# Patient Record
Sex: Female | Born: 1940
Health system: Southern US, Community
[De-identification: ages and names within clinical notes are randomized; demographics above are authoritative.]

## PROBLEM LIST (undated history)

## (undated) DIAGNOSIS — I10 Essential (primary) hypertension: Secondary | ICD-10-CM

## (undated) DIAGNOSIS — E7800 Pure hypercholesterolemia, unspecified: Secondary | ICD-10-CM

## (undated) DIAGNOSIS — I639 Cerebral infarction, unspecified: Secondary | ICD-10-CM

## (undated) DIAGNOSIS — F329 Major depressive disorder, single episode, unspecified: Secondary | ICD-10-CM

## (undated) DIAGNOSIS — F32A Depression, unspecified: Secondary | ICD-10-CM

## (undated) DIAGNOSIS — M1990 Unspecified osteoarthritis, unspecified site: Secondary | ICD-10-CM

## (undated) DIAGNOSIS — J189 Pneumonia, unspecified organism: Secondary | ICD-10-CM

## (undated) HISTORY — PX: ABDOMINAL HYSTERECTOMY: SHX81

## (undated) HISTORY — DX: Cerebral infarction, unspecified: I63.9

## (undated) HISTORY — PX: SHOULDER SURGERY: SHX246

## (undated) HISTORY — PX: TONSILLECTOMY: SUR1361

---

## 1998-06-23 ENCOUNTER — Encounter (INDEPENDENT_AMBULATORY_CARE_PROVIDER_SITE_OTHER): Payer: Self-pay | Admitting: *Deleted

## 1998-06-23 LAB — CONVERTED CEMR LAB

## 1998-10-02 ENCOUNTER — Encounter: Payer: Self-pay | Admitting: Internal Medicine

## 1998-10-02 ENCOUNTER — Inpatient Hospital Stay (HOSPITAL_COMMUNITY): Admission: EM | Admit: 1998-10-02 | Discharge: 1998-10-04 | Payer: Self-pay | Admitting: Emergency Medicine

## 1998-10-28 ENCOUNTER — Encounter: Admission: RE | Admit: 1998-10-28 | Discharge: 1998-10-28 | Payer: Self-pay | Admitting: Family Medicine

## 1998-11-25 ENCOUNTER — Encounter: Admission: RE | Admit: 1998-11-25 | Discharge: 1998-11-25 | Payer: Self-pay | Admitting: Family Medicine

## 1998-12-26 ENCOUNTER — Encounter: Admission: RE | Admit: 1998-12-26 | Discharge: 1998-12-26 | Payer: Self-pay | Admitting: Family Medicine

## 1998-12-30 ENCOUNTER — Encounter: Admission: RE | Admit: 1998-12-30 | Discharge: 1998-12-30 | Payer: Self-pay | Admitting: Sports Medicine

## 1999-03-30 ENCOUNTER — Encounter: Admission: RE | Admit: 1999-03-30 | Discharge: 1999-03-30 | Payer: Self-pay | Admitting: Sports Medicine

## 1999-04-29 ENCOUNTER — Encounter: Admission: RE | Admit: 1999-04-29 | Discharge: 1999-04-29 | Payer: Self-pay | Admitting: Family Medicine

## 1999-04-29 ENCOUNTER — Ambulatory Visit (HOSPITAL_COMMUNITY): Admission: RE | Admit: 1999-04-29 | Discharge: 1999-04-29 | Payer: Self-pay | Admitting: Family Medicine

## 1999-06-12 ENCOUNTER — Encounter: Payer: Self-pay | Admitting: Cardiovascular Disease

## 1999-06-12 ENCOUNTER — Ambulatory Visit (HOSPITAL_COMMUNITY): Admission: RE | Admit: 1999-06-12 | Discharge: 1999-06-12 | Payer: Self-pay | Admitting: Cardiovascular Disease

## 1999-06-17 ENCOUNTER — Encounter: Admission: RE | Admit: 1999-06-17 | Discharge: 1999-06-17 | Payer: Self-pay | Admitting: Family Medicine

## 1999-06-23 ENCOUNTER — Encounter: Admission: RE | Admit: 1999-06-23 | Discharge: 1999-06-23 | Payer: Self-pay | Admitting: Sports Medicine

## 1999-07-07 ENCOUNTER — Encounter: Admission: RE | Admit: 1999-07-07 | Discharge: 1999-07-07 | Payer: Self-pay | Admitting: Sports Medicine

## 1999-07-24 ENCOUNTER — Encounter: Admission: RE | Admit: 1999-07-24 | Discharge: 1999-08-17 | Payer: Self-pay | Admitting: *Deleted

## 1999-09-30 ENCOUNTER — Encounter: Admission: RE | Admit: 1999-09-30 | Discharge: 1999-09-30 | Payer: Self-pay | Admitting: Family Medicine

## 1999-10-12 ENCOUNTER — Encounter: Admission: RE | Admit: 1999-10-12 | Discharge: 1999-10-12 | Payer: Self-pay | Admitting: Family Medicine

## 1999-12-01 ENCOUNTER — Encounter: Admission: RE | Admit: 1999-12-01 | Discharge: 1999-12-01 | Payer: Self-pay | Admitting: Family Medicine

## 1999-12-14 ENCOUNTER — Ambulatory Visit (HOSPITAL_COMMUNITY): Admission: RE | Admit: 1999-12-14 | Discharge: 1999-12-14 | Payer: Self-pay | Admitting: *Deleted

## 2000-02-04 ENCOUNTER — Inpatient Hospital Stay (HOSPITAL_COMMUNITY): Admission: RE | Admit: 2000-02-04 | Discharge: 2000-02-05 | Payer: Self-pay | Admitting: *Deleted

## 2000-03-15 ENCOUNTER — Ambulatory Visit (HOSPITAL_COMMUNITY): Admission: RE | Admit: 2000-03-15 | Discharge: 2000-03-15 | Payer: Self-pay | Admitting: Family Medicine

## 2000-03-15 ENCOUNTER — Encounter: Admission: RE | Admit: 2000-03-15 | Discharge: 2000-03-15 | Payer: Self-pay | Admitting: Family Medicine

## 2000-03-23 ENCOUNTER — Encounter: Admission: RE | Admit: 2000-03-23 | Discharge: 2000-03-23 | Payer: Self-pay | Admitting: Family Medicine

## 2000-04-07 ENCOUNTER — Encounter: Admission: RE | Admit: 2000-04-07 | Discharge: 2000-04-07 | Payer: Self-pay | Admitting: Family Medicine

## 2000-12-28 ENCOUNTER — Encounter: Admission: RE | Admit: 2000-12-28 | Discharge: 2000-12-28 | Payer: Self-pay | Admitting: Family Medicine

## 2001-01-17 ENCOUNTER — Encounter: Payer: Self-pay | Admitting: Sports Medicine

## 2001-01-17 ENCOUNTER — Encounter: Admission: RE | Admit: 2001-01-17 | Discharge: 2001-01-17 | Payer: Self-pay | Admitting: Sports Medicine

## 2001-02-01 ENCOUNTER — Encounter: Admission: RE | Admit: 2001-02-01 | Discharge: 2001-02-01 | Payer: Self-pay | Admitting: Family Medicine

## 2001-02-03 ENCOUNTER — Encounter: Payer: Self-pay | Admitting: Sports Medicine

## 2001-02-03 ENCOUNTER — Encounter: Admission: RE | Admit: 2001-02-03 | Discharge: 2001-02-03 | Payer: Self-pay | Admitting: Sports Medicine

## 2001-03-10 ENCOUNTER — Encounter: Admission: RE | Admit: 2001-03-10 | Discharge: 2001-03-10 | Payer: Self-pay | Admitting: Family Medicine

## 2001-05-01 ENCOUNTER — Encounter: Admission: RE | Admit: 2001-05-01 | Discharge: 2001-05-01 | Payer: Self-pay | Admitting: Family Medicine

## 2001-05-15 ENCOUNTER — Encounter: Admission: RE | Admit: 2001-05-15 | Discharge: 2001-05-15 | Payer: Self-pay | Admitting: Sports Medicine

## 2001-07-21 ENCOUNTER — Encounter: Admission: RE | Admit: 2001-07-21 | Discharge: 2001-07-21 | Payer: Self-pay | Admitting: Family Medicine

## 2001-11-14 ENCOUNTER — Encounter: Admission: RE | Admit: 2001-11-14 | Discharge: 2001-11-14 | Payer: Self-pay | Admitting: Family Medicine

## 2001-12-18 ENCOUNTER — Encounter: Admission: RE | Admit: 2001-12-18 | Discharge: 2001-12-18 | Payer: Self-pay | Admitting: Pediatrics

## 2001-12-28 ENCOUNTER — Encounter: Admission: RE | Admit: 2001-12-28 | Discharge: 2001-12-28 | Payer: Self-pay | Admitting: Family Medicine

## 2002-03-05 ENCOUNTER — Encounter: Admission: RE | Admit: 2002-03-05 | Discharge: 2002-03-05 | Payer: Self-pay | Admitting: Family Medicine

## 2002-04-17 ENCOUNTER — Encounter: Admission: RE | Admit: 2002-04-17 | Discharge: 2002-04-17 | Payer: Self-pay | Admitting: Family Medicine

## 2002-04-24 ENCOUNTER — Encounter: Admission: RE | Admit: 2002-04-24 | Discharge: 2002-04-24 | Payer: Self-pay | Admitting: Sports Medicine

## 2002-04-24 ENCOUNTER — Encounter: Payer: Self-pay | Admitting: Sports Medicine

## 2002-05-18 ENCOUNTER — Encounter: Admission: RE | Admit: 2002-05-18 | Discharge: 2002-05-18 | Payer: Self-pay | Admitting: Family Medicine

## 2002-07-19 ENCOUNTER — Encounter: Admission: RE | Admit: 2002-07-19 | Discharge: 2002-07-19 | Payer: Self-pay | Admitting: Family Medicine

## 2002-07-23 ENCOUNTER — Encounter: Payer: Self-pay | Admitting: Sports Medicine

## 2002-07-23 ENCOUNTER — Encounter: Admission: RE | Admit: 2002-07-23 | Discharge: 2002-07-23 | Payer: Self-pay | Admitting: Sports Medicine

## 2002-07-26 ENCOUNTER — Ambulatory Visit (HOSPITAL_COMMUNITY): Admission: RE | Admit: 2002-07-26 | Discharge: 2002-07-26 | Payer: Self-pay | Admitting: Sports Medicine

## 2002-08-16 ENCOUNTER — Encounter: Admission: RE | Admit: 2002-08-16 | Discharge: 2002-08-16 | Payer: Self-pay | Admitting: Family Medicine

## 2002-10-17 ENCOUNTER — Encounter: Admission: RE | Admit: 2002-10-17 | Discharge: 2002-10-17 | Payer: Self-pay | Admitting: Family Medicine

## 2002-11-13 ENCOUNTER — Encounter: Admission: RE | Admit: 2002-11-13 | Discharge: 2002-11-13 | Payer: Self-pay | Admitting: Family Medicine

## 2002-11-19 ENCOUNTER — Encounter: Admission: RE | Admit: 2002-11-19 | Discharge: 2002-11-19 | Payer: Self-pay | Admitting: Family Medicine

## 2002-12-20 ENCOUNTER — Encounter: Admission: RE | Admit: 2002-12-20 | Discharge: 2002-12-20 | Payer: Self-pay | Admitting: Family Medicine

## 2003-01-22 ENCOUNTER — Encounter: Admission: RE | Admit: 2003-01-22 | Discharge: 2003-01-22 | Payer: Self-pay | Admitting: Sports Medicine

## 2003-03-21 ENCOUNTER — Emergency Department (HOSPITAL_COMMUNITY): Admission: EM | Admit: 2003-03-21 | Discharge: 2003-03-21 | Payer: Self-pay | Admitting: Family Medicine

## 2003-03-21 ENCOUNTER — Encounter: Admission: RE | Admit: 2003-03-21 | Discharge: 2003-03-21 | Payer: Self-pay | Admitting: Sports Medicine

## 2003-06-26 ENCOUNTER — Encounter: Admission: RE | Admit: 2003-06-26 | Discharge: 2003-06-26 | Payer: Self-pay | Admitting: Family Medicine

## 2003-07-17 ENCOUNTER — Encounter: Admission: RE | Admit: 2003-07-17 | Discharge: 2003-07-17 | Payer: Self-pay | Admitting: Sports Medicine

## 2003-09-16 ENCOUNTER — Ambulatory Visit (HOSPITAL_COMMUNITY): Admission: RE | Admit: 2003-09-16 | Discharge: 2003-09-16 | Payer: Self-pay | Admitting: Gastroenterology

## 2003-09-16 ENCOUNTER — Encounter (INDEPENDENT_AMBULATORY_CARE_PROVIDER_SITE_OTHER): Payer: Self-pay | Admitting: *Deleted

## 2003-10-16 ENCOUNTER — Encounter: Admission: RE | Admit: 2003-10-16 | Discharge: 2003-10-16 | Payer: Self-pay | Admitting: Family Medicine

## 2003-11-17 ENCOUNTER — Emergency Department (HOSPITAL_COMMUNITY): Admission: EM | Admit: 2003-11-17 | Discharge: 2003-11-17 | Payer: Self-pay | Admitting: Family Medicine

## 2004-01-27 ENCOUNTER — Ambulatory Visit: Payer: Self-pay | Admitting: Sports Medicine

## 2004-06-29 ENCOUNTER — Ambulatory Visit: Payer: Self-pay | Admitting: Family Medicine

## 2004-07-29 ENCOUNTER — Ambulatory Visit: Payer: Self-pay | Admitting: Family Medicine

## 2004-07-31 ENCOUNTER — Encounter: Admission: RE | Admit: 2004-07-31 | Discharge: 2004-07-31 | Payer: Self-pay | Admitting: Sports Medicine

## 2005-03-05 ENCOUNTER — Ambulatory Visit (HOSPITAL_COMMUNITY): Admission: RE | Admit: 2005-03-05 | Discharge: 2005-03-05 | Payer: Self-pay | Admitting: Internal Medicine

## 2005-09-02 ENCOUNTER — Encounter: Admission: RE | Admit: 2005-09-02 | Discharge: 2005-09-02 | Payer: Self-pay | Admitting: Internal Medicine

## 2005-10-22 ENCOUNTER — Encounter: Admission: RE | Admit: 2005-10-22 | Discharge: 2005-10-22 | Payer: Self-pay | Admitting: Internal Medicine

## 2006-04-21 DIAGNOSIS — M25549 Pain in joints of unspecified hand: Secondary | ICD-10-CM | POA: Insufficient documentation

## 2006-04-21 DIAGNOSIS — F339 Major depressive disorder, recurrent, unspecified: Secondary | ICD-10-CM | POA: Insufficient documentation

## 2006-04-21 DIAGNOSIS — I10 Essential (primary) hypertension: Secondary | ICD-10-CM | POA: Insufficient documentation

## 2006-04-21 DIAGNOSIS — E7800 Pure hypercholesterolemia, unspecified: Secondary | ICD-10-CM | POA: Insufficient documentation

## 2006-04-21 DIAGNOSIS — F17200 Nicotine dependence, unspecified, uncomplicated: Secondary | ICD-10-CM | POA: Insufficient documentation

## 2006-04-21 DIAGNOSIS — E669 Obesity, unspecified: Secondary | ICD-10-CM | POA: Insufficient documentation

## 2006-04-21 DIAGNOSIS — I161 Hypertensive emergency: Secondary | ICD-10-CM | POA: Insufficient documentation

## 2006-04-22 ENCOUNTER — Encounter (INDEPENDENT_AMBULATORY_CARE_PROVIDER_SITE_OTHER): Payer: Self-pay | Admitting: *Deleted

## 2007-03-24 ENCOUNTER — Ambulatory Visit: Payer: Self-pay | Admitting: Vascular Surgery

## 2007-08-15 ENCOUNTER — Emergency Department (HOSPITAL_COMMUNITY): Admission: EM | Admit: 2007-08-15 | Discharge: 2007-08-15 | Payer: Self-pay | Admitting: Emergency Medicine

## 2007-12-26 ENCOUNTER — Ambulatory Visit: Payer: Self-pay | Admitting: Vascular Surgery

## 2008-01-08 ENCOUNTER — Ambulatory Visit: Payer: Self-pay | Admitting: Vascular Surgery

## 2008-01-08 ENCOUNTER — Ambulatory Visit (HOSPITAL_COMMUNITY): Admission: RE | Admit: 2008-01-08 | Discharge: 2008-01-08 | Payer: Self-pay | Admitting: Vascular Surgery

## 2008-01-30 ENCOUNTER — Ambulatory Visit: Payer: Self-pay | Admitting: Vascular Surgery

## 2008-03-08 ENCOUNTER — Encounter: Admission: RE | Admit: 2008-03-08 | Discharge: 2008-03-08 | Payer: Self-pay | Admitting: Internal Medicine

## 2008-11-16 ENCOUNTER — Emergency Department (HOSPITAL_COMMUNITY): Admission: EM | Admit: 2008-11-16 | Discharge: 2008-11-16 | Payer: Self-pay | Admitting: Emergency Medicine

## 2008-12-09 ENCOUNTER — Emergency Department (HOSPITAL_BASED_OUTPATIENT_CLINIC_OR_DEPARTMENT_OTHER): Admission: EM | Admit: 2008-12-09 | Discharge: 2008-12-09 | Payer: Self-pay | Admitting: Emergency Medicine

## 2008-12-09 ENCOUNTER — Ambulatory Visit: Payer: Self-pay | Admitting: Radiology

## 2009-02-28 ENCOUNTER — Ambulatory Visit: Payer: Self-pay | Admitting: Vascular Surgery

## 2009-03-03 DIAGNOSIS — R7301 Impaired fasting glucose: Secondary | ICD-10-CM | POA: Insufficient documentation

## 2009-03-03 DIAGNOSIS — E785 Hyperlipidemia, unspecified: Secondary | ICD-10-CM | POA: Insufficient documentation

## 2009-03-10 ENCOUNTER — Encounter: Admission: RE | Admit: 2009-03-10 | Discharge: 2009-03-10 | Payer: Self-pay | Admitting: Internal Medicine

## 2009-10-08 ENCOUNTER — Ambulatory Visit: Payer: Self-pay | Admitting: Vascular Surgery

## 2009-10-20 ENCOUNTER — Ambulatory Visit: Payer: Self-pay | Admitting: Vascular Surgery

## 2009-10-20 ENCOUNTER — Ambulatory Visit (HOSPITAL_COMMUNITY): Admission: RE | Admit: 2009-10-20 | Discharge: 2009-10-20 | Payer: Self-pay | Admitting: Vascular Surgery

## 2009-10-20 HISTORY — PX: OTHER SURGICAL HISTORY: SHX169

## 2010-01-09 DIAGNOSIS — F319 Bipolar disorder, unspecified: Secondary | ICD-10-CM | POA: Insufficient documentation

## 2010-03-11 ENCOUNTER — Ambulatory Visit
Admission: RE | Admit: 2010-03-11 | Discharge: 2010-03-11 | Payer: Self-pay | Source: Home / Self Care | Attending: Vascular Surgery | Admitting: Vascular Surgery

## 2010-03-11 ENCOUNTER — Ambulatory Visit: Admit: 2010-03-11 | Payer: Self-pay | Admitting: Vascular Surgery

## 2010-03-12 ENCOUNTER — Encounter
Admission: RE | Admit: 2010-03-12 | Discharge: 2010-03-12 | Payer: Self-pay | Source: Home / Self Care | Attending: Internal Medicine | Admitting: Internal Medicine

## 2010-03-19 ENCOUNTER — Encounter
Admission: RE | Admit: 2010-03-19 | Discharge: 2010-03-19 | Payer: Self-pay | Source: Home / Self Care | Attending: Internal Medicine | Admitting: Internal Medicine

## 2010-03-20 NOTE — Procedures (Unsigned)
CAROTID DUPLEX EXAM  INDICATION:  Carotid artery disease.  HISTORY: Diabetes:  No Cardiac:  No Hypertension:  Yes Smoking:  Yes Previous Surgery:  No CV History:  The patient is currently asymptomatic. Amaurosis Fugax No, Paresthesias No, Hemiparesis No                                      RIGHT             LEFT Brachial systolic pressure:         139               133 Brachial Doppler waveforms:         WNL               WNL Vertebral direction of flow:        antegrade         antegrade DUPLEX VELOCITIES (cm/sec) CCA peak systolic                   55                86 ECA peak systolic                   85                79 ICA peak systolic                   73                108 ICA end diastolic                   20                35 PLAQUE MORPHOLOGY:                  heterogeneous     heterogeneous PLAQUE AMOUNT:                      moderate          moderate PLAQUE LOCATION:                    CCA to ICA and ECA                  CCA to ICA and ECA  IMPRESSION:  Bilaterally no hemodynamically significant stenosis. Calcific plaques at both proximal internal carotid arteries prevent accurate evaluation; however, waveforms and velocities just distal to acoustic shadow appear within normal limits.  ___________________________________________ Di Kindle. Edilia Bo, M.D.  OD/MEDQ  D:  03/11/2010  T:  03/11/2010  Job:  [id number]

## 2010-04-08 ENCOUNTER — Ambulatory Visit: Payer: Self-pay | Admitting: Vascular Surgery

## 2010-05-07 LAB — POCT I-STAT, CHEM 8
Chloride: 104 mEq/L (ref 96–112)
Creatinine, Ser: 0.8 mg/dL (ref 0.4–1.2)
HCT: 45 % (ref 36.0–46.0)
Hemoglobin: 15.3 g/dL — ABNORMAL HIGH (ref 12.0–15.0)
Potassium: 3.5 mEq/L (ref 3.5–5.1)
Sodium: 144 mEq/L (ref 135–145)

## 2010-05-28 LAB — URINALYSIS, ROUTINE W REFLEX MICROSCOPIC
Bilirubin Urine: NEGATIVE
Glucose, UA: NEGATIVE mg/dL
Ketones, ur: NEGATIVE mg/dL
Nitrite: NEGATIVE
Specific Gravity, Urine: 1.014 (ref 1.005–1.030)

## 2010-05-28 LAB — DIFFERENTIAL
Lymphocytes Relative: 42 % (ref 12–46)
Lymphs Abs: 2.9 10*3/uL (ref 0.7–4.0)
Monocytes Absolute: 0.4 10*3/uL (ref 0.1–1.0)
Monocytes Relative: 6 % (ref 3–12)
Neutrophils Relative %: 51 % (ref 43–77)

## 2010-05-28 LAB — POCT TOXICOLOGY PANEL

## 2010-05-28 LAB — CBC
Platelets: 290 10*3/uL (ref 150–400)
RBC: 5.35 MIL/uL — ABNORMAL HIGH (ref 3.87–5.11)

## 2010-05-28 LAB — BASIC METABOLIC PANEL
CO2: 28 mEq/L (ref 19–32)
Chloride: 105 mEq/L (ref 96–112)
GFR calc Af Amer: >60 mL/min (ref >60)
Sodium: 144 mEq/L (ref 135–145)

## 2010-06-26 DIAGNOSIS — M542 Cervicalgia: Secondary | ICD-10-CM | POA: Insufficient documentation

## 2010-07-07 NOTE — Op Note (Signed)
NAME:  [name], [name]                   ACCOUNT NO.:  [id number]   MEDICAL RECORD NO.:  [id number]          PATIENT TYPE:  AMB   LOCATION:  SDS                          FACILITY:  [facility]   PHYSICIAN:  Di Kindle. Edilia Bo, M.D.DATE OF BIRTH:  01-11-1941   DATE OF PROCEDURE:  01/08/2008  DATE OF DISCHARGE:  01/08/2008                               OPERATIVE REPORT   PREOPERATIVE DIAGNOSIS:  Bilateral lower extremity leg pain with  peripheral vascular disease.   POSTOPERATIVE DIAGNOSIS:  Bilateral lower extremity leg pain with  peripheral vascular disease.   PROCEDURES:  1. Ultrasound-guided access to the right common femoral artery.  2. Aortogram with bilateral lower extremity runoff.   SURGEON:  Di Kindle. Edilia Bo, MD   ANESTHESIA:  Local with sedation.   TECHNIQUE:  The patient was taken to the PV lab, sedated with 1/2 mg of  Versed and 25 mcg of fentanyl.  Both groins were prepped and draped in  usual sterile fashion.  After the skin was infiltrated with 1% lidocaine  and under ultrasound guidance, the right common femoral artery was  cannulated and a guidewire introduced into the infrarenal aorta under  fluoroscopic control.  A 5-French sheath was introduced over the wire.  Pigtail catheter was positioned at the L1 vertebral body and flush  aortogram obtained.  The catheter was then repositioned above the aortic  bifurcation and oblique iliac projections were obtained.  Bilateral  lower extremity runoff films were obtained.   FINDINGS:  There are single renal arteries bilaterally with no  significant renal artery stenosis identified.  There is mild diffuse  disease of the infrarenal aorta but no focal stenosis.   On the right side, the common iliac, external iliac, and hypogastric  arteries are patent.  There is some mild diffuse disease.  In the distal  right external iliac artery, there is some atherosclerotic disease  producing approximately 30% narrowing.  The right  common femoral,  superficial femoral, and deep femoral arteries are patent.  There is  some mild-to-moderate diffuse disease of the distal superficial femoral  artery and proximal popliteal artery.  The below-knee popliteal artery  is patent with 3-vessel runoff on the right via the anterior tibial,  peroneal, and posterior tibial arteries.   On the left side, there is mild diffuse disease of the common iliac and  external iliac artery but no focal stenosis.  The hypogastric artery on  the left is patent.  The common femoral artery has a moderate 30-40%  stenosis on the left.  The deep femoral artery and superficial femoral  artery are patent.  The previously area in the superficial femoral  artery has been stented and there are 2 intrastent stenoses, one of  approximately 70% and one of approximately 90%.  The below-knee  popliteal artery is patent and there is 3-vessel runoff on the left via  the anterior tibial, posterior tibial, and peroneal arteries.   CONCLUSIONS:  1. Mild aortoiliac disease but no focal stenosis identified.  2. Mild superficial femoral artery occlusive disease on the right.  3. Two intragraft stenosis  on the left.      Di Kindle. Edilia Bo, M.D.  Electronically Signed     CSD/MEDQ  D:  01/08/2008  T:  01/08/2008  Job:  [id number]   cc:   Kari Baars, M.D.

## 2010-07-07 NOTE — Assessment & Plan Note (Signed)
OFFICE VISIT   Jellison, Dalphine E  DOB:  Aug 11, 1940                                       10/08/2009  GNFAO#:[id number]   I saw the patient in the office today for continued followup of her  peripheral vascular disease.  I had last seen her in December of 2009.  She had undergone a previous PTA and stenting of the left superficial  femoral artery in 2001 by Dr. [name].  Based on her exam when I last saw  her she had evidence of multilevel arterial occlusive disease.  She  underwent an arteriogram which really did not show any focal aortoiliac  disease amenable to angioplasty.  She had some mild superficial femoral  artery occlusive disease on the right and some mild intragraft stenosis  in the previously stented left superficial femoral artery.  Her  claudication symptoms at that time were improving and we did not proceed  with any intervention.  She comes in now for a followup visit and states  that her claudication symptoms have progressed significantly especially  on the left side.  She experiences claudication in both calves  associated with ambulation and relieved with rest.  This occurs at a  very short distance simply walking around the house.  There are no other  aggravating or alleviating factors.  Her symptoms have gradually  increased over the last year.  She has had no nonhealing ulcers.  She  has had some rest pain in the left foot but no rest pain on the right.   Her past medical history is significant for hypertension and  hypercholesterolemia.  She denies any history of diabetes, history of  previous myocardial infarction, history of congestive heart failure or  history of COPD.   SOCIAL HISTORY:  She is widowed.  She has four children.  She smokes  half a pack per day of cigarettes and has been smoking for 50 years.   FAMILY HISTORY:  There is no history of premature cardiovascular  disease.   REVIEW OF SYSTEMS:  GENERAL:  She has had no  recent weight loss, weight  gain or problems with her appetite.  CARDIOVASCULAR:  She has had no chest pain, chest pressure, palpitations  or arrhythmias.  She does admit to dyspnea on exertion.  She had a  previous stroke in 1994 associated with some mild left-sided weakness.  She has had no history of DVT or phlebitis.  PULMONARY:  She has had wheezing in the past.  MUSCULOSKELETAL:  She does have muscle pain.  ENT:  She has had some loss of hearing recently.  GI, neurologic, hematologic, GU, psychiatric and integumentary review of  systems is unremarkable.   PHYSICAL EXAMINATION:  General:  This is a pleasant 70 year old woman  who appears her stated age.  Vital signs:  Blood pressure is 135/75,  heart rate is 66, saturation 97%.  She is obese.  HEENT:  Unremarkable.  Lungs:  Are clear bilaterally to auscultation without rales, rhonchi or  wheezing.  Cardiovascular:  I do not detect any carotid bruits.  She has  a regular rate and rhythm.  I can palpate a right femoral pulse although  slightly diminished.  I cannot palpate her left femoral pulse.  It is  somewhat difficult to palpate her pulses because of her size.  I cannot  palpate popliteal or  pedal pulses on either side.  She has mild  bilateral extremity swelling.  Abdomen:  Soft and nontender with normal  pitched bowel sounds.  Musculoskeletal:  There are no major deformities  or cyanosis.  Neurologic:  She has no focal weakness or paresthesias.  Skin:  There are no ulcers or rashes.   I have independently interpreted her arterial Doppler study which shows  monophasic signals in the posterior tibial and dorsalis pedis positions  bilaterally with an ABI of 55% on the right and 61% on the left.  I have  compared this to a previous study in January of this year which showed  an ABI of 78% on the right and 72% on the left.   Based on her drop in ankle brachial indices she may have either  developed some proximal aortoiliac  occlusive disease or also progressed  her superficial femoral artery occlusive disease.  Again, the pulse exam  is somewhat challenging because of her size.  Given that her symptoms  have progressed and she has now developed rest pain on the left side I  have recommended we proceed with arteriography to further assess this.  If she has iliac disease amenable to angioplasty this could potentially  be addressed at the same time.  Likewise if her intrastent stenosis on  the left has progressed this could potentially be reballooned and  stented.  I have discussed the indications for arteriography and the  potential complications including but not limited to bleeding, arterial  injury and renal insufficiency.  We have also discussed the indications  and potential complications of angioplasty including but not limited to  bleeding, arterial thrombosis or arterial injury.  All of her questions  were answered and she is agreeable to proceed.  The procedure has been  scheduled for 10/20/2009.  We will make further recommendations pending  these results.  Of note, she is on Plavix.     Di Kindle. Edilia Bo, M.D.  Electronically Signed   CSD/MEDQ  D:  10/08/2009  T:  10/09/2009  Job:  [id number]   cc:   Kari Baars, M.D.

## 2010-07-07 NOTE — Assessment & Plan Note (Signed)
OFFICE VISIT   Mccarrell, Rea E  DOB:  07-19-1940                                       03/11/2010  ZOXWR#:[id number]   I saw patient in the office today for continued follow-up of her  peripheral vascular disease and also carotid disease.  This is a  pleasant 70 year old right-handed woman with a history of peripheral  vascular disease.  I had done a PTA and stenting of a left superficial  femoral artery stenosis back in August 2011 for left lower extremity  claudication.  From that standpoint, her claudication of the left leg  has resolved, and she is doing well.  She has had no claudication or  rest pain.  With respect to her carotid disease, she was sent by Dr.  Clelia Croft for carotid evaluation.  She has a history of a stroke associated  with left hand weakness in 1994, and she states that she has had several  mini strokes involving the left arm since that time.  The most recent  episode was in 2000.  She has had no recent stroke, TIAs, expressive or  receptive aphasia, or amaurosis fugax.  She does occasionally have  problems with unsteady gait but has had no problems with dizziness.   PAST MEDICAL HISTORY:  Significant for hypertension and  hypercholesterolemia.  She denies any history of diabetes, a history of  previous myocardial infarction, or history congestive heart failure.   SOCIAL HISTORY:  She is single.  She has 3 living children.  She smokes  a 1/2 pack per day of cigarettes.   REVIEW OF SYSTEMS:  CARDIOVASCULAR:  She had no chest pain, chest  pressure, palpitations or arrhythmias.  She has had no history of DVT or  phlebitis.  PULMONARY:  She has had no productive cough, bronchitis, asthma, or  wheezing.   PHYSICAL EXAMINATION:  This is a pleasant 70 year old woman who appears  her stated age.  Her blood pressure is 162/83, heart rate is 63,  saturation 97%.  Lungs are clear bilaterally to auscultation except for  some expiratory wheezes.   Cardiovascular:  I do not detect any carotid  bruits.  She has an irregular rate and rhythm.  Because of her size, it  is difficult to feel her femoral pulses; however, she does have a  palpable dorsalis pedis pulse on the left.  I cannot palpate pulses in  the right foot; however, both feet are warm and well-perfused.  She has  no significant lower extremity swelling.  Abdomen is soft and nontender  with normal-pitched bowel sounds.  Musculoskeletal exam has no major  deformities or cyanosis.  Neurologic:  There is no focal weakness or  paresthesias.   I have independently interpreted her carotid duplex scan, which shows no  evidence of significant carotid stenosis bilaterally.  Likewise, both  vertebral arteries are patent with normally directed flow, and arm  pressures are essentially equal.   I have encouraged her to continue to walk as much as possible and  encouraged her to try get off the cigarettes.  I plan on seeing her back  in 1 year with follow-up ABIs, which I have ordered.  We will also  obtain a carotid duplex scan at that time.  If it remains unchanged,  then she probably will not need routine follow-up carotid studies.  She  is  on Plavix, which she will continue.     Di Kindle. Edilia Bo, M.D.  Electronically Signed   CSD/MEDQ  D:  03/11/2010  T:  03/11/2010  Job:  [id number]   cc:   Kari Baars, M.D.

## 2010-07-07 NOTE — Assessment & Plan Note (Signed)
OFFICE VISIT   [name], [name]  DOB:  [birth year]                                       01/30/2008  QMVHQ#:[id number]   I saw the patient for followup after her recent arteriogram.  This is a  pleasant 70 year old woman who had been referred by Dr. Clelia [name] with leg  pain.  She had had a previous PTCA and stenting of the left superficial  femoral artery December of 2001 by Dr. Chales [name].  Based on her exam she had  evidence of multilevel arterial occlusive disease and she underwent an  arteriogram for further evaluation.  This was performed on 01/08/2008.   Her arteriogram demonstrated mild aortoiliac occlusive disease  bilaterally but there was really no focal stenosis amenable to  angioplasty.  She had some mild superficial femoral artery occlusive  disease on the right in the distal thigh and some intragraft stenoses  within her previously stented superficial femoral artery on the left.  She also had some mild plaque in the left common femoral artery.  She  comes in for a followup visit.  She states that her symptoms in her legs  have actually improved somewhat.  She has a stable claudication in both  calves that is brought on by ambulation and relieved with rest.  She has  no significant rest pain.   PHYSICAL EXAMINATION:  There is no hematoma at the right groin where she  had her cath.  She has diminished femoral pulses.  She has monophasic  Doppler signals in both feet.   As her symptoms have actually improved somewhat and she has multilevel  arterial occlusive disease with no simple options for revascularization  we have simply encouraged her to stay as active as possible and try to  get on a structured walking program.  We have again discussed the  importance of tobacco cessation.  I plan on seeing her back in 1 year  with followup ABIs.  If her symptoms progressed consideration could be  given to redilating the intragraft stenosis on the left.  She is not  an  ideal candidate for bypass grafting given her obesity.  She will call  sooner if her symptoms progress.   [name]. [name], M.D.  Electronically Signed   CSD/MEDQ  D:  01/30/2008  T:  01/31/2008  Job:  [id number]   cc:   [name], M.D.

## 2010-07-07 NOTE — Assessment & Plan Note (Signed)
OFFICE VISIT   [name], [name]  DOB:  Jan 27, 1941                                       02/28/2009  ZOXWR#:[id number]   REFERRING PHYSICIAN:  Dr. Clelia Croft.   PRIMARY CARE PHYSICIAN:  Dr. Clelia Croft.   The patient returns to clinic for followup of peripheral vascular  disease.  She underwent an angiogram 01/30/2008 and then now returns for  a yearly visit.   PAST MEDICAL HISTORY:  Is significant for hypertension.  She continues  to use 1-1/2 packs of tobacco daily.   REVIEW OF SYSTEMS:  Is significant for pain in legs when walking and  some mini strokes in the past.  Depression and some hearing loss.  She  is also complaining of frequency with urination.   PHYSICAL FINDINGS:  General:  Revealed a generally well-nourished woman  in no apparent distress.  Vital signs:  Heart rate was 61, blood  pressure 145/83, O2 sat was 97%.  Temperature was 98.7.  HEENT:  NCAT,  PERRLA, EOMI.  Mucous membranes were pink and moist.  Lungs:  Clear to  auscultation.  Cardiac:  Exam revealed a regular rate and rhythm.  Abdomen:  Was soft, nontender, nondistended.  Musculoskeletal:  Exam  demonstrated no major deformities.  Skin:  The evaluation of her skin  demonstrated no ulcers or rashes.   ABIs were relatively unchanged from previous.  They demonstrated mild to  moderate arterial disease.   Her hypertension and hypercholesterolemia remain stable.  This is  followed by Dr. Clelia Croft.  Her evaluation of her vasculature by noninvasive  studies demonstrates little change.  We will ask her to return in 1 year  for repeat studies.   Wilmon Arms, PA   Larina Earthly, M.D.  Electronically Signed   KEL/MEDQ  D:  02/28/2009  T:  03/01/2009  Job:  [id number]

## 2010-07-07 NOTE — Consult Note (Signed)
VASCULAR SURGERY CONSULTATION   [name], Barbara E  DOB:  1940-07-29                                       12/26/2007  [id number]#:[id number]   The patient was referred by Dr. Martha Clan.  I saw the patient in the  office today in consultation concerning her bilateral lower extremity  pain.  She was referred by Dr. Clelia Croft.  This is a pleasant 70 year old  woman who states that she has been having pain in both legs associated  with ambulation and relieved with rest since 2000.  She experiences  significant left calf claudication and also to a lesser degree right  calf claudication essentially with ambulation and relieved with rest.  This occurs at a very short distance.  She states over the last 2-3  months her symptoms have gradually worsened.  She also has had some  right hip pain although this occurs with sitting and I think it is  unrelated to her peripheral vascular disease.  She denies any history of  rest pain and has had no history of nonhealing ulcers.  She tells me  that she can barely walk 10 yards before experiencing calf claudication,  however.  She did, according to the computer, undergo an arteriogram and  PTCA of a left superficial femoral artery stenosis back in December of  2001 by Dr. Veneda Melter.   PAST MEDICAL HISTORY:  Her past medical history is significant for  hypertension, hypercholesterolemia.  She denies any history of diabetes,  history of previous myocardial infarction, history of congestive heart  failure or history of COPD.   FAMILY HISTORY:  Her mother had coronary artery disease at approximately  68 years of age.  She had two sisters who have problems with clots.  She is unaware of any other history of premature cardiovascular disease.   SOCIAL HISTORY:  She is widowed.  She has three children.  She works as  a Agricultural consultant.  She smokes half pack per day of cigarettes and has been  smoking for 40 years.   REVIEW OF SYSTEMS AND MEDICATIONS:   Are documented on the medical  history form in her chart.  Of note, she is on Plavix.   PHYSICAL EXAMINATION:  General:  This is a pleasant 70 year old woman  who appears her stated age.  Vital signs:  Temperature is 98.4, blood  pressure 135/83, heart rate 94.  Neck:  Supple.  There is no cervical  lymphadenopathy.  I do not detect any carotid bruits.  Lungs:  Clear  bilaterally to auscultation.  Cardiac:  She has a regular rate and  rhythm.  Abdomen:  Soft and nontender.  I cannot palpate an aneurysm.  She has normal pitched bowel sounds.  She has diminished but palpable  femoral pulses bilaterally.  She is morbidly obese.  I cannot palpate  popliteal or pedal pulses on either side.  She has monophasic Doppler  signals in both feet.  She did have a Doppler study at an outlying  institution which showed an ABI of 68% on the right and 70% on the left  in the posterior tibial position bilaterally.  She has no ischemic  ulcers.  She has mild bilateral lower extremity swelling.  Neurological:  Exam is nonfocal.   Based on her Doppler waveforms from her outside study and her exam I  think she does  have evidence of bilateral iliac artery occlusive  disease.  I suspect that her superficial femoral artery and stent on the  left is also occluded.  Given that her symptoms are quite disabling and  she can walk only a very short distance before experiencing significant  calf pain I have recommended we proceed with arteriography to see what  options she might have for revascularization.  I have explained if we  find some iliac stenosis amenable to angioplasty this could potentially  be addressed at the same time.  I have discussed the indications for  arteriography and the potential complications including but not limited  to bleeding, arterial injury,.  We have also discussed the potential  complications of angioplasty including the risk of arterial injury,  bleeding or thrombosis.  All of her  questions were answered and she is  agreeable to proceed.  She will continue her Plavix right up through the  procedure.  Her arteriogram has been scheduled for November 16.   Di Kindle. Edilia Bo, M.D.  Electronically Signed  CSD/MEDQ  D:  12/26/2007  T:  12/27/2007  Job:  [id number]   cc:   Kari Baars, M.D.

## 2010-07-10 NOTE — Discharge Summary (Signed)
[place]  Patient:    [name], [name]                          MRN: [id number] Adm. Date:  [id number] Disc. Date: 02/05/00 Attending:  Veneda Melter Dictator:   Joellyn Rued, P.A.C. CC:         Talmage Nap, M.D.   Discharge Summary  DATE OF BIRTH:  Jul 15, 1940  HISTORY OF PRESENT ILLNESS:  [name] is a 70 year old black female who was referred to Dr. [name] on January 19, 2000, for evaluation of lower extremity discomfort.  She describes a two-year history of posterior calf pain in the left leg occurring with ambulation.  Minimal activity will bring severe discomfort and has severely impacted her activities of daily living. Discomfort would be relieved by rest.  She has also had a history of intermittent chest discomfort with an occasional sublingual nitroglycerin use. Catheterization was performed prior to this year, however, Dr. [name] does not have those results.  She has a history of stroke in 1993 with mild left-sided weakness, several TIAs, hypertension, obesity.  Dr. [name] performed ABIs on October 22 and on the right it was 0.95 and on the left 0.67.  Thus she was admitted for arteriogram.  LABORATORY DATA:  Sodium 144, potassium 141, BUN 10, creatinine 1.1.  PT 11.3, PTT 26.8.  H&H 41.4, 13.1, MCHC slightly low at 31.7.  Platelets 345, WBC 7.4.  HOSPITAL COURSE:  On December 12, Dr. [name] performed aortogram and lower extremity arteriogram via the right femoral artery.  According to his progress notes, this showed 100% mid distal left FSA.  This is reduced to 10% utilizing stenting.  Postprocedure, she was having significant thigh discomfort and nausea and vomiting.  Thus, she remained in the hospital overnight for observation.  By December 14, she was ambulating without difficulty, catheterization site was intact, and Dr. [name] felt that she could be discharged home.  DISCHARGE DIAGNOSIS:  Peripheral vascular disease,  claudication, status post angioplasty and stenting to the left FSA.  DISPOSITION:  She is discharged home.  DISCHARGE MEDICATIONS: 1. Plavix 75 mg q.d. 2. Norvasc 10 mg q.d. 3. Aspirin 325 mg q.d. 4. Prozac 20 mg b.i.d. 5. Amitriptyline 50 mg q.d. 6. Altace 5 mg q.d. 7. Sublingual nitroglycerin as needed.  DISCHARGE INSTRUCTIONS:  She is instructed to go by the office to pick up samples of the Altace.  She was instructed not to take both Altace and Prinivil.  She was advised no lifting, driving, sexual activity, or heavy exertion for two days.  Maintain low salt, fat, and cholesterol diet.  If she had any problems with her catheterization site, she was asked to call immediately.  She will see Lavella Hammock, P.A. in the office on January 8, at 2 p.m. for groin check and follow-up and Dr. [name] in three months.  It is noted that she should continue on her Plavix due to a TIA standpoint that this should not be discontinued at four weeks as usually done with stenting. DD:  02/05/00 TD:  02/05/00 Job: [id number]

## 2010-07-10 NOTE — Op Note (Signed)
[name]  Patient:    [name], [name]                          MRN: [id number] Proc. Date: 02/03/00 Adm. Date:  [id number] Attending:  Veneda Melter CC:         Talmage Nap, M.D., [facility]   Operative Report  PROCEDURES PERFORMED: 1. Abdominal aortogram. 2. Bilateral lower extremity angiogram. 3. Percutaneous transluminal angioplasty and stenting of the left superficial    femoral artery.  DIAGNOSIS:  Peripheral vascular disease with claudication.  HISTORY:  [name] is a 70 year old black female, who presents with a several year history of left lower extremity claudication.  Over the past several months, the pain has progressed to the point, where it is significantly impacting on her activities of daily living.  Ankle brachial indexes on December 14, 1999 were 0.95 on the right and 0.67 on the left.  She presents for lower extremity angiogram.  TECHNIQUE:  Informed consent was obtained from the patient.  She was brought to the catheterization laboratory, a 5-French sheath placed in the right femoral artery and a 5-French pigtail catheter advanced into the descending aorta.  Abdominal aortogram then performed using power injections of contrast and digital subtraction angiography.  Bilateral lower extremity angiogram was then performed, again using a pigtail catheter, power injections of contrast and digital subtraction angiography.  Initial findings are as follows: 1. Abdominal aorta is of normal caliber with mild atheromatous build-up.  The    renal arteries were single and patent bilaterally without significant    obstruction. 2. Right lower extremity.  The right iliac artery has mild narrowing of 30% in    the external segment and common femoral artery has mild irregularities as    well.  The superficial femoral artery has a long segment of disease of 50%    at the junction of the mid and distal  thirds.  The vessel the trifurcation    vessels were patent. There is 3-vessel runoff to the ankle. 3. Left lower extremity.  The left iliac artery has mild diffuse disease.  The    common femoral artery has mild irregularities as well.  The superficial    femoral artery has moderate diffuse disease in the proximal and mid    sections of 30%.  There is then a segment of severe disease at the junction    of the mid and distal third of 70-80% culminating at an occlusion of the    vessel.  The popliteal artery was then of normal caliber with mild diffuse    disease.  The trifurcation vessels were patent and extend to the ankle.  These findings were discussed with the patient, who elected to proceed with percutaneous intervention of the left femoral artery.  Using an IMA catheter, a Wholey wire was placed across the iliac bifurcation and the 5-French sheath exchanged for a 7-French ______ sheath.  Heparin was then administered maintaining an ACT of greater than 225 seconds.  A Wholey wire was then advanced within a 4 x 10 Powerflex balloon and used to probe the site of occlusion.  This proved unsuccessful and an angled glidewire introduced.  This was successful in crossing the lesion and was advanced into the true lumen of the vessel.  The Powerflex balloon could be advanced up to the site of occlusion, but not through it and it was used to  predilate the lesion at this point and inflation at 10 atmospheres for 30 seconds was performed.  There was a severe segment of disease prior to site of occlusion that required high pressures to dilate.  The balloon could now be advanced to the site of occlusion and was further used to dilate this segment at 10 atmospheres for 30 seconds.  Two additional inflations were performed in the mid section of the SFA at 10 atmospheres for 60 seconds.  Repeat angiography now showed recanalization of the vessel with excellent flow.  However, there was evidence of  dissection in two locations at the site of occlusion and another segment proximal to this.  Total lesion length was approximately 11 cm.  An 8 x 12 Smart stent was then carefully positioned in the distal left superficial femoral artery and deployed.  A 6 x 8 Optiprobe balloon was then introduced and used to ______ the distal and proximal segments of the stent at 6 atmospheres for 60 seconds.  A single inflation was then performed in the mid section of the stent at 10 atmospheres for 60 seconds.  Repeat angiography showed excellent coverage of the proximal segment of stenosis.  However, there was mild impedance of flow at the distal section of the stent extending into the native vessel that resembled uncovered dissection.  A single inflation was performed at the distal section of the stent overlapping it in unstented vessel at 6 atmospheres for 180 seconds.  Repeat angiography failed to show significant improvement in this segment of vessel and we elected to cover this with an 8 x 2 Smart stent.  The 6 x 8 Optiprobe balloon was then reintroduced and used to further dilate the newly placed stent, which was overlapped with the previously placed Smart stent at 6 atmospheres for 60 seconds.  Repeat angiography now showed significant improvement in flow and vessel lumen in the distal section of the stent extending into the native vessel.  Final angiography was then performed showing excellent flow through the SFA into the trifurcation vessel and no evidence of distal vessel damage.  The ______ sheath was brought back across the iliac bifurcation and exchanged for a short 7 sheath.  This was secured in position.  The patient was then transferred to the floor, where the sheath will be removed when ACT returns to normal.  The patient tolerated the procedure well.  FINAL RESULTS:  Successful percutaneous transluminal angioplasty and stenting of the distal left superficial femoral artery with  reduction of 100% narrowing to less than 10% with placement of an 8 x 12 Smart stent, followed by an 8 x 2 Smart stent. DD:  02/03/00 TD:  02/03/00 Job: [id number]

## 2010-07-10 NOTE — Op Note (Signed)
NAME:  [name], NESTLER                             ACCOUNT NO.:  [id number]   MEDICAL RECORD NO.:  [id number]                   PATIENT TYPE:  AMB   LOCATION:  ENDO                                 FACILITY:  [facility]   PHYSICIAN:  Petra Kuba, M.D.                 DATE OF BIRTH:  Nov 23, 1940   DATE OF PROCEDURE:  09/16/2003  DATE OF DISCHARGE:                                 OPERATIVE REPORT   PROCEDURE:  Esophagogastroduodenoscopy with biopsy.   ENDOSCOPIST:  Petra Kuba, M.D.   INDICATIONS FOR PROCEDURE:  Guaiac positivity, history of ulcer on aspirin  and Plavix.  Non-diagnostic colonoscopy.   INFORMED CONSENT:  A consent was signed after the risks, benefits, methods  and options were thoroughly discussed in the office.   MEDICINES USED ADDITIONALLY:  Demerol 10 mg and 1 mg of Versed.   DESCRIPTION OF PROCEDURE:  The video endoscope was inserted by direct  vision.  The esophagus was normal.  There was a tiny hiatal hernia. The  scope passed to the antrum where some antral erosions and a small antral  ulcer was seen, without any more stigmata.  The scope passed through a  normal pylorus into the duodenal bulb, pertinent for some minimal bulbitis;  around the C-loop to a normal second portion of the duodenum.  The scope was  withdrawn back to the bulb and a good look there ruled out ulcers in that  location.  The scope was withdrawn back to the stomach, which was evaluated  on retroflexion with good visualization, with a good look at the angularis,  cardia, fundus, lesser and greater curve, and other than the antral findings  as mentioned above, no additional findings were seen.  One biopsy of the  antrum for the CLO test was obtained to rule out Helicobacter.  The scope  was slowly withdrawn.  Again a good look at the esophagus confirmed its  normal appearance.  The scope was removed.  The patient tolerated the procedure well.  There was no obvious immediate  complication.   ENDOSCOPIC ASSESSMENT:  1. Tiny hiatal hernia.  2. Antral erosions and small ulcers, status post CLO biopsy, probably     aspirin-induced.  3. Minimal bulbitis.  4. Otherwise normal esophagogastroduodenoscopy.   PLAN:  1. Treat CLO if positive for Helicobacter.  2. Would change the long-term pump inhibitor to over-the-counter Prilosec.  3. Follow up with me in six weeks to recheck symptoms, probably guaiac and     CBC, and probably set her up for an air contrast barium enema at that     juncture.                                               Lavada Mesi  Magod, M.D.    MEM/MEDQ  D:  09/16/2003  T:  09/16/2003  Job:  [id number]   cc:   Sibyl Parr. Darrick Penna, M.D.  Fax: [phone]

## 2010-07-10 NOTE — Cardiovascular Report (Signed)
[place]  Patient:    [name], [name]                          MRN: [id number] Proc. Date: 06/12/99 Adm. Date:  [id number] Disc. Date: [id number] Attending:  Orpah Cobb S                        Cardiac Catheterization  CINE NUMBER:  [id number]  PROCEDURES:  Left heart catheterization, selective coronary angiography, left ______, descending aortography.  INDICATIONS:  This is a 70 year old black female who had symptoms of angina and abnormal stress test.  COMPLICATIONS:  None.  APPROACH:  Right femoral artery using 6-French diagnostic catheters.  HEMODYNAMIC DATA:  The left ventricular pressure is 164/15 mmHg.  The aortic pressure is 165/88 mmHg.  LEFT VENTRICULOGRAM:  The left ventriculogram was essentially unremarkable with an ejection fraction 60-65%.  DESCENDING AORTOGRAM:  The descending aortogram showed normal renal arteries with a mild distal aortic disease.  CORONARY ANATOMY:  Left main coronary artery - the left main coronary artery was unremarkable.  Left anterior descending coronary artery - the left anterior descending coronary artery had proximal luminal irregularities.  The diagonal #1, #2, and #3 vessels were small vessels.  Left circumflex coronary artery - the left circumflex coronary artery had a mild luminal irregularity.  Obtuse marginal branch #1 had a proximal 60%, but was less than 1.5 mm vessel, and obtuse marginal branch #2, #3, and #4 were very small vessels.  Right coronary artery - the right coronary artery had mild proximal disease, a distal 40% stenosis.  The posterior descending artery was unremarkable and the posterolateral branch had an 80% proximal disease.  Then, the vessel was less than 1.5 mm vessel.  Attempted PTCA of the posterolateral branch due to lack of adequate wire support and the wire not crossing the lesion, the procedure was abandoned.  IMPRESSION: 1. Multi-vessel coronary  disease. 2. Preserved left ventricular systolic function. 3. Mild distal aortic atherosclerosis.  RECOMMENDATIONS:  This patient will be treated medically for now. DD:  06/18/99 TD:  06/19/99 Job: [id number]

## 2010-07-10 NOTE — Op Note (Signed)
NAME:  [name], [name]                             ACCOUNT NO.:  [id number]   MEDICAL RECORD NO.:  [id number]                   PATIENT TYPE:  AMB   LOCATION:  ENDO                                 FACILITY:  [facility]   PHYSICIAN:  Petra Kuba, M.D.                 DATE OF BIRTH:  1940-06-05   DATE OF PROCEDURE:  09/16/2003  DATE OF DISCHARGE:                                 OPERATIVE REPORT   PROCEDURE:  Colonoscopy with polypectomy.   INDICATIONS:  Guaiac positivity, due for colonic screening.  Consent was  signed after risks, benefits, methods, and options thoroughly discussed in  the office.   MEDICINES USED:  Demerol 90 mg, Versed 9 mg.   PROCEDURE:  Rectal inspection was pertinent for external hemorrhoids, small.  Digital exam was negative.  The video pediatric adjustable colonoscope was  inserted and with lots of difficulty due to some looping and tortuosity, we  were able to advance to probably the hepatic flexure.  We thought we could  see the ileocecal valve in the distance but based on a tortuous left turn  could not advance into it.  We did try all three positions and various  abdominal pressures.  We elected to withdraw.  On insertion no abnormality  was seen except for a proximal transverse pedunculated polyp, which was  snared on withdrawal, electrocautery applied.  The polyp was suctioned  through the scope and collected in the trap.  The scope was then further  withdrawn.  No other abnormalities were seen as we slowly withdrew back to  the rectum.  Back in the rectum a few tiny hyperplastic-appearing rectal  polyps were seen and were not biopsied based on their hyperplastic  appearance.  Anorectal pull-through and retroflexion confirmed some small  hemorrhoids.  The scope was reinserted a short way up the left side of the  colon, air was suctioned, the scope removed.  The patient tolerated the  procedure fair.  There was no obvious immediate complication.   ENDOSCOPIC  DIAGNOSES:  1. Internal-external hemorrhoids.  2. Tortuous, long, looping colon.  3. Proximal transverse polyp, snared.  4. Otherwise exam probably to the level of the ileocecal valve, unable to     enter the cecum.  5. Few tiny hyperplastic-appearing rectal polyps seen, not biopsied.   PLAN:  Await pathology.  Go ahead and proceed with the EGD and if well-  tolerated, may try a regular scope after the procedure versus get an air  contrast barium enema as an outpatient.   ADDENDUM:  After the endoscopy was done, the regular adjustable scope was  inserted and we were easily able to advance back to our previous location  and again, with rolling her on her back and some abdominal pressures, could  advance around this turn and see the probably ileocecal valve in the  distance.  Unfortunately, we could not advance  any further.  No obvious  abnormality was seen on insertion.  We elected to withdraw since she was  waking up and we had already given her an additional 30 mg of Demerol and 2  mg of Versed for this part of the colon.  On quick withdrawal, no additional  findings were seen.  The scope was removed.  The patient tolerated the  procedure well.  There was no obvious immediate complication.   ADDITIONAL PLAN:  Probably get an air contrast barium enema on follow-up and  then determine future colonic screening based on these findings.  See EGD  dictation for further workup and plans, recommendations.                                               Petra Kuba, M.D.    MEM/MEDQ  D:  09/16/2003  T:  09/16/2003  Job:  [id number]   cc:   Sibyl Parr. Darrick Penna, M.D.  Fax: [phone]

## 2010-11-24 LAB — POCT I-STAT, CHEM 8
Chloride: 104
HCT: 46

## 2011-02-08 ENCOUNTER — Other Ambulatory Visit: Payer: Self-pay | Admitting: Internal Medicine

## 2011-02-08 DIAGNOSIS — Z1231 Encounter for screening mammogram for malignant neoplasm of breast: Secondary | ICD-10-CM

## 2011-03-22 ENCOUNTER — Ambulatory Visit
Admission: RE | Admit: 2011-03-22 | Discharge: 2011-03-22 | Disposition: A | Discharge status: Home / Self Care | Payer: Medicare Other | Source: Ambulatory Visit | Attending: Internal Medicine | Admitting: Internal Medicine

## 2011-03-22 DIAGNOSIS — Z1231 Encounter for screening mammogram for malignant neoplasm of breast: Secondary | ICD-10-CM

## 2011-07-28 ENCOUNTER — Emergency Department (HOSPITAL_COMMUNITY): Payer: Medicare Other

## 2011-07-28 ENCOUNTER — Emergency Department (HOSPITAL_COMMUNITY)
Admission: EM | Admit: 2011-07-28 | Discharge: 2011-07-29 | Disposition: A | Discharge status: Home / Self Care | Payer: Medicare Other | Attending: Emergency Medicine | Admitting: Emergency Medicine

## 2011-07-28 ENCOUNTER — Encounter (HOSPITAL_COMMUNITY): Payer: Self-pay | Admitting: Emergency Medicine

## 2011-07-28 DIAGNOSIS — Y92009 Unspecified place in unspecified non-institutional (private) residence as the place of occurrence of the external cause: Secondary | ICD-10-CM | POA: Insufficient documentation

## 2011-07-28 DIAGNOSIS — F3289 Other specified depressive episodes: Secondary | ICD-10-CM | POA: Insufficient documentation

## 2011-07-28 DIAGNOSIS — Z79899 Other long term (current) drug therapy: Secondary | ICD-10-CM | POA: Insufficient documentation

## 2011-07-28 DIAGNOSIS — F329 Major depressive disorder, single episode, unspecified: Secondary | ICD-10-CM | POA: Insufficient documentation

## 2011-07-28 DIAGNOSIS — W268XXA Contact with other sharp object(s), not elsewhere classified, initial encounter: Secondary | ICD-10-CM | POA: Insufficient documentation

## 2011-07-28 DIAGNOSIS — IMO0002 Reserved for concepts with insufficient information to code with codable children: Secondary | ICD-10-CM

## 2011-07-28 DIAGNOSIS — S61209A Unspecified open wound of unspecified finger without damage to nail, initial encounter: Secondary | ICD-10-CM | POA: Insufficient documentation

## 2011-07-28 DIAGNOSIS — I10 Essential (primary) hypertension: Secondary | ICD-10-CM | POA: Insufficient documentation

## 2011-07-28 DIAGNOSIS — E7800 Pure hypercholesterolemia, unspecified: Secondary | ICD-10-CM | POA: Insufficient documentation

## 2011-07-28 HISTORY — DX: Essential (primary) hypertension: I10

## 2011-07-28 HISTORY — DX: Depression, unspecified: F32.A

## 2011-07-28 HISTORY — DX: Major depressive disorder, single episode, unspecified: F32.9

## 2011-07-28 HISTORY — DX: Pure hypercholesterolemia, unspecified: E78.00

## 2011-07-28 NOTE — ED Notes (Signed)
Laceration across distal end of R 3rd digit x 30 min.  States she was using scissors to get a paper jam fixed in a shredder.  Still bleeding on arrival.

## 2011-07-29 NOTE — ED Provider Notes (Signed)
Medical screening examination/treatment/procedure(s) were performed by non-physician practitioner and as supervising physician I was immediately available for consultation/collaboration.  Olivia Mackie, MD 07/29/11 [phone]

## 2011-07-29 NOTE — ED Notes (Signed)
Pt. States "I was trying to fix my paper shredder with a pair of scissors and my finger got caught between the shredder and scissors".  Laceration on anterior third digit on left hand.

## 2011-07-29 NOTE — ED Provider Notes (Signed)
History     CSN: [id number]  Arrival date & time 07/28/11  2212   None     Chief Complaint  Patient presents with  . Extremity Laceration    (Consider location/radiation/quality/duration/timing/severity/associated sxs/prior treatment) HPI Comments: Patient was trying to clear out of gym from home shredded.  Her when her finger got caught in the mechanism.  She now has a laceration to the distal tip of her third right finger  The history is provided by the patient.    Past Medical History  Diagnosis Date  . Hypertension   . High cholesterol   . Depression     Past Surgical History  Procedure Date  . Abdominal hysterectomy     No family history on file.  History  Substance Use Topics  . Smoking status: Current Everyday Smoker  . Smokeless tobacco: Not on file  . Alcohol Use: Yes    OB History    Grav Para Term Preterm Abortions TAB SAB Ect Mult Living                  Review of Systems  Constitutional: Negative for fever.  Gastrointestinal: Negative for nausea.  Skin: Positive for wound.  Neurological: Negative for dizziness.    Allergies  Penicillins  Home Medications   Current Outpatient Rx  Name Route Sig Dispense Refill  . AMLODIPINE BESYLATE 10 MG PO TABS Oral Take 10 mg by mouth daily.    Marland Kitchen REFRESH P.M. OP OINT Both Eyes Place 1 drop into both eyes at bedtime.    . CLOPIDOGREL BISULFATE 75 MG PO TABS Oral Take 75 mg by mouth daily.    Marland Kitchen EZETIMIBE-SIMVASTATIN 10-40 MG PO TABS Oral Take 1 tablet by mouth at bedtime.    Marland Kitchen HYDROCHLOROTHIAZIDE 12.5 MG PO CAPS Oral Take 12.5 mg by mouth daily.    Marland Kitchen LISINOPRIL 40 MG PO TABS Oral Take 40 mg by mouth daily.    . TOBRAMYCIN-DEXAMETHASONE 0.3-0.1 % OP SUSP Both Eyes Place 1 drop into both eyes every 4 (four) hours while awake.    . VENLAFAXINE HCL 75 MG PO TABS Oral Take 75 mg by mouth 3 (three) times daily.      BP 128/74  Pulse 63  Temp(Src) 98 F (36.7 C) (Oral)  Resp 20  SpO2 94%  Physical  Exam  Constitutional: She appears well-developed.  HENT:  Head: Normocephalic.  Eyes: Pupils are equal, round, and reactive to light.  Cardiovascular: Normal rate.   Pulmonary/Chest: Effort normal.  Musculoskeletal: Normal range of motion.  Skin: Skin is warm.       History laceration to the palmar surface of the distal right third finger    ED Course  LACERATION REPAIR Date/Time: 07/29/2011 1:29 AM Performed by: Arman Filter Authorized by: Arman Filter Consent: Verbal consent obtained. Risks and benefits: risks, benefits and alternatives were discussed Consent given by: patient Patient understanding: patient states understanding of the procedure being performed Patient identity confirmed: verbally with patient Time out: Immediately prior to procedure a "time out" was called to verify the correct patient, procedure, equipment, support staff and site/side marked as required. Body area: upper extremity Location details: right long finger Laceration length: 1 cm Foreign bodies: metal Tendon involvement: none Nerve involvement: none Anesthesia: local infiltration Local anesthetic: lidocaine 1% without epinephrine Anesthetic total: 1 ml Patient sedated: no Preparation: Patient was prepped and draped in the usual sterile fashion. Irrigation solution: saline Amount of cleaning: standard Debridement: none Degree of undermining:  none Skin closure: 4-0 Prolene Number of sutures: 6 Technique: simple Approximation difficulty: complex Dressing: antibiotic ointment Patient tolerance: Patient tolerated the procedure well with no immediate complications. Comments: This is a complex laceration.  Due to the many jagged edges   (including critical care time)  Labs Reviewed - No data to display Dg Hand Complete Right  07/28/2011  *RADIOLOGY REPORT*  Clinical Data: Laceration.  RIGHT HAND - COMPLETE 3+ VIEW  Comparison: None  Findings: There is a soft tissue injury involving the middle  finger distally.  No fracture or radiopaque foreign body.  Mild degenerative changes are noted.  IMPRESSION: No acute fracture or radiopaque foreign body.  Original Report Authenticated By: P. Loralie Champagne, M.D.     1. Laceration       MDM   Complex laceration, do, to the jagged edges of the wound.  No vascular or tendon or nerve involvement        Arman Filter, NP 07/29/11 0131  Arman Filter, NP 07/29/11 0131

## 2011-07-29 NOTE — Discharge Instructions (Signed)
Laceration Care, Adult A laceration is a cut that goes through all layers of the skin. The cut goes into the tissue beneath the skin. HOME CARE For stitches (sutures) or staples:  Keep the cut clean and dry.   If you have a bandage (dressing), change it at least once a day. Change the bandage if it gets wet or dirty, or as told by your doctor.   Wash the cut with soap and water 2 times a day. Rinse the cut with water. Pat it dry with a clean towel.   Put a thin layer of medicated cream on the cut as told by your doctor.   You may shower after the first 24 hours. Do not soak the cut in water until the stitches are removed.   Only take medicines as told by your doctor.   Have your stitches or staples removed as told by your doctor.  For skin adhesive strips:  Keep the cut clean and dry.   Do not get the strips wet. You may take a bath, but be careful to keep the cut dry.   If the cut gets wet, pat it dry with a clean towel.   The strips will fall off on their own. Do not remove the strips that are still stuck to the cut.  For wound glue:  You may shower or take baths. Do not soak or scrub the cut. Do not swim. Avoid heavy sweating until the glue falls off on its own. After a shower or bath, pat the cut dry with a clean towel.   Do not put medicine on your cut until the glue falls off.   If you have a bandage, do not put tape over the glue.   Avoid lots of sunlight or tanning lamps until the glue falls off. Put sunscreen on the cut for the first year to reduce your scar.   The glue will fall off on its own. Do not pick at the glue.  You may need a tetanus shot if:  You cannot remember when you had your last tetanus shot.   You have never had a tetanus shot.  If you need a tetanus shot and you choose not to have one, you may get tetanus. Sickness from tetanus can be serious. GET HELP RIGHT AWAY IF:   Your pain does not get better with medicine.   Your arm, hand, leg, or  foot loses feeling (numbness) or changes color.   Your cut is bleeding.   Your joint feels weak, or you cannot use your joint.   You have painful lumps on your body.   Your cut is red, puffy (swollen), or painful.   You have a red line on the skin near the cut.   You have yellowish-white fluid (pus) coming from the cut.   You have a fever.   You have a bad smell coming from the cut or bandage.   Your cut breaks open before or after stitches are removed.   You notice something coming out of the cut, such as wood or glass.   You cannot move a finger or toe.  MAKE SURE YOU:   Understand these instructions.   Will watch your condition.   Will get help right away if you are not doing well or get worse.  Document Released: 07/28/2007 Document Revised: 01/28/2011 Document Reviewed: 08/04/2010 [facility] Patient Information 2012 [place], [place]. Your sutures should be removed in 10 days

## 2011-10-12 ENCOUNTER — Other Ambulatory Visit: Payer: Self-pay

## 2011-10-12 DIAGNOSIS — I739 Peripheral vascular disease, unspecified: Secondary | ICD-10-CM

## 2011-10-12 DIAGNOSIS — R55 Syncope and collapse: Secondary | ICD-10-CM

## 2011-11-03 ENCOUNTER — Other Ambulatory Visit: Payer: Medicare Other

## 2011-11-03 ENCOUNTER — Ambulatory Visit: Payer: Medicare Other | Admitting: Vascular Surgery

## 2011-11-23 ENCOUNTER — Encounter: Payer: Self-pay | Admitting: Vascular Surgery

## 2011-11-24 ENCOUNTER — Ambulatory Visit (INDEPENDENT_AMBULATORY_CARE_PROVIDER_SITE_OTHER): Payer: Medicare Other | Admitting: Vascular Surgery

## 2011-11-24 ENCOUNTER — Encounter (INDEPENDENT_AMBULATORY_CARE_PROVIDER_SITE_OTHER): Payer: Medicare Other | Admitting: *Deleted

## 2011-11-24 ENCOUNTER — Encounter: Payer: Self-pay | Admitting: Vascular Surgery

## 2011-11-24 ENCOUNTER — Other Ambulatory Visit (INDEPENDENT_AMBULATORY_CARE_PROVIDER_SITE_OTHER): Payer: Medicare Other | Admitting: *Deleted

## 2011-11-24 VITALS — BP 139/67 | HR 69 | Resp 12 | Ht 64.0 in | Wt 217.0 lb

## 2011-11-24 DIAGNOSIS — I739 Peripheral vascular disease, unspecified: Secondary | ICD-10-CM

## 2011-11-24 DIAGNOSIS — R55 Syncope and collapse: Secondary | ICD-10-CM

## 2011-11-24 DIAGNOSIS — I6529 Occlusion and stenosis of unspecified carotid artery: Secondary | ICD-10-CM

## 2011-11-24 DIAGNOSIS — Z48812 Encounter for surgical aftercare following surgery on the circulatory system: Secondary | ICD-10-CM

## 2011-11-24 NOTE — Assessment & Plan Note (Signed)
Carotid duplex scan shows no evidence of significant carotid disease. She is asymptomatic. She'll need a follow up carotid duplex scan in 18 months. I can arrange that when she comes in 6 months for her follow up ABIs. He knows to continue taking her aspirin.

## 2011-11-24 NOTE — Progress Notes (Signed)
[facility] [place]  Patient name: [name] MRN: [id number] DOB: 1940-08-27 Sex: female  REASON FOR VISIT: follow up of peripheral vascular disease.  HPI: [name] is a 71 y.o. female with known peripheral vascular disease. In 2011 she had PT and stenting of 2 superficial femoral artery stenoses on the left. She had claudication preoperatively and her symptoms improved significantly after that. She has developed recurrent claudication symptoms in the left leg. She experiences pain in her calf associated with ambulation which is relieved with rest. Her symptoms have been gradually progressing. She has no significant symptoms on the right leg. Denies any history of rest pain or nonhealing ulcers.  She has no history of stroke, TIAs, expressive or receptive aphasia, or amaurosis fugax.  Past Medical History  Diagnosis Date  . Hypertension   . High cholesterol   . Depression     History reviewed. No pertinent family history.  SOCIAL HISTORY: History  Substance Use Topics  . Smoking status: Current Every Day Smoker -- 0.5 packs/day    Types: Cigarettes  . Smokeless tobacco: Never Used  . Alcohol Use: No    Allergies  Allergen Reactions  . Penicillins     Swelling     Current Outpatient Prescriptions  Medication Sig Dispense Refill  . amLODipine (NORVASC) 10 MG tablet Take 10 mg by mouth daily.      . Calcium Carbonate-Vitamin D (CALCIUM 600+D HIGH POTENCY PO) Take by mouth daily.      . clopidogrel (PLAVIX) 75 MG tablet Take 75 mg by mouth daily.      Marland Kitchen ezetimibe-simvastatin (VYTORIN) 10-40 MG per tablet Take 1 tablet by mouth at bedtime.      . hydrochlorothiazide (MICROZIDE) 12.5 MG capsule Take 12.5 mg by mouth daily.      Marland Kitchen lisinopril (PRINIVIL,ZESTRIL) 40 MG tablet Take 40 mg by mouth daily.      . metoprolol (LOPRESSOR) 100 MG tablet Take 100 mg by mouth 2 (two) times daily.      Marland Kitchen tobramycin-dexamethasone (TOBRADEX) ophthalmic solution Place 1  drop into both eyes every 4 (four) hours while awake.      . venlafaxine (EFFEXOR) 75 MG tablet Take 75 mg by mouth 3 (three) times daily.      . Artificial Tear Ointment (ARTIFICIAL TEARS) ointment Place 1 drop into both eyes at bedtime.        REVIEW OF SYSTEMS: Arly.Keller ] denotes positive finding; [  ] denotes negative finding  CARDIOVASCULAR:  [ ]  chest pain   [ ]  chest pressure   [ ]  palpitations   [ ]  orthopnea   [ ]  dyspnea on exertion   Arly.Keller ] claudication   [ ]  rest pain   [ ]  DVT   [ ]  phlebitis PULMONARY:   [ ]  productive cough   [ ]  asthma   [ ]  wheezing NEUROLOGIC:   [ ]  weakness  [ ]  paresthesias  [ ]  aphasia  [ ]  amaurosis  [ ]  dizziness HEMATOLOGIC:   [ ]  bleeding problems   [ ]  clotting disorders MUSCULOSKELETAL:  [ ]  joint pain   [ ]  joint swelling Arly.Keller ] leg swelling GASTROINTESTINAL: [ ]   blood in stool  [ ]   hematemesis GENITOURINARY:  Arly.Keller ]  dysuria  [ ]   hematuria PSYCHIATRIC:  Arly.Keller ] history of major depression INTEGUMENTARY:  [ ]  rashes  [ ]  ulcers CONSTITUTIONAL:  [ ]  fever   [ ]  chills  PHYSICAL  EXAM: Filed Vitals:   11/24/11 1517 11/24/11 1525  BP: 148/67 139/67  Pulse: 68 69  Resp:  12  Height: 5\' 4"  (1.626 m)   Weight: 217 lb (98.431 kg)   SpO2: 100%    Body mass index is 37.25 kg/(m^2). GENERAL: The patient is a well-nourished female, in no acute distress. The vital signs are documented above. CARDIOVASCULAR: There is a regular rate and rhythm. I do not detect carotid bruits. She has slightly diminished femoral pulses. I cannot palpate pedal pulses. She has mild left lower extremity swelling. PULMONARY: There is good air exchange bilaterally without wheezing or rales. ABDOMEN: Soft and non-tender with normal pitched bowel sounds.  MUSCULOSKELETAL: There are no major deformities or cyanosis. NEUROLOGIC: No focal weakness or paresthesias are detected. SKIN: There are no ulcers or rashes noted. PSYCHIATRIC: The patient has a normal affect.  DATA:  I have  independently interpreted her carotid duplex scan which shows no significant carotid disease bilaterally. Both vertebral arteries are patent with normally directed flow. Her arm pressures are equal.  I have also independently interpreted her arterial Doppler study shows an ABI of 92% on the left and 73% on the right. She had barely biphasic signals on the left although by my exam I could only obtain monophasic signals in the left foot. She had monophasic signals on the right.  MEDICAL ISSUES:  Peripheral vascular disease, unspecified This patient has left lower extremity claudication. Currently her symptoms are tolerable. She has undergone previous PTA and stenting of the left superficial femoral artery in 2011 and likely has developed some recurrent disease. Unfortunately she does continue to smoke. We have discussed the option of considering arteriography and potential he angioplasty and stenting of any recurrent disease on the left. She is agreeable to continue to try to get off cigarettes and to do as much walking as possible. I'll plan on seeing her back in 6 months. If her symptoms progress we could restudy her. She'll call sooner if she has problems.  Occlusion and stenosis of carotid artery without mention of cerebral infarction Carotid duplex scan shows no evidence of significant carotid disease. She is asymptomatic. She'll need a follow up carotid duplex scan in 18 months. I can arrange that when she comes in 6 months for her follow up ABIs. He knows to continue taking her aspirin.     DICKSON,CHRISTOPHER S [facility] [place] Beeper: [phone]

## 2011-11-24 NOTE — Assessment & Plan Note (Signed)
This patient has left lower extremity claudication. Currently her symptoms are tolerable. She has undergone previous PTA and stenting of the left superficial femoral artery in 2011 and likely has developed some recurrent disease. Unfortunately she does continue to smoke. We have discussed the option of considering arteriography and potential he angioplasty and stenting of any recurrent disease on the left. She is agreeable to continue to try to get off cigarettes and to do as much walking as possible. I'll plan on seeing her back in 6 months. If her symptoms progress we could restudy her. She'll call sooner if she has problems.

## 2012-01-19 ENCOUNTER — Other Ambulatory Visit: Payer: Self-pay | Admitting: *Deleted

## 2012-01-19 DIAGNOSIS — I739 Peripheral vascular disease, unspecified: Secondary | ICD-10-CM

## 2012-01-19 DIAGNOSIS — I6529 Occlusion and stenosis of unspecified carotid artery: Secondary | ICD-10-CM

## 2012-01-19 DIAGNOSIS — Z48812 Encounter for surgical aftercare following surgery on the circulatory system: Secondary | ICD-10-CM

## 2012-02-28 ENCOUNTER — Other Ambulatory Visit: Payer: Self-pay | Admitting: Internal Medicine

## 2012-02-28 DIAGNOSIS — Z1231 Encounter for screening mammogram for malignant neoplasm of breast: Secondary | ICD-10-CM

## 2012-03-23 ENCOUNTER — Ambulatory Visit
Admission: RE | Admit: 2012-03-23 | Discharge: 2012-03-23 | Disposition: A | Discharge status: Home / Self Care | Payer: Medicare Other | Source: Ambulatory Visit | Attending: Internal Medicine | Admitting: Internal Medicine

## 2012-03-23 ENCOUNTER — Ambulatory Visit: Payer: Medicare Other

## 2012-03-23 DIAGNOSIS — Z1231 Encounter for screening mammogram for malignant neoplasm of breast: Secondary | ICD-10-CM

## 2012-05-23 ENCOUNTER — Encounter: Payer: Self-pay | Admitting: Vascular Surgery

## 2012-05-24 ENCOUNTER — Ambulatory Visit (INDEPENDENT_AMBULATORY_CARE_PROVIDER_SITE_OTHER): Payer: Medicare HMO | Admitting: Vascular Surgery

## 2012-05-24 ENCOUNTER — Encounter: Payer: Self-pay | Admitting: Vascular Surgery

## 2012-05-24 VITALS — BP 144/68 | HR 76 | Ht 64.0 in | Wt 219.0 lb

## 2012-05-24 DIAGNOSIS — Z48812 Encounter for surgical aftercare following surgery on the circulatory system: Secondary | ICD-10-CM

## 2012-05-24 DIAGNOSIS — I739 Peripheral vascular disease, unspecified: Secondary | ICD-10-CM

## 2012-05-24 NOTE — Progress Notes (Signed)
Ankle brachial index performed @[facility]  05/24/2012

## 2012-05-24 NOTE — Progress Notes (Signed)
[facility] [place]  Patient name: [name] MRN: [id number] DOB: 01/03/1941 Sex: female  REASON FOR VISIT: follow up of peripheral vascular disease.  HPI: [name] is a 72 y.o. female who I been following with bilateral infrainguinal arterial occlusive disease. She comes in for a routine follow up visit. She continues to have claudication in both calves which occurs a short distance. Her symptoms are brought on by ambulation and relieved with rest. Her symptoms are more significant on the left side. There are no other aggravating or alleviating factors. She denies any history of rest pain or history of nonhealing ulcers.  She has a history of hypertension and hyperlipidemia which are currently stable and her current medications. She denies any history of diabetes.   REVIEW OF SYSTEMS: Arly.Keller ] denotes positive finding; [  ] denotes negative finding  CARDIOVASCULAR:  [ ]  chest pain   [ ]  dyspnea on exertion    CONSTITUTIONAL:  [ ]  fever   [ ]  chills  PHYSICAL EXAM: Filed Vitals:   05/24/12 1606  BP: 144/68  Pulse: 76  Height: 5\' 4"  (1.626 m)  Weight: 219 lb (99.338 kg)  SpO2: 98%   Body mass index is 37.57 kg/(m^2). GENERAL: The patient is a well-nourished female, in no acute distress. The vital signs are documented above. CARDIOVASCULAR: There is a regular rate and rhythm. He has palpable femoral pulses. I cannot palpate popliteal or pedal pulses. Both feet appear adequately perfused. She has no significant lower extremity swelling. PULMONARY: There is good air exchange bilaterally without wheezing or rales. NEURO: She has no focal weakness or paresthesias.  I have independently interpreted her arterial Doppler study which shows monophasic Doppler signals in both feet with an ABI of 53% on the right and 67% on the left. These are down slightly compared to her previous study in October of 2013.  MEDICAL ISSUES: Given that her symptoms are stable. I would not  recommend arteriography and consideration for revascularization unless she developed rest pain or nonhealing ulcer. I've encouraged her to stay as active as possible. I've also discussed with him the importance of tobacco cessation.I have discussed with the patient the nature of atherosclerosis, and emphasized the importance of maximal medical management including control of blood pressure and cholesterol levels, antiplatelet agents, obtaining regular exercise, and cessation of smoking. The patient is aware that without maximal medical management the underlying atherosclerotic disease process will progress and also limit the benefit of any interventions. I've ordered follow up ABIs in one year now see her back at that time. She knows to call sooner if she has problems.  Faiz Weber S [facility] [place] Beeper: [phone]

## 2012-05-31 ENCOUNTER — Other Ambulatory Visit: Payer: Self-pay | Admitting: *Deleted

## 2012-05-31 DIAGNOSIS — I739 Peripheral vascular disease, unspecified: Secondary | ICD-10-CM

## 2013-01-22 ENCOUNTER — Ambulatory Visit (INDEPENDENT_AMBULATORY_CARE_PROVIDER_SITE_OTHER): Payer: Medicare HMO | Admitting: Podiatry

## 2013-01-22 ENCOUNTER — Encounter: Payer: Self-pay | Admitting: Podiatry

## 2013-01-22 VITALS — BP 158/94 | HR 64 | Resp 12

## 2013-01-22 DIAGNOSIS — M79609 Pain in unspecified limb: Secondary | ICD-10-CM

## 2013-01-22 DIAGNOSIS — B351 Tinea unguium: Secondary | ICD-10-CM

## 2013-01-22 DIAGNOSIS — M779 Enthesopathy, unspecified: Secondary | ICD-10-CM

## 2013-01-22 MED ORDER — TRIAMCINOLONE ACETONIDE 10 MG/ML IJ SUSP
10.0000 mg | Freq: Once | INTRAMUSCULAR | Status: AC
  Administered 2013-01-22: 10 mg

## 2013-01-22 NOTE — Progress Notes (Signed)
Subjective:     Patient ID: MALIEA GRANDMAISON, female   DOB: [birth year], 72 y.o.   MRN: [id number]  HPI patient states that my nails are thick 1-5 both feet and that is lesion underneath my right fifth metatarsal is becoming increasingly tender with fluid buildup. States she has not seen her circulation Dr. for almost one year   Review of Systems     Objective:   Physical Exam    diminished pulses DP and PT of both feet with inflammation and fluid buildup around fifth MPJ right and nail disease with thickness and discomfort 1-5 both feet Assessment:     Inflammatory capsulitis with keratotic lesions sub-5 right and mycotic nail infection with pain 1-5 both feet with possibilities for circulatory disease    Plan:     Discussed condition and treatment options available today I did an injection of the right fifth MPJ 3 mg Kenalog 5 mg Xylocaine Marcaine mixture and do deep debridement of lesion followed by debridement of nailbeds 1-5 both feet with no iatrogenic bleeding noted. I'm sending for evaluation of circulation with consideration of surgery if circulation is adequate for healing fifth MPJ right

## 2013-04-23 ENCOUNTER — Encounter: Payer: Self-pay | Admitting: Podiatry

## 2013-04-23 ENCOUNTER — Ambulatory Visit (INDEPENDENT_AMBULATORY_CARE_PROVIDER_SITE_OTHER): Payer: Medicare HMO | Admitting: Podiatry

## 2013-04-23 VITALS — BP 146/79 | HR 70 | Resp 12

## 2013-04-23 DIAGNOSIS — M79609 Pain in unspecified limb: Secondary | ICD-10-CM

## 2013-04-23 DIAGNOSIS — M779 Enthesopathy, unspecified: Secondary | ICD-10-CM

## 2013-04-23 DIAGNOSIS — B351 Tinea unguium: Secondary | ICD-10-CM

## 2013-04-23 MED ORDER — TRIAMCINOLONE ACETONIDE 10 MG/ML IJ SUSP
10.0000 mg | Freq: Once | INTRAMUSCULAR | Status: AC
  Administered 2013-04-23: 10 mg

## 2013-04-24 NOTE — Progress Notes (Signed)
Subjective:     Patient ID: Micah Noelva E Wilkie, female   DOB: [birth year], 73 y.o.   MRN: [id number]  HPI patient presents with pain in the right fourth toe with inflammation and nail disease 1-5 of both feet that are thick and hard for her to cut and painful   Review of Systems     Objective:   Physical Exam Neurovascular status unchanged with patient well oriented x3 and inflammation fourth toe right with fluid buildup around the interphalangeal joint and nail disease 1-5 of both feet    Assessment:     Capsulitis right fourth toe with inflammation keratotic lesion and nail disease with thickness 1-5 of both feet    Plan:     Injected the interphalangeal joint two milligrams dexamethasone Kenalog Xylocaine combination and debrided nailbeds 1-5 both feet with no bleeding noted. Reappoint for routine care

## 2013-04-30 ENCOUNTER — Other Ambulatory Visit: Payer: Self-pay

## 2013-04-30 DIAGNOSIS — N3946 Mixed incontinence: Secondary | ICD-10-CM | POA: Insufficient documentation

## 2013-04-30 DIAGNOSIS — Z1231 Encounter for screening mammogram for malignant neoplasm of breast: Secondary | ICD-10-CM

## 2013-05-10 ENCOUNTER — Ambulatory Visit
Admission: RE | Admit: 2013-05-10 | Discharge: 2013-05-10 | Disposition: A | Discharge status: Home / Self Care | Payer: Commercial Managed Care - HMO | Source: Ambulatory Visit

## 2013-05-10 DIAGNOSIS — Z1231 Encounter for screening mammogram for malignant neoplasm of breast: Secondary | ICD-10-CM

## 2013-06-12 ENCOUNTER — Encounter: Payer: Self-pay | Admitting: Vascular Surgery

## 2013-06-13 ENCOUNTER — Ambulatory Visit (INDEPENDENT_AMBULATORY_CARE_PROVIDER_SITE_OTHER)
Admission: RE | Admit: 2013-06-13 | Discharge: 2013-06-13 | Disposition: A | Discharge status: Home / Self Care | Payer: Medicare HMO | Source: Ambulatory Visit | Attending: Vascular Surgery | Admitting: Vascular Surgery

## 2013-06-13 ENCOUNTER — Ambulatory Visit: Payer: Medicare Other | Admitting: Neurosurgery

## 2013-06-13 ENCOUNTER — Other Ambulatory Visit: Payer: Medicare Other

## 2013-06-13 ENCOUNTER — Ambulatory Visit (HOSPITAL_COMMUNITY)
Admission: RE | Admit: 2013-06-13 | Discharge: 2013-06-13 | Disposition: A | Discharge status: Home / Self Care | Payer: Medicare HMO | Source: Ambulatory Visit | Attending: Vascular Surgery | Admitting: Vascular Surgery

## 2013-06-13 ENCOUNTER — Encounter: Payer: Self-pay | Admitting: Vascular Surgery

## 2013-06-13 ENCOUNTER — Ambulatory Visit (INDEPENDENT_AMBULATORY_CARE_PROVIDER_SITE_OTHER): Payer: Commercial Managed Care - HMO | Admitting: Vascular Surgery

## 2013-06-13 VITALS — BP 147/72 | HR 51 | Ht 64.0 in | Wt 215.5 lb

## 2013-06-13 DIAGNOSIS — I6529 Occlusion and stenosis of unspecified carotid artery: Secondary | ICD-10-CM | POA: Insufficient documentation

## 2013-06-13 DIAGNOSIS — I739 Peripheral vascular disease, unspecified: Secondary | ICD-10-CM

## 2013-06-13 NOTE — Progress Notes (Signed)
[facility] [place]  Patient name: [name] MRN: [id number] DOB: Jul 11, 1940 Sex: female  REASON FOR VISIT: Follow up of peripheral vascular disease.  HPI: [name] is a 73 y.o. female who has undergone previous angioplasty and stenting of a left superficial femoral artery stenosis in 2011. She comes in for her yearly follow up visit. Since I saw her last she does occasionally have some right calf claudication at approximately 2 blocks. He symptoms have been stable. She has no symptoms in her left lower extremity. She denies any history of rest pain or nonhealing ulcers.  She denies any history of stroke, TIAs, expressive or receptive aphasia, or amaurosis fugax.   Past Medical History  Diagnosis Date  . Hypertension   . High cholesterol   . Depression   . Stroke    Family History  Problem Relation Age of Onset  . Cancer Mother   . Diabetes Mother   . Heart disease Mother   . Hypertension Mother   . Heart attack Mother   . Hyperlipidemia Mother   . Hypertension Father   . Diabetes Sister   . Heart disease Sister   . Hyperlipidemia Sister   . Hypertension Sister   . Cancer Sister   . Diabetes Brother   . Heart disease Brother   . Hypertension Brother   . Cancer Brother   . Hyperlipidemia Brother   . Heart disease Son   . Heart attack Son     amputation   SOCIAL HISTORY: History  Substance Use Topics  . Smoking status: Former Smoker -- 0.50 packs/day    Types: Cigarettes  . Smokeless tobacco: Never Used     Comment: using nic patches  . Alcohol Use: 0.0 - 0.6 oz/week    0-1 Shots of liquor per week   Allergies  Allergen Reactions  . Penicillins     Swelling    Current Outpatient Prescriptions  Medication Sig Dispense Refill  . amLODipine (NORVASC) 10 MG tablet Take 10 mg by mouth daily.      . Artificial Tear Ointment (ARTIFICIAL TEARS) ointment Place 1 drop into both eyes at bedtime.      Marland Kitchen. aspirin 81 MG tablet Take 81 mg by  mouth daily.      . Calcium Carbonate-Vitamin D (CALCIUM 600+D HIGH POTENCY PO) Take by mouth daily.      . Cholecalciferol (VITAMIN D) 2000 UNITS tablet Take 2,000 Units by mouth daily.      . CRESTOR 10 MG tablet       . ezetimibe-simvastatin (VYTORIN) 10-40 MG per tablet Take 1 tablet by mouth at bedtime.      . hydrochlorothiazide (MICROZIDE) 12.5 MG capsule Take 12.5 mg by mouth daily.      Marland Kitchen. lisinopril (PRINIVIL,ZESTRIL) 40 MG tablet Take 40 mg by mouth daily.      . metoprolol (LOPRESSOR) 100 MG tablet Take 100 mg by mouth 2 (two) times daily.      Marland Kitchen. tobramycin-dexamethasone (TOBRADEX) ophthalmic solution Place 1 drop into both eyes every 4 (four) hours while awake.      . traMADol (ULTRAM) 50 MG tablet       . venlafaxine (EFFEXOR) 75 MG tablet Take 75 mg by mouth 3 (three) times daily.      . clopidogrel (PLAVIX) 75 MG tablet Take 75 mg by mouth daily.       No current facility-administered medications for this visit.   REVIEW OF SYSTEMS: Arly.Keller[X ]  denotes positive finding; [  ] denotes negative finding  CARDIOVASCULAR:  [ ]  chest pain   [ ]  chest pressure   [ ]  palpitations   [ ]  orthopnea   [ ]  dyspnea on exertion   Arly.Keller[X ] claudication  Right calf [ ]  rest pain   [ ]  DVT   [ ]  phlebitis PULMONARY:   [ ]  productive cough   [ ]  asthma   [ ]  wheezing NEUROLOGIC:   [ ]  weakness  Arly.Keller[X ] paresthesias  [ ]  aphasia  [ ]  amaurosis  Arly.Keller[X ] dizziness HEMATOLOGIC:   [ ]  bleeding problems   [ ]  clotting disorders MUSCULOSKELETAL:  [ ]  joint pain   [ ]  joint swelling Arly.Keller[X ] leg swelling GASTROINTESTINAL: [ ]   blood in stool  [ ]   hematemesis GENITOURINARY:  [ ]   dysuria  [ ]   hematuria PSYCHIATRIC:  Arly.Keller[X ] history of major depression INTEGUMENTARY:  [ ]  rashes  [ ]  ulcers CONSTITUTIONAL:  [ ]  fever   [ ]  chills  PHYSICAL EXAM: Filed Vitals:   06/13/13 1133 06/13/13 1134  BP: 146/70 147/72  Pulse: 51   Height: 5\' 4"  (1.626 m)   Weight: 215 lb 8 oz (97.75 kg)   SpO2: 95%    Body mass index is 36.97  kg/(m^2). GENERAL: The patient is a well-nourished female, in no acute distress. The vital signs are documented above. CARDIOVASCULAR: There is a regular rate and rhythm. I do not detect carotid bruits. He has palpable femoral pulses. I cannot palpate pedal pulses. She has bilateral lower extremity swelling. PULMONARY: There is good air exchange bilaterally without wheezing or rales. ABDOMEN: Soft and non-tender with normal pitched bowel sounds.  MUSCULOSKELETAL: There are no major deformities or cyanosis. NEUROLOGIC: No focal weakness or paresthesias are detected. SKIN: There are no ulcers or rashes noted. PSYCHIATRIC: The patient has a normal affect.  DATA:  I have independently interpreted her carotid duplex scan which shows no evidence of significant carotid disease bilaterally. There was a less than 40% stenosis bilaterally. Both vertebral arteries were patent with normally directed flow.  I have independently interpreted her arterial Doppler study. She has monophasic Doppler signals in both feet in the dorsalis pedis and posterior tibial positions. ABI on the right is 88%. Toe pressure on the right is 87 mmHg. ABI on the left is 96%. Toe pressure on the left is 117 mm of mercury.  MEDICAL ISSUES:  PVD (peripheral vascular disease) with claudication The patient has stable right lower extremity claudication. Her ABIs are Nexium improved since her last visit. ABIs 88% on the right. She has a normal ABI on the left were she's had previous angioplasty and stenting of the left superficial femoral artery. It sounds like she does occasionally still smokes and is trying to quit. We've discussed the importance of this. I've [place] free [place] program. I've encouraged her to stay as active as possible. I've ordered followed ABIs in one year now see her back at that time. She knows to call sooner she has problems.    Return in about 1 year (around  06/14/2014).  Chuck Hinthristopher S Clotile Whittington [facility] [place] Beeper: [phone]

## 2013-06-13 NOTE — Assessment & Plan Note (Signed)
The patient has stable right lower extremity claudication. Her ABIs are Nexium improved since her last visit. ABIs 88% on the right. She has a normal ABI on the left were she's had previous angioplasty and stenting of the left superficial femoral artery. It sounds like she does occasionally still smokes and is trying to quit. We've discussed the importance of this. I've given her the [place] for [place] free [place] cessation program. I've encouraged her to stay as active as possible. I've ordered followed ABIs in one year now see her back at that time. She knows to call sooner she has problems.

## 2013-06-13 NOTE — Addendum Note (Signed)
Addended by: Adria DillELDRIDGE-LEWIS, Aryn Safran L on: 06/13/2013 12:27 PM   Modules accepted: Orders

## 2013-06-20 ENCOUNTER — Other Ambulatory Visit: Payer: Self-pay | Admitting: Gastroenterology

## 2013-07-23 ENCOUNTER — Ambulatory Visit: Payer: Medicare HMO | Admitting: Podiatry

## 2013-07-30 ENCOUNTER — Ambulatory Visit (INDEPENDENT_AMBULATORY_CARE_PROVIDER_SITE_OTHER): Payer: Medicare HMO | Admitting: Podiatrist

## 2013-07-30 ENCOUNTER — Encounter: Payer: Self-pay | Admitting: Podiatrist

## 2013-07-30 VITALS — BP 150/75 | HR 74 | Resp 17 | Ht 64.0 in | Wt 200.0 lb

## 2013-07-30 DIAGNOSIS — B351 Tinea unguium: Secondary | ICD-10-CM

## 2013-07-30 DIAGNOSIS — M216X1 Other acquired deformities of right foot: Secondary | ICD-10-CM

## 2013-07-30 DIAGNOSIS — I739 Peripheral vascular disease, unspecified: Secondary | ICD-10-CM

## 2013-07-30 DIAGNOSIS — M216X9 Other acquired deformities of unspecified foot: Secondary | ICD-10-CM

## 2013-07-30 DIAGNOSIS — M79609 Pain in unspecified limb: Secondary | ICD-10-CM

## 2013-07-30 DIAGNOSIS — Q828 Other specified congenital malformations of skin: Secondary | ICD-10-CM

## 2013-07-30 NOTE — Progress Notes (Signed)
   Subjective: Patient presents today for routine care in for a deeply enucleated lesion submetatarsal 5 right foot. She was seen by Dr. Charlsie Merles and she states that he discussed a possible surgery to remove the lesion however he wondered her to have a vascular study first. She has seen Dr. Edilia Bo and her vascular study revealed an ABI on the right  of 0.88 and on the left  0.96 with monophasic flow reported.  She relates her feet are painful due to her lesion on the right and her nails are incurvated. She is unable to trim her toenails on her own.  Objective: Pedal pulses unable to be palpated bilateral she has baseline edema noted bilateral. She has a deeply enucleated lesion submetatarsal 5 of the right foot and a prominent fifth metatarsal head as well. It has a ground glass appearance in intact integument is present post debridement. Patient's toenails are sharply incurvated and painful with pressure.  Assessment: Incurvated ingrowing nail deformity 1 through 5 bilateral, hyperkeratotic, porokeratotic lesion submetatarsal 5 right foot  Plan: Debrided the nails and the lesion today without complication. She is interested in surgery and because she artery had a conversation with Dr. Charlsie Merles about surgery I recommended that she speak with him regarding his recommendations for her. She'll be seen back for routine care in 3 months or prior to that per her request.

## 2013-11-08 ENCOUNTER — Ambulatory Visit (INDEPENDENT_AMBULATORY_CARE_PROVIDER_SITE_OTHER): Payer: Medicare HMO

## 2013-11-08 ENCOUNTER — Encounter: Payer: Self-pay | Admitting: Podiatry

## 2013-11-08 ENCOUNTER — Ambulatory Visit (INDEPENDENT_AMBULATORY_CARE_PROVIDER_SITE_OTHER): Payer: Medicare HMO | Admitting: Podiatry

## 2013-11-08 DIAGNOSIS — M216X9 Other acquired deformities of unspecified foot: Secondary | ICD-10-CM

## 2013-11-08 DIAGNOSIS — B351 Tinea unguium: Secondary | ICD-10-CM

## 2013-11-08 DIAGNOSIS — M79609 Pain in unspecified limb: Secondary | ICD-10-CM

## 2013-11-08 DIAGNOSIS — M216X1 Other acquired deformities of right foot: Secondary | ICD-10-CM

## 2013-11-08 DIAGNOSIS — M79673 Pain in unspecified foot: Secondary | ICD-10-CM

## 2013-11-08 DIAGNOSIS — Q828 Other specified congenital malformations of skin: Secondary | ICD-10-CM

## 2013-11-08 DIAGNOSIS — M779 Enthesopathy, unspecified: Secondary | ICD-10-CM

## 2013-11-12 NOTE — Progress Notes (Signed)
Subjective:     Patient ID: [name], female   DOB: August 08, 1940, 73 y.o.   MRN: [id number]  HPI patient presents with painful lesion sub-fifth metatarsal right with fluid buildup and nail disease 1-5 both feet that she cannot reach or cut and they become painful in her shoe gear   Review of Systems     Objective:   Physical Exam Inflamed fifth MPJ with fluid buildup submetatarsal head along with keratotic lesion that's painful and nail disease with pain 1-5 both feet with it at risk patient with history of PVD    Assessment:     Mycotic nail infection 1-5 both feet with inflamed capsule sub-5 right with keratotic lesion formation    Plan:     Debris painful nailbeds 1-5 both feet with no iatrogenic bleeding noted and debridement lesion sub-5 right after injecting the plantar capsule to reduce inflammation with 3 mg dexamethasone Kenalog 5 mg Xylocaine

## 2013-11-13 DIAGNOSIS — M21549 Acquired clubfoot, unspecified foot: Secondary | ICD-10-CM

## 2013-11-16 ENCOUNTER — Telehealth: Payer: Self-pay

## 2013-11-16 NOTE — Telephone Encounter (Signed)
Left message for pt to call with questions or concerns  

## 2013-11-19 ENCOUNTER — Ambulatory Visit (INDEPENDENT_AMBULATORY_CARE_PROVIDER_SITE_OTHER): Payer: Medicare HMO

## 2013-11-19 ENCOUNTER — Ambulatory Visit (INDEPENDENT_AMBULATORY_CARE_PROVIDER_SITE_OTHER): Payer: Medicare HMO | Admitting: Podiatry

## 2013-11-19 VITALS — BP 120/70 | HR 80 | Resp 16

## 2013-11-19 DIAGNOSIS — Z9889 Other specified postprocedural states: Secondary | ICD-10-CM

## 2013-11-21 NOTE — Progress Notes (Signed)
Subjective:     Patient ID: [name], female   DOB: [birth year], 73 y.o.   MRN: [id number]  HPI patient presents stating my foot feels fine with minimal discomfort or swelling   Review of Systems     Objective:   Physical Exam Neurovascular status intact with well-healing surgical site fifth metatarsal right with wound edges coapted well and no drainage and minimal edema noted    Assessment:     Healing well post fifth metatarsal head resection right    Plan:     Reapplied sterile dressing reviewed x-rays and advised on continued immobilization. Reappoint in the next 2-3 weeks earlier if any issues should occur

## 2013-12-17 ENCOUNTER — Ambulatory Visit (INDEPENDENT_AMBULATORY_CARE_PROVIDER_SITE_OTHER): Payer: Medicare HMO

## 2013-12-17 ENCOUNTER — Encounter: Payer: Self-pay | Admitting: Podiatry

## 2013-12-17 ENCOUNTER — Ambulatory Visit (INDEPENDENT_AMBULATORY_CARE_PROVIDER_SITE_OTHER): Payer: Medicare HMO | Admitting: Podiatry

## 2013-12-17 VITALS — BP 164/83 | HR 66 | Resp 16

## 2013-12-17 DIAGNOSIS — M216X1 Other acquired deformities of right foot: Secondary | ICD-10-CM

## 2013-12-18 NOTE — Progress Notes (Signed)
Subjective:     Patient ID: [name], female   DOB: 24-Mar-1940, 73 y.o.   MRN: [id number]  HPI patient presents stating I gets some burning every now and then but overall I'm doing very well   Review of Systems     Objective:   Physical Exam Neurovascular status unchanged with well coapted incision site fifth metatarsal right with plantar callus which is getting better    Assessment:     Doing much better after having metatarsal head resection fifth right    Plan:     Advised on compression therapy and gradual return soft shoe gear and debrided plantar lesion

## 2014-02-18 ENCOUNTER — Telehealth: Payer: Self-pay | Admitting: *Deleted

## 2014-02-18 NOTE — Telephone Encounter (Addendum)
Pt states she had surgery on her right foot 11/13/2013 to remove a bone that caused a callous, but the area continues to burn and cause her severe pain.  Pt presents 02/20/2014 for POV of right 5th MEtatarsal head resection.

## 2014-02-19 NOTE — Telephone Encounter (Signed)
Patient needs to schedule appointment to see Dr Charlsie Merlesegal. Last POV was in October. Due for xray and evaluation.

## 2014-02-20 ENCOUNTER — Ambulatory Visit (INDEPENDENT_AMBULATORY_CARE_PROVIDER_SITE_OTHER): Payer: Medicare Other

## 2014-02-20 ENCOUNTER — Ambulatory Visit (INDEPENDENT_AMBULATORY_CARE_PROVIDER_SITE_OTHER): Payer: Medicare Other | Admitting: Podiatry

## 2014-02-20 ENCOUNTER — Encounter: Payer: Self-pay | Admitting: Podiatry

## 2014-02-20 VITALS — BP 138/76 | HR 70 | Temp 99.1°F | Resp 15

## 2014-02-20 DIAGNOSIS — M79671 Pain in right foot: Secondary | ICD-10-CM

## 2014-02-20 DIAGNOSIS — R609 Edema, unspecified: Secondary | ICD-10-CM

## 2014-02-20 MED ORDER — CEPHALEXIN 500 MG PO CAPS: 500.0000 mg | ORAL_CAPSULE | Freq: Two times a day (BID) | ORAL | Status: DC

## 2014-02-20 NOTE — Progress Notes (Signed)
   Subjective:    Patient ID: [name], female    DOB: 09/15/1940, 73 y.o.   MRN: [id number]  HPI Comments: DOS 11/13/2013 Right 5th Metatarsal Head resection.  Pt states she continues to have the same pain and burning in the right foot as she did immediately after the surgery.     Review of Systems     Objective:   Physical Exam        Assessment & Plan:

## 2014-02-21 NOTE — Progress Notes (Signed)
Subjective:     Patient ID: [name], female   DOB: 1940-03-30, 73 y.o.   MRN: [id number]  HPI patient states she's been having pain in her right fifth metatarsal since the last time she saw me and she admits she should've come in earlier but she chose not to. She is quite emotional today so difficult to understand completely where she is at   Review of Systems     Objective:   Physical Exam Neurovascular status intact with muscle strength adequate range of motion within normal limits. The incision site looks good right fifth metatarsal with slight crusting of the dorsal incision site but no current drainage and no erythema no edema or no proximal erythema edema noted. No drainage was noted    Assessment:     Possible irritative incision site right fifth metatarsal versus other inflammatory process with no current indication of infection but cannot rule out due to pain    Plan:     X-ray reviewed and today I did a proximal nerve block and using sterile his mentation I cleaned up the incision site with no current drainage noted. This may be a suture reaction and at this time I did as precautionary measure placed her on an antibiotic and advised on soaks and reappoint in 2 weeks and if any increased swelling or proximal edema erythema or any drainage were to occur she is to let us know immediately or go straight to the emergency room if she should develop any systemic signs of infection

## 2014-03-07 ENCOUNTER — Ambulatory Visit: Payer: Self-pay

## 2014-03-07 ENCOUNTER — Encounter: Payer: Medicare Other | Admitting: Podiatry

## 2014-03-20 ENCOUNTER — Ambulatory Visit: Payer: Self-pay | Admitting: Podiatry

## 2014-03-21 ENCOUNTER — Ambulatory Visit (INDEPENDENT_AMBULATORY_CARE_PROVIDER_SITE_OTHER): Payer: Medicare Other

## 2014-03-21 ENCOUNTER — Ambulatory Visit (INDEPENDENT_AMBULATORY_CARE_PROVIDER_SITE_OTHER): Payer: Medicare Other | Admitting: Podiatry

## 2014-03-21 ENCOUNTER — Encounter: Payer: Self-pay | Admitting: Podiatry

## 2014-03-21 VITALS — BP 145/78 | HR 88 | Resp 16

## 2014-03-21 DIAGNOSIS — M216X1 Other acquired deformities of right foot: Secondary | ICD-10-CM

## 2014-03-21 NOTE — Progress Notes (Signed)
Subjective:     Patient ID: [name], female   DOB: [birth year], 74 y.o.   MRN: [id number]  HPI patient states I'm doing much better with my right foot and I have this little lesion on the right heel that I wanted you did check   Review of Systems     Objective:   Physical Exam Neurovascular status intact with well-healing surgical site right fifth metatarsal and resolution of plantar callus when palpated. Patient does have a keratotic lesion sub-fifth metatarsal right    Assessment:     Doing very well post fifth metatarsal head resection right with lesion formation right    Plan:     Reviewed final x-ray and discharge from surgery and debrided plantar lesion and reappoint as needed

## 2014-04-15 ENCOUNTER — Other Ambulatory Visit: Payer: Self-pay

## 2014-04-15 DIAGNOSIS — Z1231 Encounter for screening mammogram for malignant neoplasm of breast: Secondary | ICD-10-CM

## 2014-05-13 ENCOUNTER — Ambulatory Visit
Admission: RE | Admit: 2014-05-13 | Discharge: 2014-05-13 | Disposition: A | Discharge status: Home / Self Care | Payer: Medicare Other | Source: Ambulatory Visit

## 2014-05-13 DIAGNOSIS — Z1231 Encounter for screening mammogram for malignant neoplasm of breast: Secondary | ICD-10-CM

## 2014-06-19 ENCOUNTER — Encounter (HOSPITAL_COMMUNITY): Payer: Medicare Other

## 2014-06-25 ENCOUNTER — Encounter: Payer: Self-pay | Admitting: Vascular Surgery

## 2014-06-26 ENCOUNTER — Ambulatory Visit: Payer: Commercial Managed Care - HMO | Admitting: Family

## 2014-07-23 NOTE — Progress Notes (Signed)
This encounter was created in error - please disregard.

## 2014-09-20 ENCOUNTER — Ambulatory Visit (INDEPENDENT_AMBULATORY_CARE_PROVIDER_SITE_OTHER): Payer: Medicare Other | Admitting: Psychiatry

## 2014-09-20 ENCOUNTER — Encounter (INDEPENDENT_AMBULATORY_CARE_PROVIDER_SITE_OTHER): Payer: Self-pay

## 2014-09-20 VITALS — BP 133/74 | HR 61 | Ht 64.0 in | Wt 212.8 lb

## 2014-09-20 DIAGNOSIS — F330 Major depressive disorder, recurrent, mild: Secondary | ICD-10-CM

## 2014-09-20 DIAGNOSIS — F331 Major depressive disorder, recurrent, moderate: Secondary | ICD-10-CM

## 2014-09-20 MED ORDER — SERTRALINE HCL 100 MG PO TABS: 100.0000 mg | ORAL_TABLET | Freq: Every day | ORAL | Status: DC

## 2014-09-20 NOTE — Progress Notes (Signed)
Psychiatric Initial Adult Assessment   Patient Identification: [name] MRN:  [id number] Date of Evaluation:  09/20/2014 Referral Source: Vesta Mixer Chief Complaint:   need a more consistent office Visit Diagnosis major depression chronic mild Diagnosis:  Major depression chronic mild Patient Active Problem List   Diagnosis Date Noted  . PVD (peripheral vascular disease) with claudication [I73.9] 05/24/2012  . Peripheral vascular disease, unspecified [I73.9] 11/24/2011  . HYPERCHOLESTEROLEMIA [E78.0] 04/21/2006  . OBESITY, NOS [E66.9] 04/21/2006  . DEPRESSION, MAJOR, RECURRENT [F33.9] 04/21/2006  . TOBACCO DEPENDENCE [Z72.0] 04/21/2006  . HYPERTENSION, BENIGN SYSTEMIC [I10] 04/21/2006  . HAND PAIN [W29.562] 04/21/2006   History of Present Illness:  This patient is a 74 year old African-American widow female, a mother who is been treated and diagnosed with clinical depression for many years. She was seen at [facility] and at the [place] [facility] but was unhappy with scheduling. The patient recently in the last 6 months had a conflict with her family. Because of that she's become more withdrawn and isolated. She gouges a loss in her ability to enjoy things. She only watches TV and reads only now and then. Her depression is somewhat worse. She sleeping only fairly well. She eats well but has low energy. Patient does acknowledge that she feels worthless. The patient is not suicidal now but is been suicidal in the past. She was suicidal 5 years ago. She was hospitalized at that time. She was hospitalized in some hospital in [place]. The patient denies the use of alcohol or drugs. The patient is never been psychotic. The patient denies any symptoms consistent with mania. She denies any specific symptoms of an anxiety disorder. The patient has been in a psychiatric hospital 2 times. She was seen by Dr. Hortencia Pilar at the mental health clinic and other people at [facility]. She's never  been in interval therapy. She tried it one time for a brief time. The patient been on a number of different antidepressives including Zoloft Prozac. She says that Zoloft for the best she was never sure why was changed. At this time she takes Effexor 225 and feels like it's lost its benefits. The patient Rupp in [place]. Her parents were together while she was growing up. She experienced no physical or sexual abuse in her childhood. She graduated from high school and did 2 years of college. The patient is never had a seizure and never has lost consciousness. Financially the patient is stable. She's had no deaths in the last year. The patient has hypertension and hypercholesterolemia. At this time the patient is not suicidal. She lives a very isolated lifestyle living by herself. She's not involved in any relationships. In general is clear that she's become more on happy and more isolated since her conflict with her family. Associated Signs/Symptoms: Depression Symptoms:  psychomotor retardation, (Hypo) Manic Symptoms:   Anxiety Symptoms:   Psychotic Symptoms:   PTSD Symptoms: Negative  Past Medical History:  Past Medical History  Diagnosis Date  . Hypertension   . High cholesterol   . Depression   . Stroke     Past Surgical History  Procedure Laterality Date  . Abdominal hysterectomy    . Catheterization of the left superficial femoral artery  10/20/2009   Family History:  Family History  Problem Relation Age of Onset  . Cancer Mother   . Diabetes Mother   . Heart disease Mother   . Hypertension Mother   . Heart attack Mother   . Hyperlipidemia Mother   .  Hypertension Father   . Diabetes Sister   . Heart disease Sister   . Hyperlipidemia Sister   . Hypertension Sister   . Cancer Sister   . Diabetes Brother   . Heart disease Brother   . Hypertension Brother   . Cancer Brother   . Hyperlipidemia Brother   . Heart disease Son   . Heart attack Son     amputation   Social  History:   History   Social History  . Marital Status: Widowed    Spouse Name: N/A  . Number of Children: N/A  . Years of Education: N/A   Social History Main Topics  . Smoking status: Former Smoker -- 0.50 packs/day    Types: Cigarettes  . Smokeless tobacco: Never Used     Comment: using nic patches  . Alcohol Use: 0.0 - 0.6 oz/week    0-1 Shots of liquor per week  . Drug Use: No  . Sexual Activity: Not on file   Other Topics Concern  . Not on file   Social History Narrative   Additional Social History: Above  Musculoskeletal: Strength & Muscle Tone: within normal limits Gait & Station: normal Patient leans: Right  Psychiatric Specialty Exam: HPI  ROS  Blood pressure 133/74, pulse 61, height 5\' 4"  (1.626 m), weight 212 lb 12.8 oz (96.525 kg).Body mass index is 36.51 kg/(m^2).  General Appearance: Casual  Eye Contact:  Fair  Speech:  Slow  Volume:  Normal  Mood:  Depressed  Affect:  Blunt  Thought Process:  Goal Directed  Orientation:  Full (Time, Place, and Person)  Thought Content:  WDL  Suicidal Thoughts:  No  Homicidal Thoughts:  No  Memory:  NA  Judgement:  Fair  Insight:  Lacking  Psychomotor Activity:  Decreased  Concentration:  Fair  Recall:  Good  Fund of Knowledge:Good  Language: Good  Akathisia:  No  Handed:  Right  AIMS (if indicated):    Assets:  Desire for Improvement  ADL's:  Intact  Cognition: WNL  Sleep:    Is the patient at risk to self?  No. Has the patient been a risk to self in the past 6 months?  No. Has the patient been a risk to self within the distant past?  No. Is the patient a risk to others?  No. Has the patient been a risk to others in the past 6 months?  No. Has the patient been a risk to others within the distant past?  No.  Allergies:   Allergies  Allergen Reactions  . Penicillins     Swelling   . Shellfish Allergy Itching and Swelling   Current Medications: Current Outpatient Prescriptions  Medication Sig  Dispense Refill  . amLODipine (NORVASC) 10 MG tablet Take 10 mg by mouth daily.    . Artificial Tear Ointment (ARTIFICIAL TEARS) ointment Place 1 drop into both eyes at bedtime.    Marland Kitchen aspirin 81 MG tablet Take 81 mg by mouth daily.    . Calcium Carbonate-Vitamin D (CALCIUM 600+D HIGH POTENCY PO) Take by mouth daily.    . cephALEXin (KEFLEX) 500 MG capsule Take 1 capsule (500 mg total) by mouth 2 (two) times daily. 20 capsule 1  . Cholecalciferol (VITAMIN D) 2000 UNITS tablet Take 2,000 Units by mouth daily.    . clopidogrel (PLAVIX) 75 MG tablet Take 75 mg by mouth daily.    . CRESTOR 10 MG tablet     . ezetimibe-simvastatin (VYTORIN) 10-40 MG per tablet  Take 1 tablet by mouth at bedtime.    . hydrochlorothiazide (MICROZIDE) 12.5 MG capsule Take 12.5 mg by mouth daily.    Marland Kitchen lisinopril (PRINIVIL,ZESTRIL) 40 MG tablet Take 40 mg by mouth daily.    . metoprolol (LOPRESSOR) 100 MG tablet Take 100 mg by mouth 2 (two) times daily.    . sertraline (ZOLOFT) 100 MG tablet Take 1 tablet (100 mg total) by mouth daily. 30 tablet 3  . tobramycin-dexamethasone (TOBRADEX) ophthalmic solution Place 1 drop into both eyes every 4 (four) hours while awake.    . traMADol (ULTRAM) 50 MG tablet     . venlafaxine (EFFEXOR) 75 MG tablet Take 75 mg by mouth 3 (three) times daily.     No current facility-administered medications for this visit.    Previous Psychotropic Medications: Effexor  225 every morning  Substance Abuse History in the last 12 months:  No.  Consequences of Substance Abuse: NA  Medical Decision Making:  New problem, with additional work up planned  Treatment Plan Summary: At this time we will taper her off of her Effexor over a 1 week. At this time time we'll begin her on Zoloft we will taper up 200 mg. The patient will continue taking these medicines and will return to see me in 7 weeks. The patient is #1 problem therefore seems to be depression. Her second problem seems to be family  conflicts. At this time we will not push psychotherapy but on her next visit we'll talk about this. Further the patient will bring her hearing aid when her next visit as it was difficult to interview her because of hearing impairment. [name] third problem is that of worthlessness which she'll be entertained in process of psychotherapy. This patient is not suicidal. Today we spent over 50% of the time discussing medications and how they work in depression.    Lucas Mallow 7/29/201612:01 PM

## 2014-11-22 ENCOUNTER — Ambulatory Visit (INDEPENDENT_AMBULATORY_CARE_PROVIDER_SITE_OTHER): Payer: Medicare Other | Admitting: Psychiatry

## 2014-11-22 DIAGNOSIS — F331 Major depressive disorder, recurrent, moderate: Secondary | ICD-10-CM | POA: Diagnosis not present

## 2014-11-22 MED ORDER — ESCITALOPRAM OXALATE 10 MG PO TABS: 10.0000 mg | ORAL_TABLET | Freq: Every day | ORAL | Status: DC

## 2014-11-22 NOTE — Progress Notes (Signed)
[facility] MD Progress Note  11/22/2014 11:46 AM [name]  MRN:  [id number] Subjective:  Have a rash Principal Problem: Major depression, recurrent mild Today the patient came 40 minutes late. I chose to see her because she was coming because of a complaint of a rash from the Zoloft that she's been placed on. Patient's car and broken down and the communication to me was that she was not be able to make it. Today I did see her and is noted that her right eyebrow is swollen and that she's having some skin changes consistent with a rash. She said this is occurred since the Zoloft. For reasons that are not clear to me she did not stop the Zoloft. Today we've asked her to stop it completely and that her swelling in her face her and a rash are gone. and rash on her arms should go away and if they're not gone in one week she understands to go right to her primary care doctor. We do believe that she does need to be on an antidepressive and will give her a completely different 1 that of Lexapro at low-dose. She was asked distinctly not to start this agent until her physical reactions symptoms like swelling in her eyelid, ankles and her skin rash are gone. Today the patient denies being suicidal. She actually is sleeping and eating fairly well. She still describes being sad and depressed.  Patient Active Problem List   Diagnosis Date Noted  . PVD (peripheral vascular disease) with claudication [I73.9] 05/24/2012  . Peripheral vascular disease, unspecified [I73.9] 11/24/2011  . HYPERCHOLESTEROLEMIA [E78.0] 04/21/2006  . OBESITY, NOS [E66.9] 04/21/2006  . DEPRESSION, MAJOR, RECURRENT [F33.9] 04/21/2006  . TOBACCO DEPENDENCE [Z72.0] 04/21/2006  . HYPERTENSION, BENIGN SYSTEMIC [I10] 04/21/2006  . HAND PAIN [M79.643] 04/21/2006   Total Time spent with patient: 15 minutes  Past Psychiatric History:   Past Medical History:  Past Medical History  Diagnosis Date  . Hypertension   . High cholesterol   .  Depression   . Stroke     Past Surgical History  Procedure Laterality Date  . Abdominal hysterectomy    . Catheterization of the left superficial femoral artery  10/20/2009   Family History:  Family History  Problem Relation Age of Onset  . Cancer Mother   . Diabetes Mother   . Heart disease Mother   . Hypertension Mother   . Heart attack Mother   . Hyperlipidemia Mother   . Hypertension Father   . Diabetes Sister   . Heart disease Sister   . Hyperlipidemia Sister   . Hypertension Sister   . Cancer Sister   . Diabetes Brother   . Heart disease Brother   . Hypertension Brother   . Cancer Brother   . Hyperlipidemia Brother   . Heart disease Son   . Heart attack Son     amputation   Family Psychiatric  History:  Social History:  History  Alcohol Use  . 0.0 - 0.6 oz/week  . 0-1 Shots of liquor per week     History  Drug Use No    Social History   Social History  . Marital Status: Widowed    Spouse Name: N/A  . Number of Children: N/A  . Years of Education: N/A   Social History Main Topics  . Smoking status: Former Smoker -- 0.50 packs/day    Types: Cigarettes  . Smokeless tobacco: Never Used     Comment: using nic patches  .  Alcohol Use: 0.0 - 0.6 oz/week    0-1 Shots of liquor per week  . Drug Use: No  . Sexual Activity: Not on file   Other Topics Concern  . Not on file   Social History Narrative   Additional Social History:                         Sleep: Good  Appetite:  Good  Current Medications: Current Outpatient Prescriptions  Medication Sig Dispense Refill  . amLODipine (NORVASC) 10 MG tablet Take 10 mg by mouth daily.    . Artificial Tear Ointment (ARTIFICIAL TEARS) ointment Place 1 drop into both eyes at bedtime.    Marland Kitchen aspirin 81 MG tablet Take 81 mg by mouth daily.    . Calcium Carbonate-Vitamin D (CALCIUM 600+D HIGH POTENCY PO) Take by mouth daily.    . cephALEXin (KEFLEX) 500 MG capsule Take 1 capsule (500 mg total) by  mouth 2 (two) times daily. 20 capsule 1  . Cholecalciferol (VITAMIN D) 2000 UNITS tablet Take 2,000 Units by mouth daily.    . clopidogrel (PLAVIX) 75 MG tablet Take 75 mg by mouth daily.    . CRESTOR 10 MG tablet     . escitalopram (LEXAPRO) 10 MG tablet Take 1 tablet (10 mg total) by mouth daily. 30 tablet 3  . ezetimibe-simvastatin (VYTORIN) 10-40 MG per tablet Take 1 tablet by mouth at bedtime.    . hydrochlorothiazide (MICROZIDE) 12.5 MG capsule Take 12.5 mg by mouth daily.    Marland Kitchen lisinopril (PRINIVIL,ZESTRIL) 40 MG tablet Take 40 mg by mouth daily.    . metoprolol (LOPRESSOR) 100 MG tablet Take 100 mg by mouth 2 (two) times daily.    . sertraline (ZOLOFT) 100 MG tablet Take 1 tablet (100 mg total) by mouth daily. 30 tablet 3  . tobramycin-dexamethasone (TOBRADEX) ophthalmic solution Place 1 drop into both eyes every 4 (four) hours while awake.    . traMADol (ULTRAM) 50 MG tablet     . venlafaxine (EFFEXOR) 75 MG tablet Take 75 mg by mouth 3 (three) times daily.     No current facility-administered medications for this visit.    Lab Results: No results found for this or any previous visit (from the past 48 hour(s)).  Physical Findings: AIMS:  , ,  ,  ,    CIWA:    COWS:     Musculoskeletal: Strength & Muscle Tone: within normal limits Gait & Station: normal Patient leans: Right  Psychiatric Specialty Exam: ROS  There were no vitals taken for this visit.There is no weight on file to calculate BMI.  General Appearance: Casual  Eye Contact::  Good  Speech:  Clear and Coherent  Volume:  Normal  Mood:  Depressed  Affect:  Appropriate  Thought Process: Normal   Orientation:  Full (Time, Place, and Person)  Thought Content:  NA  Suicidal Thoughts:  No  Homicidal Thoughts:  No  Memory:  NA  Judgement:  Good  Insight:  Fair  Psychomotor Activity:  Normal  Concentration:  Good  Recall:  Good  Fund of Knowledge:Fair  Language: Fair  Akathisia:  No  Handed:  Right  AIMS  (if indicated):     Assets:  Communication Skills  ADL's:  Intact  Cognition: WNL  Sleep:      Treatment Plan Summary: At this time the patient is instructed to discontinue her Zoloft. She has some mild swelling on the right side of her  eyelid and some ankle edema which seems very mild. She does show a rash on her forearms. She says this is occurred since being on the Zoloft. We asked her now to discontinue the Zoloft and it 1 week if her symptoms haven't gone away to report to her primary care doctor. If in fact they have gone away then she is instructed to begin on Lexapro low-dose 10 mg and to return to see me in 6 weeks. This patient is not suicidal. She's not having shortness of breath she's not having any problems swallowing. She denies any chest pain. Overall the patient is physically well other than some mild swelling. The patient was told to call us if problems get worse in any way.   PLOVSKY, GERALD IRVING 11/22/2014, 11:46 AM

## 2014-12-02 ENCOUNTER — Telehealth (HOSPITAL_COMMUNITY): Payer: Self-pay

## 2014-12-02 NOTE — Telephone Encounter (Signed)
Medication management - Telephone message left for patient after she left 2 messages questioning if Dr. Donell Beers sent in a new antidepressant order to her pharmacy. Informed on message an order for Lexapro was sent to her [facility] off of [address] on 11/22/14.  Requested patient call us back if any more problems getting this new order or if problems once starts the medication.

## 2015-01-10 ENCOUNTER — Ambulatory Visit (INDEPENDENT_AMBULATORY_CARE_PROVIDER_SITE_OTHER): Payer: Medicare Other | Admitting: Psychiatry

## 2015-01-10 ENCOUNTER — Encounter (HOSPITAL_COMMUNITY): Payer: Self-pay | Admitting: Psychiatry

## 2015-01-10 VITALS — BP 140/73 | HR 63 | Ht 64.0 in | Wt 212.4 lb

## 2015-01-10 DIAGNOSIS — F3342 Major depressive disorder, recurrent, in full remission: Secondary | ICD-10-CM

## 2015-01-10 DIAGNOSIS — F331 Major depressive disorder, recurrent, moderate: Secondary | ICD-10-CM

## 2015-01-10 MED ORDER — ESCITALOPRAM OXALATE 10 MG PO TABS: 10.0000 mg | ORAL_TABLET | Freq: Every day | ORAL | Status: DC

## 2015-01-10 NOTE — Progress Notes (Signed)
[facility]  01/10/2015 11:51 AM [name]  MRN:  [id number] Subjective:  Much better, happy content Principal Problem: Major depression chronic mild Diagnosis: Major depression chronic mild Today the patient is doing better. Her rash and edema are all on since stopping Zoloft. She's now on 10 mg of Lexapro has no rash no edema and her mood is much improved. She is sleeping and eating very well. She said good energy. She enjoys television watching game shows an soap operas. She likes old comedies. The patient has good energy. She is a good sense of worth. She's not suicidal. The patient denies any psychotic symptoms. She denies the use of alcohol. The patient lives alone and likes it. She is in no relationships. She is 3 adult children who she has minimal contact with. She visit with her room heart likes it.  Patient Active Problem List   Diagnosis Date Noted  . PVD (peripheral vascular disease) with claudication (HCC) [I73.9] 05/24/2012  . Peripheral vascular disease, unspecified (HCC) [I73.9] 11/24/2011  . HYPERCHOLESTEROLEMIA [E78.00] 04/21/2006  . OBESITY, NOS [E66.9] 04/21/2006  . DEPRESSION, MAJOR, RECURRENT [F33.9] 04/21/2006  . TOBACCO DEPENDENCE [F17.200] 04/21/2006  . HYPERTENSION, BENIGN SYSTEMIC [I10] 04/21/2006  . HAND PAIN [M25.549] 04/21/2006   Total Time spent with patient: 30 minutes  Past Psychiatric History:    Past Medical History:  Past Medical History  Diagnosis Date  . Hypertension   . High cholesterol   . Depression   . Stroke [facility](HCC)     Past Surgical History  Procedure Laterality Date  . Abdominal hysterectomy    . Catheterization of the left superficial femoral artery  10/20/2009   Family History:  Family History  Problem Relation Age of Onset  . Cancer Mother   . Diabetes Mother   . Heart disease Mother   . Hypertension Mother   . Heart attack Mother   . Hyperlipidemia Mother   . Hypertension Father   . Diabetes Sister   . Heart  disease Sister   . Hyperlipidemia Sister   . Hypertension Sister   . Cancer Sister   . Diabetes Brother   . Heart disease Brother   . Hypertension Brother   . Cancer Brother   . Hyperlipidemia Brother   . Heart disease Son   . Heart attack Son     amputation   Family Psychiatric  History:   Social History:  History  Alcohol Use  . 0.0 - 0.6 oz/week  . 0-1 Shots of liquor per week     History  Drug Use No    Social History   Social History  . Marital Status: Widowed    Spouse Name: N/A  . Number of Children: N/A  . Years of Education: N/A   Social History Main Topics  . Smoking status: Former Smoker -- 0.50 packs/day    Types: Cigarettes  . Smokeless tobacco: Never Used     Comment: using nic patches  . Alcohol Use: 0.0 - 0.6 oz/week    0-1 Shots of liquor per week  . Drug Use: No  . Sexual Activity: Not Asked   Other Topics Concern  . None   Social History Narrative   Additional Social History:                         Sleep: Good  Appetite:  Good  Current Medications: Current Outpatient Prescriptions  Medication Sig Dispense Refill  . amLODipine (NORVASC)  10 MG tablet Take 10 mg by mouth daily.    . Artificial Tear Ointment (ARTIFICIAL TEARS) ointment Place 1 drop into both eyes at bedtime.    Marland Kitchen aspirin 81 MG tablet Take 81 mg by mouth daily.    . Calcium Carbonate-Vitamin D (CALCIUM 600+D HIGH POTENCY PO) Take by mouth daily.    . cephALEXin (KEFLEX) 500 MG capsule Take 1 capsule (500 mg total) by mouth 2 (two) times daily. 20 capsule 1  . Cholecalciferol (VITAMIN D) 2000 UNITS tablet Take 2,000 Units by mouth daily.    . clopidogrel (PLAVIX) 75 MG tablet Take 75 mg by mouth daily.    . CRESTOR 10 MG tablet     . escitalopram (LEXAPRO) 10 MG tablet Take 1 tablet (10 mg total) by mouth daily. 90 tablet 1  . ezetimibe-simvastatin (VYTORIN) 10-40 MG per tablet Take 1 tablet by mouth at bedtime.    . hydrochlorothiazide (MICROZIDE) 12.5 MG  capsule Take 12.5 mg by mouth daily.    Marland Kitchen lisinopril (PRINIVIL,ZESTRIL) 40 MG tablet Take 40 mg by mouth daily.    . metoprolol (LOPRESSOR) 100 MG tablet Take 100 mg by mouth 2 (two) times daily.    Marland Kitchen tobramycin-dexamethasone (TOBRADEX) ophthalmic solution Place 1 drop into both eyes every 4 (four) hours while awake.    . traMADol (ULTRAM) 50 MG tablet      No current facility-administered medications for this visit.    Lab Results: No results found for this or any previous visit (from the past 48 hour(s)).  Physical Findings: AIMS:  , ,  ,  ,    CIWA:    COWS:     Musculoskeletal: Strength & Muscle Tone: within normal limits Gait & Station: normal Patient leans: Right  Psychiatric Specialty Exam: ROS  Blood pressure 140/73, pulse 63, height  (1.626 m), weight 212 lb 6.4 oz (96.344 kg).Body mass index is 36.44 kg/(m^2).  General Appearance: Casual  Eye Contact::  Good  Speech:  Clear and Coherent  Volume:  Normal  Mood:  NA  Affect:  Congruent  Thought Process:  Coherent  Orientation:  Full (Time, Place, and Person)  Thought Content:  WDL  Suicidal Thoughts:  No  Homicidal Thoughts:  No  Memory:  NA  Judgement:  Good  Insight:  Good  Psychomotor Activity:  Normal  Concentration:  Good  Recall:  Good  Fund of Knowledge:Good  Language: Good  Akathisia:  No  Handed:  Right  AIMS (if indicated):     Assets:  Desire for Improvement  ADL's:  Intact  Cognition: WNL  Sleep:      Treatment Plan Summary: At this time the patient is actually doing very well. Her mood is stable. All her allergic symptoms are gone. She sleeping and eating well and very stable. At this time she'll continue taking Lexapro 10 mg and return to see me in 3 months. Patient is healthy does not trick any alcohol or use any drugs. She's not suicidal. She doesn't isolated lifestyle but she is happy and content.  Earnie Rockhold IRVING 01/10/2015, 11:51 AM

## 2015-04-07 ENCOUNTER — Other Ambulatory Visit: Payer: Self-pay

## 2015-04-07 DIAGNOSIS — Z1231 Encounter for screening mammogram for malignant neoplasm of breast: Secondary | ICD-10-CM

## 2015-04-17 ENCOUNTER — Ambulatory Visit (INDEPENDENT_AMBULATORY_CARE_PROVIDER_SITE_OTHER): Payer: Medicare Other | Admitting: Psychiatry

## 2015-04-17 VITALS — BP 129/69 | HR 68 | Ht 64.0 in | Wt 215.2 lb

## 2015-04-17 DIAGNOSIS — F339 Major depressive disorder, recurrent, unspecified: Secondary | ICD-10-CM

## 2015-04-17 MED ORDER — ESCITALOPRAM OXALATE 10 MG PO TABS: 10.0000 mg | ORAL_TABLET | Freq: Every day | ORAL | Status: DC

## 2015-04-17 NOTE — Progress Notes (Signed)
[facility] MD Progress Note  04/17/2015 3:28 PM CALEA HRIBAR  MRN:  [id number] Subjective: Feeling flat Principal Problem: Major depression, recurrent, residual Diagnosis:  Major depression, recurrent, residual Unfortunately the patient started getting muscle aching at the time she started the Lexapro but she was also taking Crestor started at the same time as well. Her doctor stopped both of the agents and her muscle cramping went away. I told the patient I believe is related to Crestor not the Lexapro. It should be noted the patient had a significant allergic reaction to Zoloft in the past. The patient says that she's not as depressed she's been but she still feels chronically withdrawn and mildly depressed. Since she stopped the Lexapro she says her sleep is changed and gotten much worse. Her appetite has not changed but it is low. Her energy level is low. She still think and concentrate well the patient admits to feeling worthless. She is not suicidal actively. But she says she had the choice to die she probably take to die. She is not actively suicidal. The patient still does a few things for enjoyment. She enjoys going to church and enjoys the television. She feels distant from her 3 children and her grandchildren. The patient does not drink any alcohol. She denies any chest pain or shortness of breath. The patient is very stressed out by her financial situation. She still drives. Her car is about to die. Note is the patient was psychiatrically hospitalized 2012 for depression. I believe the patient does have a significant mood disorder but it's difficult to get her on a medicine that she can take feeling difference. I think she is implying that the Lexapro was helpful it helped her sleep and probably give her more energy. Likely her mood was improved. Patient Active Problem List   Diagnosis Date Noted  . PVD (peripheral vascular disease) with claudication (HCC) [I73.9] 05/24/2012  . Peripheral vascular  disease, unspecified (HCC) [I73.9] 11/24/2011  . HYPERCHOLESTEROLEMIA [E78.00] 04/21/2006  . OBESITY, NOS [E66.9] 04/21/2006  . DEPRESSION, MAJOR, RECURRENT [F33.9] 04/21/2006  . TOBACCO DEPENDENCE [F17.200] 04/21/2006  . HYPERTENSION, BENIGN SYSTEMIC [I10] 04/21/2006  . HAND PAIN [M25.549] 04/21/2006   Total Time spent with patient: 30 minutes  Past Psychiatric History:   Past Medical History:  Past Medical History  Diagnosis Date  . Hypertension   . High cholesterol   . Depression   . Stroke [facility])     Past Surgical History  Procedure Laterality Date  . Abdominal hysterectomy    . Catheterization of the left superficial femoral artery  10/20/2009   Family History:  Family History  Problem Relation Age of Onset  . Cancer Mother   . Diabetes Mother   . Heart disease Mother   . Hypertension Mother   . Heart attack Mother   . Hyperlipidemia Mother   . Hypertension Father   . Diabetes Sister   . Heart disease Sister   . Hyperlipidemia Sister   . Hypertension Sister   . Cancer Sister   . Diabetes Brother   . Heart disease Brother   . Hypertension Brother   . Cancer Brother   . Hyperlipidemia Brother   . Heart disease Son   . Heart attack Son     amputation   Family Psychiatric  History:  Social History:  History  Alcohol Use  . 0.0 - 0.6 oz/week  . 0-1 Shots of liquor per week     History  Drug Use No  Social History   Social History  . Marital Status: Widowed    Spouse Name: N/A  . Number of Children: N/A  . Years of Education: N/A   Social History Main Topics  . Smoking status: Former Smoker -- 0.50 packs/day    Types: Cigarettes  . Smokeless tobacco: Never Used     Comment: using nic patches  . Alcohol Use: 0.0 - 0.6 oz/week    0-1 Shots of liquor per week  . Drug Use: No  . Sexual Activity: Not on file   Other Topics Concern  . Not on file   Social History Narrative   Additional Social History:                          Sleep: Fair  Appetite:  Fair  Current Medications: Current Outpatient Prescriptions  Medication Sig Dispense Refill  . amLODipine (NORVASC) 10 MG tablet Take 10 mg by mouth daily.    . Artificial Tear Ointment (ARTIFICIAL TEARS) ointment Place 1 drop into both eyes at bedtime.    Marland Kitchen aspirin 81 MG tablet Take 81 mg by mouth daily.    . Calcium Carbonate-Vitamin D (CALCIUM 600+D HIGH POTENCY PO) Take by mouth daily.    . cephALEXin (KEFLEX) 500 MG capsule Take 1 capsule (500 mg total) by mouth 2 (two) times daily. 20 capsule 1  . Cholecalciferol (VITAMIN D) 2000 UNITS tablet Take 2,000 Units by mouth daily.    . clopidogrel (PLAVIX) 75 MG tablet Take 75 mg by mouth daily.    . CRESTOR 10 MG tablet     . escitalopram (LEXAPRO) 10 MG tablet Take 1 tablet (10 mg total) by mouth daily. 90 tablet 3  . ezetimibe-simvastatin (VYTORIN) 10-40 MG per tablet Take 1 tablet by mouth at bedtime.    . hydrochlorothiazide (MICROZIDE) 12.5 MG capsule Take 12.5 mg by mouth daily.    Marland Kitchen lisinopril (PRINIVIL,ZESTRIL) 40 MG tablet Take 40 mg by mouth daily.    . metoprolol (LOPRESSOR) 100 MG tablet Take 100 mg by mouth 2 (two) times daily.    Marland Kitchen tobramycin-dexamethasone (TOBRADEX) ophthalmic solution Place 1 drop into both eyes every 4 (four) hours while awake.    . traMADol (ULTRAM) 50 MG tablet      No current facility-administered medications for this visit.    Lab Results: No results found for this or any previous visit (from the past 48 hour(s)).  Blood Alcohol level:  Lab Results  Component Value Date   [facility]  12/09/2008    <5        LOWEST DETECTABLE LIMIT FOR SERUM ALCOHOL IS 5 mg/dL FOR MEDICAL PURPOSES ONLY    Physical Findings: AIMS:  , ,  ,  ,    CIWA:    COWS:     Musculoskeletal: Strength & Muscle Tone: within normal limits Gait & Station: normal Patient leans: N/A  Psychiatric Specialty Exam: ROS  Blood pressure 129/69, pulse 68, height  (1.626 m), weight 215 lb 3.2 oz  (97.614 kg).Body mass index is 36.92 kg/(m^2).  General Appearance: Casual  Eye Contact::  Fair  Speech:  Clear and Coherent  Volume:  Normal  Mood:  Dysphoric  Affect:  Blunt  Thought Process:  Coherent  Orientation:  Full (Time, Place, and Person)  Thought Content:  WDL  Suicidal Thoughts:  No  Homicidal Thoughts:  No  Memory:  NA  Judgement:  Fair  Insight:  Fair  Psychomotor Activity:  Normal  Concentration:  Good  Recall:  Good  Fund of Knowledge:Good  Language: Fair  Akathisia:  No  Handed:  Right  AIMS (if indicated):     Assets:  Desire for Improvement  ADL's:  Intact  Cognition: WNL  Sleep:      Treatment Plan Summary: At this time the patient's in agreement to go back to the Lexapro. She go back to the 10 mg pill and if she starts having muscle cramping she'll stop it right away. It should be noted patient has been on Wellbutrin which did not have benefits. I'm hesitant to give this patient Remeron because she   Already seems quite sedated. The possibility of using Effexor could possibly be considered. the patient is not in therapy at this time. The patient currently has financial stresses. So the patient's first problem is that of chronic depression with a history of significant depression. At this time I think the patient needs to be on an antidepressant and are not ready to give up Lexapro. I do believe her cramping came from Crestor. This patient to return to see me in 7 weeks.  Lucas Mallow, MD 04/17/2015, 3:28 PM

## 2015-04-18 ENCOUNTER — Ambulatory Visit (HOSPITAL_COMMUNITY): Payer: Self-pay | Admitting: Psychiatry

## 2015-05-15 ENCOUNTER — Ambulatory Visit
Admission: RE | Admit: 2015-05-15 | Discharge: 2015-05-15 | Disposition: A | Discharge status: Home / Self Care | Payer: Medicare Other | Source: Ambulatory Visit

## 2015-05-15 DIAGNOSIS — Z1231 Encounter for screening mammogram for malignant neoplasm of breast: Secondary | ICD-10-CM

## 2015-06-13 ENCOUNTER — Ambulatory Visit (HOSPITAL_COMMUNITY): Payer: Self-pay | Admitting: Psychiatry

## 2015-06-18 ENCOUNTER — Ambulatory Visit (INDEPENDENT_AMBULATORY_CARE_PROVIDER_SITE_OTHER): Payer: Medicare Other | Admitting: Psychiatry

## 2015-06-18 ENCOUNTER — Encounter (HOSPITAL_COMMUNITY): Payer: Self-pay | Admitting: Psychiatry

## 2015-06-18 VITALS — BP 130/70 | HR 60 | Ht 64.0 in | Wt 212.0 lb

## 2015-06-18 DIAGNOSIS — F3341 Major depressive disorder, recurrent, in partial remission: Secondary | ICD-10-CM

## 2015-06-18 MED ORDER — ESCITALOPRAM OXALATE 20 MG PO TABS: ORAL_TABLET | ORAL | Status: DC

## 2015-06-18 NOTE — Progress Notes (Signed)
Patient ID: [name], female   DOB: Oct 22, 1940, 75 y.o.   MRN: [id number] [facility]  06/18/2015 2:46 PM [name]  MRN:  [id number] Subjective: Feeling flat Principal Problem: Major depression, recurrent, residual Diagnosis:  Major depression, recurrent, residual Today the patient shows little change. She is taking the Lexapro 10 mg and is not worse and is not having an allergic reaction. He's having no muscle complaints. The patient says she feels depressed about 50% of the time. So it is not persistent every day. She sleeps about 5 hours but does not have any daytime dysfunction. She says her sleep is changed like this for the last one month. The patient says she has no appetite and no energy but this been the case for years. This is not changed. The patient does describe anhedonia on a daily basis. Her concentration is fairly good. She does acknowledge that she feels worthless. She denies being actively suicidal. In fact the patient does take all her blood pressure medicines and is successfully stop smoking cigarettes. Patient is no evidence of psychosis. She says her biggest stresses finances. She wants to come less often see me. The patient is not in therapy at this time. She's very hard to motivate. The patient is not suspicious. She is not somatic. Noted is she had allergic reaction to Zoloft. The Lexapro she's taking regularly. Yet experience any benefits. Total Time spent with patient: 30 minutes  Past Psychiatric History:   Past Medical History:  Past Medical History  Diagnosis Date  . Hypertension   . High cholesterol   . Depression   . Stroke [facility](HCC)     Past Surgical History  Procedure Laterality Date  . Abdominal hysterectomy    . Catheterization of the left superficial femoral artery  10/20/2009   Family History:  Family History  Problem Relation Age of Onset  . Cancer Mother   . Diabetes Mother   . Heart disease Mother   . Hypertension Mother   . Heart  attack Mother   . Hyperlipidemia Mother   . Hypertension Father   . Diabetes Sister   . Heart disease Sister   . Hyperlipidemia Sister   . Hypertension Sister   . Cancer Sister   . Diabetes Brother   . Heart disease Brother   . Hypertension Brother   . Cancer Brother   . Hyperlipidemia Brother   . Heart disease Son   . Heart attack Son     amputation   Family Psychiatric  History:  Social History:  History  Alcohol Use  . 0.0 - 0.6 oz/week  . 0-1 Shots of liquor per week     History  Drug Use No    Social History   Social History  . Marital Status: Widowed    Spouse Name: N/A  . Number of Children: N/A  . Years of Education: N/A   Social History Main Topics  . Smoking status: Former Smoker -- 0.50 packs/day    Types: Cigarettes  . Smokeless tobacco: Never Used     Comment: using nic patches  . Alcohol Use: 0.0 - 0.6 oz/week    0-1 Shots of liquor per week  . Drug Use: No  . Sexual Activity: Not Asked   Other Topics Concern  . None   Social History Narrative   Additional Social History:  Sleep: Fair  Appetite:  Fair  Current Medications: Current Outpatient Prescriptions  Medication Sig Dispense Refill  . amLODipine (NORVASC) 10 MG tablet Take 10 mg by mouth daily.    . Artificial Tear Ointment (ARTIFICIAL TEARS) ointment Place 1 drop into both eyes at bedtime.    Marland Kitchen aspirin 81 MG tablet Take 81 mg by mouth daily.    . Calcium Carbonate-Vitamin D (CALCIUM 600+D HIGH POTENCY PO) Take by mouth daily.    . cephALEXin (KEFLEX) 500 MG capsule Take 1 capsule (500 mg total) by mouth 2 (two) times daily. 20 capsule 1  . Cholecalciferol (VITAMIN D) 2000 UNITS tablet Take 2,000 Units by mouth daily.    . clopidogrel (PLAVIX) 75 MG tablet Take 75 mg by mouth daily.    . CRESTOR 10 MG tablet     . escitalopram (LEXAPRO) 20 MG tablet 1  qam 90 tablet 1  . ezetimibe-simvastatin (VYTORIN) 10-40 MG per tablet Take 1 tablet by mouth at  bedtime.    . hydrochlorothiazide (MICROZIDE) 12.5 MG capsule Take 12.5 mg by mouth daily.    Marland Kitchen lisinopril (PRINIVIL,ZESTRIL) 40 MG tablet Take 40 mg by mouth daily.    . metoprolol (LOPRESSOR) 100 MG tablet Take 100 mg by mouth 2 (two) times daily.    Marland Kitchen tobramycin-dexamethasone (TOBRADEX) ophthalmic solution Place 1 drop into both eyes every 4 (four) hours while awake.    . traMADol (ULTRAM) 50 MG tablet      No current facility-administered medications for this visit.    Lab Results: No results found for this or any previous visit (from the past 48 hour(s)).  Blood Alcohol level:  Lab Results  Component Value Date   [facility]  12/09/2008    <5        LOWEST DETECTABLE LIMIT FOR SERUM ALCOHOL IS 5 mg/dL FOR MEDICAL PURPOSES ONLY    Physical Findings: AIMS:  , ,  ,  ,    CIWA:    COWS:     Musculoskeletal: Strength & Muscle Tone: within normal limits Gait & Station: normal Patient leans: N/A  Psychiatric Specialty Exam: ROS  Blood pressure 130/70, pulse 60, height  (1.626 m), weight 212 lb (96.163 kg).Body mass index is 36.37 kg/(m^2).  General Appearance: Casual  Eye Contact::  Fair  Speech:  Clear and Coherent  Volume:  Normal  Mood:  Dysphoric  Affect:  Blunt  Thought Process:  Coherent  Orientation:  Full (Time, Place, and Person)  Thought Content:  WDL  Suicidal Thoughts:  No  Homicidal Thoughts:  No  Memory:  NA  Judgement:  Fair  Insight:  Fair  Psychomotor Activity:  Normal  Concentration:  Good  Recall:  Good  Fund of Knowledge:Good  Language: Fair  Akathisia:  No  Handed:  Right  AIMS (if indicated):     Assets:  Desire for Improvement  ADL's:  Intact  Cognition: WNL  Sleep:      Treatment Plan Summary:At this time the patient is not better nor she worse. She is tolerating the Lexapro 10 mg without a problem. Today we'll go ahead and increase it to the full dose of 20 mg here while patient denies being persistently depressed she is chronically  withdrawn and anhedonic. The patient denies being actively suicidal. Noted the patient does go out with a friend every month and they go to dinner. Infection as well with other people the same time her part of her high school class. Note is the patient  has made an effort to stop smoking. The possibility of psychotherapy will be discussed at her next visit but first we'll give Lexapro a chance to work. Note is the patient has been hospitalized 2 times none in the last years. The patient was hospitalized in the 1990s. Apparently she talked her husband into bradycardia medicines to the psychiatric unit in [place] where she overdosed in the hospital. Medically the patient is fairly stable. Noted she has had a stroke in the past. She denies any neurological symptoms at this time. This patient she'll return to see me in 9 weeks. She is not suicidal and she is functioning as usual fairly normally. 06/18/2015, 2:46 PM

## 2015-08-20 ENCOUNTER — Ambulatory Visit (HOSPITAL_COMMUNITY): Payer: Self-pay | Admitting: Psychiatry

## 2015-09-18 ENCOUNTER — Other Ambulatory Visit: Payer: Self-pay | Admitting: Sports Medicine

## 2015-09-18 DIAGNOSIS — M542 Cervicalgia: Secondary | ICD-10-CM

## 2015-09-18 DIAGNOSIS — M4802 Spinal stenosis, cervical region: Secondary | ICD-10-CM

## 2015-09-24 ENCOUNTER — Ambulatory Visit (HOSPITAL_COMMUNITY): Payer: Self-pay | Admitting: Psychiatry

## 2015-09-27 ENCOUNTER — Ambulatory Visit
Admission: RE | Admit: 2015-09-27 | Discharge: 2015-09-27 | Disposition: A | Discharge status: Home / Self Care | Payer: Medicare Other | Source: Ambulatory Visit | Attending: Sports Medicine | Admitting: Sports Medicine

## 2015-09-27 DIAGNOSIS — M542 Cervicalgia: Secondary | ICD-10-CM

## 2015-09-27 DIAGNOSIS — M4802 Spinal stenosis, cervical region: Secondary | ICD-10-CM

## 2015-10-16 ENCOUNTER — Ambulatory Visit (HOSPITAL_COMMUNITY): Payer: Self-pay | Admitting: Psychiatry

## 2015-11-03 ENCOUNTER — Other Ambulatory Visit (HOSPITAL_COMMUNITY): Payer: Self-pay | Admitting: Psychiatry

## 2015-11-24 ENCOUNTER — Ambulatory Visit (INDEPENDENT_AMBULATORY_CARE_PROVIDER_SITE_OTHER): Payer: Medicare Other | Admitting: Sports Medicine

## 2015-11-24 ENCOUNTER — Other Ambulatory Visit: Payer: Self-pay | Admitting: Sports Medicine

## 2015-11-24 DIAGNOSIS — M25511 Pain in right shoulder: Secondary | ICD-10-CM

## 2015-11-24 DIAGNOSIS — M5010 Cervical disc disorder with radiculopathy, unspecified cervical region: Secondary | ICD-10-CM

## 2015-11-24 DIAGNOSIS — M4802 Spinal stenosis, cervical region: Secondary | ICD-10-CM | POA: Diagnosis not present

## 2015-11-24 DIAGNOSIS — M5412 Radiculopathy, cervical region: Secondary | ICD-10-CM

## 2015-12-06 ENCOUNTER — Ambulatory Visit
Admission: RE | Admit: 2015-12-06 | Discharge: 2015-12-06 | Disposition: A | Discharge status: Home / Self Care | Payer: Medicare Other | Source: Ambulatory Visit | Attending: Sports Medicine | Admitting: Sports Medicine

## 2015-12-06 DIAGNOSIS — M25511 Pain in right shoulder: Secondary | ICD-10-CM

## 2015-12-09 ENCOUNTER — Emergency Department (HOSPITAL_COMMUNITY)
Admission: EM | Admit: 2015-12-09 | Discharge: 2015-12-09 | Disposition: A | Discharge status: Home / Self Care | Payer: Medicare Other | Attending: Emergency Medicine | Admitting: Emergency Medicine

## 2015-12-09 ENCOUNTER — Encounter (HOSPITAL_COMMUNITY): Payer: Self-pay | Admitting: Emergency Medicine

## 2015-12-09 ENCOUNTER — Emergency Department (HOSPITAL_COMMUNITY): Payer: Medicare Other

## 2015-12-09 DIAGNOSIS — Y9389 Activity, other specified: Secondary | ICD-10-CM | POA: Insufficient documentation

## 2015-12-09 DIAGNOSIS — S161XXA Strain of muscle, fascia and tendon at neck level, initial encounter: Secondary | ICD-10-CM | POA: Diagnosis not present

## 2015-12-09 DIAGNOSIS — Z79899 Other long term (current) drug therapy: Secondary | ICD-10-CM | POA: Diagnosis not present

## 2015-12-09 DIAGNOSIS — Y999 Unspecified external cause status: Secondary | ICD-10-CM | POA: Diagnosis not present

## 2015-12-09 DIAGNOSIS — I10 Essential (primary) hypertension: Secondary | ICD-10-CM | POA: Diagnosis not present

## 2015-12-09 DIAGNOSIS — R51 Headache: Secondary | ICD-10-CM | POA: Insufficient documentation

## 2015-12-09 DIAGNOSIS — F17210 Nicotine dependence, cigarettes, uncomplicated: Secondary | ICD-10-CM | POA: Insufficient documentation

## 2015-12-09 DIAGNOSIS — S199XXA Unspecified injury of neck, initial encounter: Secondary | ICD-10-CM | POA: Diagnosis present

## 2015-12-09 DIAGNOSIS — Z7982 Long term (current) use of aspirin: Secondary | ICD-10-CM | POA: Insufficient documentation

## 2015-12-09 DIAGNOSIS — Y92410 Unspecified street and highway as the place of occurrence of the external cause: Secondary | ICD-10-CM | POA: Insufficient documentation

## 2015-12-09 MED ORDER — IBUPROFEN 800 MG PO TABS: 800.0000 mg | ORAL_TABLET | Freq: Three times a day (TID) | ORAL | 0 refills | Status: DC | PRN

## 2015-12-09 NOTE — ED Notes (Signed)
PA Student at bedside

## 2015-12-09 NOTE — ED Notes (Signed)
Patient transported to CT 

## 2015-12-09 NOTE — Discharge Instructions (Signed)
Return here as needed.  Follow-up with your primary care Dr. for recheck.  CT scan does not show any abnormalities.  Use ice and heat on your neck and other areas that are sore.

## 2015-12-09 NOTE — Progress Notes (Signed)
Pt confirms Dr Martha ClanWilliam Shaw as her pcp
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
## 2015-12-09 NOTE — ED Notes (Signed)
Patient calling family to find ride home

## 2015-12-10 ENCOUNTER — Ambulatory Visit (INDEPENDENT_AMBULATORY_CARE_PROVIDER_SITE_OTHER): Payer: Medicare Other | Admitting: Sports Medicine

## 2015-12-10 DIAGNOSIS — M25511 Pain in right shoulder: Secondary | ICD-10-CM

## 2015-12-10 DIAGNOSIS — M19011 Primary osteoarthritis, right shoulder: Secondary | ICD-10-CM | POA: Diagnosis not present

## 2015-12-24 ENCOUNTER — Other Ambulatory Visit (HOSPITAL_COMMUNITY): Payer: Self-pay | Admitting: Psychiatry

## 2015-12-29 ENCOUNTER — Other Ambulatory Visit (HOSPITAL_COMMUNITY): Payer: Self-pay

## 2015-12-29 MED ORDER — ESCITALOPRAM OXALATE 20 MG PO TABS: ORAL_TABLET | ORAL | 0 refills | Status: DC

## 2016-01-21 ENCOUNTER — Ambulatory Visit (HOSPITAL_COMMUNITY): Payer: Self-pay | Admitting: Psychiatry

## 2016-02-16 ENCOUNTER — Other Ambulatory Visit (HOSPITAL_COMMUNITY): Payer: Self-pay | Admitting: Psychiatry

## 2016-03-18 ENCOUNTER — Ambulatory Visit (INDEPENDENT_AMBULATORY_CARE_PROVIDER_SITE_OTHER): Payer: Medicare Other | Admitting: Psychiatry

## 2016-03-18 ENCOUNTER — Encounter (HOSPITAL_COMMUNITY): Payer: Self-pay | Admitting: Psychiatry

## 2016-03-18 VITALS — BP 126/78 | HR 60 | Ht 64.0 in | Wt 218.0 lb

## 2016-03-18 DIAGNOSIS — F329 Major depressive disorder, single episode, unspecified: Secondary | ICD-10-CM

## 2016-03-18 DIAGNOSIS — Z808 Family history of malignant neoplasm of other organs or systems: Secondary | ICD-10-CM

## 2016-03-18 DIAGNOSIS — Z833 Family history of diabetes mellitus: Secondary | ICD-10-CM

## 2016-03-18 DIAGNOSIS — F17210 Nicotine dependence, cigarettes, uncomplicated: Secondary | ICD-10-CM

## 2016-03-18 DIAGNOSIS — Z90710 Acquired absence of both cervix and uterus: Secondary | ICD-10-CM

## 2016-03-18 DIAGNOSIS — Z8249 Family history of ischemic heart disease and other diseases of the circulatory system: Secondary | ICD-10-CM

## 2016-03-18 DIAGNOSIS — Z79899 Other long term (current) drug therapy: Secondary | ICD-10-CM

## 2016-03-18 MED ORDER — ESCITALOPRAM OXALATE 10 MG PO TABS: ORAL_TABLET | ORAL | 2 refills | Status: DC
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
## 2016-04-05 ENCOUNTER — Other Ambulatory Visit: Payer: Self-pay | Admitting: Internal Medicine

## 2016-04-05 DIAGNOSIS — Z1231 Encounter for screening mammogram for malignant neoplasm of breast: Secondary | ICD-10-CM

## 2016-04-23 ENCOUNTER — Other Ambulatory Visit (HOSPITAL_COMMUNITY): Payer: Self-pay

## 2016-04-23 MED ORDER — ESCITALOPRAM OXALATE 10 MG PO TABS: ORAL_TABLET | ORAL | 0 refills | Status: DC

## 2016-05-17 ENCOUNTER — Ambulatory Visit
Admission: RE | Admit: 2016-05-17 | Discharge: 2016-05-17 | Disposition: A | Discharge status: Home / Self Care | Payer: Medicare Other | Source: Ambulatory Visit | Attending: Internal Medicine | Admitting: Internal Medicine

## 2016-05-17 DIAGNOSIS — Z1231 Encounter for screening mammogram for malignant neoplasm of breast: Secondary | ICD-10-CM

## 2016-05-19 ENCOUNTER — Other Ambulatory Visit: Payer: Self-pay | Admitting: Internal Medicine

## 2016-05-19 DIAGNOSIS — R928 Other abnormal and inconclusive findings on diagnostic imaging of breast: Secondary | ICD-10-CM

## 2016-05-25 ENCOUNTER — Ambulatory Visit
Admission: RE | Admit: 2016-05-25 | Discharge: 2016-05-25 | Disposition: A | Discharge status: Home / Self Care | Payer: Medicare Other | Source: Ambulatory Visit | Attending: Internal Medicine | Admitting: Internal Medicine

## 2016-05-25 ENCOUNTER — Other Ambulatory Visit: Payer: Self-pay | Admitting: Internal Medicine

## 2016-05-25 DIAGNOSIS — R928 Other abnormal and inconclusive findings on diagnostic imaging of breast: Secondary | ICD-10-CM

## 2016-06-11 ENCOUNTER — Other Ambulatory Visit (HOSPITAL_COMMUNITY): Payer: Self-pay | Admitting: Psychiatry

## 2016-06-17 ENCOUNTER — Ambulatory Visit (HOSPITAL_COMMUNITY): Payer: Self-pay | Admitting: Psychiatry

## 2016-09-02 ENCOUNTER — Ambulatory Visit (HOSPITAL_COMMUNITY): Payer: Self-pay | Admitting: Psychiatry

## 2017-04-28 ENCOUNTER — Other Ambulatory Visit: Payer: Self-pay | Admitting: Internal Medicine

## 2017-04-28 DIAGNOSIS — Z1231 Encounter for screening mammogram for malignant neoplasm of breast: Secondary | ICD-10-CM

## 2017-05-02 ENCOUNTER — Ambulatory Visit (INDEPENDENT_AMBULATORY_CARE_PROVIDER_SITE_OTHER): Payer: Medicare Other

## 2017-05-02 ENCOUNTER — Other Ambulatory Visit: Payer: Self-pay

## 2017-05-02 ENCOUNTER — Encounter (HOSPITAL_COMMUNITY): Payer: Self-pay | Admitting: Emergency Medicine

## 2017-05-02 ENCOUNTER — Ambulatory Visit (HOSPITAL_COMMUNITY)
Admission: EM | Admit: 2017-05-02 | Discharge: 2017-05-02 | Disposition: A | Discharge status: Home / Self Care | Payer: Medicare Other | Attending: Family Medicine | Admitting: Family Medicine

## 2017-05-02 DIAGNOSIS — M79622 Pain in left upper arm: Secondary | ICD-10-CM

## 2017-05-02 DIAGNOSIS — M25512 Pain in left shoulder: Secondary | ICD-10-CM

## 2017-05-02 NOTE — Discharge Instructions (Signed)
I did not see any broken bones on your x-rays today. You may use over the counter ibuprofen or acetaminophen as needed for any discomfort.  Please return to see us or make an appointment to see Dr. Clelia CroftShaw if you're not improving over the next week.
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
## 2017-05-24 ENCOUNTER — Ambulatory Visit
Admission: RE | Admit: 2017-05-24 | Discharge: 2017-05-24 | Disposition: A | Discharge status: Home / Self Care | Payer: Medicare Other | Source: Ambulatory Visit | Attending: Internal Medicine | Admitting: Internal Medicine

## 2017-05-24 DIAGNOSIS — Z1231 Encounter for screening mammogram for malignant neoplasm of breast: Secondary | ICD-10-CM

## 2017-07-25 ENCOUNTER — Encounter (INDEPENDENT_AMBULATORY_CARE_PROVIDER_SITE_OTHER): Payer: Self-pay | Admitting: Orthopaedic Surgery

## 2017-07-25 ENCOUNTER — Ambulatory Visit (INDEPENDENT_AMBULATORY_CARE_PROVIDER_SITE_OTHER): Payer: Medicare Other | Admitting: Orthopaedic Surgery

## 2017-07-25 DIAGNOSIS — M25512 Pain in left shoulder: Secondary | ICD-10-CM

## 2017-07-25 MED ORDER — METHYLPREDNISOLONE ACETATE 40 MG/ML IJ SUSP
40.0000 mg | INTRAMUSCULAR | Status: AC | PRN
  Administered 2017-07-25: 40 mg via INTRA_ARTICULAR

## 2017-07-25 MED ORDER — LIDOCAINE HCL 1 % IJ SOLN
3.0000 mL | INTRAMUSCULAR | Status: AC | PRN
  Administered 2017-07-25: 3 mL

## 2017-07-25 NOTE — Progress Notes (Signed)
Office Visit Note   Patient: [name]           Date of Birth: 1940-07-20           MRN: [id number] Visit Date: 07/25/2017              Requested by: Martha ClanShaw, William, MD [address] [place], [place] [id number] PCP: Martha ClanShaw, William, MD   Assessment & Plan: Visit Diagnoses:  1. Acute pain of left shoulder     Plan: Certainly the patient did sustain a contusion to her shoulder.  I did provide injection subacromial space to see how this would help with her pain to see if this will temporize the subdeltoid type of pain.  She tolerated this well.  All questions concerns were answered and addressed.  I want her to work on shoulder exercises that I had her demonstrate back to me.  We will reevaluate her in 2 weeks.  If she continues to have difficulty with the shoulder we would likely obtain an MRI then.  Offered formal physical therapy but she said she is working with physical therapy office and can do the exercises on room.  Follow-Up Instructions: Return in about 2 weeks (around 08/08/2017).   Orders:  No orders of the defined types were placed in this encounter.  No orders of the defined types were placed in this encounter.     Procedures: Large Joint Inj: L subacromial bursa on 07/25/2017 9:47 AM Indications: pain and diagnostic evaluation Details: 22 G 1.5 in needle  Arthrogram: No  Medications: 3 mL lidocaine 1 %; 40 mg methylPREDNISolone acetate 40 MG/ML Outcome: tolerated well, no immediate complications Procedure, treatment alternatives, risks and benefits explained, specific risks discussed. Consent was given by the patient. Immediately prior to procedure a time out was called to verify the correct patient, procedure, equipment, support staff and site/side marked as required. Patient was prepped and draped in the usual sterile fashion.       Clinical Data: No additional findings.   Subjective: Chief Complaint  Patient presents with  . Left Shoulder - Pain,  Injury  Patient comes in today with a history of acute left shoulder injury that occurred in March.  She was at a restaurant and some type of barstool fell against her arm.  She has a stacked up on the table beside her and fell against her.  Is really bothering her a lot since then.  She saw her primary care physician who was referred to us.  She points to mainly the mid humerus as source of her pain.  This is limited her shoulder function.  She denies any injury to her left shoulder in the past and no decrease in left shoulder function prior to this accident and injury.  X-rays only canopy system for me to review.  HPI  Review of Systems She currently denies any headache, chest pain, shortness of breath, fever, chills, nausea, vomiting.  She is alert and oriented x3 and in no acute distress  Objective: Vital Signs: There were no vitals taken for this visit.  Physical Exam She is alert and oriented x3 and in no acute distress Ortho Exam Examination of her left shoulder shows stiffness with external rotation and abduction.  The rotator cuff itself is difficult to assess secondary pain with overhead function and motion.  The shoulder itself clinically is well located and her liftoff is negative.  Her pain seems to be around the subdeltoid area of the shoulder and into  the mid humerus.  Her biceps tendon is intact. Specialty Comments:  No specialty comments available.  Imaging: No results found. X-rays independently reviewed of the left shoulder show a well located shoulder with a normal glenohumeral joint.  There is some mild arthritic changes at the undersurface the acromion but otherwise no acute findings.  PMFS History: Patient Active Problem List   Diagnosis Date Noted  . PVD (peripheral vascular disease) with claudication (HCC) 05/24/2012  . Peripheral vascular disease, unspecified (HCC) 11/24/2011  . HYPERCHOLESTEROLEMIA 04/21/2006  . OBESITY, NOS 04/21/2006  . DEPRESSION, MAJOR,  RECURRENT 04/21/2006  . TOBACCO DEPENDENCE 04/21/2006  . HYPERTENSION, BENIGN SYSTEMIC 04/21/2006  . HAND PAIN 04/21/2006   Past Medical History:  Diagnosis Date  . Depression   . High cholesterol   . Hypertension   . Stroke [facility])     Family History  Problem Relation Age of Onset  . Cancer Mother   . Diabetes Mother   . Heart disease Mother   . Hypertension Mother   . Heart attack Mother   . Hyperlipidemia Mother   . Hypertension Father   . Diabetes Sister   . Heart disease Sister   . Hyperlipidemia Sister   . Hypertension Sister   . Cancer Sister   . Diabetes Brother   . Heart disease Brother   . Hypertension Brother   . Cancer Brother   . Hyperlipidemia Brother   . Heart disease Son   . Heart attack Son        amputation    Past Surgical History:  Procedure Laterality Date  . ABDOMINAL HYSTERECTOMY    . catheterization of the left superficial femoral artery  10/20/2009   Social History   Occupational History  . Not on file  Tobacco Use  . Smoking status: Current Every Day Smoker    Packs/day: 0.50    Types: Cigarettes  . Smokeless tobacco: Never Used  . Tobacco comment: using nic patches  Substance and Sexual Activity  . Alcohol use: Yes    Alcohol/week: 0.0 - 0.6 oz  . Drug use: No  . Sexual activity: Not on file

## 2017-08-08 ENCOUNTER — Ambulatory Visit (INDEPENDENT_AMBULATORY_CARE_PROVIDER_SITE_OTHER): Payer: Medicare Other | Admitting: Orthopaedic Surgery

## 2017-08-08 ENCOUNTER — Encounter (INDEPENDENT_AMBULATORY_CARE_PROVIDER_SITE_OTHER): Payer: Self-pay | Admitting: Orthopaedic Surgery

## 2017-08-08 DIAGNOSIS — M25512 Pain in left shoulder: Secondary | ICD-10-CM | POA: Diagnosis not present

## 2017-08-08 NOTE — Progress Notes (Signed)
The patient comes in today for follow-up after I provided steroid injection in her left shoulder 2 weeks ago.  She had injured this shoulder back in March and some type of accident in a restaurant.  She had significant pain since then.  The injection I provided her helped quite a bit.  She says she has minimal pain now.  On examination of the left shoulder is well located.  She can move her shoulder easily.  Her rotator cuff is entirely intact with abduction.  She feels better overall.  At this point there is nothing else I recommend since she is doing much better overall.  Most likely she sustained some type of contusion or bone bruise of the shoulder and should get continued resolution of her pain given the fact that she is doing much better now.  Follow-up will be as needed.  There is no significant permanent disability as it relates to her acute left shoulder injury.

## 2017-10-26 ENCOUNTER — Ambulatory Visit (INDEPENDENT_AMBULATORY_CARE_PROVIDER_SITE_OTHER): Payer: Medicare Other | Admitting: Physician Assistant

## 2017-10-26 ENCOUNTER — Encounter (INDEPENDENT_AMBULATORY_CARE_PROVIDER_SITE_OTHER): Payer: Self-pay | Admitting: Physician Assistant

## 2017-10-26 DIAGNOSIS — G8929 Other chronic pain: Secondary | ICD-10-CM | POA: Diagnosis not present

## 2017-10-26 DIAGNOSIS — M25512 Pain in left shoulder: Secondary | ICD-10-CM | POA: Diagnosis not present

## 2017-10-26 DIAGNOSIS — M7502 Adhesive capsulitis of left shoulder: Secondary | ICD-10-CM | POA: Diagnosis not present

## 2017-10-26 DIAGNOSIS — M792 Neuralgia and neuritis, unspecified: Secondary | ICD-10-CM

## 2017-10-26 NOTE — Progress Notes (Signed)
Office Visit Note   Patient: [name]           Date of Birth: 20-Mar-1940           MRN: [id number] Visit Date: 10/26/2017              Requested by: Martha Clan, MD [address] [place], [place] [address] PCP: Martha Clan, MD   Assessment & Plan: Visit Diagnoses:  1. Adhesive capsulitis of left shoulder   2. Chronic left shoulder pain   3. Radicular pain in left arm     Plan: We will obtain an MRI of her left shoulder to rule out rotator cuff tear.  Also EMG nerve conduction studies upper extremities to rule out brachial plexus injury due to her radicular pain down the left arm.  Follow-up after studies to go over results and discuss further treatment.  Follow-Up Instructions: Return for after MRI.   Orders:  Orders Placed This Encounter  Procedures  . MR Shoulder Left w/o contrast   No orders of the defined types were placed in this encounter.     Procedures: No procedures performed   Clinical Data: No additional findings.   Subjective: Chief Complaint  Patient presents with  . Left Shoulder - Pain, Follow-up, Weakness, Tingling  . Left Arm - Pain, Follow-up, Weakness, Tingling    HPI Ms. states returns today follow-up of her left shoulder pain.  Again she sustained an injury to her left shoulder when a barstool fell against her arm.  She saw Dr. Magnus Ivan and had a injection early June subacromial left shoulder which helped some.  However she returns today stating she is having pain in the left shoulder with tingling weakness left arm.  She denies any neck pain.  She states she has decreased range of motion of the left shoulder.  She has had no new injury. Review of Systems Please see HPI otherwise negative  Objective: Vital Signs: There were no vitals taken for this visit.  Physical Exam  Constitutional: She is oriented to person, place, and time. She appears well-developed and well-nourished. No distress.  Pulmonary/Chest: Effort normal.    Neurological: She is alert and oriented to person, place, and time.  Skin: She is not diaphoretic.    Ortho Exam Good range of motion cervical spine without pain.  5 out of 5 strength with external and internal rotation against resistance bilateral shoulders.  Unable to perform empty can test due to the fact the patient is unable to fully pronate the left arm.  Forward flexion left arm actively to approximately 80 degrees passively and bring it to 90 degrees only.  Unable to perform impingement testing due to this.  Radial pulses are intact bilaterally.  She has subjective decreased sensation in the long finger and ring finger and fifth finger of the left hand compared to the right.  Slight decrease in built and ability to fully extend the left thumb compared to the right otherwise full motor of the hands bilaterally. Specialty Comments:  No specialty comments available.  Imaging: No results found.   PMFS History: Patient Active Problem List   Diagnosis Date Noted  . PVD (peripheral vascular disease) with claudication (HCC) 05/24/2012  . Peripheral vascular disease, unspecified (HCC) 11/24/2011  . HYPERCHOLESTEROLEMIA 04/21/2006  . OBESITY, NOS 04/21/2006  . DEPRESSION, MAJOR, RECURRENT 04/21/2006  . TOBACCO DEPENDENCE 04/21/2006  . HYPERTENSION, BENIGN SYSTEMIC 04/21/2006  . HAND PAIN 04/21/2006   Past Medical History:  Diagnosis Date  .  Depression   . High cholesterol   . Hypertension   . Stroke [facility])     Family History  Problem Relation Age of Onset  . Cancer Mother   . Diabetes Mother   . Heart disease Mother   . Hypertension Mother   . Heart attack Mother   . Hyperlipidemia Mother   . Hypertension Father   . Diabetes Sister   . Heart disease Sister   . Hyperlipidemia Sister   . Hypertension Sister   . Cancer Sister   . Diabetes Brother   . Heart disease Brother   . Hypertension Brother   . Cancer Brother   . Hyperlipidemia Brother   . Heart disease Son   .  Heart attack Son        amputation    Past Surgical History:  Procedure Laterality Date  . ABDOMINAL HYSTERECTOMY    . catheterization of the left superficial femoral artery  10/20/2009   Social History   Occupational History  . Not on file  Tobacco Use  . Smoking status: Current Every Day Smoker    Packs/day: 0.50    Types: Cigarettes  . Smokeless tobacco: Never Used  . Tobacco comment: using nic patches  Substance and Sexual Activity  . Alcohol use: Yes    Alcohol/week: 0.0 - 1.0 standard drinks  . Drug use: No  . Sexual activity: Not on file

## 2017-11-13 ENCOUNTER — Ambulatory Visit
Admission: RE | Admit: 2017-11-13 | Discharge: 2017-11-13 | Disposition: A | Discharge status: Home / Self Care | Payer: Medicare Other | Source: Ambulatory Visit | Attending: Physician Assistant | Admitting: Physician Assistant

## 2017-11-13 DIAGNOSIS — M25512 Pain in left shoulder: Principal | ICD-10-CM

## 2017-11-13 DIAGNOSIS — G8929 Other chronic pain: Secondary | ICD-10-CM

## 2017-11-15 ENCOUNTER — Ambulatory Visit (INDEPENDENT_AMBULATORY_CARE_PROVIDER_SITE_OTHER): Payer: Medicare Other | Admitting: Physical Medicine and Rehabilitation

## 2017-11-15 ENCOUNTER — Encounter (INDEPENDENT_AMBULATORY_CARE_PROVIDER_SITE_OTHER): Payer: Self-pay | Admitting: Physical Medicine and Rehabilitation

## 2017-11-15 DIAGNOSIS — R202 Paresthesia of skin: Secondary | ICD-10-CM

## 2017-11-15 NOTE — Progress Notes (Signed)
 .  Numeric Pain Rating Scale and Functional Assessment Average Pain 7   In the last MONTH (on 0-10 scale) has pain interfered with the following?  1. General activity like being  able to carry out your everyday physical activities such as walking, climbing stairs, carrying groceries, or moving a chair?  Rating(5)   

## 2017-11-16 NOTE — Procedures (Signed)
EMG & NCV Findings: Evaluation of the left median motor nerve showed reduced amplitude (4.9 mV).  The left median (across palm) sensory nerve showed no response (Palm) and prolonged distal peak latency (3.9 ms).  The left ulnar sensory nerve showed prolonged distal peak latency (3.9 ms), reduced amplitude (14.0 V), and decreased conduction velocity (Wrist-5th Digit, 36 m/s).  All remaining nerves (as indicated in the following tables) were within normal limits.    All examined muscles (as indicated in the following table) showed no evidence of electrical instability.    Impression: The above electrodiagnostic study is essentially NORMAL but reveals some mild evidence suggestive peripheral sensory neuropathy. There is no significant electrodiagnostic evidence of any focal nerve entrapment, brachial plexopathy or cervical radiculopathy.    As you know, this particular electrodiagnostic study cannot rule out chemical radiculitis or sensory only radiculopathy.  Recommendations: 1.  Follow-up with referring physician. 2.  Continue current management of symptoms.  Tingling and numbness could be from a very mild polyneuropathy.  MRI of the shoulder did not indicate any denervation signs of the musculature of the shoulder and there is no findings of brachial plexopathy.  This would not rule out a cervical radiculitis radiculopathy and MRI of the cervical spine could be considered if clinically warranted.  ___________________________ Elease HashimotoFred Sharlee Rufino FAAPMR Board Certified, American Board of Physical Medicine and Rehabilitation    Nerve Conduction Studies Anti Sensory Summary Table   Stim Site NR Peak (ms) Norm Peak (ms) P-T Amp (V) Norm P-T Amp Site1 Site2 Delta-P (ms) Dist (cm) Vel (m/s) Norm Vel (m/s)  Left Median Acr Palm Anti Sensory (2nd Digit)  32.6C  Wrist    *3.9 <3.6 11.0 >10 Wrist Palm  0.0    Palm *NR  <2.0          Left Radial Anti Sensory (Base 1st Digit)  32.3C  Wrist    2.3 <3.1  28.2  Wrist Base 1st Digit 2.3 0.0    Left Ulnar Anti Sensory (5th Digit)  32.7C  Wrist    *3.9 <3.7 *14.0 >15.0 Wrist 5th Digit 3.9 14.0 *36 >38   Motor Summary Table   Stim Site NR Onset (ms) Norm Onset (ms) O-P Amp (mV) Norm O-P Amp Site1 Site2 Delta-0 (ms) Dist (cm) Vel (m/s) Norm Vel (m/s)  Left Median Motor (Abd Poll Brev)  32.5C  Wrist    4.1 <4.2 *4.9 >5 Elbow Wrist 4.2 21.4 51 >50  Elbow    8.3  5.9         Left Ulnar Motor (Abd Dig Min)  32.8C  Wrist    3.2 <4.2 7.9 >3 B Elbow Wrist 4.1 22.0 54 >53  B Elbow    7.3  6.8  A Elbow B Elbow 1.5 10.0 67 >53  A Elbow    8.8  8.1          EMG   Side Muscle Nerve Root Ins Act Fibs Psw Amp Dur Poly Recrt Int Dennie BiblePat Comment  Left 1stDorInt Ulnar C8-T1 Nml Nml Nml Nml Nml 0 Nml Nml   Left Abd Poll Brev Median C8-T1 Nml Nml Nml Nml Nml 0 Nml Nml   Left ExtDigCom   Nml Nml Nml Nml Nml 0 Nml Nml   Left Triceps Radial C6-7-8 Nml Nml Nml Nml Nml 0 Nml Nml   Left Deltoid Axillary C5-6 Nml Nml Nml Nml Nml 0 Nml Nml     Nerve Conduction Studies Anti Sensory Left/Right Comparison   Stim Site L  Lat (ms) R Lat (ms) L-R Lat (ms) L Amp (V) R Amp (V) L-R Amp (%) Site1 Site2 L Vel (m/s) R Vel (m/s) L-R Vel (m/s)  Median Acr Palm Anti Sensory (2nd Digit)  32.6C  Wrist *3.9   11.0   Wrist Palm     Palm             Radial Anti Sensory (Base 1st Digit)  32.3C  Wrist 2.3   28.2   Wrist Base 1st Digit     Ulnar Anti Sensory (5th Digit)  32.7C  Wrist *3.9   *14.0   Wrist 5th Digit *36     Motor Left/Right Comparison   Stim Site L Lat (ms) R Lat (ms) L-R Lat (ms) L Amp (mV) R Amp (mV) L-R Amp (%) Site1 Site2 L Vel (m/s) R Vel (m/s) L-R Vel (m/s)  Median Motor (Abd Poll Brev)  32.5C  Wrist 4.1   *4.9   Elbow Wrist 51    Elbow 8.3   5.9         Ulnar Motor (Abd Dig Min)  32.8C  Wrist 3.2   7.9   B Elbow Wrist 54    B Elbow 7.3   6.8   A Elbow B Elbow 67    A Elbow 8.8   8.1            Waveforms:

## 2017-11-17 ENCOUNTER — Ambulatory Visit (INDEPENDENT_AMBULATORY_CARE_PROVIDER_SITE_OTHER): Payer: Medicare Other | Admitting: Physician Assistant

## 2017-11-17 NOTE — Progress Notes (Signed)
[name] - 77 y.o. female MRN [id number]  Date of birth: 06-01-1940  Office Visit Note: Visit Date: 11/15/2017 PCP: Marton Redwood, MD Referred by: Marton Redwood, MD  Subjective: Chief Complaint  Patient presents with  . Left Shoulder - Pain, Tingling  . Left Arm - Pain, Tingling   HPI: [name] is a 77 year old right-hand-dominant female that comes in today at the request of Dr. Jean Rosenthal and Benita Stabile, P.A.-C for electrodiagnostic study of the left upper limb.  She reports a specific injury that occurred around May 01, 2017 where she was sitting at the [employer] and some type of chairs were stacked in an area above the table and the stairs fell and hit her on the shoulder.  Her knee is well-documented by the ED notes and by Dr. Ninfa Linden.  She reports that since that time she has had pain and tingling in her left shoulder and left arm with decreased range of motion and increased pain with trying to lift and pull objects.  She reports warm showers and heating pad seems to help a little bit.  Her average pain is 7 out of 10 it does limit what she can do.  She is had no prior history of cervical problems or cervical surgery.  No prior electrodiagnostic studies.  She has had MRI of the shoulder and this is reviewed below.  She has not met with Dr. Ninfa Linden get to review the studies.  Review of the shoulder MRI was not done today.   ROS Otherwise per HPI.  Assessment & Plan: Visit Diagnoses:  1. Paresthesia of skin     Plan:Impression: The above electrodiagnostic study is essentially NORMAL but reveals some mild evidence suggestive peripheral sensory neuropathy. There is no significant electrodiagnostic evidence of any focal nerve entrapment, brachial plexopathy or cervical radiculopathy.    As you know, this particular electrodiagnostic study cannot rule out chemical radiculitis or sensory only radiculopathy.  Recommendations: 1.  Follow-up with referring  physician. 2.  Continue current management of symptoms.  Tingling and numbness could be from a very mild polyneuropathy.  MRI of the shoulder did not indicate any denervation signs of the musculature of the shoulder and there is no findings of brachial plexopathy.  This would not rule out a cervical radiculitis radiculopathy and MRI of the cervical spine could be considered if clinically warranted.    Meds & Orders: No orders of the defined types were placed in this encounter.   Orders Placed This Encounter  Procedures  . NCV with EMG (electromyography)    Follow-up: Return for Jean Rosenthal, M.D..   Procedures: No procedures performed  EMG & NCV Findings: Evaluation of the left median motor nerve showed reduced amplitude (4.9 mV).  The left median (across palm) sensory nerve showed no response (Palm) and prolonged distal peak latency (3.9 ms).  The left ulnar sensory nerve showed prolonged distal peak latency (3.9 ms), reduced amplitude (14.0 V), and decreased conduction velocity (Wrist-5th Digit, 36 m/s).  All remaining nerves (as indicated in the following tables) were within normal limits.    All examined muscles (as indicated in the following table) showed no evidence of electrical instability.    Impression: The above electrodiagnostic study is essentially NORMAL but reveals some mild evidence suggestive peripheral sensory neuropathy. There is no significant electrodiagnostic evidence of any focal nerve entrapment, brachial plexopathy or cervical radiculopathy.    As you know, this particular electrodiagnostic study cannot rule out chemical radiculitis or sensory  only radiculopathy.  Recommendations: 1.  Follow-up with referring physician. 2.  Continue current management of symptoms.  Tingling and numbness could be from a very mild polyneuropathy.  MRI of the shoulder did not indicate any denervation signs of the musculature of the shoulder and there is no findings of  brachial plexopathy.  This would not rule out a cervical radiculitis radiculopathy and MRI of the cervical spine could be considered if clinically warranted.  ___________________________ Wonda Olds Board Certified, American Board of Physical Medicine and Rehabilitation    Nerve Conduction Studies Anti Sensory Summary Table   Stim Site NR Peak (ms) Norm Peak (ms) P-T Amp (V) Norm P-T Amp Site1 Site2 Delta-P (ms) Dist (cm) Vel (m/s) Norm Vel (m/s)  Left Median Acr Palm Anti Sensory (2nd Digit)  32.6C  Wrist    *3.9 <3.6 11.0 >10 Wrist Palm  0.0    Palm *NR  <2.0          Left Radial Anti Sensory (Base 1st Digit)  32.3C  Wrist    2.3 <3.1 28.2  Wrist Base 1st Digit 2.3 0.0    Left Ulnar Anti Sensory (5th Digit)  32.7C  Wrist    *3.9 <3.7 *14.0 >15.0 Wrist 5th Digit 3.9 14.0 *36 >38   Motor Summary Table   Stim Site NR Onset (ms) Norm Onset (ms) O-P Amp (mV) Norm O-P Amp Site1 Site2 Delta-0 (ms) Dist (cm) Vel (m/s) Norm Vel (m/s)  Left Median Motor (Abd Poll Brev)  32.5C  Wrist    4.1 <4.2 *4.9 >5 Elbow Wrist 4.2 21.4 51 >50  Elbow    8.3  5.9         Left Ulnar Motor (Abd Dig Min)  32.8C  Wrist    3.2 <4.2 7.9 >3 B Elbow Wrist 4.1 22.0 54 >53  B Elbow    7.3  6.8  A Elbow B Elbow 1.5 10.0 67 >53  A Elbow    8.8  8.1          EMG   Side Muscle Nerve Root Ins Act Fibs Psw Amp Dur Poly Recrt Int Fraser Din Comment  Left 1stDorInt Ulnar C8-T1 Nml Nml Nml Nml Nml 0 Nml Nml   Left Abd Poll Brev Median C8-T1 Nml Nml Nml Nml Nml 0 Nml Nml   Left ExtDigCom   Nml Nml Nml Nml Nml 0 Nml Nml   Left Triceps Radial C6-7-8 Nml Nml Nml Nml Nml 0 Nml Nml   Left Deltoid Axillary C5-6 Nml Nml Nml Nml Nml 0 Nml Nml     Nerve Conduction Studies Anti Sensory Left/Right Comparison   Stim Site L Lat (ms) R Lat (ms) L-R Lat (ms) L Amp (V) R Amp (V) L-R Amp (%) Site1 Site2 L Vel (m/s) R Vel (m/s) L-R Vel (m/s)  Median Acr Palm Anti Sensory (2nd Digit)  32.6C  Wrist *3.9   11.0   Wrist Palm      Palm             Radial Anti Sensory (Base 1st Digit)  32.3C  Wrist 2.3   28.2   Wrist Base 1st Digit     Ulnar Anti Sensory (5th Digit)  32.7C  Wrist *3.9   *14.0   Wrist 5th Digit *36     Motor Left/Right Comparison   Stim Site L Lat (ms) R Lat (ms) L-R Lat (ms) L Amp (mV) R Amp (mV) L-R Amp (%) Site1 Site2 L Vel (m/s) R Vel (  m/s) L-R Vel (m/s)  Median Motor (Abd Poll Brev)  32.5C  Wrist 4.1   *4.9   Elbow Wrist 51    Elbow 8.3   5.9         Ulnar Motor (Abd Dig Min)  32.8C  Wrist 3.2   7.9   B Elbow Wrist 54    B Elbow 7.3   6.8   A Elbow B Elbow 67    A Elbow 8.8   8.1            Waveforms:            Clinical History: MRI of left shoulder 11/14/2017   IMPRESSION: 1. Moderate tendinosis of the supraspinatus tendon with a high-grade partial-thickness bursal surface tear with possible tiny full-thickness component anteriorly. 2. Moderate tendinosis of the infraspinatus tendon with a large cystic area within the infraspinatus tendon just beyond the musculotendinous junction measuring 8 x 10 x 20 mm likely reflecting a ganglion cyst within the tendon. 3. Moderate tendinosis of the intra-articular portion of the long head of the biceps tendon.   Electronically Signed   By: Kathreen Devoid   On: 11/14/2017 08:21   She reports that she has been smoking cigarettes. She has been smoking about 0.50 packs per day. She has never used smokeless tobacco. No results for input(s): HGBA1C, LABURIC in the last 8760 hours.  Objective:  VS:  HT:    WT:   BMI:     BP:   HR: bpm  TEMP: ( )  RESP:  Physical Exam  Musculoskeletal:  Patient has difficulty with abduction and forward flexion extension Duda pain.  All planes really give her pain.  We can hold the shoulder up in place for part of the test and she does have good strength when doing that.  Inspection reveals no atrophy of the bilateral APB or FDI or hand intrinsics. There is no swelling, color changes, allodynia or  dystrophic changes. There is 5 out of 5 strength in the bilateral wrist extension, finger abduction and long finger flexion. There is intact sensation to light touch in all dermatomal and peripheral nerve distributions.  There is a negative Phalen's test bilaterally. There is a negative Hoffmann's test bilaterally.    Ortho Exam Imaging: No results found.  Past Medical/Family/Surgical/Social History: Medications & Allergies reviewed per EMR, new medications updated. Patient Active Problem List   Diagnosis Date Noted  . PVD (peripheral vascular disease) with claudication ([place]) 05/24/2012  . Peripheral vascular disease, unspecified ([place]) 11/24/2011  . HYPERCHOLESTEROLEMIA 04/21/2006  . OBESITY, NOS 04/21/2006  . DEPRESSION, MAJOR, RECURRENT 04/21/2006  . TOBACCO DEPENDENCE 04/21/2006  . HYPERTENSION, BENIGN SYSTEMIC 04/21/2006  . HAND PAIN 04/21/2006   Past Medical History:  Diagnosis Date  . Depression   . High cholesterol   . Hypertension   . Stroke [facility])    Family History  Problem Relation Age of Onset  . Cancer Mother   . Diabetes Mother   . Heart disease Mother   . Hypertension Mother   . Heart attack Mother   . Hyperlipidemia Mother   . Hypertension Father   . Diabetes Sister   . Heart disease Sister   . Hyperlipidemia Sister   . Hypertension Sister   . Cancer Sister   . Diabetes Brother   . Heart disease Brother   . Hypertension Brother   . Cancer Brother   . Hyperlipidemia Brother   . Heart disease Son   . Heart attack  Son        amputation   Past Surgical History:  Procedure Laterality Date  . ABDOMINAL HYSTERECTOMY    . catheterization of the left superficial femoral artery  10/20/2009   Social History   Occupational History  . Not on file  Tobacco Use  . Smoking status: Current Every Day Smoker    Packs/day: 0.50    Types: Cigarettes  . Smokeless tobacco: Never Used  . Tobacco comment: using nic patches  Substance and Sexual Activity  .  Alcohol use: Yes    Alcohol/week: 0.0 - 1.0 standard drinks  . Drug use: No  . Sexual activity: Not on file

## 2017-11-21 ENCOUNTER — Encounter (INDEPENDENT_AMBULATORY_CARE_PROVIDER_SITE_OTHER): Payer: Self-pay | Admitting: Physician Assistant

## 2017-11-21 ENCOUNTER — Ambulatory Visit (INDEPENDENT_AMBULATORY_CARE_PROVIDER_SITE_OTHER): Payer: Medicare Other | Admitting: Physician Assistant

## 2017-11-21 DIAGNOSIS — M792 Neuralgia and neuritis, unspecified: Secondary | ICD-10-CM | POA: Diagnosis not present

## 2017-11-21 DIAGNOSIS — M25512 Pain in left shoulder: Secondary | ICD-10-CM | POA: Diagnosis not present

## 2017-11-21 NOTE — Progress Notes (Signed)
HPI: Mrs. Reddix returns today for left shoulder pain.  She is here to review MRI and EMG nerve conduction studies.  States she has had no change in her symptoms.  She had an injury to her left shoulder when she was at a restaurant and a barstool fell against her arm.  Since that time she has had increased pain in the left shoulder and arm.  EMG nerve conduction studies upper extremities are essentially normal.  The left shoulder shows moderate tendinosis of the supraspinatus tendon with a high-grade partial thickness bursal surface tear and possibly full thickness small component anteriorly.  Moderate tendinosis of the infraspinatus tendon with a 8 x 10 x 20 mm cyst likely ganglion cyst within the tendon.  Moderate tendinosis of the intra-articular portion of the long head of the biceps.  Physical exam: She has weakness with external rotation against resistance of left shoulder.  Empty can test negative bilaterally.  Actively she can bring her arm to about 90 degrees passively bring her today to approximately 130 degrees.  Positive impingement test on the left.  Impression: Left shoulder pain Radicular pain left arm  Plan: After going over the findings of the MRI and the EMG studies patient would like to speak with her lawyer due to the pending litigation.  Offered her shoulder arthroscopy with extensive debridement versus repeat injection and continued therapy shoulder.  She will let us know how she would like to proceed with this.

## 2017-11-29 ENCOUNTER — Telehealth (INDEPENDENT_AMBULATORY_CARE_PROVIDER_SITE_OTHER): Payer: Self-pay | Admitting: Orthopaedic Surgery

## 2017-11-29 NOTE — Telephone Encounter (Signed)
Patient came into the office would like to proceed with surgery

## 2017-12-09 ENCOUNTER — Telehealth (INDEPENDENT_AMBULATORY_CARE_PROVIDER_SITE_OTHER): Payer: Self-pay | Admitting: Orthopaedic Surgery

## 2017-12-09 NOTE — Telephone Encounter (Signed)
Patient called and stated unclear about where surgery is and has not received packet that was supposed to be sent.  Please call to advise of directions she needs to prepare for surgery.  [phone]

## 2017-12-12 NOTE — Telephone Encounter (Signed)
Spoke with patient and gave info in office today.

## 2017-12-14 ENCOUNTER — Other Ambulatory Visit (INDEPENDENT_AMBULATORY_CARE_PROVIDER_SITE_OTHER): Payer: Self-pay

## 2017-12-14 NOTE — Progress Notes (Signed)
error 

## 2017-12-19 ENCOUNTER — Telehealth (INDEPENDENT_AMBULATORY_CARE_PROVIDER_SITE_OTHER): Payer: Self-pay

## 2017-12-19 NOTE — Telephone Encounter (Signed)
This is just 81 mg aspirin so she can stay on this aspirin prior to surgery.

## 2017-12-19 NOTE — Telephone Encounter (Signed)
Hi Oziel Beitler,  Pt [name] for 10/31 takes 81 mg ASA qd. She has hx of PVD and TIA. I cannot tell her to hold asa; instruction for Aspirin will need to come from MD. Can you please instruct pt on aspirin prior to sx, once you confer with Dr. Magnus Ivan? Thanks!  Thank you,  Carolanne Grumbling RN BSN (Pre-op Assessment) [phone] ext [phone]

## 2017-12-19 NOTE — Telephone Encounter (Signed)
I called patient and left voice mail for return call. °

## 2017-12-21 NOTE — Telephone Encounter (Signed)
Spoke with patient and advised

## 2017-12-27 ENCOUNTER — Telehealth (INDEPENDENT_AMBULATORY_CARE_PROVIDER_SITE_OTHER): Payer: Self-pay | Admitting: Orthopaedic Surgery

## 2017-12-27 NOTE — Telephone Encounter (Signed)
This patient came in stating she was cleared for surgery and wanted to r/s.  Please call her @[phone]

## 2017-12-30 NOTE — Telephone Encounter (Signed)
I called and left voice mail for patient to return my call. 

## 2018-01-06 NOTE — Telephone Encounter (Signed)
I called patient yesterday afternoon and left voice mail for return call.

## 2018-01-10 ENCOUNTER — Telehealth (INDEPENDENT_AMBULATORY_CARE_PROVIDER_SITE_OTHER): Payer: Self-pay | Admitting: Orthopaedic Surgery

## 2018-01-10 NOTE — Telephone Encounter (Signed)
Message sent in error

## 2018-01-10 NOTE — Telephone Encounter (Signed)
Patient called stated she cancelled appt in Oct. Wanted to r/s surgery asap.  Please call patient =@ [phone]

## 2018-01-11 NOTE — Telephone Encounter (Signed)
Spoke with patient yesterday and rescheduled surgery. ?

## 2018-01-13 NOTE — Progress Notes (Signed)
Please place orders in Epic as patient has a pre-op appointment on 01/17/2018! Thank you! 

## 2018-01-16 ENCOUNTER — Other Ambulatory Visit (INDEPENDENT_AMBULATORY_CARE_PROVIDER_SITE_OTHER): Payer: Self-pay | Admitting: Physician Assistant

## 2018-01-16 ENCOUNTER — Encounter (HOSPITAL_COMMUNITY): Payer: Self-pay

## 2018-01-16 NOTE — Patient Instructions (Addendum)
[name]  01/16/2018   Your procedure is scheduled on: 01-27-18  Report to [facility] Main  Entrance  Report to admitting at    1020  AM    Call this number if you have problems the morning of surgery [phone]    Remember: Do not eat food  :After Midnight.you may have clear liquids until 0720 am then nothing by mouth   BRUSH YOUR TEETH MORNING OF SURGERY AND RINSE YOUR MOUTH OUT, NO CHEWING GUM CANDY OR MINTS.     Take these medicines the morning of surgery with A SIP OF WATER: metoprolol, zetia     CLEAR LIQUID DIET   Foods Allowed                                                                     Foods Excluded  Coffee and tea, regular and decaf                             liquids that you cannot  Plain Jell-O in any flavor                                             see through such as: Fruit ices (not with fruit pulp)                                     milk, soups, orange juice  Iced Popsicles                                    All solid food Carbonated beverages, regular and diet                                    Cranberry, grape and apple juices Sports drinks like Gatorade Lightly seasoned clear broth or consume(fat free) Sugar, honey syrup  __________________________________________________________________, lexapro, wellbutrin, amlodipine                                You may not have any metal on your body including hair pins and              piercings  Do not wear jewelry, make-up, lotions, powders or perfumes, deodorant             Do not wear nail polish.  Do not shave  48 hours prior to surgery.     Do not bring valuables to the hospital. [facility] IS NOT             RESPONSIBLE   FOR VALUABLES.  Contacts, dentures or bridgework may not be worn into surgery.      Patients discharged the day of surgery will not be allowed  to drive home.  Name and phone number of your driver:  Special Instructions: N/A               Please read over the following fact sheets you were given: _____________________________________________________________________           [facility] Before surgery, you can play an important role.  Because skin is not sterile, your skin needs to be as free of germs as possible.  You can reduce the number of germs on your skin by washing with CHG (chlorahexidine gluconate) soap before surgery.  CHG is an antiseptic cleaner which kills germs and bonds with the skin to continue killing germs even after washing. Please DO NOT use if you have an allergy to CHG or antibacterial soaps.  If your skin becomes reddened/irritated stop using the CHG and inform your nurse when you arrive at Short Stay. Do not shave (including legs and underarms) for at least 48 hours prior to the first CHG shower.  You may shave your face/neck. Please follow these instructions carefully:  1.  Shower with CHG Soap the night before surgery and the  morning of Surgery.  2.  If you choose to wash your hair, wash your hair first as usual with your  normal  shampoo.  3.  After you shampoo, rinse your hair and body thoroughly to remove the  shampoo.                           4.  Use CHG as you would any other liquid soap.  You can apply chg directly  to the skin and wash                       Gently with a scrungie or clean washcloth.  5.  Apply the CHG Soap to your body ONLY FROM THE NECK DOWN.   Do not use on face/ open                           Wound or open sores. Avoid contact with eyes, ears mouth and genitals (private parts).                       Wash face,  Genitals (private parts) with your normal soap.             6.  Wash thoroughly, paying special attention to the area where your surgery  will be performed.  7.  Thoroughly rinse your body with warm water from the neck down.  8.  DO NOT shower/wash with your normal soap after using and rinsing off  the CHG Soap.                9.  Pat yourself  dry with a clean towel.            10.  Wear clean pajamas.            11.  Place clean sheets on your bed the night of your first shower and do not  sleep with pets. Day of Surgery : Do not apply any lotions/deodorants the morning of surgery.  Please wear clean clothes to the hospital/surgery center.  FAILURE TO FOLLOW THESE INSTRUCTIONS MAY RESULT IN THE CANCELLATION OF YOUR SURGERY PATIENT SIGNATURE_________________________________  NURSE SIGNATURE__________________________________  ________________________________________________________________________   Rogelia MireIncentive Spirometer  An incentive spirometer  is a tool that can help keep your lungs clear and active. This tool measures how well you are filling your lungs with each breath. Taking long deep breaths may help reverse or decrease the chance of developing breathing (pulmonary) problems (especially infection) following:  A long period of time when you are unable to move or be active. BEFORE THE PROCEDURE   If the spirometer includes an indicator to show your best effort, your nurse or respiratory therapist will set it to a desired goal.  If possible, sit up straight or lean slightly forward. Try not to slouch.  Hold the incentive spirometer in an upright position. INSTRUCTIONS FOR USE  1. Sit on the edge of your bed if possible, or sit up as far as you can in bed or on a chair. 2. Hold the incentive spirometer in an upright position. 3. Breathe out normally. 4. Place the mouthpiece in your mouth and seal your lips tightly around it. 5. Breathe in slowly and as deeply as possible, raising the piston or the ball toward the top of the column. 6. Hold your breath for 3-5 seconds or for as long as possible. Allow the piston or ball to fall to the bottom of the column. 7. Remove the mouthpiece from your mouth and breathe out normally. 8. Rest for a few seconds and repeat Steps 1 through 7 at least 10 times every 1-2 hours when you are  awake. Take your time and take a few normal breaths between deep breaths. 9. The spirometer may include an indicator to show your best effort. Use the indicator as a goal to work toward during each repetition. 10. After each set of 10 deep breaths, practice coughing to be sure your lungs are clear. If you have an incision (the cut made at the time of surgery), support your incision when coughing by placing a pillow or rolled up towels firmly against it. Once you are able to get out of bed, walk around indoors and cough well. You may stop using the incentive spirometer when instructed by your caregiver.  RISKS AND COMPLICATIONS  Take your time so you do not get dizzy or light-headed.  If you are in pain, you may need to take or ask for pain medication before doing incentive spirometry. It is harder to take a deep breath if you are having pain. AFTER USE  Rest and breathe slowly and easily.  It can be helpful to keep track of a log of your progress. Your caregiver can provide you with a simple table to help with this. If you are using the spirometer at home, follow these instructions: SEEK MEDICAL CARE IF:   You are having difficultly using the spirometer.  You have trouble using the spirometer as often as instructed.  Your pain medication is not giving enough relief while using the spirometer.  You develop fever of 100.5 F (38.1 C) or higher. SEEK IMMEDIATE MEDICAL CARE IF:   You cough up bloody sputum that had not been present before.  You develop fever of 102 F (38.9 C) or greater.  You develop worsening pain at or near the incision site. MAKE SURE YOU:   Understand these instructions.  Will watch your condition.  Will get help right away if you are not doing well or get worse. Document Released: 06/21/2006 Document Revised: 05/03/2011 Document Reviewed: 08/22/2006 [facility] Patient Information 2014 [place],  [place].   ________________________________________________________________________

## 2018-01-17 ENCOUNTER — Encounter (HOSPITAL_COMMUNITY)
Admission: RE | Admit: 2018-01-17 | Discharge: 2018-01-17 | Disposition: A | Discharge status: Home / Self Care | Payer: Medicare Other | Source: Ambulatory Visit | Attending: Orthopaedic Surgery | Admitting: Orthopaedic Surgery

## 2018-01-17 ENCOUNTER — Encounter (HOSPITAL_COMMUNITY): Payer: Self-pay

## 2018-01-17 ENCOUNTER — Other Ambulatory Visit: Payer: Self-pay

## 2018-01-17 DIAGNOSIS — I517 Cardiomegaly: Secondary | ICD-10-CM | POA: Insufficient documentation

## 2018-01-17 DIAGNOSIS — M75112 Incomplete rotator cuff tear or rupture of left shoulder, not specified as traumatic: Secondary | ICD-10-CM | POA: Insufficient documentation

## 2018-01-17 DIAGNOSIS — Z01818 Encounter for other preprocedural examination: Secondary | ICD-10-CM | POA: Diagnosis not present

## 2018-01-17 HISTORY — DX: Pneumonia, unspecified organism: J18.9

## 2018-01-17 HISTORY — DX: Unspecified osteoarthritis, unspecified site: M19.90

## 2018-01-17 LAB — BASIC METABOLIC PANEL
Anion gap: 6 (ref 5–15)
BUN: 11 mg/dL (ref 8–23)
CHLORIDE: 105 mmol/L (ref 98–111)
CO2: 33 mmol/L — ABNORMAL HIGH (ref 22–32)
Calcium: 9 mg/dL (ref 8.9–10.3)
Creatinine, Ser: 0.87 mg/dL (ref 0.44–1.00)
Glucose, Bld: 102 mg/dL — ABNORMAL HIGH (ref 70–99)
POTASSIUM: 4 mmol/L (ref 3.5–5.1)
SODIUM: 144 mmol/L (ref 135–145)

## 2018-01-17 LAB — CBC
HCT: 48.4 % — ABNORMAL HIGH (ref 36.0–46.0)
HEMOGLOBIN: 13.9 g/dL (ref 12.0–15.0)
MCH: 25.2 pg — ABNORMAL LOW (ref 26.0–34.0)
MCHC: 28.7 g/dL — ABNORMAL LOW (ref 30.0–36.0)
MCV: 87.8 fL (ref 80.0–100.0)
NRBC: 0 % (ref 0.0–0.2)
Platelets: 246 10*3/uL (ref 150–400)
RBC: 5.51 MIL/uL — ABNORMAL HIGH (ref 3.87–5.11)
RDW: 13.7 % (ref 11.5–15.5)
WBC: 4.5 10*3/uL (ref 4.0–10.5)

## 2018-01-27 ENCOUNTER — Encounter (HOSPITAL_COMMUNITY): Payer: Self-pay | Admitting: *Deleted

## 2018-01-27 ENCOUNTER — Other Ambulatory Visit: Payer: Self-pay

## 2018-01-27 ENCOUNTER — Observation Stay (HOSPITAL_COMMUNITY)
Admission: RE | Admit: 2018-01-27 | Discharge: 2018-01-28 | Disposition: A | Discharge status: Home / Self Care | Payer: Medicare Other | Source: Ambulatory Visit | Attending: Orthopaedic Surgery | Admitting: Orthopaedic Surgery

## 2018-01-27 ENCOUNTER — Encounter (HOSPITAL_COMMUNITY)
Admission: RE | Disposition: A | Discharge status: Home / Self Care | Payer: Self-pay | Source: Ambulatory Visit | Attending: Orthopaedic Surgery

## 2018-01-27 ENCOUNTER — Ambulatory Visit (HOSPITAL_COMMUNITY): Payer: Medicare Other | Admitting: Anesthesiology

## 2018-01-27 DIAGNOSIS — Z79899 Other long term (current) drug therapy: Secondary | ICD-10-CM | POA: Diagnosis not present

## 2018-01-27 DIAGNOSIS — S46012A Strain of muscle(s) and tendon(s) of the rotator cuff of left shoulder, initial encounter: Secondary | ICD-10-CM

## 2018-01-27 DIAGNOSIS — W19XXXA Unspecified fall, initial encounter: Secondary | ICD-10-CM | POA: Diagnosis not present

## 2018-01-27 DIAGNOSIS — S43422A Sprain of left rotator cuff capsule, initial encounter: Principal | ICD-10-CM | POA: Insufficient documentation

## 2018-01-27 DIAGNOSIS — M75122 Complete rotator cuff tear or rupture of left shoulder, not specified as traumatic: Secondary | ICD-10-CM | POA: Diagnosis present

## 2018-01-27 DIAGNOSIS — Z7982 Long term (current) use of aspirin: Secondary | ICD-10-CM | POA: Insufficient documentation

## 2018-01-27 DIAGNOSIS — I10 Essential (primary) hypertension: Secondary | ICD-10-CM | POA: Diagnosis not present

## 2018-01-27 DIAGNOSIS — Z91013 Allergy to seafood: Secondary | ICD-10-CM | POA: Insufficient documentation

## 2018-01-27 DIAGNOSIS — Z8673 Personal history of transient ischemic attack (TIA), and cerebral infarction without residual deficits: Secondary | ICD-10-CM | POA: Diagnosis not present

## 2018-01-27 DIAGNOSIS — E7800 Pure hypercholesterolemia, unspecified: Secondary | ICD-10-CM | POA: Diagnosis not present

## 2018-01-27 DIAGNOSIS — Z880 Allergy status to penicillin: Secondary | ICD-10-CM | POA: Insufficient documentation

## 2018-01-27 DIAGNOSIS — F329 Major depressive disorder, single episode, unspecified: Secondary | ICD-10-CM | POA: Diagnosis not present

## 2018-01-27 DIAGNOSIS — Z9889 Other specified postprocedural states: Secondary | ICD-10-CM

## 2018-01-27 DIAGNOSIS — Z888 Allergy status to other drugs, medicaments and biological substances status: Secondary | ICD-10-CM | POA: Insufficient documentation

## 2018-01-27 DIAGNOSIS — Z87891 Personal history of nicotine dependence: Secondary | ICD-10-CM | POA: Diagnosis not present

## 2018-01-27 HISTORY — PX: SHOULDER ARTHROSCOPY WITH ROTATOR CUFF REPAIR AND SUBACROMIAL DECOMPRESSION: SHX5686

## 2018-01-27 SURGERY — SHOULDER ARTHROSCOPY WITH ROTATOR CUFF REPAIR AND SUBACROMIAL DECOMPRESSION
Anesthesia: General | Site: Shoulder | Laterality: Left

## 2018-01-27 MED ORDER — ACETAMINOPHEN 325 MG PO TABS: 325.0000 mg | ORAL_TABLET | Freq: Four times a day (QID) | ORAL | Status: DC | PRN

## 2018-01-27 MED ORDER — SODIUM CHLORIDE 0.9 % IV SOLN
INTRAVENOUS | Status: DC
  Administered 2018-01-27: 18:00:00 via INTRAVENOUS

## 2018-01-27 MED ORDER — ASPIRIN EC 81 MG PO TBEC
81.0000 mg | DELAYED_RELEASE_TABLET | Freq: Every day | ORAL | Status: DC
  Administered 2018-01-28: 81 mg via ORAL
  Filled 2018-01-27: qty 1

## 2018-01-27 MED ORDER — SUCCINYLCHOLINE CHLORIDE 20 MG/ML IJ SOLN
INTRAMUSCULAR | Status: DC | PRN
  Administered 2018-01-27: 100 mg via INTRAVENOUS

## 2018-01-27 MED ORDER — ONDANSETRON HCL 4 MG PO TABS: 4.0000 mg | ORAL_TABLET | Freq: Four times a day (QID) | ORAL | Status: DC | PRN

## 2018-01-27 MED ORDER — METHOCARBAMOL 500 MG IVPB - SIMPLE MED
500.0000 mg | Freq: Four times a day (QID) | INTRAVENOUS | Status: DC | PRN
  Filled 2018-01-27: qty 50

## 2018-01-27 MED ORDER — LACTATED RINGERS IR SOLN
Status: DC | PRN
  Administered 2018-01-27: 6000 mL

## 2018-01-27 MED ORDER — SUCCINYLCHOLINE CHLORIDE 200 MG/10ML IV SOSY
PREFILLED_SYRINGE | INTRAVENOUS | Status: AC
  Filled 2018-01-27: qty 10

## 2018-01-27 MED ORDER — CLINDAMYCIN PHOSPHATE 600 MG/50ML IV SOLN
600.0000 mg | Freq: Four times a day (QID) | INTRAVENOUS | Status: AC
  Administered 2018-01-27 – 2018-01-28 (×3): 600 mg via INTRAVENOUS
  Filled 2018-01-27 (×3): qty 50

## 2018-01-27 MED ORDER — SUGAMMADEX SODIUM 200 MG/2ML IV SOLN
INTRAVENOUS | Status: DC | PRN
  Administered 2018-01-27: 200 mg via INTRAVENOUS

## 2018-01-27 MED ORDER — ONDANSETRON HCL 4 MG/2ML IJ SOLN
INTRAMUSCULAR | Status: AC
  Filled 2018-01-27: qty 2

## 2018-01-27 MED ORDER — CHLORHEXIDINE GLUCONATE 4 % EX LIQD: 60.0000 mL | Freq: Once | CUTANEOUS | Status: DC

## 2018-01-27 MED ORDER — PHENYLEPHRINE HCL 10 MG/ML IJ SOLN
INTRAMUSCULAR | Status: DC | PRN
  Administered 2018-01-27: 100 ug via INTRAVENOUS
  Administered 2018-01-27: 120 ug via INTRAVENOUS
  Administered 2018-01-27: 100 ug via INTRAVENOUS
  Administered 2018-01-27: 80 ug via INTRAVENOUS

## 2018-01-27 MED ORDER — BUPIVACAINE-EPINEPHRINE (PF) 0.25% -1:200000 IJ SOLN
INTRAMUSCULAR | Status: AC
  Filled 2018-01-27: qty 30

## 2018-01-27 MED ORDER — GLYCOPYRROLATE 0.2 MG/ML IJ SOLN
INTRAMUSCULAR | Status: DC | PRN
  Administered 2018-01-27: 0.2 mg via INTRAVENOUS

## 2018-01-27 MED ORDER — METHOCARBAMOL 500 MG PO TABS: 500.0000 mg | ORAL_TABLET | Freq: Four times a day (QID) | ORAL | Status: DC | PRN

## 2018-01-27 MED ORDER — AMLODIPINE BESYLATE 10 MG PO TABS: 10.0000 mg | ORAL_TABLET | Freq: Every day | ORAL | Status: DC

## 2018-01-27 MED ORDER — ROCURONIUM BROMIDE 10 MG/ML (PF) SYRINGE
PREFILLED_SYRINGE | INTRAVENOUS | Status: AC
  Filled 2018-01-27: qty 10

## 2018-01-27 MED ORDER — PROMETHAZINE HCL 25 MG/ML IJ SOLN: 6.2500 mg | INTRAMUSCULAR | Status: DC | PRN

## 2018-01-27 MED ORDER — MORPHINE SULFATE (PF) 2 MG/ML IV SOLN: 0.5000 mg | INTRAVENOUS | Status: DC | PRN

## 2018-01-27 MED ORDER — ATORVASTATIN CALCIUM 10 MG PO TABS
10.0000 mg | ORAL_TABLET | Freq: Every day | ORAL | Status: DC
  Administered 2018-01-27: 10 mg via ORAL
  Filled 2018-01-27: qty 1

## 2018-01-27 MED ORDER — SODIUM CHLORIDE 0.9 % IV SOLN
INTRAVENOUS | Status: DC | PRN
  Administered 2018-01-27: 100 ug/min via INTRAVENOUS

## 2018-01-27 MED ORDER — METOPROLOL TARTRATE 50 MG PO TABS
100.0000 mg | ORAL_TABLET | Freq: Two times a day (BID) | ORAL | Status: DC
  Administered 2018-01-28: 100 mg via ORAL
  Filled 2018-01-27 (×2): qty 2

## 2018-01-27 MED ORDER — PROPOFOL 10 MG/ML IV BOLUS
INTRAVENOUS | Status: AC
  Filled 2018-01-27: qty 20

## 2018-01-27 MED ORDER — EPINEPHRINE PF 1 MG/ML IJ SOLN
INTRAMUSCULAR | Status: AC
  Filled 2018-01-27: qty 2

## 2018-01-27 MED ORDER — ALBUTEROL SULFATE HFA 108 (90 BASE) MCG/ACT IN AERS
INHALATION_SPRAY | RESPIRATORY_TRACT | Status: DC | PRN
  Administered 2018-01-27: 5 via RESPIRATORY_TRACT

## 2018-01-27 MED ORDER — METOCLOPRAMIDE HCL 5 MG/ML IJ SOLN: 5.0000 mg | Freq: Three times a day (TID) | INTRAMUSCULAR | Status: DC | PRN

## 2018-01-27 MED ORDER — FENTANYL CITRATE (PF) 100 MCG/2ML IJ SOLN: 25.0000 ug | INTRAMUSCULAR | Status: DC | PRN

## 2018-01-27 MED ORDER — DIPHENHYDRAMINE HCL 12.5 MG/5ML PO ELIX: 12.5000 mg | ORAL_SOLUTION | ORAL | Status: DC | PRN

## 2018-01-27 MED ORDER — EZETIMIBE 10 MG PO TABS
10.0000 mg | ORAL_TABLET | Freq: Every day | ORAL | Status: DC
  Administered 2018-01-27 – 2018-01-28 (×2): 10 mg via ORAL
  Filled 2018-01-27 (×2): qty 1

## 2018-01-27 MED ORDER — HYDROCODONE-ACETAMINOPHEN 5-325 MG PO TABS
1.0000 | ORAL_TABLET | ORAL | Status: DC | PRN
  Administered 2018-01-28: 1 via ORAL
  Filled 2018-01-27: qty 1

## 2018-01-27 MED ORDER — DOCUSATE SODIUM 100 MG PO CAPS
100.0000 mg | ORAL_CAPSULE | Freq: Two times a day (BID) | ORAL | Status: DC
  Administered 2018-01-27 – 2018-01-28 (×2): 100 mg via ORAL
  Filled 2018-01-27 (×2): qty 1

## 2018-01-27 MED ORDER — BUPIVACAINE LIPOSOME 1.3 % IJ SUSP
INTRAMUSCULAR | Status: DC | PRN
  Administered 2018-01-27: 10 mL via PERINEURAL

## 2018-01-27 MED ORDER — VITAMIN D 25 MCG (1000 UNIT) PO TABS
2000.0000 [IU] | ORAL_TABLET | Freq: Every day | ORAL | Status: DC
  Administered 2018-01-28: 2000 [IU] via ORAL
  Filled 2018-01-27: qty 2

## 2018-01-27 MED ORDER — CLINDAMYCIN PHOSPHATE 900 MG/50ML IV SOLN
900.0000 mg | INTRAVENOUS | Status: AC
  Administered 2018-01-27: 900 mg via INTRAVENOUS
  Filled 2018-01-27: qty 50

## 2018-01-27 MED ORDER — MIDAZOLAM HCL 2 MG/2ML IJ SOLN
1.0000 mg | Freq: Once | INTRAMUSCULAR | Status: AC
  Administered 2018-01-27: 1 mg via INTRAVENOUS
  Filled 2018-01-27: qty 2

## 2018-01-27 MED ORDER — ONDANSETRON HCL 4 MG/2ML IJ SOLN
INTRAMUSCULAR | Status: DC | PRN
  Administered 2018-01-27: 4 mg via INTRAVENOUS

## 2018-01-27 MED ORDER — FENTANYL CITRATE (PF) 100 MCG/2ML IJ SOLN
50.0000 ug | Freq: Once | INTRAMUSCULAR | Status: AC
  Administered 2018-01-27: 50 ug via INTRAVENOUS
  Filled 2018-01-27: qty 2

## 2018-01-27 MED ORDER — HYDROCODONE-ACETAMINOPHEN 7.5-325 MG PO TABS: 1.0000 | ORAL_TABLET | ORAL | Status: DC | PRN

## 2018-01-27 MED ORDER — CLONIDINE HCL (ANALGESIA) 100 MCG/ML EP SOLN
EPIDURAL | Status: DC | PRN
  Administered 2018-01-27: 50 ug

## 2018-01-27 MED ORDER — PROPOFOL 10 MG/ML IV BOLUS
INTRAVENOUS | Status: DC | PRN
  Administered 2018-01-27: 40 mg via INTRAVENOUS
  Administered 2018-01-27: 50 mg via INTRAVENOUS

## 2018-01-27 MED ORDER — ONDANSETRON HCL 4 MG/2ML IJ SOLN: 4.0000 mg | Freq: Four times a day (QID) | INTRAMUSCULAR | Status: DC | PRN

## 2018-01-27 MED ORDER — LISINOPRIL 20 MG PO TABS
40.0000 mg | ORAL_TABLET | Freq: Every day | ORAL | Status: DC
  Administered 2018-01-27: 40 mg via ORAL
  Filled 2018-01-27: qty 2

## 2018-01-27 MED ORDER — LIDOCAINE 2% (20 MG/ML) 5 ML SYRINGE
INTRAMUSCULAR | Status: AC
  Filled 2018-01-27: qty 5

## 2018-01-27 MED ORDER — BUPROPION HCL ER (XL) 150 MG PO TB24
150.0000 mg | ORAL_TABLET | Freq: Every day | ORAL | Status: DC
  Administered 2018-01-27 – 2018-01-28 (×2): 150 mg via ORAL
  Filled 2018-01-27 (×2): qty 1

## 2018-01-27 MED ORDER — LACTATED RINGERS IV SOLN
INTRAVENOUS | Status: DC
  Administered 2018-01-27 (×2): via INTRAVENOUS

## 2018-01-27 MED ORDER — ROCURONIUM BROMIDE 100 MG/10ML IV SOLN
INTRAVENOUS | Status: DC | PRN
  Administered 2018-01-27: 50 mg via INTRAVENOUS

## 2018-01-27 MED ORDER — METOCLOPRAMIDE HCL 5 MG PO TABS: 5.0000 mg | ORAL_TABLET | Freq: Three times a day (TID) | ORAL | Status: DC | PRN

## 2018-01-27 MED ORDER — ESCITALOPRAM OXALATE 10 MG PO TABS
10.0000 mg | ORAL_TABLET | Freq: Every day | ORAL | Status: DC
  Administered 2018-01-27 – 2018-01-28 (×2): 10 mg via ORAL
  Filled 2018-01-27 (×2): qty 1

## 2018-01-27 MED ORDER — PHENYLEPHRINE 40 MCG/ML (10ML) SYRINGE FOR IV PUSH (FOR BLOOD PRESSURE SUPPORT)
PREFILLED_SYRINGE | INTRAVENOUS | Status: AC
  Filled 2018-01-27: qty 10

## 2018-01-27 MED ORDER — HYDROCHLOROTHIAZIDE 25 MG PO TABS: 25.0000 mg | ORAL_TABLET | Freq: Every day | ORAL | Status: DC

## 2018-01-27 MED ORDER — BUPIVACAINE HCL (PF) 0.5 % IJ SOLN
INTRAMUSCULAR | Status: DC | PRN
  Administered 2018-01-27: 15 mL via PERINEURAL

## 2018-01-27 MED ORDER — ALBUTEROL SULFATE HFA 108 (90 BASE) MCG/ACT IN AERS
INHALATION_SPRAY | RESPIRATORY_TRACT | Status: AC
  Filled 2018-01-27: qty 6.7

## 2018-01-27 MED ORDER — LIDOCAINE HCL (CARDIAC) PF 100 MG/5ML IV SOSY
PREFILLED_SYRINGE | INTRAVENOUS | Status: DC | PRN
  Administered 2018-01-27: 60 mg via INTRAVENOUS

## 2018-01-27 SURGICAL SUPPLY — 46 items
ANCH SUT 2 11X3.5 KNTLS (Anchor) ×1 IMPLANT
ANCHOR SUT POPLOK P1 3.5 (Anchor) ×1 IMPLANT
BLADE CUDA SHAVER 3.5 (BLADE) ×2 IMPLANT
BLADE CUTTER GATOR 3.5 (BLADE) ×2 IMPLANT
BLADE GREAT WHITE 4.2 (BLADE) ×2 IMPLANT
BLADE SURG SZ11 CARB STEEL (BLADE) ×2 IMPLANT
BUR OVAL 4.0 (BURR) ×2 IMPLANT
CANNULA TWIST IN 8.25X7CM (CANNULA) ×1 IMPLANT
COVER SURGICAL LIGHT HANDLE (MISCELLANEOUS) ×2 IMPLANT
COVER WAND RF STERILE (DRAPES) ×1 IMPLANT
DRAPE SHOULDER BEACH CHAIR (DRAPES) ×2 IMPLANT
DRAPE U-SHAPE 47X51 STRL (DRAPES) ×4 IMPLANT
DRSG PAD ABDOMINAL 8X10 ST (GAUZE/BANDAGES/DRESSINGS) ×4 IMPLANT
DURAPREP 26ML APPLICATOR (WOUND CARE) ×2 IMPLANT
GAUZE SPONGE 4X4 12PLY STRL (GAUZE/BANDAGES/DRESSINGS) ×2 IMPLANT
GAUZE XEROFORM 1X8 LF (GAUZE/BANDAGES/DRESSINGS) ×2 IMPLANT
GLOVE BIO SURGEON STRL SZ7.5 (GLOVE) ×2 IMPLANT
GLOVE BIOGEL PI IND STRL 8 (GLOVE) ×2 IMPLANT
GLOVE BIOGEL PI INDICATOR 8 (GLOVE) ×2
GLOVE ECLIPSE 8.0 STRL XLNG CF (GLOVE) ×2 IMPLANT
GOWN STRL REUS W/TWL XL LVL3 (GOWN DISPOSABLE) ×4 IMPLANT
KIT BASIN OR (CUSTOM PROCEDURE TRAY) ×2 IMPLANT
KIT POSITION SHOULDER SCHLEI (MISCELLANEOUS) ×2 IMPLANT
KIT SHOULDER TRACTION (DRAPES) ×2 IMPLANT
MANIFOLD NEPTUNE II (INSTRUMENTS) ×2 IMPLANT
NDL SCORPION MULTI FIRE (NEEDLE) IMPLANT
NDL SPNL 18GX3.5 QUINCKE PK (NEEDLE) ×1 IMPLANT
NEEDLE SCORPION MULTI FIRE (NEEDLE) ×2 IMPLANT
NEEDLE SPNL 18GX3.5 QUINCKE PK (NEEDLE) ×2 IMPLANT
PACK SHOULDER (CUSTOM PROCEDURE TRAY) ×2 IMPLANT
PAD ABD 8X10 STRL (GAUZE/BANDAGES/DRESSINGS) ×1 IMPLANT
PROBE BIPOLAR ATHRO 135MM 90D (MISCELLANEOUS) ×1 IMPLANT
PROTECTOR NERVE ULNAR (MISCELLANEOUS) ×2 IMPLANT
SLING ARM FOAM STRAP LRG (SOFTGOODS) ×1 IMPLANT
SLING ARM IMMOBILIZER LRG (SOFTGOODS) IMPLANT
SLING ARM IMMOBILIZER MED (SOFTGOODS) IMPLANT
SUT ETHILON 0 48 LOOP BLK (SUTURE) ×2 IMPLANT
SUT ETHILON 2 0 PS N (SUTURE) ×1 IMPLANT
SUT ETHILON 4 0 PS 2 18 (SUTURE) ×2 IMPLANT
SUT HI-FI 2 STRAND C-2 40 (SUTURE) ×1 IMPLANT
TAPE CLOTH SURG 6X10 WHT LF (GAUZE/BANDAGES/DRESSINGS) ×1 IMPLANT
TOWEL OR 17X26 10 PK STRL BLUE (TOWEL DISPOSABLE) ×2 IMPLANT
TOWEL OR NON WOVEN STRL DISP B (DISPOSABLE) ×2 IMPLANT
TUBING ARTHRO INFLOW-ONLY STRL (TUBING) ×2 IMPLANT
TUBING CONNECTING 10 (TUBING) ×2 IMPLANT
WAND HAND CNTRL MULTIVAC 90 (MISCELLANEOUS) ×2 IMPLANT

## 2018-01-27 NOTE — Anesthesia Procedure Notes (Signed)
Procedure Name: Intubation Date/Time: 01/27/2018 1:03 PM Performed by: Jonna Munro, CRNA Pre-anesthesia Checklist: Patient identified, Emergency Drugs available, Suction available, Patient being monitored and Timeout performed Patient Re-evaluated:Patient Re-evaluated prior to induction Oxygen Delivery Method: Circle system utilized Preoxygenation: Pre-oxygenation with 100% oxygen Induction Type: IV induction Ventilation: Mask ventilation without difficulty Laryngoscope Size: Mac and 3 Grade View: Grade I Tube type: Oral Tube size: 7.5 mm Number of attempts: 1 Airway Equipment and Method: Stylet Placement Confirmation: ETT inserted through vocal cords under direct vision,  positive ETCO2 and breath sounds checked- equal and bilateral Secured at: 22 cm Tube secured with: Tape Dental Injury: Teeth and Oropharynx as per pre-operative assessment

## 2018-01-27 NOTE — Op Note (Signed)
NAME: [name], Barbara E. MEDICAL RECORD NW:[id number] ACCOUNT [id number] DATE OF BIRTH:Mar 17, 1940 FACILITY: [facility] LOCATION: [facility] PHYSICIAN:Elise Knobloch Aretha ParrotY. Owain Eckerman, MD  OPERATIVE REPORT  DATE OF PROCEDURE:  01/27/2018  PREOPERATIVE DIAGNOSIS:  Left shoulder full thickness rotator cuff tear.  POSTOPERATIVE DIAGNOSIS:  Left shoulder full thickness rotator cuff tear.  PROCEDURE:  Left shoulder arthroscopy with extensive debridement, subacromial decompression and arthroscopically assisted rotator cuff repair.  SURGEON:  Vanita PandaChristopher Y. Magnus IvanBlackman, MD  ASSISTANT:  Richardean CanalGilbert Clark, PA-C  ANESTHESIA: 1.  Regional left shoulder block. 2.  General.  ESTIMATED BLOOD LOSS:  Minimal.  COMPLICATIONS:  None.  INDICATIONS:  The patient is a 77 year old female who sustained a mechanical fall earlier this year, injuring her left shoulder.  Even at her age of 77, she has not had any shoulder issues before.  She developed shoulder weakness with overhead motion and  abduction.  We tried physical therapy as well as injections in her shoulder, and it got to the point where she is hurting daily and is wanting to have hopes of better shoulder function.  She understands at age 77, her collagen fibers are not strong  enough to support a rotator cuff repair, but certainly subacromial decompression and debridement can help her from a symptomatic standpoint.  DESCRIPTION OF PROCEDURE:  After informed consent was obtained and appropriate left shoulder was marked, anesthesia obtained a regional block in the holding room.  She was brought to the operating room and placed supine on the operating table.  General  anesthesia was then obtained.  She was then fashioned into a beach-chair position with appropriate position of the head and neck and padding of the down nonoperative right arm.  She was bending at the waist and knees and palpable pulses in her feet.  Her  right shoulder was prepped and draped with DuraPrep and  sterile drapes.  It was then placed in in-line skeletal traction using a fishing pole traction device and 10 pounds of traction with neutral rotation and 45 degrees of forward flexion.  Timeout was  called, and she was identified as correct patient, correct left shoulder.  I then made a posterolateral arthroscopy portal and entered the glenohumeral joint.  I readily could see that there was a full thickness rotator cuff tear when looking up at the  undersurface of the rotator cuff.  The subscapularis was intact, but there was some chronic tearing of the anterior labrum and at the anchor of the biceps.  The biceps tendon itself was intact.  Through the rotator interval in the front of the shoulder,  I placed soft tissue ablation wand and arthroscopic shaver and debrided the SLAP tear back to a stable margin.  I then entered the subacromial space through the posterior portal and made a separate lateral portal.  We were able to mobilize the rotator  cuff including inflamed bursal tissue off of this as well as thickened tissue.  Surprisingly, we were able to mobilize the cuff, which was mainly a supraspinatus tear and bring it back to the footprint of the greater tuberosity.  We then prepared the  footprint and then decided to perform a repair after pulling the cuff over, and it did not tear apart.  We thought her bone quality was acceptable as well.  I then used the Scorpion suture passer and placed a single simple horizontal mattress suture in  the front and the back of the supraspinatus part of the cuff and brought this down to a 3.5 Linvatec suture anchor.  We were able to tighten this and brought the cuff back over.  I put the shoulder through internal and external rotation, and it moved as  a unit.  We then cut the sutures at the level of the bone.  We then performed a subacromial decompression with a partial acromioplasty using a high-speed bur.  We then removed all instrumentation from the shoulder.  We  closed the portal sites with  interrupted nylon suture.  A Xeroform well-padded sterile dressing was applied, and she was placed in a shoulder abduction sling.  She was awakened, extubated, and taken to recovery room in stable condition.  All final counts were correct.  There were no  complications noted.   Postoperatively, given her chronic lung issues, we need to keep her overnight for observation.  Of note, Rexene Edison, PA-C, assisted the entire case.  Assistance was crucial for facilitating all aspects of this case.  LN/NUANCE  D:01/27/2018 T:01/27/2018 JOB:[id number]

## 2018-01-27 NOTE — Brief Op Note (Signed)
01/27/2018  2:49 PM  PATIENT:  Barbara NoelEva E Price  77 y.o. female  PRE-OPERATIVE DIAGNOSIS:  left shoulder partial thickness rotator cuff tear  POST-OPERATIVE DIAGNOSIS:  left shoulder partial thickness rotator cuff tear  PROCEDURE:  Procedure(s): LEFT SHOULDER ARTHROSCOPY WITH DEBRIDEMENT, SUBACROMIAL DECOMPRESSION AND ROTATOR CUFF REPAIR (Left)  SURGEON:  Surgeon(s) and Role:    Kathryne Hitch* Julliana Whitmyer Y, MD - Primary  PHYSICIAN ASSISTANT: Rexene EdisonGil Clark, PA-C  ANESTHESIA:   regional and general  COUNTS:  YES  DICTATION: .Other Dictation: Dictation Number [id number]  PLAN OF CARE: Admit for overnight observation  PATIENT DISPOSITION:  PACU - hemodynamically stable.   Delay start of Pharmacological VTE agent (>24hrs) due to surgical blood loss or risk of bleeding: no

## 2018-01-27 NOTE — Anesthesia Procedure Notes (Signed)
Anesthesia Regional Block: Interscalene brachial plexus block   Pre-Anesthetic Checklist: ,, timeout performed, Correct Patient, Correct Site, Correct Laterality, Correct Procedure, Correct Position, site marked, Risks and benefits discussed,  Surgical consent,  Pre-op evaluation,  At surgeon's request and post-op pain management  Laterality: Left  Prep: chloraprep       Needles:  Injection technique: Single-shot  Needle Type: Echogenic Needle     Needle Length: 9cm      Additional Needles:   Procedures:,,,, ultrasound used (permanent image in chart),,,,  Narrative:  Start time: 01/27/2018 12:46 PM End time: 01/27/2018 12:59 PM Injection made incrementally with aspirations every 5 mL.  Performed by: Personally  Anesthesiologist: Eilene Ghaziose, Alanya Vukelich, MD  Additional Notes: Patient tolerated the procedure well without complications

## 2018-01-27 NOTE — Progress Notes (Signed)
Assisted Dr. Rose with left, ultrasound guided, supraclavicular block. Side rails up, monitors on throughout procedure. See vital signs in flow sheet. Tolerated Procedure well. 

## 2018-01-27 NOTE — Anesthesia Preprocedure Evaluation (Signed)
Anesthesia Evaluation  Patient identified by MRN, date of birth, ID band Patient awake    Reviewed: Allergy & Precautions, NPO status , Patient's Chart, lab work & pertinent test results  Airway Mallampati: II  TM Distance: >3 FB Neck ROM: Full    Dental no notable dental hx.    Pulmonary neg pulmonary ROS, former smoker,    Pulmonary exam normal breath sounds clear to auscultation       Cardiovascular hypertension, Normal cardiovascular exam Rhythm:Regular Rate:Normal     Neuro/Psych CVA negative psych ROS   GI/Hepatic negative GI ROS, Neg liver ROS,   Endo/Other  negative endocrine ROS  Renal/GU negative Renal ROS  negative genitourinary   Musculoskeletal negative musculoskeletal ROS (+)   Abdominal   Peds negative pediatric ROS (+)  Hematology negative hematology ROS (+)   Anesthesia Other Findings   Reproductive/Obstetrics negative OB ROS                             Anesthesia Physical Anesthesia Plan  ASA: III  Anesthesia Plan: General   Post-op Pain Management:  Regional for Post-op pain   Induction: Intravenous  PONV Risk Score and Plan: 3 and Ondansetron, Dexamethasone and Treatment may vary due to age or medical condition  Airway Management Planned: Oral ETT  Additional Equipment:   Intra-op Plan:   Post-operative Plan: Extubation in OR  Informed Consent: I have reviewed the patients History and Physical, chart, labs and discussed the procedure including the risks, benefits and alternatives for the proposed anesthesia with the patient or authorized representative who has indicated his/her understanding and acceptance.   Dental advisory given  Plan Discussed with: CRNA and Surgeon  Anesthesia Plan Comments:         Anesthesia Quick Evaluation

## 2018-01-27 NOTE — H&P (Signed)
[name] is an 77 y.o. female.   Chief Complaint:   Left shoulder pain and weakness HPI:   77 yo female who injured her left shoulder after a mechanical fall.  Failed conservative treatment with rest, ice/heat, activity modification, injections and therapy.  A MRI shows a left rotator cuff tear.  She wishes to proceed with surgery due to her continued shoulder disfunction.  Past Medical History:  Diagnosis Date  . Arthritis   . Depression   . High cholesterol   . Hypertension   . Pneumonia   . Stroke [facility])    1994 no residuals    Past Surgical History:  Procedure Laterality Date  . ABDOMINAL HYSTERECTOMY    . catheterization of the left superficial femoral artery  10/20/2009  . SHOULDER SURGERY     Left with dr. Gayla Doss arthroscopy with possible rotator cuff repair  . TONSILLECTOMY      Family History  Problem Relation Age of Onset  . Cancer Mother   . Diabetes Mother   . Heart disease Mother   . Hypertension Mother   . Heart attack Mother   . Hyperlipidemia Mother   . Hypertension Father   . Diabetes Sister   . Heart disease Sister   . Hyperlipidemia Sister   . Hypertension Sister   . Cancer Sister   . Diabetes Brother   . Heart disease Brother   . Hypertension Brother   . Cancer Brother   . Hyperlipidemia Brother   . Heart disease Son   . Heart attack Son        amputation   Social History:  reports that she quit smoking about 5 weeks ago. Her smoking use included cigarettes. She smoked 0.50 packs per day. She has never used smokeless tobacco. She reports that she drank alcohol. She reports that she does not use drugs.  Allergies:  Allergies  Allergen Reactions  . Penicillins Swelling and Other (See Comments)    Has patient had a PCN reaction causing immediate rash, facial/tongue/throat swelling, SOB or lightheadedness with hypotension: Yes Has patient had a PCN reaction causing severe rash involving mucus membranes or skin necrosis: No Has patient had a  PCN reaction that required hospitalization: No Has patient had a PCN reaction occurring within the last 10 years: No If all of the above answers are "NO", then may proceed with Cephalosporin use.   . Shellfish Allergy Itching and Swelling  . Zoloft [Sertraline] Rash    Medications Prior to Admission  Medication Sig Dispense Refill  . amLODipine (NORVASC) 10 MG tablet Take 10 mg by mouth daily.    Marland Kitchen aspirin 81 MG tablet Take 81 mg by mouth daily.    Marland Kitchen atorvastatin (LIPITOR) 10 MG tablet Take 10 mg by mouth at bedtime.     Marland Kitchen buPROPion (WELLBUTRIN XL) 150 MG 24 hr tablet Take 150 mg by mouth daily.     . Calcium Carbonate-Vitamin D (CALCIUM 600+D HIGH POTENCY PO) Take 1 tablet by mouth daily.     . Cholecalciferol (VITAMIN D) 2000 UNITS tablet Take 2,000 Units by mouth daily.    Marland Kitchen escitalopram (LEXAPRO) 10 MG tablet 1  qam (Patient taking differently: Take 10 mg by mouth daily. ) 90 tablet 0  . ezetimibe (ZETIA) 10 MG tablet Take 10 mg by mouth daily.     . hydrochlorothiazide (HYDRODIURIL) 25 MG tablet Take 25 mg by mouth daily.     Marland Kitchen lisinopril (PRINIVIL,ZESTRIL) 40 MG tablet Take 40 mg by  mouth daily.    . metoprolol (LOPRESSOR) 100 MG tablet Take 100 mg by mouth 2 (two) times daily.    . nicotine (NICODERM CQ - DOSED IN MG/24 HOURS) 14 mg/24hr patch Place 14 mg onto the skin daily.      No results found for this or any previous visit (from the past 48 hour(s)). No results found.  Review of Systems  Musculoskeletal: Positive for joint pain.  All other systems reviewed and are negative.   Blood pressure (!) 151/75, pulse (!) 53, temperature 98.7 F (37.1 C), temperature source Oral, resp. rate 20, SpO2 96 %. Physical Exam  Constitutional: She is oriented to person, place, and time. She appears well-developed and well-nourished.  HENT:  Head: Normocephalic and atraumatic.  Eyes: Pupils are equal, round, and reactive to light.  Neck: Normal range of motion.  Cardiovascular:  Normal rate.  Respiratory: Effort normal.  GI: Soft.  Musculoskeletal:       Left shoulder: She exhibits decreased range of motion, tenderness, bony tenderness and decreased strength.  Neurological: She is alert and oriented to person, place, and time.  Skin: Skin is warm and dry.  Psychiatric: She has a normal mood and affect.     Assessment/Plan Left shoulder pain, weakness and full-thickness rotator cuff tear  To the OR today for a left shoulder arthroscopy with extensive debridement, subacromial decompression, and attempted rotator cuff repair depending on the nature of her fibers.  Risks and benefits have been discussed and informed consent is obtained.  Kathryne Hitchhristopher Y Marchetta Navratil, MD 01/27/2018, 11:21 AM

## 2018-01-27 NOTE — Transfer of Care (Signed)
Immediate Anesthesia Transfer of Care Note  Patient: [name]  Procedure(s) Performed: LEFT SHOULDER ARTHROSCOPY WITH DEBRIDEMENT, SUBACROMIAL DECOMPRESSION AND ROTATOR CUFF REPAIR (Left Shoulder)  Patient Location: PACU  Anesthesia Type:General  Level of Consciousness: awake, alert  and oriented  Airway & Oxygen Therapy: Patient Spontanous Breathing and Patient connected to face mask oxygen  Post-op Assessment: Report given to RN and Post -op Vital signs reviewed and stable  Post vital signs: Reviewed and stable  Last Vitals:  Vitals Value Taken Time  BP 127/80 01/27/2018  3:09 PM  Temp    Pulse 66 01/27/2018  3:12 PM  Resp 15 01/27/2018  3:12 PM  SpO2 83 % 01/27/2018  3:12 PM  Vitals shown include unvalidated device data.  Last Pain:  Vitals:   01/27/18 1022  TempSrc: Oral         Complications: No apparent anesthesia complications

## 2018-01-27 NOTE — Anesthesia Procedure Notes (Signed)
Anesthesia Procedure Image    

## 2018-01-28 DIAGNOSIS — S43422A Sprain of left rotator cuff capsule, initial encounter: Secondary | ICD-10-CM | POA: Diagnosis not present

## 2018-01-28 MED ORDER — HYDROCODONE-ACETAMINOPHEN 5-325 MG PO TABS: 1.0000 | ORAL_TABLET | ORAL | 0 refills | Status: DC | PRN

## 2018-01-28 NOTE — Plan of Care (Signed)
  Problem: Health Behavior/Discharge Planning: Goal: Ability to manage health-related needs will improve Outcome: Progressing   Problem: Clinical Measurements: Goal: Ability to maintain clinical measurements within normal limits will improve Outcome: Progressing Goal: Will remain free from infection Outcome: Progressing Goal: Diagnostic test results will improve Outcome: Progressing Goal: Respiratory complications will improve Outcome: Progressing Goal: Cardiovascular complication will be avoided Outcome: Progressing   Problem: Activity: Goal: Risk for activity intolerance will decrease Outcome: Progressing   Problem: Nutrition: Goal: Adequate nutrition will be maintained Outcome: Progressing   Problem: Coping: Goal: Level of anxiety will decrease Outcome: Progressing   Problem: Elimination: Goal: Will not experience complications related to bowel motility Outcome: Progressing Goal: Will not experience complications related to urinary retention Outcome: Progressing   Problem: Pain Managment: Goal: General experience of comfort will improve Outcome: Progressing   Problem: Safety: Goal: Ability to remain free from injury will improve Outcome: Progressing   Problem: Skin Integrity: Goal: Risk for impaired skin integrity will decrease Outcome: Progressing   Problem: Education: Goal: Knowledge of the prescribed therapeutic regimen will improve Outcome: Progressing Goal: Understanding of activity limitations/precautions following surgery will improve Outcome: Progressing Goal: Individualized Educational Video(s) Outcome: Progressing   Problem: Activity: Goal: Ability to tolerate increased activity will improve Outcome: Progressing   Problem: Pain Management: Goal: Pain level will decrease with appropriate interventions Outcome: Progressing   

## 2018-01-28 NOTE — Discharge Instructions (Signed)
You can shower daily and get your left shoulder wet including the incisions. Place new band-aids daily after each shower. Expect swelling - ice as needed several times daily. Please sleep wearing your sling. You can remove your sling to shower and several times daily to gently move your shoulder.

## 2018-01-28 NOTE — Progress Notes (Signed)
Patient ID: [name], female   DOB: 1940-07-25, 77 y.o.   MRN: [id number] Tolerated surgery well on her left shoulder.  Was able to repair her rotator cuff.  Dressings changed at the bedside.  Can be discharged to home this afternoon.

## 2018-01-28 NOTE — Discharge Summary (Signed)
Patient ID: [name] MRN: [id number] DOB/AGE: 1941/02/22 77 y.o.  Admit date: 01/27/2018 Discharge date: 01/28/2018  Admission Diagnoses:  Principal Problem:   Complete rotator cuff tear of left shoulder Active Problems:   S/P left rotator cuff repair   Discharge Diagnoses:  Same  Past Medical History:  Diagnosis Date  . Arthritis   . Depression   . High cholesterol   . Hypertension   . Pneumonia   . Stroke [facility])    1994 no residuals    Surgeries: Procedure(s): LEFT SHOULDER ARTHROSCOPY WITH DEBRIDEMENT, SUBACROMIAL DECOMPRESSION AND ROTATOR CUFF REPAIR on 01/27/2018   Consultants:   Discharged Condition: Improved  Hospital Course: [name] is an 77 y.o. female who was admitted 01/27/2018 for operative treatment ofComplete rotator cuff tear of left shoulder. Patient has severe unremitting pain that affects sleep, daily activities, and work/hobbies. After pre-op clearance the patient was taken to the operating room on 01/27/2018 and underwent  Procedure(s): LEFT SHOULDER ARTHROSCOPY WITH DEBRIDEMENT, SUBACROMIAL DECOMPRESSION AND ROTATOR CUFF REPAIR.    Patient was given perioperative antibiotics:  Anti-infectives (From admission, onward)   Start     Dose/Rate Route Frequency Ordered Stop   01/27/18 2000  clindamycin (CLEOCIN) IVPB 600 mg     600 mg 100 mL/hr over 30 Minutes Intravenous Every 6 hours 01/27/18 1755 01/28/18 0906   01/27/18 1015  clindamycin (CLEOCIN) IVPB 900 mg     900 mg 100 mL/hr over 30 Minutes Intravenous On call to O.R. 01/27/18 1011 01/27/18 1317       Patient was given sequential compression devices, early ambulation, and chemoprophylaxis to prevent DVT.  Patient benefited maximally from hospital stay and there were no complications.    Recent vital signs:  Patient Vitals for the past 24 hrs:  BP Temp Temp src Pulse Resp SpO2 Height Weight  01/28/18 1041 (!) 121/59 98.6 F (37 C) Oral (!) 55 16 95 % - -  01/28/18 0825 - - - - - 92 %  - -  01/28/18 0820 - - - - - (!) 85 % - -  01/28/18 0122 (!) 144/70 98.9 F (37.2 C) Oral 63 16 95 % - -  01/27/18 1752 (!) 151/72 (!) 97.5 F (36.4 C) Oral (!) 51 17 93 % 5\' 4"  (1.626 m) 93 kg  01/27/18 1730 (!) 147/74 98 F (36.7 C) - (!) 50 13 93 % - -  01/27/18 1715 (!) 153/78 - - (!) 51 16 94 % - -  01/27/18 1700 (!) 114/102 - - (!) 50 14 95 % - -  01/27/18 1645 (!) 150/68 - - (!) 58 17 91 % - -  01/27/18 1630 140/72 - - (!) 55 12 90 % - -  01/27/18 1615 139/73 - - (!) 50 17 98 % - -  01/27/18 1600 138/67 - - (!) 52 16 99 % - -  01/27/18 1545 136/72 - - (!) 52 15 100 % - -  01/27/18 1530 117/65 - - (!) 56 19 94 % - -  01/27/18 1515 122/72 - - 65 18 (!) 84 % - -  01/27/18 1509 127/80 98 F (36.7 C) - 66 16 (!) 88 % - -  01/27/18 1200 (!) 157/67 - - (!) 50 14 96 % - -  01/27/18 1158 - - - (!) 50 18 92 % - -  01/27/18 1157 139/70 - - (!) 49 19 91 % - -  01/27/18 1156 - - - (!) 50 18 91 % - -  01/27/18 1155 - - - (!) 50 19 (!) 89 % - -  01/27/18 1154 - - - (!) 51 20 90 % - -  01/27/18 1153 - - - (!) 52 (!) 21 92 % - -  01/27/18 1152 (!) 150/81 - - (!) 50 16 91 % - -  01/27/18 1151 - - - (!) 52 (!) 23 97 % - -  01/27/18 1150 - - - (!) 52 18 97 % - -  01/27/18 1149 - - - (!) 55 18 97 % - -  01/27/18 1148 - - - (!) 53 15 94 % - -  01/27/18 1147 128/84 - - (!) 58 18 99 % - -  01/27/18 1146 - - - (!) 51 16 91 % - -  01/27/18 1145 - - - (!) 50 19 91 % - -  01/27/18 1144 - - - (!) 52 17 94 % - -     Recent laboratory studies: No results for input(s): WBC, HGB, HCT, PLT, NA, K, CL, CO2, BUN, CREATININE, GLUCOSE, INR, CALCIUM in the last 72 hours.  Invalid input(s): PT, 2   Discharge Medications:   Allergies as of 01/28/2018      Reactions   Penicillins Swelling, Other (See Comments)   Has patient had a PCN reaction causing immediate rash, facial/tongue/throat swelling, SOB or lightheadedness with hypotension: Yes Has patient had a PCN reaction causing severe rash involving  mucus membranes or skin necrosis: No Has patient had a PCN reaction that required hospitalization: No Has patient had a PCN reaction occurring within the last 10 years: No If all of the above answers are "NO", then may proceed with Cephalosporin use.   Shellfish Allergy Itching, Swelling   Zoloft [sertraline] Rash      Medication List    TAKE these medications   amLODipine 10 MG tablet Commonly known as:  NORVASC Take 10 mg by mouth daily.   aspirin 81 MG tablet Take 81 mg by mouth daily.   atorvastatin 10 MG tablet Commonly known as:  LIPITOR Take 10 mg by mouth at bedtime.   buPROPion 150 MG 24 hr tablet Commonly known as:  WELLBUTRIN XL Take 150 mg by mouth daily.   CALCIUM 600+D HIGH POTENCY PO Take 1 tablet by mouth daily.   escitalopram 10 MG tablet Commonly known as:  LEXAPRO 1  qam What changed:    how much to take  how to take this  when to take this  additional instructions   ezetimibe 10 MG tablet Commonly known as:  ZETIA Take 10 mg by mouth daily.   hydrochlorothiazide 25 MG tablet Commonly known as:  HYDRODIURIL Take 25 mg by mouth daily.   HYDROcodone-acetaminophen 5-325 MG tablet Commonly known as:  NORCO/VICODIN Take 1-2 tablets by mouth every 4 (four) hours as needed for moderate pain (pain score 4-6).   lisinopril 40 MG tablet Commonly known as:  PRINIVIL,ZESTRIL Take 40 mg by mouth daily.   metoprolol tartrate 100 MG tablet Commonly known as:  LOPRESSOR Take 100 mg by mouth 2 (two) times daily.   nicotine 14 mg/24hr patch Commonly known as:  NICODERM CQ - dosed in mg/24 hours Place 14 mg onto the skin daily.   Vitamin D 50 MCG (2000 UT) tablet Take 2,000 Units by mouth daily.       Diagnostic Studies: No results found.  Disposition: Discharge disposition: 01-Home or Self Care         Follow-up Information  Kathryne Hitch, MD Follow up in 1 week(s).   Specialty:  Orthopedic Surgery Contact  information: [address] [place] [address] [phone]            Signed: Kathryne Hitch 01/28/2018, 10:59 AM

## 2018-01-29 NOTE — Anesthesia Postprocedure Evaluation (Signed)
Anesthesia Post Note  Patient: Micah Noelva E Duck  Procedure(s) Performed: LEFT SHOULDER ARTHROSCOPY WITH DEBRIDEMENT, SUBACROMIAL DECOMPRESSION AND ROTATOR CUFF REPAIR (Left Shoulder)     Patient location during evaluation: PACU Anesthesia Type: General Level of consciousness: awake and alert Pain management: pain level controlled Vital Signs Assessment: post-procedure vital signs reviewed and stable Respiratory status: spontaneous breathing, nonlabored ventilation, respiratory function stable and patient connected to nasal cannula oxygen Cardiovascular status: blood pressure returned to baseline and stable Postop Assessment: no apparent nausea or vomiting Anesthetic complications: no    Last Vitals:  Vitals:   01/28/18 1041 01/28/18 1200  BP: (!) 121/59 121/60  Pulse: (!) 55 60  Resp: 16   Temp: 37 C   SpO2: 95%     Last Pain:  Vitals:   01/28/18 1041  TempSrc: Oral  PainSc:                  Brecklynn Jian S

## 2018-01-30 ENCOUNTER — Encounter (HOSPITAL_COMMUNITY): Payer: Self-pay | Admitting: Orthopaedic Surgery

## 2018-02-13 ENCOUNTER — Encounter (INDEPENDENT_AMBULATORY_CARE_PROVIDER_SITE_OTHER): Payer: Self-pay | Admitting: Orthopaedic Surgery

## 2018-02-13 ENCOUNTER — Ambulatory Visit (INDEPENDENT_AMBULATORY_CARE_PROVIDER_SITE_OTHER): Payer: Medicare Other | Admitting: Orthopaedic Surgery

## 2018-02-13 DIAGNOSIS — Z9889 Other specified postprocedural states: Secondary | ICD-10-CM

## 2018-02-13 NOTE — Progress Notes (Signed)
The patient is a very pleasant 77 year old female who is in her first postoperative visit status post a left shoulder arthroscopy with a rotator cuff repair.  Surprisingly we were able to bring her rotator cuff back over the resting position at the greater tuberosity in place anchoring the bone that held and sutures to the cuff that helped.  She is someone who we want to go very slow on given the fact that she is 77 years old and does have decreased integrity of the collagen fibers of the shoulder.  She seems to be doing well overall.  I remove the sutures from the 3 incisions of her left shoulder everything looks good.  Her axillary nerve is working.  I did go over her arthroscopy pictures with her and showed her a shoulder model explained what we did with surgery.  At this point she will come in and out of the sling as comfort allows.  I do not want to put her through any extensive therapy as of yet given her age and how tenuous the repair was.  She will come in and out of the sling to work on range of motion the shoulder but no heavy activities and reaching behind her with that arm and no heavy lifting.  We will see her back in 4 weeks to see how she is doing overall.  All question concerns were answered and addressed.

## 2018-03-07 DIAGNOSIS — R69 Illness, unspecified: Secondary | ICD-10-CM | POA: Diagnosis not present

## 2018-03-13 ENCOUNTER — Ambulatory Visit (INDEPENDENT_AMBULATORY_CARE_PROVIDER_SITE_OTHER): Payer: Medicare Other | Admitting: Orthopaedic Surgery

## 2018-03-20 ENCOUNTER — Encounter (INDEPENDENT_AMBULATORY_CARE_PROVIDER_SITE_OTHER): Payer: Self-pay | Admitting: Orthopaedic Surgery

## 2018-03-20 ENCOUNTER — Ambulatory Visit (INDEPENDENT_AMBULATORY_CARE_PROVIDER_SITE_OTHER): Payer: Medicare HMO | Admitting: Orthopaedic Surgery

## 2018-03-20 DIAGNOSIS — Z9889 Other specified postprocedural states: Secondary | ICD-10-CM

## 2018-03-20 NOTE — Progress Notes (Signed)
The patient is a 78 year old who is following up 52 days status post a left shoulder arthroscopy with subacromial decompression and extensive debridement.  We did attempt to repair the rotator cuff and were able to get some sutures to the cuff and anchoring the bone.  She still is painful as she was she states before surgery.  She is not interested in any type of therapy or injection.  On exam her shoulder is stiff and painful but I can put her through range of motion.  I did offer steroid injection but she declines today and states she can just try her pain medication which she does take only sparingly.  She also talks about a heating pad and letting hot water in the shower run over her shoulder.  All questions concerns were answered and addressed we will see her back in 4 weeks.  If she still having a significant amount of issues with the shoulder I would recommend physical therapy and injection.

## 2018-04-17 ENCOUNTER — Encounter (INDEPENDENT_AMBULATORY_CARE_PROVIDER_SITE_OTHER): Payer: Self-pay | Admitting: Orthopaedic Surgery

## 2018-04-17 ENCOUNTER — Ambulatory Visit (INDEPENDENT_AMBULATORY_CARE_PROVIDER_SITE_OTHER): Payer: Medicare HMO | Admitting: Orthopaedic Surgery

## 2018-04-17 DIAGNOSIS — Z9889 Other specified postprocedural states: Secondary | ICD-10-CM

## 2018-04-17 NOTE — Progress Notes (Signed)
The patient is a 78 year old female who is 80 days status post a left shoulder arthroscopy and at least a repair of the rotator cuff tear and which is quite tenuous repair which we were able to get this repaired back down to bone.  She is been just taking it easy and using her shoulder as comfort allows.  She states that she is doing well.  She feels like her strength and range of motion are improving and she has no issues at all.  On exam I can move her shoulder on easily.  There is still residual weakness of the cuff itself and decreased motion of the shoulder but overall she tolerates moving her shoulder and she reports almost no discomfort.  At this point she will follow-up as needed since she is doing well.  We can always inject her shoulder at some point if she needs it.  All question concerns were answered and addressed.  If there is any issues at all she knows to come back and see [place] at any time.

## 2018-04-26 ENCOUNTER — Other Ambulatory Visit: Payer: Self-pay | Admitting: Internal Medicine

## 2018-04-26 DIAGNOSIS — Z1231 Encounter for screening mammogram for malignant neoplasm of breast: Secondary | ICD-10-CM

## 2018-05-01 DIAGNOSIS — H2513 Age-related nuclear cataract, bilateral: Secondary | ICD-10-CM | POA: Diagnosis not present

## 2018-05-01 DIAGNOSIS — H40013 Open angle with borderline findings, low risk, bilateral: Secondary | ICD-10-CM | POA: Diagnosis not present

## 2018-05-01 DIAGNOSIS — H35033 Hypertensive retinopathy, bilateral: Secondary | ICD-10-CM | POA: Diagnosis not present

## 2018-05-01 DIAGNOSIS — H25013 Cortical age-related cataract, bilateral: Secondary | ICD-10-CM | POA: Diagnosis not present

## 2018-05-26 ENCOUNTER — Ambulatory Visit: Payer: Medicare Other

## 2018-06-12 DIAGNOSIS — J181 Lobar pneumonia, unspecified organism: Secondary | ICD-10-CM | POA: Diagnosis not present

## 2018-06-12 DIAGNOSIS — R05 Cough: Secondary | ICD-10-CM | POA: Diagnosis not present

## 2018-06-12 DIAGNOSIS — Z20818 Contact with and (suspected) exposure to other bacterial communicable diseases: Secondary | ICD-10-CM | POA: Diagnosis not present

## 2018-06-12 DIAGNOSIS — R0781 Pleurodynia: Secondary | ICD-10-CM | POA: Diagnosis not present

## 2018-06-12 DIAGNOSIS — I10 Essential (primary) hypertension: Secondary | ICD-10-CM | POA: Diagnosis not present

## 2018-06-13 ENCOUNTER — Ambulatory Visit (INDEPENDENT_AMBULATORY_CARE_PROVIDER_SITE_OTHER): Payer: Medicare HMO | Admitting: Orthopaedic Surgery

## 2018-07-07 DIAGNOSIS — R7301 Impaired fasting glucose: Secondary | ICD-10-CM | POA: Diagnosis not present

## 2018-07-07 DIAGNOSIS — M859 Disorder of bone density and structure, unspecified: Secondary | ICD-10-CM | POA: Diagnosis not present

## 2018-07-07 DIAGNOSIS — E7849 Other hyperlipidemia: Secondary | ICD-10-CM | POA: Diagnosis not present

## 2018-07-07 DIAGNOSIS — I10 Essential (primary) hypertension: Secondary | ICD-10-CM | POA: Diagnosis not present

## 2018-07-12 ENCOUNTER — Ambulatory Visit: Payer: Medicare Other

## 2018-07-26 ENCOUNTER — Other Ambulatory Visit: Payer: Self-pay

## 2018-07-26 ENCOUNTER — Ambulatory Visit
Admission: RE | Admit: 2018-07-26 | Discharge: 2018-07-26 | Disposition: A | Discharge status: Home / Self Care | Payer: Medicare HMO | Source: Ambulatory Visit | Attending: Internal Medicine | Admitting: Internal Medicine

## 2018-07-26 DIAGNOSIS — I739 Peripheral vascular disease, unspecified: Secondary | ICD-10-CM | POA: Diagnosis not present

## 2018-07-26 DIAGNOSIS — Z1231 Encounter for screening mammogram for malignant neoplasm of breast: Secondary | ICD-10-CM | POA: Diagnosis not present

## 2018-07-26 DIAGNOSIS — Z0000 Encounter for general adult medical examination without abnormal findings: Secondary | ICD-10-CM | POA: Diagnosis not present

## 2018-07-26 DIAGNOSIS — Z1339 Encounter for screening examination for other mental health and behavioral disorders: Secondary | ICD-10-CM | POA: Diagnosis not present

## 2018-07-26 DIAGNOSIS — R69 Illness, unspecified: Secondary | ICD-10-CM | POA: Diagnosis not present

## 2018-07-26 DIAGNOSIS — E785 Hyperlipidemia, unspecified: Secondary | ICD-10-CM | POA: Diagnosis not present

## 2018-07-26 DIAGNOSIS — I10 Essential (primary) hypertension: Secondary | ICD-10-CM | POA: Diagnosis not present

## 2018-07-26 DIAGNOSIS — R7301 Impaired fasting glucose: Secondary | ICD-10-CM | POA: Diagnosis not present

## 2018-07-26 DIAGNOSIS — N3946 Mixed incontinence: Secondary | ICD-10-CM | POA: Diagnosis not present

## 2018-07-26 DIAGNOSIS — I69951 Hemiplegia and hemiparesis following unspecified cerebrovascular disease affecting right dominant side: Secondary | ICD-10-CM | POA: Diagnosis not present

## 2018-07-26 DIAGNOSIS — Z1331 Encounter for screening for depression: Secondary | ICD-10-CM | POA: Diagnosis not present

## 2018-07-26 DIAGNOSIS — M8580 Other specified disorders of bone density and structure, unspecified site: Secondary | ICD-10-CM | POA: Diagnosis not present

## 2018-08-09 DIAGNOSIS — H40013 Open angle with borderline findings, low risk, bilateral: Secondary | ICD-10-CM | POA: Diagnosis not present

## 2018-10-21 ENCOUNTER — Emergency Department (HOSPITAL_COMMUNITY)
Admission: EM | Admit: 2018-10-21 | Discharge: 2018-10-21 | Disposition: A | Discharge status: Home / Self Care | Payer: Medicare HMO | Attending: Emergency Medicine | Admitting: Emergency Medicine

## 2018-10-21 ENCOUNTER — Other Ambulatory Visit: Payer: Self-pay

## 2018-10-21 ENCOUNTER — Emergency Department (HOSPITAL_COMMUNITY): Payer: Medicare HMO

## 2018-10-21 DIAGNOSIS — I10 Essential (primary) hypertension: Secondary | ICD-10-CM | POA: Diagnosis not present

## 2018-10-21 DIAGNOSIS — M542 Cervicalgia: Secondary | ICD-10-CM | POA: Diagnosis not present

## 2018-10-21 DIAGNOSIS — Z7982 Long term (current) use of aspirin: Secondary | ICD-10-CM | POA: Insufficient documentation

## 2018-10-21 DIAGNOSIS — M546 Pain in thoracic spine: Secondary | ICD-10-CM | POA: Diagnosis not present

## 2018-10-21 DIAGNOSIS — S199XXA Unspecified injury of neck, initial encounter: Secondary | ICD-10-CM | POA: Diagnosis not present

## 2018-10-21 DIAGNOSIS — S0990XA Unspecified injury of head, initial encounter: Secondary | ICD-10-CM | POA: Diagnosis not present

## 2018-10-21 DIAGNOSIS — S299XXA Unspecified injury of thorax, initial encounter: Secondary | ICD-10-CM | POA: Diagnosis not present

## 2018-10-21 DIAGNOSIS — R42 Dizziness and giddiness: Secondary | ICD-10-CM | POA: Diagnosis not present

## 2018-10-21 DIAGNOSIS — M25562 Pain in left knee: Secondary | ICD-10-CM | POA: Diagnosis not present

## 2018-10-21 DIAGNOSIS — W19XXXA Unspecified fall, initial encounter: Secondary | ICD-10-CM

## 2018-10-21 DIAGNOSIS — M25522 Pain in left elbow: Secondary | ICD-10-CM | POA: Diagnosis not present

## 2018-10-21 DIAGNOSIS — R51 Headache: Secondary | ICD-10-CM | POA: Diagnosis not present

## 2018-10-21 DIAGNOSIS — S8992XA Unspecified injury of left lower leg, initial encounter: Secondary | ICD-10-CM | POA: Diagnosis not present

## 2018-10-21 DIAGNOSIS — Z87891 Personal history of nicotine dependence: Secondary | ICD-10-CM | POA: Diagnosis not present

## 2018-10-21 DIAGNOSIS — Z79899 Other long term (current) drug therapy: Secondary | ICD-10-CM | POA: Diagnosis not present

## 2018-10-21 DIAGNOSIS — S0993XA Unspecified injury of face, initial encounter: Secondary | ICD-10-CM | POA: Diagnosis not present

## 2018-10-21 DIAGNOSIS — M25569 Pain in unspecified knee: Secondary | ICD-10-CM | POA: Diagnosis present

## 2018-10-21 LAB — CBC WITH DIFFERENTIAL/PLATELET
Abs Immature Granulocytes: 0.01 10*3/uL (ref 0.00–0.07)
Basophils Absolute: 0 10*3/uL (ref 0.0–0.1)
Basophils Relative: 1 %
Eosinophils Absolute: 0.1 10*3/uL (ref 0.0–0.5)
Eosinophils Relative: 2 %
HCT: 47.5 % — ABNORMAL HIGH (ref 36.0–46.0)
Hemoglobin: 14.1 g/dL (ref 12.0–15.0)
Immature Granulocytes: 0 %
Lymphocytes Relative: 33 %
Lymphs Abs: 1.5 10*3/uL (ref 0.7–4.0)
MCH: 25.8 pg — ABNORMAL LOW (ref 26.0–34.0)
MCHC: 29.7 g/dL — ABNORMAL LOW (ref 30.0–36.0)
MCV: 87 fL (ref 80.0–100.0)
Monocytes Absolute: 0.5 10*3/uL (ref 0.1–1.0)
Monocytes Relative: 11 %
Neutro Abs: 2.5 10*3/uL (ref 1.7–7.7)
Neutrophils Relative %: 53 %
Platelets: 169 10*3/uL (ref 150–400)
RBC: 5.46 MIL/uL — ABNORMAL HIGH (ref 3.87–5.11)
RDW: 14.3 % (ref 11.5–15.5)
WBC: 4.6 10*3/uL (ref 4.0–10.5)
nRBC: 0 % (ref 0.0–0.2)

## 2018-10-21 LAB — URINALYSIS, ROUTINE W REFLEX MICROSCOPIC
Bilirubin Urine: NEGATIVE
Glucose, UA: 50 mg/dL — AB
Hgb urine dipstick: NEGATIVE
Ketones, ur: NEGATIVE mg/dL
Nitrite: NEGATIVE
Protein, ur: NEGATIVE mg/dL
Specific Gravity, Urine: 1.006 (ref 1.005–1.030)
pH: 8 (ref 5.0–8.0)

## 2018-10-21 LAB — COMPREHENSIVE METABOLIC PANEL
ALT: 6 U/L (ref 0–44)
AST: 15 U/L (ref 15–41)
Albumin: 3.5 g/dL (ref 3.5–5.0)
Alkaline Phosphatase: 49 U/L (ref 38–126)
Anion gap: 9 (ref 5–15)
BUN: 6 mg/dL — ABNORMAL LOW (ref 8–23)
CO2: 28 mmol/L (ref 22–32)
Calcium: 9.1 mg/dL (ref 8.9–10.3)
Chloride: 104 mmol/L (ref 98–111)
Creatinine, Ser: 0.87 mg/dL (ref 0.44–1.00)
GFR calc Af Amer: >60 mL/min (ref >60)
GFR calc non Af Amer: >60 mL/min (ref >60)
Glucose, Bld: 86 mg/dL (ref 70–99)
Potassium: 3.5 mmol/L (ref 3.5–5.1)
Sodium: 141 mmol/L (ref 135–145)
Total Bilirubin: 1 mg/dL (ref 0.3–1.2)
Total Protein: 6.3 g/dL — ABNORMAL LOW (ref 6.5–8.1)

## 2018-10-21 LAB — CK: Total CK: 79 U/L (ref 38–234)

## 2018-10-21 NOTE — ED Triage Notes (Addendum)
Patient has fell the past three days. The son is not quite sure why but her sister requested that she be brought to the ER. Patient has dementia.

## 2018-10-21 NOTE — ED Notes (Signed)
Pt ambulated to restroom with assistance to collect urine sample.

## 2018-10-21 NOTE — Discharge Instructions (Addendum)
Home health will contact you to set up a home visit. Schedule an appointment with your primary care provider early next week to follow-up on your visit today. Return to the emergency department for any new or worsening symptoms.

## 2018-10-21 NOTE — ED Provider Notes (Signed)
[facility]   CSN: [id number] Arrival date & time: 10/21/18  1314     History   Chief Complaint Chief Complaint  Patient presents with   Fall    HPI [name] is a 78 y.o. female.     Patient is a 78 year old female who presents after falls.  History is obtained from both the son and the patient although they are both very poor historians.  It seems that the patient has had at least 3 falls over the last week.  The patient states that every fall has related to some mechanical malfunction.  During one episode she tripped on a brick outside and she says that she laid in the bushes for about 6 hours.  She states another fall occurred when she had sore knees from praying on a hard stool and when she stood up she fell.  Her son states that she has been more off balance than normal although she has had trouble with her balance for several months.  He also states that she has had some increased confusion over the last several months.  She does live at home by herself.  She denies any recent illnesses.  No fevers.  No cough or colds.  No chest pain or shortness of breath.  No vomiting or diarrhea.  No urinary symptoms.  She has hit her head during at least 1 of the falls.  She has some pain to the back of her head with a small knot.  She also has some soreness in all of her extremities from the falls but mostly on her left elbow and left knee.  She does say that sometimes she gets dizzy.  She reports some chronic weakness of her left side from a prior stroke in the 1990s but it does not sound like it is any different today than it typically is.     Past Medical History:  Diagnosis Date   Arthritis    Depression    High cholesterol    Hypertension    Pneumonia    Stroke [facility](HCC)    1994 no residuals    Patient Active Problem List   Diagnosis Date Noted   Status post left rotator cuff repair 02/13/2018   Complete rotator cuff  tear of left shoulder 01/27/2018   S/P left rotator cuff repair 01/27/2018   PVD (peripheral vascular disease) with claudication (HCC) 05/24/2012   Peripheral vascular disease, unspecified (HCC) 11/24/2011   HYPERCHOLESTEROLEMIA 04/21/2006   OBESITY, NOS 04/21/2006   DEPRESSION, MAJOR, RECURRENT 04/21/2006   TOBACCO DEPENDENCE 04/21/2006   HYPERTENSION, BENIGN SYSTEMIC 04/21/2006   HAND PAIN 04/21/2006    Past Surgical History:  Procedure Laterality Date   ABDOMINAL HYSTERECTOMY     catheterization of the left superficial femoral artery  10/20/2009   SHOULDER ARTHROSCOPY WITH ROTATOR CUFF REPAIR AND SUBACROMIAL DECOMPRESSION Left 01/27/2018   Procedure: LEFT SHOULDER ARTHROSCOPY WITH DEBRIDEMENT, SUBACROMIAL DECOMPRESSION AND ROTATOR CUFF REPAIR;  Surgeon: Kathryne HitchBlackman, Christopher Y, MD;  Location: [facility];  Service: Orthopedics;  Laterality: Left;   SHOULDER SURGERY     Left with dr. Gayla DossBalckman arthroscopy with possible rotator cuff repair   TONSILLECTOMY       OB History   No obstetric history on file.      Home Medications    Prior to Admission medications   Medication Sig Start Date End Date Taking? Authorizing Provider  aspirin 81 MG tablet Take 81 mg by mouth daily.  Yes [provider]  lisinopril (PRINIVIL,ZESTRIL) 40 MG tablet Take 40 mg by mouth daily.   Yes [provider]  Calcium Carbonate-Vitamin D (CALCIUM 600+D HIGH POTENCY PO) Take 1 tablet by mouth daily.     [provider]  Cholecalciferol (VITAMIN D) 2000 UNITS tablet Take 2,000 Units by mouth daily.    [provider]  escitalopram (LEXAPRO) 10 MG tablet 1  qam Patient not taking: Reported on 10/21/2018 04/23/16   Archer AsaPlovsky, Gerald, MD  HYDROcodone-acetaminophen (NORCO/VICODIN) 5-325 MG tablet Take 1-2 tablets by mouth every 4 (four) hours as needed for moderate pain (pain score 4-6). Patient not taking: Reported on 10/21/2018 01/28/18   Kathryne HitchBlackman, Christopher Y, MD     Family History Family History  Problem Relation Age of Onset   Cancer Mother    Diabetes Mother    Heart disease Mother    Hypertension Mother    Heart attack Mother    Hyperlipidemia Mother    Hypertension Father    Diabetes Sister    Heart disease Sister    Hyperlipidemia Sister    Hypertension Sister    Cancer Sister    Diabetes Brother    Heart disease Brother    Hypertension Brother    Cancer Brother    Hyperlipidemia Brother    Heart disease Son    Heart attack Son        amputation    Social History Social History   Tobacco Use   Smoking status: Former Smoker    Packs/day: 0.50    Types: Cigarettes    Quit date: 12/17/2017    Years since quitting: 0.8   Smokeless tobacco: Never Used   Tobacco comment: using nic patches  Substance Use Topics   Alcohol use: Not Currently   Drug use: No     Allergies   Penicillins, Shellfish allergy, and Zoloft [sertraline]   Review of Systems Review of Systems  Constitutional: Positive for fatigue. Negative for chills, diaphoresis and fever.  HENT: Negative for congestion, rhinorrhea and sneezing.   Eyes: Negative.   Respiratory: Negative for cough, chest tightness and shortness of breath.   Cardiovascular: Negative for chest pain and leg swelling.  Gastrointestinal: Negative for abdominal pain, blood in stool, diarrhea, nausea and vomiting.  Genitourinary: Negative for difficulty urinating, flank pain, frequency and hematuria.  Musculoskeletal: Positive for arthralgias and back pain.  Skin: Negative for rash.  Neurological: Positive for dizziness and weakness (Chronic left leg weakness). Negative for speech difficulty, numbness and headaches.     Physical Exam Updated Vital Signs BP (!) 164/86    Pulse 66    Temp 98.5 F (36.9 C) (Oral)    Resp 11    SpO2 97%   Physical Exam Constitutional:      Appearance: She is well-developed.  HENT:     Head: Normocephalic and atraumatic.   Eyes:     Pupils: Pupils are equal, round, and reactive to light.     Comments: Patient has some minor swelling above the left eye in the superior orbital rim.  There appears to be some proptosis of the left eye.  She has good range of motion of the eye although I cannot get her to do full lateral movement of the left eye.  Neck:     Comments: No pain along the cervical spine.  There is some tenderness to the lower thoracic spine.  No pain to the lumbosacral spine.  No step-offs or deformities Cardiovascular:  Rate and Rhythm: Normal rate and regular rhythm.     Heart sounds: Normal heart sounds.  Pulmonary:     Effort: Pulmonary effort is normal. No respiratory distress.     Breath sounds: Normal breath sounds. No wheezing or rales.  Chest:     Chest wall: No tenderness.  Abdominal:     General: Bowel sounds are normal.     Palpations: Abdomen is soft.     Tenderness: There is no abdominal tenderness. There is no guarding or rebound.  Musculoskeletal: Normal range of motion.     Comments: There is some minor tenderness on range of motion of the left elbow and the left knee.  No deformity or effusions are noted.  There is no other pain on palpation or range of motion of the extremities.  She has some chronic appearing swelling to the lower extremities bilaterally  Lymphadenopathy:     Cervical: No cervical adenopathy.  Skin:    General: Skin is warm and dry.     Findings: No rash.  Neurological:     Mental Status: She is alert and oriented to person, place, and time.     Comments: Patient is oriented to person place and month/year.  She has some slight weakness of her left leg as compared to the right which she says is baseline for her.  She has limited range of motion of her left upper extremity due to chronic shoulder injury.  She has normal sensation to light touch in all extremities.  No facial drooping or other obvious cranial nerve deficit.      ED Treatments / Results   Labs (all labs ordered are listed, but only abnormal results are displayed) Labs Reviewed  COMPREHENSIVE METABOLIC PANEL - Abnormal; Notable for the following components:      Result Value   BUN 6 (*)    Total Protein 6.3 (*)    All other components within normal limits  CBC WITH DIFFERENTIAL/PLATELET - Abnormal; Notable for the following components:   RBC 5.46 (*)    HCT 47.5 (*)    MCH 25.8 (*)    MCHC 29.7 (*)    All other components within normal limits  CK  URINALYSIS, ROUTINE W REFLEX MICROSCOPIC    EKG EKG Interpretation  Date/Time:  Saturday October 21 2018 13:29:01 EDT Ventricular Rate:  62 PR Interval:    QRS Duration: 87 QT Interval:  409 QTC Calculation: 416 R Axis:   50 Text Interpretation:  Sinus rhythm Consider left atrial enlargement Probable LVH with secondary repol abnrm since last tracing no significant change Confirmed by Rolan Bucco [phone]) on 10/21/2018 2:16:33 PM   Radiology Dg Thoracic Spine 2 View  Result Date: 10/21/2018 CLINICAL DATA:  Fall with back pain. EXAM: THORACIC SPINE 2 VIEWS COMPARISON:  None. FINDINGS: There is no evidence of thoracic spine fracture. Alignment is normal. No other significant bone abnormalities are identified. IMPRESSION: Negative. Electronically Signed   By: Katherine Mantle M.D.   On: 10/21/2018 16:09   Dg Elbow Complete Left  Result Date: 10/21/2018 CLINICAL DATA:  Left elbow pain. EXAM: LEFT ELBOW - COMPLETE 3+ VIEW COMPARISON:  None. FINDINGS: There is no evidence of fracture, dislocation, or joint effusion. There is no evidence of arthropathy or other focal bone abnormality. Soft tissues are unremarkable. IMPRESSION: Negative. Electronically Signed   By: Katherine Mantle M.D.   On: 10/21/2018 16:11   Ct Head Wo Contrast  Result Date: 10/21/2018 CLINICAL DATA:  Acute pain due  to trauma EXAM: CT HEAD WITHOUT CONTRAST CT ORBIT WITHOUT CONTRAST CT CERVICAL SPINE WITHOUT CONTRAST TECHNIQUE: Multidetector CT  imaging of the head, cervical spine, and maxillofacial structures were performed using the standard protocol without intravenous contrast. Multiplanar CT image reconstructions of the cervical spine and orbit structures were also generated. COMPARISON:  12/09/2015. FINDINGS: CT HEAD FINDINGS Brain: No evidence of acute infarction, hemorrhage, hydrocephalus, extra-axial collection or mass lesion/mass effect. There is an old right occipital lobe infarct. There are chronic microvascular ischemic changes and multiple old lacunar infarcts. Vascular: No hyperdense vessel or unexpected calcification. Skull: Normal. Negative for fracture or focal lesion. Other: None. CT ORBIT FINDINGS Osseous: There is a chronic appearing fracture of the right lamina papyracea. There is no acute displaced fracture. Orbits: Negative. No traumatic or inflammatory finding. Sinuses: There is mucosal retention cyst in the right maxillary sinus. The remaining paranasal sinuses and mastoid air cells are essentially clear. Soft tissues: Negative. CT CERVICAL SPINE FINDINGS Alignment: There is reversal of the normal cervical lordotic curvature. Skull base and vertebrae: No acute fracture. No primary bone lesion or focal pathologic process. Soft tissues and spinal canal: No prevertebral fluid or swelling. No visible canal hematoma. Disc levels: There is disc height loss throughout the cervical spine, greatest at the C5-C6 level. Upper chest: Negative. Other: None IMPRESSION: 1. No acute intracranial abnormality. 2. No acute cervical spine fracture. 3. No acute abnormality detected involving the orbits. 4. Chronic ischemic changes are noted as detailed above, similar to prior study in 2017. Electronically Signed   By: Katherine Mantle M.D.   On: 10/21/2018 15:53   Ct Cervical Spine Wo Contrast  Result Date: 10/21/2018 CLINICAL DATA:  Acute pain due to trauma EXAM: CT HEAD WITHOUT CONTRAST CT ORBIT WITHOUT CONTRAST CT CERVICAL SPINE WITHOUT  CONTRAST TECHNIQUE: Multidetector CT imaging of the head, cervical spine, and maxillofacial structures were performed using the standard protocol without intravenous contrast. Multiplanar CT image reconstructions of the cervical spine and orbit structures were also generated. COMPARISON:  12/09/2015. FINDINGS: CT HEAD FINDINGS Brain: No evidence of acute infarction, hemorrhage, hydrocephalus, extra-axial collection or mass lesion/mass effect. There is an old right occipital lobe infarct. There are chronic microvascular ischemic changes and multiple old lacunar infarcts. Vascular: No hyperdense vessel or unexpected calcification. Skull: Normal. Negative for fracture or focal lesion. Other: None. CT ORBIT FINDINGS Osseous: There is a chronic appearing fracture of the right lamina papyracea. There is no acute displaced fracture. Orbits: Negative. No traumatic or inflammatory finding. Sinuses: There is mucosal retention cyst in the right maxillary sinus. The remaining paranasal sinuses and mastoid air cells are essentially clear. Soft tissues: Negative. CT CERVICAL SPINE FINDINGS Alignment: There is reversal of the normal cervical lordotic curvature. Skull base and vertebrae: No acute fracture. No primary bone lesion or focal pathologic process. Soft tissues and spinal canal: No prevertebral fluid or swelling. No visible canal hematoma. Disc levels: There is disc height loss throughout the cervical spine, greatest at the C5-C6 level. Upper chest: Negative. Other: None IMPRESSION: 1. No acute intracranial abnormality. 2. No acute cervical spine fracture. 3. No acute abnormality detected involving the orbits. 4. Chronic ischemic changes are noted as detailed above, similar to prior study in 2017. Electronically Signed   By: Katherine Mantle M.D.   On: 10/21/2018 15:53   Dg Knee Complete 4 Views Left  Result Date: 10/21/2018 CLINICAL DATA:  Pain status post fall EXAM: LEFT KNEE - COMPLETE 4+ VIEW COMPARISON:  None.  FINDINGS: There is  no acute displaced fracture. No dislocation. Multicompartmental degenerative changes are noted in the knee, greatest in the medial and patellofemoral compartments. Vascular stents are noted. Advanced vascular calcifications are noted. There appears to be a trace suprapatellar joint effusion. IMPRESSION: No acute osseous abnormality. Electronically Signed   By: Constance Holster M.D.   On: 10/21/2018 16:11   Ct Orbits Wo Contrast  Result Date: 10/21/2018 CLINICAL DATA:  Acute pain due to trauma EXAM: CT HEAD WITHOUT CONTRAST CT ORBIT WITHOUT CONTRAST CT CERVICAL SPINE WITHOUT CONTRAST TECHNIQUE: Multidetector CT imaging of the head, cervical spine, and maxillofacial structures were performed using the standard protocol without intravenous contrast. Multiplanar CT image reconstructions of the cervical spine and orbit structures were also generated. COMPARISON:  12/09/2015. FINDINGS: CT HEAD FINDINGS Brain: No evidence of acute infarction, hemorrhage, hydrocephalus, extra-axial collection or mass lesion/mass effect. There is an old right occipital lobe infarct. There are chronic microvascular ischemic changes and multiple old lacunar infarcts. Vascular: No hyperdense vessel or unexpected calcification. Skull: Normal. Negative for fracture or focal lesion. Other: None. CT ORBIT FINDINGS Osseous: There is a chronic appearing fracture of the right lamina papyracea. There is no acute displaced fracture. Orbits: Negative. No traumatic or inflammatory finding. Sinuses: There is mucosal retention cyst in the right maxillary sinus. The remaining paranasal sinuses and mastoid air cells are essentially clear. Soft tissues: Negative. CT CERVICAL SPINE FINDINGS Alignment: There is reversal of the normal cervical lordotic curvature. Skull base and vertebrae: No acute fracture. No primary bone lesion or focal pathologic process. Soft tissues and spinal canal: No prevertebral fluid or swelling. No visible  canal hematoma. Disc levels: There is disc height loss throughout the cervical spine, greatest at the C5-C6 level. Upper chest: Negative. Other: None IMPRESSION: 1. No acute intracranial abnormality. 2. No acute cervical spine fracture. 3. No acute abnormality detected involving the orbits. 4. Chronic ischemic changes are noted as detailed above, similar to prior study in 2017. Electronically Signed   By: Constance Holster M.D.   On: 10/21/2018 15:53    Procedures Procedures (including critical care time)  Medications Ordered in ED Medications - No data to display   Initial Impression / Assessment and Plan / ED Course  I have reviewed the triage vital signs and the nursing notes.  Pertinent labs & imaging results that were available during my care of the patient were reviewed by me and considered in my medical decision making (see chart for details).        Patient is a 78 year old female who presents with dizziness and resulting falls.  She does seem to have some worsening confusion over the last few months.  Her labs are non-concerning.  Her head CT is negative.  Her urine is pending.  She will need to ambulate following this and disposition will be determined based on that.  [place], PA-C to follow.  Final Clinical Impressions(s) / ED Diagnoses   Final diagnoses:  Fall, initial encounter  Dizziness    ED Discharge Orders    None       Malvin Johns, MD 10/21/18 [id number]

## 2018-10-21 NOTE — ED Notes (Signed)
Patient transported to X-ray 

## 2018-10-21 NOTE — ED Provider Notes (Signed)
Care briefly assumed at shift change by Dr. Fredderick Phenix.  See her note for full HPI and work-up.  Patient lives at home alone though is here with increasing falls, some worsening confusion.  Early signs of dementia suspected, though patient is oriented here. U/a and ambulation pending. Imaging so far reassuring, no neuro deficits. Needs to ambulate independently. May need nursing home discussion. Consider case management/HH consult. Physical Exam  BP (!) 164/86   Pulse 66   Temp 98.5 F (36.9 C) (Oral)   Resp 11   SpO2 97%   Physical Exam Vitals signs and nursing note reviewed.  Constitutional:      Appearance: She is well-developed.     Comments: Patient is in no distress.  Conversant.  HENT:     Head: Normocephalic and atraumatic.  Eyes:     Conjunctiva/sclera: Conjunctivae normal.  Cardiovascular:     Rate and Rhythm: Normal rate.  Pulmonary:     Effort: Pulmonary effort is normal.  Abdominal:     Palpations: Abdomen is soft.  Skin:    General: Skin is warm.  Neurological:     Mental Status: She is alert.  Psychiatric:        Behavior: Behavior normal.     ED Course/Procedures   Clinical Course as of Oct 21 2142  Sat Oct 21, 2018  1916 Per NT who ambulated pt, patient ambulated well and did not need assistance. She was assisted as a safety precaution.   [JR]    Clinical Course User Index [JR] Robinson, [place] N, PA-C   Results for orders placed or performed during the hospital encounter of 10/21/18  Comprehensive metabolic panel  Result Value Ref Range   Sodium 141 135 - 145 mmol/L   Potassium 3.5 3.5 - 5.1 mmol/L   Chloride 104 98 - 111 mmol/L   CO2 28 22 - 32 mmol/L   Glucose, Bld 86 70 - 99 mg/dL   BUN 6 (L) 8 - 23 mg/dL   Creatinine, Ser 2.95 0.44 - 1.00 mg/dL   Calcium 9.1 8.9 - 28.4 mg/dL   Total Protein 6.3 (L) 6.5 - 8.1 g/dL   Albumin 3.5 3.5 - 5.0 g/dL   AST 15 15 - 41 U/L   ALT 6 0 - 44 U/L   Alkaline Phosphatase 49 38 - 126 U/L   Total Bilirubin 1.0  0.3 - 1.2 mg/dL   GFR calc non Af Amer >60 >60 mL/min   GFR calc Af Amer >60 >60 mL/min   Anion gap 9 5 - 15  CBC with Differential  Result Value Ref Range   WBC 4.6 4.0 - 10.5 K/uL   RBC 5.46 (H) 3.87 - 5.11 MIL/uL   Hemoglobin 14.1 12.0 - 15.0 g/dL   HCT 13.2 (H) 44.0 - 10.2 %   MCV 87.0 80.0 - 100.0 fL   MCH 25.8 (L) 26.0 - 34.0 pg   MCHC 29.7 (L) 30.0 - 36.0 g/dL   RDW 72.5 36.6 - 44.0 %   Platelets 169 150 - 400 K/uL   nRBC 0.0 0.0 - 0.2 %   Neutrophils Relative % 53 %   Neutro Abs 2.5 1.7 - 7.7 K/uL   Lymphocytes Relative 33 %   Lymphs Abs 1.5 0.7 - 4.0 K/uL   Monocytes Relative 11 %   Monocytes Absolute 0.5 0.1 - 1.0 K/uL   Eosinophils Relative 2 %   Eosinophils Absolute 0.1 0.0 - 0.5 K/uL   Basophils Relative 1 %   Basophils  Absolute 0.0 0.0 - 0.1 K/uL   Immature Granulocytes 0 %   Abs Immature Granulocytes 0.01 0.00 - 0.07 K/uL  Urinalysis, Routine w reflex microscopic  Result Value Ref Range   Color, Urine YELLOW YELLOW   APPearance HAZY (A) CLEAR   Specific Gravity, Urine 1.006 1.005 - 1.030   pH 8.0 5.0 - 8.0   Glucose, UA 50 (A) NEGATIVE mg/dL   Hgb urine dipstick NEGATIVE NEGATIVE   Bilirubin Urine NEGATIVE NEGATIVE   Ketones, ur NEGATIVE NEGATIVE mg/dL   Protein, ur NEGATIVE NEGATIVE mg/dL   Nitrite NEGATIVE NEGATIVE   Leukocytes,Ua TRACE (A) NEGATIVE   RBC / HPF 0-5 0 - 5 RBC/hpf   WBC, UA 6-10 0 - 5 WBC/hpf   Bacteria, UA MANY (A) NONE SEEN   Squamous Epithelial / LPF 21-50 0 - 5   Mucus PRESENT   CK  Result Value Ref Range   Total CK 79 38 - 234 U/L   Dg Thoracic Spine 2 View  Result Date: 10/21/2018 CLINICAL DATA:  Fall with back pain. EXAM: THORACIC SPINE 2 VIEWS COMPARISON:  None. FINDINGS: There is no evidence of thoracic spine fracture. Alignment is normal. No other significant bone abnormalities are identified. IMPRESSION: Negative. Electronically Signed   By: Katherine Mantlehristopher  Green M.D.   On: 10/21/2018 16:09   Dg Elbow Complete  Left  Result Date: 10/21/2018 CLINICAL DATA:  Left elbow pain. EXAM: LEFT ELBOW - COMPLETE 3+ VIEW COMPARISON:  None. FINDINGS: There is no evidence of fracture, dislocation, or joint effusion. There is no evidence of arthropathy or other focal bone abnormality. Soft tissues are unremarkable. IMPRESSION: Negative. Electronically Signed   By: Katherine Mantlehristopher  Green M.D.   On: 10/21/2018 16:11   Ct Head Wo Contrast  Result Date: 10/21/2018 CLINICAL DATA:  Acute pain due to trauma EXAM: CT HEAD WITHOUT CONTRAST CT ORBIT WITHOUT CONTRAST CT CERVICAL SPINE WITHOUT CONTRAST TECHNIQUE: Multidetector CT imaging of the head, cervical spine, and maxillofacial structures were performed using the standard protocol without intravenous contrast. Multiplanar CT image reconstructions of the cervical spine and orbit structures were also generated. COMPARISON:  12/09/2015. FINDINGS: CT HEAD FINDINGS Brain: No evidence of acute infarction, hemorrhage, hydrocephalus, extra-axial collection or mass lesion/mass effect. There is an old right occipital lobe infarct. There are chronic microvascular ischemic changes and multiple old lacunar infarcts. Vascular: No hyperdense vessel or unexpected calcification. Skull: Normal. Negative for fracture or focal lesion. Other: None. CT ORBIT FINDINGS Osseous: There is a chronic appearing fracture of the right lamina papyracea. There is no acute displaced fracture. Orbits: Negative. No traumatic or inflammatory finding. Sinuses: There is mucosal retention cyst in the right maxillary sinus. The remaining paranasal sinuses and mastoid air cells are essentially clear. Soft tissues: Negative. CT CERVICAL SPINE FINDINGS Alignment: There is reversal of the normal cervical lordotic curvature. Skull base and vertebrae: No acute fracture. No primary bone lesion or focal pathologic process. Soft tissues and spinal canal: No prevertebral fluid or swelling. No visible canal hematoma. Disc levels: There is disc  height loss throughout the cervical spine, greatest at the C5-C6 level. Upper chest: Negative. Other: None IMPRESSION: 1. No acute intracranial abnormality. 2. No acute cervical spine fracture. 3. No acute abnormality detected involving the orbits. 4. Chronic ischemic changes are noted as detailed above, similar to prior study in 2017. Electronically Signed   By: Katherine Mantlehristopher  Green M.D.   On: 10/21/2018 15:53   Ct Cervical Spine Wo Contrast  Result Date: 10/21/2018 CLINICAL DATA:  Acute pain due to trauma EXAM: CT HEAD WITHOUT CONTRAST CT ORBIT WITHOUT CONTRAST CT CERVICAL SPINE WITHOUT CONTRAST TECHNIQUE: Multidetector CT imaging of the head, cervical spine, and maxillofacial structures were performed using the standard protocol without intravenous contrast. Multiplanar CT image reconstructions of the cervical spine and orbit structures were also generated. COMPARISON:  12/09/2015. FINDINGS: CT HEAD FINDINGS Brain: No evidence of acute infarction, hemorrhage, hydrocephalus, extra-axial collection or mass lesion/mass effect. There is an old right occipital lobe infarct. There are chronic microvascular ischemic changes and multiple old lacunar infarcts. Vascular: No hyperdense vessel or unexpected calcification. Skull: Normal. Negative for fracture or focal lesion. Other: None. CT ORBIT FINDINGS Osseous: There is a chronic appearing fracture of the right lamina papyracea. There is no acute displaced fracture. Orbits: Negative. No traumatic or inflammatory finding. Sinuses: There is mucosal retention cyst in the right maxillary sinus. The remaining paranasal sinuses and mastoid air cells are essentially clear. Soft tissues: Negative. CT CERVICAL SPINE FINDINGS Alignment: There is reversal of the normal cervical lordotic curvature. Skull base and vertebrae: No acute fracture. No primary bone lesion or focal pathologic process. Soft tissues and spinal canal: No prevertebral fluid or swelling. No visible canal  hematoma. Disc levels: There is disc height loss throughout the cervical spine, greatest at the C5-C6 level. Upper chest: Negative. Other: None IMPRESSION: 1. No acute intracranial abnormality. 2. No acute cervical spine fracture. 3. No acute abnormality detected involving the orbits. 4. Chronic ischemic changes are noted as detailed above, similar to prior study in 2017. Electronically Signed   By: Katherine Mantlehristopher  Green M.D.   On: 10/21/2018 15:53   Dg Knee Complete 4 Views Left  Result Date: 10/21/2018 CLINICAL DATA:  Pain status post fall EXAM: LEFT KNEE - COMPLETE 4+ VIEW COMPARISON:  None. FINDINGS: There is no acute displaced fracture. No dislocation. Multicompartmental degenerative changes are noted in the knee, greatest in the medial and patellofemoral compartments. Vascular stents are noted. Advanced vascular calcifications are noted. There appears to be a trace suprapatellar joint effusion. IMPRESSION: No acute osseous abnormality. Electronically Signed   By: Katherine Mantlehristopher  Green M.D.   On: 10/21/2018 16:11   Ct Orbits Wo Contrast  Result Date: 10/21/2018 CLINICAL DATA:  Acute pain due to trauma EXAM: CT HEAD WITHOUT CONTRAST CT ORBIT WITHOUT CONTRAST CT CERVICAL SPINE WITHOUT CONTRAST TECHNIQUE: Multidetector CT imaging of the head, cervical spine, and maxillofacial structures were performed using the standard protocol without intravenous contrast. Multiplanar CT image reconstructions of the cervical spine and orbit structures were also generated. COMPARISON:  12/09/2015. FINDINGS: CT HEAD FINDINGS Brain: No evidence of acute infarction, hemorrhage, hydrocephalus, extra-axial collection or mass lesion/mass effect. There is an old right occipital lobe infarct. There are chronic microvascular ischemic changes and multiple old lacunar infarcts. Vascular: No hyperdense vessel or unexpected calcification. Skull: Normal. Negative for fracture or focal lesion. Other: None. CT ORBIT FINDINGS Osseous: There is  a chronic appearing fracture of the right lamina papyracea. There is no acute displaced fracture. Orbits: Negative. No traumatic or inflammatory finding. Sinuses: There is mucosal retention cyst in the right maxillary sinus. The remaining paranasal sinuses and mastoid air cells are essentially clear. Soft tissues: Negative. CT CERVICAL SPINE FINDINGS Alignment: There is reversal of the normal cervical lordotic curvature. Skull base and vertebrae: No acute fracture. No primary bone lesion or focal pathologic process. Soft tissues and spinal canal: No prevertebral fluid or swelling. No visible canal hematoma. Disc levels: There is disc height loss throughout the cervical spine,  greatest at the C5-C6 level. Upper chest: Negative. Other: None IMPRESSION: 1. No acute intracranial abnormality. 2. No acute cervical spine fracture. 3. No acute abnormality detected involving the orbits. 4. Chronic ischemic changes are noted as detailed above, similar to prior study in 2017. Electronically Signed   By: Constance Holster M.D.   On: 10/21/2018 15:53    Procedures  MDM  UA appears contaminated.  Culture sent.  Discussion was had with patient and her son who is at bedside regarding increased help at home and possible need for home health.  Her son reports that he will be sure a family member stays with her.  They do agree with home health consult, orders placed.  I encouraged close PCP follow-up and return precautions are discussed.  Patient and son agreeable to plan and patient is safe for discharge.       Robinson, [place] N, PA-C 10/21/18 2148    Malvin Johns, MD 10/22/18 [phone]

## 2018-10-22 NOTE — TOC Initial Note (Addendum)
Transition of Care [facility]) - Initial/Assessment Note    Patient Details  Name: [name] MRN: [id number] Date of Birth: 1940-07-19  [facility] CM/SW Contact:    Erenest Rasher, RN Phone Number:  [phone] 10/22/2018, 4:15 PM  Clinical Narrative:                 Spoke to pt and states she lives in [place] apt. She had two falls in the past few weeks. Had a telephone visit with her PCP two months ago. Agreeable to [facility]. Contacted [facility] rep, Dorian Pod with new referral and waiting call back. Pt gave permission to speak son. Pt reports she was driving prior to falls. Reports her appetite has been poor.   Expected Discharge Plan: [place] Barriers to Discharge: No Barriers Identified   Patient Goals and CMS Choice Patient states their goals for this hospitalization and ongoing recovery are:: had fall at home CMS Medicare.gov Compare Post Acute Care list provided to:: Patient Choice offered to / list presented to : Patient  Expected Discharge Plan and Services Expected Discharge Plan: [place]   Discharge Planning Services: CM Consult Post Acute Care Choice: [place] arrangements for the past 2 months: Apartment                                      Prior Living Arrangements/Services Living arrangements for the past 2 months: Apartment Lives with:: Self Patient language and need for interpreter reviewed:: Yes Do you feel safe going back to the place where you live?: Yes      Need for Family Participation in Patient Care: Yes (Comment) Care giver support system in place?: Yes (comment) Current home services: DME(cane) Criminal Activity/Legal Involvement Pertinent to Current Situation/Hospitalization: No - Comment as needed  Activities of Daily Living      Permission Sought/Granted Permission sought to share information with : Case Manager, PCP, Family Supports Permission granted to share  information with : Yes, Verbal Permission Granted  Share Information with NAME: Samuel Bouche  Permission granted to share info w AGENCY: [place] granted to share info w Relationship: son  Permission granted to share info w Contact Information: [phone]  Emotional Assessment       Orientation: : Oriented to Self, Oriented to Place, Oriented to  Time, Oriented to Situation   Psych Involvement: No (comment)  Admission diagnosis:  fall/knee pain Patient Active Problem List   Diagnosis Date Noted  . Status post left rotator cuff repair 02/13/2018  . Complete rotator cuff tear of left shoulder 01/27/2018  . S/P left rotator cuff repair 01/27/2018  . PVD (peripheral vascular disease) with claudication ([place]) 05/24/2012  . Peripheral vascular disease, unspecified ([place]) 11/24/2011  . HYPERCHOLESTEROLEMIA 04/21/2006  . OBESITY, NOS 04/21/2006  . DEPRESSION, MAJOR, RECURRENT 04/21/2006  . TOBACCO DEPENDENCE 04/21/2006  . HYPERTENSION, BENIGN SYSTEMIC 04/21/2006  . HAND PAIN 04/21/2006   PCP:  Marton Redwood, MD Pharmacy:   [id number] - [place], [place] [address] [place] [address] Phone: [phone] Fax: [phone]     Social Determinants of Health (SDOH) Interventions    Readmission Risk Interventions No flowsheet data found.

## 2018-10-23 DIAGNOSIS — R296 Repeated falls: Secondary | ICD-10-CM | POA: Diagnosis not present

## 2018-10-23 DIAGNOSIS — I69951 Hemiplegia and hemiparesis following unspecified cerebrovascular disease affecting right dominant side: Secondary | ICD-10-CM | POA: Diagnosis not present

## 2018-10-23 DIAGNOSIS — N390 Urinary tract infection, site not specified: Secondary | ICD-10-CM | POA: Diagnosis not present

## 2018-10-23 DIAGNOSIS — R2689 Other abnormalities of gait and mobility: Secondary | ICD-10-CM | POA: Diagnosis not present

## 2018-10-23 DIAGNOSIS — Z5181 Encounter for therapeutic drug level monitoring: Secondary | ICD-10-CM | POA: Diagnosis not present

## 2018-10-24 NOTE — Progress Notes (Signed)
[facility] accepted referral. Jonnie Finner RN CCM, [place] ED TOC CM [phone]

## 2018-10-27 DIAGNOSIS — Z9181 History of falling: Secondary | ICD-10-CM | POA: Diagnosis not present

## 2018-10-27 DIAGNOSIS — Z87891 Personal history of nicotine dependence: Secondary | ICD-10-CM | POA: Diagnosis not present

## 2018-10-27 DIAGNOSIS — I10 Essential (primary) hypertension: Secondary | ICD-10-CM | POA: Diagnosis not present

## 2018-10-27 DIAGNOSIS — E7800 Pure hypercholesterolemia, unspecified: Secondary | ICD-10-CM | POA: Diagnosis not present

## 2018-10-27 DIAGNOSIS — I739 Peripheral vascular disease, unspecified: Secondary | ICD-10-CM | POA: Diagnosis not present

## 2018-10-27 DIAGNOSIS — R69 Illness, unspecified: Secondary | ICD-10-CM | POA: Diagnosis not present

## 2018-10-27 DIAGNOSIS — R32 Unspecified urinary incontinence: Secondary | ICD-10-CM | POA: Diagnosis not present

## 2018-10-31 ENCOUNTER — Other Ambulatory Visit: Payer: Self-pay

## 2018-10-31 ENCOUNTER — Encounter (HOSPITAL_COMMUNITY): Payer: Self-pay | Admitting: Emergency Medicine

## 2018-10-31 ENCOUNTER — Emergency Department (HOSPITAL_COMMUNITY)
Admission: EM | Admit: 2018-10-31 | Discharge: 2018-10-31 | Disposition: A | Discharge status: Home / Self Care | Payer: Medicare HMO | Attending: Emergency Medicine | Admitting: Emergency Medicine

## 2018-10-31 DIAGNOSIS — R32 Unspecified urinary incontinence: Secondary | ICD-10-CM | POA: Diagnosis not present

## 2018-10-31 DIAGNOSIS — E876 Hypokalemia: Secondary | ICD-10-CM | POA: Diagnosis not present

## 2018-10-31 DIAGNOSIS — R001 Bradycardia, unspecified: Secondary | ICD-10-CM | POA: Diagnosis not present

## 2018-10-31 DIAGNOSIS — Z8673 Personal history of transient ischemic attack (TIA), and cerebral infarction without residual deficits: Secondary | ICD-10-CM | POA: Insufficient documentation

## 2018-10-31 DIAGNOSIS — Z87891 Personal history of nicotine dependence: Secondary | ICD-10-CM | POA: Diagnosis not present

## 2018-10-31 DIAGNOSIS — Z7982 Long term (current) use of aspirin: Secondary | ICD-10-CM | POA: Insufficient documentation

## 2018-10-31 DIAGNOSIS — Z9181 History of falling: Secondary | ICD-10-CM | POA: Diagnosis not present

## 2018-10-31 DIAGNOSIS — I10 Essential (primary) hypertension: Secondary | ICD-10-CM | POA: Insufficient documentation

## 2018-10-31 DIAGNOSIS — Z79899 Other long term (current) drug therapy: Secondary | ICD-10-CM | POA: Diagnosis not present

## 2018-10-31 DIAGNOSIS — I739 Peripheral vascular disease, unspecified: Secondary | ICD-10-CM | POA: Diagnosis not present

## 2018-10-31 DIAGNOSIS — R69 Illness, unspecified: Secondary | ICD-10-CM | POA: Diagnosis not present

## 2018-10-31 DIAGNOSIS — E7800 Pure hypercholesterolemia, unspecified: Secondary | ICD-10-CM | POA: Diagnosis not present

## 2018-10-31 LAB — CBC WITH DIFFERENTIAL/PLATELET
Abs Immature Granulocytes: 0.01 10*3/uL (ref 0.00–0.07)
Basophils Absolute: 0.1 10*3/uL (ref 0.0–0.1)
Basophils Relative: 2 %
Eosinophils Absolute: 0.1 10*3/uL (ref 0.0–0.5)
Eosinophils Relative: 2 %
HCT: 45 % (ref 36.0–46.0)
Hemoglobin: 13.4 g/dL (ref 12.0–15.0)
Immature Granulocytes: 0 %
Lymphocytes Relative: 37 %
Lymphs Abs: 1.7 10*3/uL (ref 0.7–4.0)
MCH: 25.8 pg — ABNORMAL LOW (ref 26.0–34.0)
MCHC: 29.8 g/dL — ABNORMAL LOW (ref 30.0–36.0)
MCV: 86.7 fL (ref 80.0–100.0)
Monocytes Absolute: 0.5 10*3/uL (ref 0.1–1.0)
Monocytes Relative: 10 %
Neutro Abs: 2.3 10*3/uL (ref 1.7–7.7)
Neutrophils Relative %: 49 %
Platelets: 195 10*3/uL (ref 150–400)
RBC: 5.19 MIL/uL — ABNORMAL HIGH (ref 3.87–5.11)
RDW: 14.3 % (ref 11.5–15.5)
WBC: 4.7 10*3/uL (ref 4.0–10.5)
nRBC: 0 % (ref 0.0–0.2)

## 2018-10-31 LAB — BASIC METABOLIC PANEL
Anion gap: 8 (ref 5–15)
BUN: 8 mg/dL (ref 8–23)
CO2: 27 mmol/L (ref 22–32)
Calcium: 8.9 mg/dL (ref 8.9–10.3)
Chloride: 105 mmol/L (ref 98–111)
Creatinine, Ser: 0.84 mg/dL (ref 0.44–1.00)
GFR calc Af Amer: >60 mL/min (ref >60)
GFR calc non Af Amer: >60 mL/min (ref >60)
Glucose, Bld: 97 mg/dL (ref 70–99)
Potassium: 3.2 mmol/L — ABNORMAL LOW (ref 3.5–5.1)
Sodium: 140 mmol/L (ref 135–145)

## 2018-10-31 MED ORDER — POTASSIUM CHLORIDE CRYS ER 20 MEQ PO TBCR
40.0000 meq | EXTENDED_RELEASE_TABLET | Freq: Once | ORAL | Status: AC
  Administered 2018-10-31: 40 meq via ORAL
  Filled 2018-10-31: qty 2

## 2018-10-31 NOTE — ED Notes (Signed)
Per patient request, patient's sister called to provide transport and d/c paperwork reviewed with sister.

## 2018-10-31 NOTE — Discharge Instructions (Signed)
It was my pleasure taking care of you today!   Take your blood pressure medications as directed.   Follow up with your primary care doctor.   Return to ER for new or worsening symptoms, any additional concerns.

## 2018-10-31 NOTE — ED Notes (Signed)
IV placed by EMS removed from L AC.

## 2018-10-31 NOTE — ED Triage Notes (Signed)
Per EMS, patient from home, seen at PCP and prescribed new BP meds that she has not yet started. Called out by home aid for BP 451 systolic. EMS reports 460 systolic upon arrival. Hx stroke. A&ox4 and ambulatory. Denies chest pain and headache.

## 2018-10-31 NOTE — ED Provider Notes (Signed)
[facility]-EMERGENCY DEPT Provider Note   CSN: [id number] Arrival date & time: 10/31/18  1830     History   Chief Complaint Chief Complaint  Patient presents with  . Hypertension    HPI [name] is a 78 y.o. female.     The history is provided by the patient and medical records. No language interpreter was used.  Hypertension Pertinent negatives include no chest pain, no abdominal pain, no headaches and no shortness of breath.   [name] is a 78 y.o. female  with a PMH as listed below including HTN who presents to the Emergency Department for evaluation due to elevated blood pressure readings.  Home aide checked vitals and noted blood pressure of 220 systolic.  EMS states they checked her blood pressure upon arrival and it was in the 120s systolic.  She denies any symptoms.  She states that her doctor was going to start her on the new blood pressure medication, but she has not started taking it yet.   Past Medical History:  Diagnosis Date  . Arthritis   . Depression   . High cholesterol   . Hypertension   . Pneumonia   . Stroke [facility])    1994 no residuals    Patient Active Problem List   Diagnosis Date Noted  . Status post left rotator cuff repair 02/13/2018  . Complete rotator cuff tear of left shoulder 01/27/2018  . S/P left rotator cuff repair 01/27/2018  . PVD (peripheral vascular disease) with claudication ([facility]) 05/24/2012  . Peripheral vascular disease, unspecified ([facility]) 11/24/2011  . HYPERCHOLESTEROLEMIA 04/21/2006  . OBESITY, NOS 04/21/2006  . DEPRESSION, MAJOR, RECURRENT 04/21/2006  . TOBACCO DEPENDENCE 04/21/2006  . HYPERTENSION, BENIGN SYSTEMIC 04/21/2006  . HAND PAIN 04/21/2006    Past Surgical History:  Procedure Laterality Date  . ABDOMINAL HYSTERECTOMY    . catheterization of the left superficial femoral artery  10/20/2009  . SHOULDER ARTHROSCOPY WITH ROTATOR CUFF REPAIR AND SUBACROMIAL DECOMPRESSION Left 01/27/2018   Procedure: LEFT SHOULDER ARTHROSCOPY WITH DEBRIDEMENT, SUBACROMIAL DECOMPRESSION AND ROTATOR CUFF REPAIR;  Surgeon: Kathryne Hitch, MD;  Location: [facility] ORS;  Service: Orthopedics;  Laterality: Left;  . SHOULDER SURGERY     Left with dr. Gayla Doss arthroscopy with possible rotator cuff repair  . TONSILLECTOMY       OB History   No obstetric history on file.      Home Medications    Prior to Admission medications   Medication Sig Start Date End Date Taking? Authorizing Provider  aspirin 81 MG tablet Take 81 mg by mouth daily.   Yes [provider]  Calcium Carbonate-Vitamin D (CALCIUM 600+D HIGH POTENCY PO) Take 1 tablet by mouth daily.    Yes [provider]  Cholecalciferol (VITAMIN D) 2000 UNITS tablet Take 2,000 Units by mouth daily.   Yes [provider]  lisinopril (PRINIVIL,ZESTRIL) 40 MG tablet Take 40 mg by mouth daily.   Yes [provider]  escitalopram (LEXAPRO) 10 MG tablet 1  qam Patient not taking: Reported on 10/21/2018 04/23/16   Archer Asa, MD  hydrALAZINE (APRESOLINE) 25 MG tablet Take 25 mg by mouth 3 (three) times daily as needed for anxiety. 10/31/18   [provider]  HYDROcodone-acetaminophen (NORCO/VICODIN) 5-325 MG tablet Take 1-2 tablets by mouth every 4 (four) hours as needed for moderate pain (pain score 4-6). Patient not taking: Reported on 10/21/2018 01/28/18   Kathryne Hitch, MD    Family History Family History  Problem Relation Age of Onset  . Cancer Mother   . Diabetes Mother   . Heart disease Mother   . Hypertension Mother   . Heart attack Mother   . Hyperlipidemia Mother   . Hypertension Father   . Diabetes Sister   . Heart disease Sister   . Hyperlipidemia Sister   . Hypertension Sister   . Cancer Sister   . Diabetes Brother   . Heart disease Brother   . Hypertension Brother   . Cancer Brother   . Hyperlipidemia Brother   . Heart disease Son   . Heart attack Son         amputation    Social History Social History   Tobacco Use  . Smoking status: Former Smoker    Packs/day: 0.50    Types: Cigarettes    Quit date: 12/17/2017    Years since quitting: 0.8  . Smokeless tobacco: Never Used  . Tobacco comment: using nic patches  Substance Use Topics  . Alcohol use: Not Currently  . Drug use: No     Allergies   Penicillins, Shellfish allergy, and Zoloft [sertraline]   Review of Systems Review of Systems  Eyes: Negative for photophobia and visual disturbance.  Respiratory: Negative for shortness of breath.   Cardiovascular: Negative for chest pain.  Gastrointestinal: Negative for abdominal pain, nausea and vomiting.  Neurological: Negative for dizziness, syncope, weakness, light-headedness, numbness and headaches.  All other systems reviewed and are negative.    Physical Exam Updated Vital Signs BP (!) 168/83   Pulse (!) 49   Temp 98.7 F (37.1 C)   Resp 18   SpO2 98%   Physical Exam Vitals signs and nursing note reviewed.  Constitutional:      General: She is not in acute distress.    Appearance: She is well-developed.  HENT:     Head: Normocephalic and atraumatic.  Neck:     Musculoskeletal: Neck supple.  Cardiovascular:     Rate and Rhythm: Normal rate and regular rhythm.     Heart sounds: Normal heart sounds. No murmur.  Pulmonary:     Effort: Pulmonary effort is normal. No respiratory distress.     Breath sounds: Normal breath sounds.  Abdominal:     General: There is no distension.     Palpations: Abdomen is soft.     Tenderness: There is no abdominal tenderness.  Skin:    General: Skin is warm and dry.  Neurological:     Mental Status: She is alert and oriented to person, place, and time.      ED Treatments / Results  Labs (all labs ordered are listed, but only abnormal results are displayed) Labs Reviewed  BASIC METABOLIC PANEL - Abnormal; Notable for the following components:      Result Value   Potassium  3.2 (*)    All other components within normal limits  CBC WITH DIFFERENTIAL/PLATELET - Abnormal; Notable for the following components:   RBC 5.19 (*)    MCH 25.8 (*)    MCHC 29.8 (*)    All other components within normal limits    EKG EKG Interpretation  Date/Time:  Tuesday October 31 2018 19:33:57 EDT Ventricular Rate:  46 PR Interval:    QRS Duration: 87 QT Interval:  467 QTC Calculation: 409 R Axis:   60 Text Interpretation:  Sinus bradycardia Probable left atrial enlargement Probable LVH with secondary repol abnrm Confirmed by Geoffery LyonseLo, [place] ([id number]) on 10/31/2018 8:51:00 PM   Radiology  No results found.  Procedures Procedures (including critical care time)  Medications Ordered in ED Medications  potassium chloride SA (K-DUR) CR tablet 40 mEq (has no administration in time range)     Initial Impression / Assessment and Plan / ED Course  I have reviewed the triage vital signs and the nursing notes.  Pertinent labs & imaging results that were available during my care of the patient were reviewed by me and considered in my medical decision making (see chart for details).       [name] is a 78 y.o. female who presents to ED for elevated blood pressure. Patient is currently asymptomatic with no sxs to suggest end organ damage. No chest pain, diaphoresis, nausea or other ACS symptoms. No headache or neurologic complaints. No change in urine output. Normal cardiopulmonary exam. Intact and equal distal pulses. Labs reviewed and reassuring. Mild hypok which was replenished in ED. Evaluation does not show pathology that would require ongoing emergent intervention or inpatient treatment.Stressed the importance of taking blood pressure medication as directed.  Patient understands importance of close PCP follow up for further BP management. Reasons to return to ER were discussed and all questions answered.    Patient discussed with Dr. Stark Jock who agrees with treatment plan.     Final Clinical Impressions(s) / ED Diagnoses   Final diagnoses:  Hypertension, unspecified type  Hypokalemia    ED Discharge Orders    None       Ward, Ozella Almond, PA-C 10/31/18 2127    Veryl Speak, MD 10/31/18 2235

## 2018-11-02 DIAGNOSIS — Z9181 History of falling: Secondary | ICD-10-CM | POA: Diagnosis not present

## 2018-11-02 DIAGNOSIS — I739 Peripheral vascular disease, unspecified: Secondary | ICD-10-CM | POA: Diagnosis not present

## 2018-11-02 DIAGNOSIS — Z87891 Personal history of nicotine dependence: Secondary | ICD-10-CM | POA: Diagnosis not present

## 2018-11-02 DIAGNOSIS — E7800 Pure hypercholesterolemia, unspecified: Secondary | ICD-10-CM | POA: Diagnosis not present

## 2018-11-02 DIAGNOSIS — R32 Unspecified urinary incontinence: Secondary | ICD-10-CM | POA: Diagnosis not present

## 2018-11-02 DIAGNOSIS — R69 Illness, unspecified: Secondary | ICD-10-CM | POA: Diagnosis not present

## 2018-11-02 DIAGNOSIS — I10 Essential (primary) hypertension: Secondary | ICD-10-CM | POA: Diagnosis not present

## 2018-11-03 DIAGNOSIS — Z87891 Personal history of nicotine dependence: Secondary | ICD-10-CM | POA: Diagnosis not present

## 2018-11-03 DIAGNOSIS — R69 Illness, unspecified: Secondary | ICD-10-CM | POA: Diagnosis not present

## 2018-11-03 DIAGNOSIS — I739 Peripheral vascular disease, unspecified: Secondary | ICD-10-CM | POA: Diagnosis not present

## 2018-11-03 DIAGNOSIS — R32 Unspecified urinary incontinence: Secondary | ICD-10-CM | POA: Diagnosis not present

## 2018-11-03 DIAGNOSIS — E7800 Pure hypercholesterolemia, unspecified: Secondary | ICD-10-CM | POA: Diagnosis not present

## 2018-11-03 DIAGNOSIS — Z9181 History of falling: Secondary | ICD-10-CM | POA: Diagnosis not present

## 2018-11-03 DIAGNOSIS — I10 Essential (primary) hypertension: Secondary | ICD-10-CM | POA: Diagnosis not present

## 2018-11-06 DIAGNOSIS — R2689 Other abnormalities of gait and mobility: Secondary | ICD-10-CM | POA: Diagnosis not present

## 2018-11-06 DIAGNOSIS — R69 Illness, unspecified: Secondary | ICD-10-CM | POA: Diagnosis not present

## 2018-11-06 DIAGNOSIS — I69951 Hemiplegia and hemiparesis following unspecified cerebrovascular disease affecting right dominant side: Secondary | ICD-10-CM | POA: Diagnosis not present

## 2018-11-06 DIAGNOSIS — Z5181 Encounter for therapeutic drug level monitoring: Secondary | ICD-10-CM | POA: Diagnosis not present

## 2018-11-06 DIAGNOSIS — I10 Essential (primary) hypertension: Secondary | ICD-10-CM | POA: Diagnosis not present

## 2018-11-07 DIAGNOSIS — R69 Illness, unspecified: Secondary | ICD-10-CM | POA: Diagnosis not present

## 2018-11-07 DIAGNOSIS — R32 Unspecified urinary incontinence: Secondary | ICD-10-CM | POA: Diagnosis not present

## 2018-11-07 DIAGNOSIS — I10 Essential (primary) hypertension: Secondary | ICD-10-CM | POA: Diagnosis not present

## 2018-11-07 DIAGNOSIS — Z9181 History of falling: Secondary | ICD-10-CM | POA: Diagnosis not present

## 2018-11-07 DIAGNOSIS — E7800 Pure hypercholesterolemia, unspecified: Secondary | ICD-10-CM | POA: Diagnosis not present

## 2018-11-07 DIAGNOSIS — Z87891 Personal history of nicotine dependence: Secondary | ICD-10-CM | POA: Diagnosis not present

## 2018-11-07 DIAGNOSIS — I739 Peripheral vascular disease, unspecified: Secondary | ICD-10-CM | POA: Diagnosis not present

## 2018-11-08 DIAGNOSIS — I10 Essential (primary) hypertension: Secondary | ICD-10-CM | POA: Diagnosis not present

## 2018-11-08 DIAGNOSIS — I739 Peripheral vascular disease, unspecified: Secondary | ICD-10-CM | POA: Diagnosis not present

## 2018-11-08 DIAGNOSIS — Z87891 Personal history of nicotine dependence: Secondary | ICD-10-CM | POA: Diagnosis not present

## 2018-11-08 DIAGNOSIS — R69 Illness, unspecified: Secondary | ICD-10-CM | POA: Diagnosis not present

## 2018-11-08 DIAGNOSIS — R32 Unspecified urinary incontinence: Secondary | ICD-10-CM | POA: Diagnosis not present

## 2018-11-08 DIAGNOSIS — E7800 Pure hypercholesterolemia, unspecified: Secondary | ICD-10-CM | POA: Diagnosis not present

## 2018-11-08 DIAGNOSIS — Z9181 History of falling: Secondary | ICD-10-CM | POA: Diagnosis not present

## 2018-11-09 DIAGNOSIS — I739 Peripheral vascular disease, unspecified: Secondary | ICD-10-CM | POA: Diagnosis not present

## 2018-11-09 DIAGNOSIS — R69 Illness, unspecified: Secondary | ICD-10-CM | POA: Diagnosis not present

## 2018-11-09 DIAGNOSIS — I10 Essential (primary) hypertension: Secondary | ICD-10-CM | POA: Diagnosis not present

## 2018-11-09 DIAGNOSIS — R32 Unspecified urinary incontinence: Secondary | ICD-10-CM | POA: Diagnosis not present

## 2018-11-09 DIAGNOSIS — E7800 Pure hypercholesterolemia, unspecified: Secondary | ICD-10-CM | POA: Diagnosis not present

## 2018-11-09 DIAGNOSIS — Z9181 History of falling: Secondary | ICD-10-CM | POA: Diagnosis not present

## 2018-11-09 DIAGNOSIS — Z87891 Personal history of nicotine dependence: Secondary | ICD-10-CM | POA: Diagnosis not present

## 2018-11-10 DIAGNOSIS — R32 Unspecified urinary incontinence: Secondary | ICD-10-CM | POA: Diagnosis not present

## 2018-11-10 DIAGNOSIS — Z87891 Personal history of nicotine dependence: Secondary | ICD-10-CM | POA: Diagnosis not present

## 2018-11-10 DIAGNOSIS — R69 Illness, unspecified: Secondary | ICD-10-CM | POA: Diagnosis not present

## 2018-11-10 DIAGNOSIS — Z9181 History of falling: Secondary | ICD-10-CM | POA: Diagnosis not present

## 2018-11-10 DIAGNOSIS — I739 Peripheral vascular disease, unspecified: Secondary | ICD-10-CM | POA: Diagnosis not present

## 2018-11-10 DIAGNOSIS — E7800 Pure hypercholesterolemia, unspecified: Secondary | ICD-10-CM | POA: Diagnosis not present

## 2018-11-10 DIAGNOSIS — I10 Essential (primary) hypertension: Secondary | ICD-10-CM | POA: Diagnosis not present

## 2018-11-14 DIAGNOSIS — R69 Illness, unspecified: Secondary | ICD-10-CM | POA: Diagnosis not present

## 2018-11-14 DIAGNOSIS — R32 Unspecified urinary incontinence: Secondary | ICD-10-CM | POA: Diagnosis not present

## 2018-11-14 DIAGNOSIS — Z9181 History of falling: Secondary | ICD-10-CM | POA: Diagnosis not present

## 2018-11-14 DIAGNOSIS — I739 Peripheral vascular disease, unspecified: Secondary | ICD-10-CM | POA: Diagnosis not present

## 2018-11-14 DIAGNOSIS — I10 Essential (primary) hypertension: Secondary | ICD-10-CM | POA: Diagnosis not present

## 2018-11-14 DIAGNOSIS — Z87891 Personal history of nicotine dependence: Secondary | ICD-10-CM | POA: Diagnosis not present

## 2018-11-14 DIAGNOSIS — E7800 Pure hypercholesterolemia, unspecified: Secondary | ICD-10-CM | POA: Diagnosis not present

## 2018-11-15 DIAGNOSIS — R32 Unspecified urinary incontinence: Secondary | ICD-10-CM | POA: Diagnosis not present

## 2018-11-15 DIAGNOSIS — I739 Peripheral vascular disease, unspecified: Secondary | ICD-10-CM | POA: Diagnosis not present

## 2018-11-15 DIAGNOSIS — R69 Illness, unspecified: Secondary | ICD-10-CM | POA: Diagnosis not present

## 2018-11-15 DIAGNOSIS — I10 Essential (primary) hypertension: Secondary | ICD-10-CM | POA: Diagnosis not present

## 2018-11-15 DIAGNOSIS — Z87891 Personal history of nicotine dependence: Secondary | ICD-10-CM | POA: Diagnosis not present

## 2018-11-15 DIAGNOSIS — E7800 Pure hypercholesterolemia, unspecified: Secondary | ICD-10-CM | POA: Diagnosis not present

## 2018-11-15 DIAGNOSIS — Z9181 History of falling: Secondary | ICD-10-CM | POA: Diagnosis not present

## 2018-11-16 DIAGNOSIS — Z87891 Personal history of nicotine dependence: Secondary | ICD-10-CM | POA: Diagnosis not present

## 2018-11-16 DIAGNOSIS — R32 Unspecified urinary incontinence: Secondary | ICD-10-CM | POA: Diagnosis not present

## 2018-11-16 DIAGNOSIS — I10 Essential (primary) hypertension: Secondary | ICD-10-CM | POA: Diagnosis not present

## 2018-11-16 DIAGNOSIS — E7800 Pure hypercholesterolemia, unspecified: Secondary | ICD-10-CM | POA: Diagnosis not present

## 2018-11-16 DIAGNOSIS — I739 Peripheral vascular disease, unspecified: Secondary | ICD-10-CM | POA: Diagnosis not present

## 2018-11-16 DIAGNOSIS — R69 Illness, unspecified: Secondary | ICD-10-CM | POA: Diagnosis not present

## 2018-11-16 DIAGNOSIS — Z9181 History of falling: Secondary | ICD-10-CM | POA: Diagnosis not present

## 2018-11-21 DIAGNOSIS — Z9181 History of falling: Secondary | ICD-10-CM | POA: Diagnosis not present

## 2018-11-21 DIAGNOSIS — I739 Peripheral vascular disease, unspecified: Secondary | ICD-10-CM | POA: Diagnosis not present

## 2018-11-21 DIAGNOSIS — Z87891 Personal history of nicotine dependence: Secondary | ICD-10-CM | POA: Diagnosis not present

## 2018-11-21 DIAGNOSIS — I10 Essential (primary) hypertension: Secondary | ICD-10-CM | POA: Diagnosis not present

## 2018-11-21 DIAGNOSIS — R69 Illness, unspecified: Secondary | ICD-10-CM | POA: Diagnosis not present

## 2018-11-21 DIAGNOSIS — E7800 Pure hypercholesterolemia, unspecified: Secondary | ICD-10-CM | POA: Diagnosis not present

## 2018-11-21 DIAGNOSIS — R32 Unspecified urinary incontinence: Secondary | ICD-10-CM | POA: Diagnosis not present

## 2018-11-22 DIAGNOSIS — I10 Essential (primary) hypertension: Secondary | ICD-10-CM | POA: Diagnosis not present

## 2018-11-22 DIAGNOSIS — R69 Illness, unspecified: Secondary | ICD-10-CM | POA: Diagnosis not present

## 2018-11-23 DIAGNOSIS — Z9181 History of falling: Secondary | ICD-10-CM | POA: Diagnosis not present

## 2018-11-23 DIAGNOSIS — R69 Illness, unspecified: Secondary | ICD-10-CM | POA: Diagnosis not present

## 2018-11-23 DIAGNOSIS — R32 Unspecified urinary incontinence: Secondary | ICD-10-CM | POA: Diagnosis not present

## 2018-11-23 DIAGNOSIS — Z87891 Personal history of nicotine dependence: Secondary | ICD-10-CM | POA: Diagnosis not present

## 2018-11-23 DIAGNOSIS — I739 Peripheral vascular disease, unspecified: Secondary | ICD-10-CM | POA: Diagnosis not present

## 2018-11-23 DIAGNOSIS — I10 Essential (primary) hypertension: Secondary | ICD-10-CM | POA: Diagnosis not present

## 2018-11-23 DIAGNOSIS — E7800 Pure hypercholesterolemia, unspecified: Secondary | ICD-10-CM | POA: Diagnosis not present

## 2018-11-24 DIAGNOSIS — I10 Essential (primary) hypertension: Secondary | ICD-10-CM | POA: Diagnosis not present

## 2018-11-24 DIAGNOSIS — E7800 Pure hypercholesterolemia, unspecified: Secondary | ICD-10-CM | POA: Diagnosis not present

## 2018-11-24 DIAGNOSIS — R69 Illness, unspecified: Secondary | ICD-10-CM | POA: Diagnosis not present

## 2018-11-24 DIAGNOSIS — I739 Peripheral vascular disease, unspecified: Secondary | ICD-10-CM | POA: Diagnosis not present

## 2018-11-24 DIAGNOSIS — Z9181 History of falling: Secondary | ICD-10-CM | POA: Diagnosis not present

## 2018-11-24 DIAGNOSIS — Z87891 Personal history of nicotine dependence: Secondary | ICD-10-CM | POA: Diagnosis not present

## 2018-11-24 DIAGNOSIS — R32 Unspecified urinary incontinence: Secondary | ICD-10-CM | POA: Diagnosis not present

## 2018-11-27 DIAGNOSIS — R69 Illness, unspecified: Secondary | ICD-10-CM | POA: Diagnosis not present

## 2018-11-27 DIAGNOSIS — E7800 Pure hypercholesterolemia, unspecified: Secondary | ICD-10-CM | POA: Diagnosis not present

## 2018-11-27 DIAGNOSIS — I10 Essential (primary) hypertension: Secondary | ICD-10-CM | POA: Diagnosis not present

## 2018-11-27 DIAGNOSIS — Z9181 History of falling: Secondary | ICD-10-CM | POA: Diagnosis not present

## 2018-11-27 DIAGNOSIS — Z87891 Personal history of nicotine dependence: Secondary | ICD-10-CM | POA: Diagnosis not present

## 2018-11-27 DIAGNOSIS — R32 Unspecified urinary incontinence: Secondary | ICD-10-CM | POA: Diagnosis not present

## 2018-11-27 DIAGNOSIS — I739 Peripheral vascular disease, unspecified: Secondary | ICD-10-CM | POA: Diagnosis not present

## 2018-11-28 DIAGNOSIS — R32 Unspecified urinary incontinence: Secondary | ICD-10-CM | POA: Diagnosis not present

## 2018-11-28 DIAGNOSIS — I10 Essential (primary) hypertension: Secondary | ICD-10-CM | POA: Diagnosis not present

## 2018-11-28 DIAGNOSIS — Z87891 Personal history of nicotine dependence: Secondary | ICD-10-CM | POA: Diagnosis not present

## 2018-11-28 DIAGNOSIS — I739 Peripheral vascular disease, unspecified: Secondary | ICD-10-CM | POA: Diagnosis not present

## 2018-11-28 DIAGNOSIS — E7800 Pure hypercholesterolemia, unspecified: Secondary | ICD-10-CM | POA: Diagnosis not present

## 2018-11-28 DIAGNOSIS — Z9181 History of falling: Secondary | ICD-10-CM | POA: Diagnosis not present

## 2018-11-28 DIAGNOSIS — R69 Illness, unspecified: Secondary | ICD-10-CM | POA: Diagnosis not present

## 2018-11-30 DIAGNOSIS — Z87891 Personal history of nicotine dependence: Secondary | ICD-10-CM | POA: Diagnosis not present

## 2018-11-30 DIAGNOSIS — I10 Essential (primary) hypertension: Secondary | ICD-10-CM | POA: Diagnosis not present

## 2018-11-30 DIAGNOSIS — Z9181 History of falling: Secondary | ICD-10-CM | POA: Diagnosis not present

## 2018-11-30 DIAGNOSIS — R69 Illness, unspecified: Secondary | ICD-10-CM | POA: Diagnosis not present

## 2018-11-30 DIAGNOSIS — R32 Unspecified urinary incontinence: Secondary | ICD-10-CM | POA: Diagnosis not present

## 2018-11-30 DIAGNOSIS — I739 Peripheral vascular disease, unspecified: Secondary | ICD-10-CM | POA: Diagnosis not present

## 2018-11-30 DIAGNOSIS — E7800 Pure hypercholesterolemia, unspecified: Secondary | ICD-10-CM | POA: Diagnosis not present

## 2018-12-01 DIAGNOSIS — R69 Illness, unspecified: Secondary | ICD-10-CM | POA: Diagnosis not present

## 2018-12-07 DIAGNOSIS — Z87891 Personal history of nicotine dependence: Secondary | ICD-10-CM | POA: Diagnosis not present

## 2018-12-07 DIAGNOSIS — Z9181 History of falling: Secondary | ICD-10-CM | POA: Diagnosis not present

## 2018-12-07 DIAGNOSIS — R32 Unspecified urinary incontinence: Secondary | ICD-10-CM | POA: Diagnosis not present

## 2018-12-07 DIAGNOSIS — E7800 Pure hypercholesterolemia, unspecified: Secondary | ICD-10-CM | POA: Diagnosis not present

## 2018-12-07 DIAGNOSIS — I739 Peripheral vascular disease, unspecified: Secondary | ICD-10-CM | POA: Diagnosis not present

## 2018-12-07 DIAGNOSIS — R69 Illness, unspecified: Secondary | ICD-10-CM | POA: Diagnosis not present

## 2018-12-07 DIAGNOSIS — I10 Essential (primary) hypertension: Secondary | ICD-10-CM | POA: Diagnosis not present

## 2018-12-12 DIAGNOSIS — Z9181 History of falling: Secondary | ICD-10-CM | POA: Diagnosis not present

## 2018-12-12 DIAGNOSIS — I739 Peripheral vascular disease, unspecified: Secondary | ICD-10-CM | POA: Diagnosis not present

## 2018-12-12 DIAGNOSIS — R69 Illness, unspecified: Secondary | ICD-10-CM | POA: Diagnosis not present

## 2018-12-12 DIAGNOSIS — E7800 Pure hypercholesterolemia, unspecified: Secondary | ICD-10-CM | POA: Diagnosis not present

## 2018-12-12 DIAGNOSIS — Z87891 Personal history of nicotine dependence: Secondary | ICD-10-CM | POA: Diagnosis not present

## 2018-12-12 DIAGNOSIS — I10 Essential (primary) hypertension: Secondary | ICD-10-CM | POA: Diagnosis not present

## 2018-12-12 DIAGNOSIS — R32 Unspecified urinary incontinence: Secondary | ICD-10-CM | POA: Diagnosis not present

## 2018-12-14 DIAGNOSIS — I739 Peripheral vascular disease, unspecified: Secondary | ICD-10-CM | POA: Diagnosis not present

## 2018-12-14 DIAGNOSIS — E7800 Pure hypercholesterolemia, unspecified: Secondary | ICD-10-CM | POA: Diagnosis not present

## 2018-12-14 DIAGNOSIS — Z87891 Personal history of nicotine dependence: Secondary | ICD-10-CM | POA: Diagnosis not present

## 2018-12-14 DIAGNOSIS — R32 Unspecified urinary incontinence: Secondary | ICD-10-CM | POA: Diagnosis not present

## 2018-12-14 DIAGNOSIS — Z9181 History of falling: Secondary | ICD-10-CM | POA: Diagnosis not present

## 2018-12-14 DIAGNOSIS — R69 Illness, unspecified: Secondary | ICD-10-CM | POA: Diagnosis not present

## 2018-12-14 DIAGNOSIS — I10 Essential (primary) hypertension: Secondary | ICD-10-CM | POA: Diagnosis not present

## 2018-12-19 DIAGNOSIS — E7800 Pure hypercholesterolemia, unspecified: Secondary | ICD-10-CM | POA: Diagnosis not present

## 2018-12-19 DIAGNOSIS — R32 Unspecified urinary incontinence: Secondary | ICD-10-CM | POA: Diagnosis not present

## 2018-12-19 DIAGNOSIS — Z87891 Personal history of nicotine dependence: Secondary | ICD-10-CM | POA: Diagnosis not present

## 2018-12-19 DIAGNOSIS — Z9181 History of falling: Secondary | ICD-10-CM | POA: Diagnosis not present

## 2018-12-19 DIAGNOSIS — I10 Essential (primary) hypertension: Secondary | ICD-10-CM | POA: Diagnosis not present

## 2018-12-19 DIAGNOSIS — I739 Peripheral vascular disease, unspecified: Secondary | ICD-10-CM | POA: Diagnosis not present

## 2018-12-19 DIAGNOSIS — R69 Illness, unspecified: Secondary | ICD-10-CM | POA: Diagnosis not present

## 2019-01-13 ENCOUNTER — Other Ambulatory Visit: Payer: Self-pay

## 2019-01-13 ENCOUNTER — Emergency Department (HOSPITAL_COMMUNITY)
Admission: EM | Admit: 2019-01-13 | Discharge: 2019-01-14 | Disposition: A | Discharge status: Home / Self Care | Payer: Medicare HMO | Attending: Emergency Medicine | Admitting: Emergency Medicine

## 2019-01-13 ENCOUNTER — Encounter (HOSPITAL_COMMUNITY): Payer: Self-pay

## 2019-01-13 DIAGNOSIS — M545 Low back pain, unspecified: Secondary | ICD-10-CM

## 2019-01-13 DIAGNOSIS — Z7982 Long term (current) use of aspirin: Secondary | ICD-10-CM | POA: Diagnosis not present

## 2019-01-13 DIAGNOSIS — Z8673 Personal history of transient ischemic attack (TIA), and cerebral infarction without residual deficits: Secondary | ICD-10-CM | POA: Diagnosis not present

## 2019-01-13 DIAGNOSIS — Z87891 Personal history of nicotine dependence: Secondary | ICD-10-CM | POA: Diagnosis not present

## 2019-01-13 DIAGNOSIS — M549 Dorsalgia, unspecified: Secondary | ICD-10-CM | POA: Diagnosis present

## 2019-01-13 DIAGNOSIS — R109 Unspecified abdominal pain: Secondary | ICD-10-CM | POA: Diagnosis not present

## 2019-01-13 DIAGNOSIS — R079 Chest pain, unspecified: Secondary | ICD-10-CM | POA: Diagnosis not present

## 2019-01-13 DIAGNOSIS — K8020 Calculus of gallbladder without cholecystitis without obstruction: Secondary | ICD-10-CM | POA: Diagnosis not present

## 2019-01-13 DIAGNOSIS — R296 Repeated falls: Secondary | ICD-10-CM | POA: Diagnosis not present

## 2019-01-13 DIAGNOSIS — Z79899 Other long term (current) drug therapy: Secondary | ICD-10-CM | POA: Insufficient documentation

## 2019-01-13 DIAGNOSIS — I10 Essential (primary) hypertension: Secondary | ICD-10-CM | POA: Diagnosis not present

## 2019-01-13 LAB — COMPREHENSIVE METABOLIC PANEL
ALT: 7 U/L (ref 0–44)
AST: 20 U/L (ref 15–41)
Albumin: 4.6 g/dL (ref 3.5–5.0)
Alkaline Phosphatase: 41 U/L (ref 38–126)
Anion gap: 11 (ref 5–15)
BUN: 12 mg/dL (ref 8–23)
CO2: 24 mmol/L (ref 22–32)
Calcium: 9.6 mg/dL (ref 8.9–10.3)
Chloride: 103 mmol/L (ref 98–111)
Creatinine, Ser: 0.94 mg/dL (ref 0.44–1.00)
GFR calc Af Amer: >60 mL/min (ref >60)
GFR calc non Af Amer: 59 mL/min — ABNORMAL LOW (ref >60)
Glucose, Bld: 96 mg/dL (ref 70–99)
Potassium: 3.3 mmol/L — ABNORMAL LOW (ref 3.5–5.1)
Sodium: 138 mmol/L (ref 135–145)
Total Bilirubin: 1 mg/dL (ref 0.3–1.2)
Total Protein: 7.3 g/dL (ref 6.5–8.1)

## 2019-01-13 LAB — CBC
HCT: 45.3 % (ref 36.0–46.0)
Hemoglobin: 13.6 g/dL (ref 12.0–15.0)
MCH: 26.3 pg (ref 26.0–34.0)
MCHC: 30 g/dL (ref 30.0–36.0)
MCV: 87.5 fL (ref 80.0–100.0)
Platelets: 232 10*3/uL (ref 150–400)
RBC: 5.18 MIL/uL — ABNORMAL HIGH (ref 3.87–5.11)
RDW: 13.8 % (ref 11.5–15.5)
WBC: 4.5 10*3/uL (ref 4.0–10.5)
nRBC: 0 % (ref 0.0–0.2)

## 2019-01-13 LAB — LIPASE, BLOOD: Lipase: 19 U/L (ref 11–51)

## 2019-01-13 MED ORDER — SODIUM CHLORIDE 0.9% FLUSH
3.0000 mL | Freq: Once | INTRAVENOUS | Status: AC
  Administered 2019-01-14: 3 mL via INTRAVENOUS

## 2019-01-13 NOTE — ED Triage Notes (Signed)
Pt reports severe left lower back pain for the past 4 days, worse tonight, pt unable to sit down due to the pain, tearful in triage.

## 2019-01-14 ENCOUNTER — Emergency Department (HOSPITAL_COMMUNITY): Payer: Medicare HMO

## 2019-01-14 DIAGNOSIS — R079 Chest pain, unspecified: Secondary | ICD-10-CM | POA: Diagnosis not present

## 2019-01-14 DIAGNOSIS — R296 Repeated falls: Secondary | ICD-10-CM | POA: Diagnosis not present

## 2019-01-14 DIAGNOSIS — K8020 Calculus of gallbladder without cholecystitis without obstruction: Secondary | ICD-10-CM | POA: Diagnosis not present

## 2019-01-14 LAB — URINALYSIS, ROUTINE W REFLEX MICROSCOPIC
Bilirubin Urine: NEGATIVE
Glucose, UA: 50 mg/dL — AB
Hgb urine dipstick: NEGATIVE
Ketones, ur: 5 mg/dL — AB
Leukocytes,Ua: NEGATIVE
Nitrite: NEGATIVE
Protein, ur: NEGATIVE mg/dL
Specific Gravity, Urine: >1.046 — ABNORMAL HIGH (ref 1.005–1.030)
pH: 7 (ref 5.0–8.0)

## 2019-01-14 MED ORDER — CYCLOBENZAPRINE HCL 10 MG PO TABS: 10.0000 mg | ORAL_TABLET | Freq: Two times a day (BID) | ORAL | 0 refills | Status: DC | PRN

## 2019-01-14 MED ORDER — PANTOPRAZOLE SODIUM 20 MG PO TBEC: 20.0000 mg | DELAYED_RELEASE_TABLET | Freq: Every day | ORAL | 0 refills | Status: DC

## 2019-01-14 MED ORDER — MORPHINE SULFATE (PF) 4 MG/ML IV SOLN
4.0000 mg | Freq: Once | INTRAVENOUS | Status: AC
  Administered 2019-01-14: 4 mg via INTRAVENOUS
  Filled 2019-01-14: qty 1

## 2019-01-14 MED ORDER — IOHEXOL 350 MG/ML SOLN
100.0000 mL | Freq: Once | INTRAVENOUS | Status: AC | PRN
  Administered 2019-01-14: 100 mL via INTRAVENOUS

## 2019-01-14 NOTE — ED Provider Notes (Signed)
[facility]   CSN: [id number] Arrival date & time: 01/13/19  2157     History   Chief Complaint Chief Complaint  Patient presents with  . Back Pain    HPI [name] is a 78 y.o. female.     HPI   78yo female with history of htn, hlpd, PVD, CVA, presents with concern for left sided abdominal and back/flank pain. Reports 4 days of symptoms, worse at night.  No n/v. Reports no constipation, has had mild diarrhea.  No urinary symptoms. No loss of control of bowel or bladder, no numbness/weakness.  No fevers.  Pain severe, can't get comfortable, wanting to change positions. No chest pain or dyspnea.   Past Medical History:  Diagnosis Date  . Arthritis   . Depression   . High cholesterol   . Hypertension   . Pneumonia   . Stroke [facility](HCC)    1994 no residuals    Patient Active Problem List   Diagnosis Date Noted  . Status post left rotator cuff repair 02/13/2018  . Complete rotator cuff tear of left shoulder 01/27/2018  . S/P left rotator cuff repair 01/27/2018  . PVD (peripheral vascular disease) with claudication (HCC) 05/24/2012  . Peripheral vascular disease, unspecified (HCC) 11/24/2011  . HYPERCHOLESTEROLEMIA 04/21/2006  . OBESITY, NOS 04/21/2006  . DEPRESSION, MAJOR, RECURRENT 04/21/2006  . TOBACCO DEPENDENCE 04/21/2006  . HYPERTENSION, BENIGN SYSTEMIC 04/21/2006  . HAND PAIN 04/21/2006    Past Surgical History:  Procedure Laterality Date  . ABDOMINAL HYSTERECTOMY    . catheterization of the left superficial femoral artery  10/20/2009  . SHOULDER ARTHROSCOPY WITH ROTATOR CUFF REPAIR AND SUBACROMIAL DECOMPRESSION Left 01/27/2018   Procedure: LEFT SHOULDER ARTHROSCOPY WITH DEBRIDEMENT, SUBACROMIAL DECOMPRESSION AND ROTATOR CUFF REPAIR;  Surgeon: Kathryne HitchBlackman, Christopher Y, MD;  Location: [facility];  Service: Orthopedics;  Laterality: Left;  . SHOULDER SURGERY     Left with dr. Gayla DossBalckman arthroscopy with possible  rotator cuff repair  . TONSILLECTOMY       OB History   No obstetric history on file.      Home Medications    Prior to Admission medications   Medication Sig Start Date End Date Taking? Authorizing Provider  aspirin 81 MG tablet Take 81 mg by mouth daily.    [provider]  Calcium Carbonate-Vitamin D (CALCIUM 600+D HIGH POTENCY PO) Take 1 tablet by mouth daily.     [provider]  Cholecalciferol (VITAMIN D) 2000 UNITS tablet Take 2,000 Units by mouth daily.    [provider]  cyclobenzaprine (FLEXERIL) 10 MG tablet Take 1 tablet (10 mg total) by mouth 2 (two) times daily as needed for muscle spasms. 01/14/19   Alvira MondaySchlossman, Yani Coventry, MD  escitalopram (LEXAPRO) 10 MG tablet 1  qam Patient not taking: Reported on 10/21/2018 04/23/16   Archer AsaPlovsky, Gerald, MD  hydrALAZINE (APRESOLINE) 25 MG tablet Take 25 mg by mouth 3 (three) times daily as needed for anxiety. 10/31/18   [provider]  HYDROcodone-acetaminophen (NORCO/VICODIN) 5-325 MG tablet Take 1-2 tablets by mouth every 4 (four) hours as needed for moderate pain (pain score 4-6). Patient not taking: Reported on 10/21/2018 01/28/18   Kathryne HitchBlackman, Christopher Y, MD  lisinopril (PRINIVIL,ZESTRIL) 40 MG tablet Take 40 mg by mouth daily.    [provider]  pantoprazole (PROTONIX) 20 MG tablet Take 1 tablet (20 mg total) by mouth daily. 01/14/19   Alvira MondaySchlossman, Rowe Warman, MD    Family History Family History  Problem Relation Age of Onset  . Cancer Mother   . Diabetes Mother   . Heart disease Mother   . Hypertension Mother   . Heart attack Mother   . Hyperlipidemia Mother   . Hypertension Father   . Diabetes Sister   . Heart disease Sister   . Hyperlipidemia Sister   . Hypertension Sister   . Cancer Sister   . Diabetes Brother   . Heart disease Brother   . Hypertension Brother   . Cancer Brother   . Hyperlipidemia Brother   . Heart disease Son   . Heart attack Son        amputation    Social  History Social History   Tobacco Use  . Smoking status: Former Smoker    Packs/day: 0.50    Types: Cigarettes    Quit date: 12/17/2017    Years since quitting: 1.0  . Smokeless tobacco: Never Used  . Tobacco comment: using nic patches  Substance Use Topics  . Alcohol use: Not Currently  . Drug use: No     Allergies   Penicillins, Shellfish allergy, and Zoloft [sertraline]   Review of Systems Review of Systems  Constitutional: Negative for fever.  HENT: Negative for sore throat.   Eyes: Negative for visual disturbance.  Respiratory: Negative for cough and shortness of breath.   Cardiovascular: Negative for chest pain.  Gastrointestinal: Positive for abdominal pain. Negative for diarrhea, nausea and vomiting.  Genitourinary: Positive for flank pain. Negative for difficulty urinating and dysuria.  Musculoskeletal: Positive for back pain. Negative for neck pain.  Skin: Negative for rash.  Neurological: Negative for syncope and headaches.     Physical Exam Updated Vital Signs BP (!) 167/88 (BP Location: Right Arm)   Pulse (!) 55   Temp 98.4 F (36.9 C) (Oral)   Resp 11   SpO2 96%   Physical Exam Vitals signs and nursing note reviewed.  Constitutional:      General: She is not in acute distress.    Appearance: She is well-developed. She is not diaphoretic.  HENT:     Head: Normocephalic and atraumatic.  Eyes:     Conjunctiva/sclera: Conjunctivae normal.  Neck:     Musculoskeletal: Normal range of motion.  Cardiovascular:     Rate and Rhythm: Normal rate and regular rhythm.     Pulses: Normal pulses.  Pulmonary:     Effort: Pulmonary effort is normal. No respiratory distress.  Abdominal:     General: There is no distension.     Palpations: Abdomen is soft.     Tenderness: There is abdominal tenderness (LUQ, left flank). There is no guarding.  Musculoskeletal:        General: Tenderness (LLE back) present.  Skin:    General: Skin is warm and dry.      Findings: No erythema or rash.  Neurological:     Mental Status: She is alert and oriented to person, place, and time.      ED Treatments / Results  Labs (all labs ordered are listed, but only abnormal results are displayed) Labs Reviewed  COMPREHENSIVE METABOLIC PANEL - Abnormal; Notable for the following components:      Result Value   Potassium 3.3 (*)    GFR calc non Af Amer 59 (*)    All other components within normal limits  CBC - Abnormal; Notable for the following components:   RBC 5.18 (*)    All other components within normal limits  URINALYSIS, ROUTINE  W REFLEX MICROSCOPIC - Abnormal; Notable for the following components:   Specific Gravity, Urine >1.046 (*)    Glucose, UA 50 (*)    Ketones, ur 5 (*)    All other components within normal limits  URINE CULTURE  LIPASE, BLOOD    EKG None  Radiology Ct Angio Chest/abd/pel For Dissection W And/or Wo Contrast  Result Date: 01/14/2019 CLINICAL DATA:  Chest and back pain. Aortic dissection suspected. Dementia. Recent falls. EXAM: CT ANGIOGRAPHY CHEST, ABDOMEN AND PELVIS TECHNIQUE: Multidetector CT imaging through the chest, abdomen and pelvis was performed using the standard protocol during bolus administration of intravenous contrast. Multiplanar reconstructed images and MIPs were obtained and reviewed to evaluate the vascular anatomy. CONTRAST:  OMNIPAQUE IOHEXOL 350 MG/ML SOLN COMPARISON:  Chest radiograph 12/09/2008. FINDINGS: CTA CHEST FINDINGS Cardiovascular: Aortic and branch vessel atherosclerosis. Normal aortic caliber, without evidence of dissection. Tortuous thoracic aorta. Mild cardiomegaly, without pericardial effusion. Multivessel coronary artery atherosclerosis. Pulmonary artery enlargement, outflow tract 3.7 cm. No central pulmonary embolism, on this non-dedicated study. Mediastinum/Nodes: No mediastinal or hilar adenopathy. Lungs/Pleura: No pleural fluid. Centrilobular emphysema. Musculoskeletal: Thoracic  spondylosis. Review of the MIP images confirms the above findings. CTA ABDOMEN AND PELVIS FINDINGS VASCULAR Aorta: Atherosclerotic, without aneurysm or dissection. Celiac: Originates from the SMA (conjoined celiac and SMA). No significant stenosis. SMA: Originates with the celiac.  No significant stenosis. Renals: Atherosclerotic irregularity at the origin of the right renal artery, without significant stenosis. IMA: Patent Inflow: Extensive atherosclerotic irregularity within the pelvic vasculature. There is significant stenosis involving the left femoral artery including on 60/10. Poststenotic dilatation just distal to this area is mild. Left femoral artery is occluded just distal to this, with supply by deep and lateral branches. Example 183/7. Veins: Not well evaluated. Review of the MIP images confirms the above findings. NON-VASCULAR Hepatobiliary: Degradation in the upper abdomen secondary to arm position, not raised above the head. Normal liver. Gallstones on the order of 4-5 mm. The common duct measures up to 11 mm on 92/7. Pancreas: Normal, without mass or ductal dilatation. Spleen: Grossly normal spleen, given above limitations. Adrenals/Urinary Tract: Normal adrenal glands. Mild renal cortical thinning bilaterally. Upper pole left renal lesion of 1.5 cm demonstrates complexity on 92/7. Other the bilateral renal lesions are too small to characterize. No hydronephrosis. Normal urinary bladder. Stomach/Bowel: The stomach is underdistended and suboptimally evaluated. Colonic stool burden suggests constipation. Normal small bowel caliber. No pneumatosis or free intraperitoneal air. Appendix not visualized. Lymphatic: No abdominopelvic adenopathy. Reproductive: Hysterectomy.  No adnexal mass. Other: No significant free fluid.  Mild pelvic floor laxity. Musculoskeletal: No acute osseous abnormality. Review of the MIP images confirms the above findings. IMPRESSION: 1. Degraded exam, primarily of the upper  abdomen, secondary to patient arm position. 2. No evidence of aortic aneurysm or dissection. 3. Cholelithiasis with biliary duct dilatation. Correlate with right upper quadrant symptoms and bilirubin. Consider right upper quadrant ultrasound. 4. Left renal indeterminate lesion. Consider nonemergent outpatient pre and post contrast CT or MRI. 5. Coronary artery atherosclerosis. Aortic Atherosclerosis (ICD10-I70.0). Emphysema (ICD10-J43.9). 6. Pulmonary artery enlargement suggests pulmonary arterial hypertension. 7. Stenosis of the left femoral artery proximally with distal occlusion and supply the left lower extremity via lateral and profundus femoral branches. 8.  Possible constipation. Electronically Signed   By: Jeronimo Greaves M.D.   On: 01/14/2019 10:10    Procedures Procedures (including critical care time)  Medications Ordered in ED Medications  sodium chloride flush (NS) 0.9 % injection 3 mL (3  mLs Intravenous Given 01/14/19 0900)  morphine 4 MG/ML injection 4 mg (4 mg Intravenous Given 01/14/19 0902)  iohexol (OMNIPAQUE) 350 MG/ML injection 100 mL (100 mLs Intravenous Contrast Given 01/14/19 0907)     Initial Impression / Assessment and Plan / ED Course  I have reviewed the triage vital signs and the nursing notes.  Pertinent labs & imaging results that were available during my care of the patient were reviewed by me and considered in my medical decision making (see chart for details).        78yo female with history of htn, hlpd, PVD, CVA, presents with concern for left sided abdominal and back/flank pain. No CP/dyspnea. UA without infection. Labs WNL.  DDx includes dissection, diverticulitis, nephrolithiasis, PUD, MSK back pain.  Dissection study completed showing no sign of dissection or AAA, no report of nephrolithiasis, diverticulitis.  Cholelithiasis and bile duct dilatino present however no RUQ tenderness or pain and doubt symptoms secondary to symptomatic cholecdocolithiasis.  Stenosis of left femoral artery present, no leg symptoms or pain, normal pulses. Indeterminate lesion consider nonemergent pre and post contrast CT or MRI.    Suspect possible gastritis or MSK pain, given flexeril, protonix and recommend follow up with PCP.   Final Clinical Impressions(s) / ED Diagnoses   Final diagnoses:  Left sided abdominal pain  Acute left-sided low back pain without sciatica    ED Discharge Orders         Ordered    pantoprazole (PROTONIX) 20 MG tablet  Daily     01/14/19 1151    cyclobenzaprine (FLEXERIL) 10 MG tablet  2 times daily PRN     01/14/19 1151           Alvira Monday, MD 01/14/19 2308

## 2019-01-14 NOTE — ED Notes (Signed)
Patient transported to CT 

## 2019-01-14 NOTE — ED Notes (Signed)
Pt returned from CT °

## 2019-01-14 NOTE — ED Notes (Signed)
Called pt.x3 no answer to update vitalsigns

## 2019-01-15 LAB — URINE CULTURE: Culture: NO GROWTH

## 2019-01-23 ENCOUNTER — Other Ambulatory Visit: Payer: Self-pay | Admitting: Family Medicine

## 2019-01-23 DIAGNOSIS — N2889 Other specified disorders of kidney and ureter: Secondary | ICD-10-CM

## 2019-02-08 DIAGNOSIS — H53462 Homonymous bilateral field defects, left side: Secondary | ICD-10-CM | POA: Diagnosis not present

## 2019-02-08 DIAGNOSIS — H40013 Open angle with borderline findings, low risk, bilateral: Secondary | ICD-10-CM | POA: Diagnosis not present

## 2019-02-13 ENCOUNTER — Ambulatory Visit
Admission: RE | Admit: 2019-02-13 | Discharge: 2019-02-13 | Disposition: A | Discharge status: Home / Self Care | Payer: Medicare HMO | Source: Ambulatory Visit | Attending: Family Medicine | Admitting: Family Medicine

## 2019-02-13 DIAGNOSIS — N2889 Other specified disorders of kidney and ureter: Secondary | ICD-10-CM | POA: Diagnosis not present

## 2019-02-13 MED ORDER — GADOBENATE DIMEGLUMINE 529 MG/ML IV SOLN
18.0000 mL | Freq: Once | INTRAVENOUS | Status: AC | PRN
  Administered 2019-02-13: 18 mL via INTRAVENOUS

## 2019-03-23 ENCOUNTER — Other Ambulatory Visit: Payer: Self-pay | Admitting: Orthopaedic Surgery

## 2019-03-23 ENCOUNTER — Encounter: Payer: Self-pay | Admitting: Orthopaedic Surgery

## 2019-03-30 ENCOUNTER — Telehealth: Payer: Self-pay | Admitting: Orthopaedic Surgery

## 2019-03-30 NOTE — Telephone Encounter (Signed)
LMOM for her to call me back and give more info I'm not sure what she is talking about

## 2019-03-30 NOTE — Telephone Encounter (Signed)
Patient came by.   She was injured in her shoulder area and seen by Dr.Blackman. He lawyer requested paperwork detailing the injury but the date of when she was seen by Dr. Magnus Ivan was incorrect.   She is requesting a new one be printed for her to pick up.   Call back number: [phone]

## 2019-04-18 ENCOUNTER — Telehealth: Payer: Self-pay | Admitting: Orthopaedic Surgery

## 2019-04-18 NOTE — Telephone Encounter (Signed)
Received vm from [place] w/ R. Clyda Hurdle office checking on records request. IC, lm w/ receptionist as Asher Muir was not in. advising we haven't received recent request, to please re fax to [place] [phone]

## 2019-07-10 ENCOUNTER — Telehealth: Payer: Self-pay | Admitting: Orthopaedic Surgery

## 2019-07-10 NOTE — Telephone Encounter (Signed)
Received another call from [place] w/ R Clyda Hurdle office. I advised we still do not have their request. She will email

## 2019-08-16 ENCOUNTER — Other Ambulatory Visit: Payer: Self-pay | Admitting: Internal Medicine

## 2019-08-16 DIAGNOSIS — Z1231 Encounter for screening mammogram for malignant neoplasm of breast: Secondary | ICD-10-CM

## 2019-08-29 ENCOUNTER — Ambulatory Visit
Admission: RE | Admit: 2019-08-29 | Discharge: 2019-08-29 | Disposition: A | Discharge status: Home / Self Care | Payer: Medicare HMO | Source: Ambulatory Visit | Attending: Internal Medicine | Admitting: Internal Medicine

## 2019-08-29 ENCOUNTER — Other Ambulatory Visit: Payer: Self-pay

## 2019-08-29 DIAGNOSIS — Z1231 Encounter for screening mammogram for malignant neoplasm of breast: Secondary | ICD-10-CM | POA: Diagnosis not present

## 2019-09-03 ENCOUNTER — Other Ambulatory Visit: Payer: Self-pay | Admitting: Internal Medicine

## 2019-09-03 DIAGNOSIS — R928 Other abnormal and inconclusive findings on diagnostic imaging of breast: Secondary | ICD-10-CM

## 2019-09-05 ENCOUNTER — Other Ambulatory Visit: Payer: Self-pay | Admitting: Internal Medicine

## 2019-09-05 ENCOUNTER — Other Ambulatory Visit: Payer: Self-pay

## 2019-09-05 ENCOUNTER — Ambulatory Visit
Admission: RE | Admit: 2019-09-05 | Discharge: 2019-09-05 | Disposition: A | Discharge status: Home / Self Care | Payer: Medicare HMO | Source: Ambulatory Visit | Attending: Internal Medicine | Admitting: Internal Medicine

## 2019-09-05 DIAGNOSIS — R921 Mammographic calcification found on diagnostic imaging of breast: Secondary | ICD-10-CM | POA: Diagnosis not present

## 2019-09-05 DIAGNOSIS — R928 Other abnormal and inconclusive findings on diagnostic imaging of breast: Secondary | ICD-10-CM

## 2019-10-02 DIAGNOSIS — I10 Essential (primary) hypertension: Secondary | ICD-10-CM | POA: Diagnosis not present

## 2019-10-02 DIAGNOSIS — R634 Abnormal weight loss: Secondary | ICD-10-CM | POA: Diagnosis not present

## 2019-10-02 DIAGNOSIS — L84 Corns and callosities: Secondary | ICD-10-CM | POA: Diagnosis not present

## 2019-10-08 ENCOUNTER — Inpatient Hospital Stay (HOSPITAL_COMMUNITY): Payer: Medicare HMO

## 2019-10-08 ENCOUNTER — Emergency Department (HOSPITAL_COMMUNITY): Payer: Medicare HMO

## 2019-10-08 ENCOUNTER — Inpatient Hospital Stay (HOSPITAL_COMMUNITY)
Admission: EM | Admit: 2019-10-08 | Discharge: 2019-10-12 | DRG: 065 | Disposition: A | Discharge status: Skilled Nursing Facility | Payer: Medicare HMO | Attending: Internal Medicine | Admitting: Internal Medicine

## 2019-10-08 ENCOUNTER — Encounter (HOSPITAL_COMMUNITY): Payer: Self-pay

## 2019-10-08 DIAGNOSIS — I4891 Unspecified atrial fibrillation: Secondary | ICD-10-CM | POA: Diagnosis not present

## 2019-10-08 DIAGNOSIS — Z20822 Contact with and (suspected) exposure to covid-19: Secondary | ICD-10-CM | POA: Diagnosis present

## 2019-10-08 DIAGNOSIS — E669 Obesity, unspecified: Secondary | ICD-10-CM | POA: Diagnosis present

## 2019-10-08 DIAGNOSIS — E785 Hyperlipidemia, unspecified: Secondary | ICD-10-CM | POA: Diagnosis not present

## 2019-10-08 DIAGNOSIS — R262 Difficulty in walking, not elsewhere classified: Secondary | ICD-10-CM | POA: Diagnosis not present

## 2019-10-08 DIAGNOSIS — Z880 Allergy status to penicillin: Secondary | ICD-10-CM

## 2019-10-08 DIAGNOSIS — I739 Peripheral vascular disease, unspecified: Secondary | ICD-10-CM

## 2019-10-08 DIAGNOSIS — R0902 Hypoxemia: Secondary | ICD-10-CM | POA: Diagnosis not present

## 2019-10-08 DIAGNOSIS — E7800 Pure hypercholesterolemia, unspecified: Secondary | ICD-10-CM | POA: Diagnosis present

## 2019-10-08 DIAGNOSIS — I10 Essential (primary) hypertension: Secondary | ICD-10-CM | POA: Diagnosis not present

## 2019-10-08 DIAGNOSIS — F17200 Nicotine dependence, unspecified, uncomplicated: Secondary | ICD-10-CM | POA: Diagnosis present

## 2019-10-08 DIAGNOSIS — R5381 Other malaise: Secondary | ICD-10-CM | POA: Diagnosis not present

## 2019-10-08 DIAGNOSIS — Z823 Family history of stroke: Secondary | ICD-10-CM | POA: Diagnosis not present

## 2019-10-08 DIAGNOSIS — I517 Cardiomegaly: Secondary | ICD-10-CM | POA: Diagnosis not present

## 2019-10-08 DIAGNOSIS — R0602 Shortness of breath: Secondary | ICD-10-CM

## 2019-10-08 DIAGNOSIS — M6281 Muscle weakness (generalized): Secondary | ICD-10-CM | POA: Diagnosis not present

## 2019-10-08 DIAGNOSIS — S0990XA Unspecified injury of head, initial encounter: Secondary | ICD-10-CM | POA: Diagnosis not present

## 2019-10-08 DIAGNOSIS — Z888 Allergy status to other drugs, medicaments and biological substances status: Secondary | ICD-10-CM

## 2019-10-08 DIAGNOSIS — I6340 Cerebral infarction due to embolism of unspecified cerebral artery: Secondary | ICD-10-CM | POA: Diagnosis not present

## 2019-10-08 DIAGNOSIS — E860 Dehydration: Secondary | ICD-10-CM | POA: Diagnosis present

## 2019-10-08 DIAGNOSIS — Z79899 Other long term (current) drug therapy: Secondary | ICD-10-CM

## 2019-10-08 DIAGNOSIS — R29707 NIHSS score 7: Secondary | ICD-10-CM | POA: Diagnosis present

## 2019-10-08 DIAGNOSIS — Z8249 Family history of ischemic heart disease and other diseases of the circulatory system: Secondary | ICD-10-CM | POA: Diagnosis not present

## 2019-10-08 DIAGNOSIS — R531 Weakness: Secondary | ICD-10-CM

## 2019-10-08 DIAGNOSIS — K219 Gastro-esophageal reflux disease without esophagitis: Secondary | ICD-10-CM | POA: Diagnosis present

## 2019-10-08 DIAGNOSIS — R29810 Facial weakness: Secondary | ICD-10-CM | POA: Diagnosis not present

## 2019-10-08 DIAGNOSIS — I7389 Other specified peripheral vascular diseases: Secondary | ICD-10-CM | POA: Diagnosis present

## 2019-10-08 DIAGNOSIS — F329 Major depressive disorder, single episode, unspecified: Secondary | ICD-10-CM | POA: Diagnosis present

## 2019-10-08 DIAGNOSIS — I63231 Cerebral infarction due to unspecified occlusion or stenosis of right carotid arteries: Secondary | ICD-10-CM | POA: Diagnosis not present

## 2019-10-08 DIAGNOSIS — I639 Cerebral infarction, unspecified: Secondary | ICD-10-CM | POA: Diagnosis present

## 2019-10-08 DIAGNOSIS — F339 Major depressive disorder, recurrent, unspecified: Secondary | ICD-10-CM | POA: Diagnosis not present

## 2019-10-08 DIAGNOSIS — I6389 Other cerebral infarction: Secondary | ICD-10-CM | POA: Diagnosis not present

## 2019-10-08 DIAGNOSIS — I69354 Hemiplegia and hemiparesis following cerebral infarction affecting left non-dominant side: Secondary | ICD-10-CM | POA: Diagnosis not present

## 2019-10-08 DIAGNOSIS — Z87891 Personal history of nicotine dependence: Secondary | ICD-10-CM | POA: Diagnosis not present

## 2019-10-08 DIAGNOSIS — Z7982 Long term (current) use of aspirin: Secondary | ICD-10-CM | POA: Diagnosis not present

## 2019-10-08 DIAGNOSIS — W19XXXA Unspecified fall, initial encounter: Secondary | ICD-10-CM | POA: Diagnosis present

## 2019-10-08 DIAGNOSIS — M2550 Pain in unspecified joint: Secondary | ICD-10-CM | POA: Diagnosis not present

## 2019-10-08 DIAGNOSIS — Z6828 Body mass index (BMI) 28.0-28.9, adult: Secondary | ICD-10-CM | POA: Diagnosis not present

## 2019-10-08 DIAGNOSIS — I161 Hypertensive emergency: Secondary | ICD-10-CM | POA: Diagnosis present

## 2019-10-08 DIAGNOSIS — R21 Rash and other nonspecific skin eruption: Secondary | ICD-10-CM | POA: Diagnosis not present

## 2019-10-08 DIAGNOSIS — I69811 Memory deficit following other cerebrovascular disease: Secondary | ICD-10-CM | POA: Diagnosis not present

## 2019-10-08 DIAGNOSIS — E876 Hypokalemia: Secondary | ICD-10-CM | POA: Diagnosis present

## 2019-10-08 DIAGNOSIS — Z7401 Bed confinement status: Secondary | ICD-10-CM | POA: Diagnosis not present

## 2019-10-08 DIAGNOSIS — Z91013 Allergy to seafood: Secondary | ICD-10-CM | POA: Diagnosis not present

## 2019-10-08 LAB — CBC
HCT: 40.6 % (ref 36.0–46.0)
HCT: 34.6 % — ABNORMAL LOW (ref 36.0–46.0)
Hemoglobin: 12.2 g/dL (ref 12.0–15.0)
Hemoglobin: 10.5 g/dL — ABNORMAL LOW (ref 12.0–15.0)
MCH: 26.2 pg (ref 26.0–34.0)
MCH: 26.6 pg (ref 26.0–34.0)
MCHC: 30 g/dL (ref 30.0–36.0)
MCHC: 30.3 g/dL (ref 30.0–36.0)
MCV: 87.1 fL (ref 80.0–100.0)
MCV: 87.6 fL (ref 80.0–100.0)
Platelets: 226 10*3/uL (ref 150–400)
Platelets: 176 10*3/uL (ref 150–400)
RBC: 4.66 MIL/uL (ref 3.87–5.11)
RBC: 3.95 MIL/uL (ref 3.87–5.11)
RDW: 14.6 % (ref 11.5–15.5)
RDW: 14.9 % (ref 11.5–15.5)
WBC: 4 10*3/uL (ref 4.0–10.5)
WBC: 3.6 10*3/uL — ABNORMAL LOW (ref 4.0–10.5)
nRBC: 0 % (ref 0.0–0.2)
nRBC: 0 % (ref 0.0–0.2)

## 2019-10-08 LAB — DIFFERENTIAL
Abs Immature Granulocytes: 0.01 10*3/uL (ref 0.00–0.07)
Basophils Absolute: 0.1 10*3/uL (ref 0.0–0.1)
Basophils Relative: 2 %
Eosinophils Absolute: 0 10*3/uL (ref 0.0–0.5)
Eosinophils Relative: 1 %
Immature Granulocytes: 0 %
Lymphocytes Relative: 35 %
Lymphs Abs: 1.4 10*3/uL (ref 0.7–4.0)
Monocytes Absolute: 0.5 10*3/uL (ref 0.1–1.0)
Monocytes Relative: 13 %
Neutro Abs: 2 10*3/uL (ref 1.7–7.7)
Neutrophils Relative %: 49 %

## 2019-10-08 LAB — CREATININE, SERUM
Creatinine, Ser: 0.8 mg/dL (ref 0.44–1.00)
GFR calc Af Amer: >60 mL/min (ref >60)
GFR calc non Af Amer: >60 mL/min (ref >60)

## 2019-10-08 LAB — PROTIME-INR
INR: 1.1 (ref 0.8–1.2)
Prothrombin Time: 13.4 seconds (ref 11.4–15.2)

## 2019-10-08 LAB — CK: Total CK: 278 U/L — ABNORMAL HIGH (ref 38–234)

## 2019-10-08 LAB — SARS CORONAVIRUS 2 BY RT PCR (HOSPITAL ORDER, PERFORMED IN ~~LOC~~ HOSPITAL LAB): SARS Coronavirus 2: NEGATIVE

## 2019-10-08 LAB — COMPREHENSIVE METABOLIC PANEL
ALT: 8 U/L (ref 0–44)
AST: 17 U/L (ref 15–41)
Albumin: 3.8 g/dL (ref 3.5–5.0)
Alkaline Phosphatase: 43 U/L (ref 38–126)
Anion gap: 10 (ref 5–15)
BUN: 14 mg/dL (ref 8–23)
CO2: 25 mmol/L (ref 22–32)
Calcium: 9.4 mg/dL (ref 8.9–10.3)
Chloride: 104 mmol/L (ref 98–111)
Creatinine, Ser: 1.05 mg/dL — ABNORMAL HIGH (ref 0.44–1.00)
GFR calc Af Amer: 59 mL/min — ABNORMAL LOW (ref >60)
GFR calc non Af Amer: 51 mL/min — ABNORMAL LOW (ref >60)
Glucose, Bld: 117 mg/dL — ABNORMAL HIGH (ref 70–99)
Potassium: 3.4 mmol/L — ABNORMAL LOW (ref 3.5–5.1)
Sodium: 139 mmol/L (ref 135–145)
Total Bilirubin: 1 mg/dL (ref 0.3–1.2)
Total Protein: 6.8 g/dL (ref 6.5–8.1)

## 2019-10-08 LAB — APTT: aPTT: 32 seconds (ref 24–36)

## 2019-10-08 MED ORDER — PANTOPRAZOLE SODIUM 20 MG PO TBEC
20.0000 mg | DELAYED_RELEASE_TABLET | Freq: Every day | ORAL | Status: DC
  Administered 2019-10-09 – 2019-10-12 (×4): 20 mg via ORAL
  Filled 2019-10-08 (×4): qty 1

## 2019-10-08 MED ORDER — STROKE: EARLY STAGES OF RECOVERY BOOK
Freq: Once | Status: AC
  Filled 2019-10-08: qty 1

## 2019-10-08 MED ORDER — ACETAMINOPHEN 160 MG/5ML PO SOLN: 650.0000 mg | ORAL | Status: DC | PRN

## 2019-10-08 MED ORDER — ACETAMINOPHEN 650 MG RE SUPP: 650.0000 mg | RECTAL | Status: DC | PRN

## 2019-10-08 MED ORDER — HYDRALAZINE HCL 20 MG/ML IJ SOLN
10.0000 mg | Freq: Four times a day (QID) | INTRAMUSCULAR | Status: DC | PRN
  Administered 2019-10-08 – 2019-10-09 (×2): 10 mg via INTRAVENOUS
  Filled 2019-10-08 (×2): qty 1

## 2019-10-08 MED ORDER — ASPIRIN 300 MG RE SUPP
300.0000 mg | Freq: Every day | RECTAL | Status: DC
  Filled 2019-10-08: qty 1

## 2019-10-08 MED ORDER — POTASSIUM CHLORIDE 10 MEQ/100ML IV SOLN
10.0000 meq | INTRAVENOUS | Status: AC
  Administered 2019-10-08 (×2): 10 meq via INTRAVENOUS
  Filled 2019-10-08 (×2): qty 100

## 2019-10-08 MED ORDER — HEPARIN SODIUM (PORCINE) 5000 UNIT/ML IJ SOLN
5000.0000 [IU] | Freq: Three times a day (TID) | INTRAMUSCULAR | Status: DC
  Administered 2019-10-08 – 2019-10-12 (×11): 5000 [IU] via SUBCUTANEOUS
  Filled 2019-10-08 (×11): qty 1

## 2019-10-08 MED ORDER — ACETAMINOPHEN 325 MG PO TABS: 650.0000 mg | ORAL_TABLET | ORAL | Status: DC | PRN

## 2019-10-08 MED ORDER — ASPIRIN 325 MG PO TABS
325.0000 mg | ORAL_TABLET | Freq: Every day | ORAL | Status: DC
  Administered 2019-10-08 – 2019-10-10 (×3): 325 mg via ORAL
  Filled 2019-10-08 (×3): qty 1

## 2019-10-08 MED ORDER — SODIUM CHLORIDE 0.9% FLUSH: 3.0000 mL | Freq: Once | INTRAVENOUS | Status: DC

## 2019-10-08 MED ORDER — ATORVASTATIN CALCIUM 40 MG PO TABS
40.0000 mg | ORAL_TABLET | Freq: Every day | ORAL | Status: DC
  Administered 2019-10-08 – 2019-10-12 (×5): 40 mg via ORAL
  Filled 2019-10-08: qty 1
  Filled 2019-10-08: qty 4
  Filled 2019-10-08 (×3): qty 1

## 2019-10-08 MED ORDER — SODIUM CHLORIDE 0.9 % IV SOLN: INTRAVENOUS | Status: AC

## 2019-10-08 MED ORDER — ONDANSETRON HCL 4 MG/2ML IJ SOLN: 4.0000 mg | Freq: Four times a day (QID) | INTRAMUSCULAR | Status: DC | PRN

## 2019-10-08 MED ORDER — SENNOSIDES-DOCUSATE SODIUM 8.6-50 MG PO TABS: 1.0000 | ORAL_TABLET | Freq: Every evening | ORAL | Status: DC | PRN

## 2019-10-08 MED ORDER — ESCITALOPRAM OXALATE 10 MG PO TABS
10.0000 mg | ORAL_TABLET | Freq: Every day | ORAL | Status: DC
  Administered 2019-10-09 – 2019-10-11 (×3): 10 mg via ORAL
  Filled 2019-10-08 (×3): qty 1

## 2019-10-08 NOTE — ED Provider Notes (Signed)
EMERGENCY DEPARTMENT Provider Note  CSN: [id number] Arrival date & time: 10/08/19 1536    History Chief Complaint  Patient presents with  . Stroke Symptoms    HPI  [name] is a 79 y.o. female with history of HTN and HLD presents to the ED for evaluation of stroke like symptoms. Patient reports she was hanging some laundry on the line in her back yard Thursday of last week (4 days ago), when she lost her balance and rolled down a small hill. She was on the ground for some time, noted that her L arm and leg were weak. She eventually was able to get back in her house and has attempted to continue with her ADLs, including driving, with weakness in her L arm and leg, facial droop and difficulty keeping food/water in her mouth.   Past Medical History:  Diagnosis Date  . Arthritis   . Depression   . High cholesterol   . Hypertension   . Pneumonia   . Stroke [facility](HCC)    1994 no residuals    Past Surgical History:  Procedure Laterality Date  . ABDOMINAL HYSTERECTOMY    . catheterization of the left superficial femoral artery  10/20/2009  . SHOULDER ARTHROSCOPY WITH ROTATOR CUFF REPAIR AND SUBACROMIAL DECOMPRESSION Left 01/27/2018   Procedure: LEFT SHOULDER ARTHROSCOPY WITH DEBRIDEMENT, SUBACROMIAL DECOMPRESSION AND ROTATOR CUFF REPAIR;  Surgeon: Kathryne HitchBlackman, Christopher Y, MD;  Location: [facility];  Service: Orthopedics;  Laterality: Left;  . SHOULDER SURGERY     Left with dr. Gayla DossBalckman arthroscopy with possible rotator cuff repair  . TONSILLECTOMY      Family History  Problem Relation Age of Onset  . Cancer Mother   . Diabetes Mother   . Heart disease Mother   . Hypertension Mother   . Heart attack Mother   . Hyperlipidemia Mother   . Hypertension Father   . Diabetes Sister   . Heart disease Sister   . Hyperlipidemia Sister   . Hypertension Sister   . Cancer Sister   . Diabetes Brother   . Heart disease Brother   . Hypertension Brother   . Cancer Brother   .  Hyperlipidemia Brother   . Heart disease Son   . Heart attack Son        amputation    Social History   Tobacco Use  . Smoking status: Former Smoker    Packs/day: 0.50    Types: Cigarettes    Quit date: 12/17/2017    Years since quitting: 1.8  . Smokeless tobacco: Never Used  . Tobacco comment: using nic patches  Vaping Use  . Vaping Use: Never used  Substance Use Topics  . Alcohol use: Not Currently  . Drug use: No     Home Medications Prior to Admission medications   Medication Sig Start Date End Date Taking? Authorizing Provider  albuterol (VENTOLIN HFA) 108 (90 Base) MCG/ACT inhaler  09/15/19   [provider]  amLODipine (NORVASC) 10 MG tablet Take 10 mg by mouth daily. 08/19/19   [provider]  aspirin 81 MG tablet Take 81 mg by mouth daily.    [provider]  atorvastatin (LIPITOR) 10 MG tablet Take 10 mg by mouth daily. 09/13/19   [provider]  buPROPion (WELLBUTRIN XL) 150 MG 24 hr tablet Take 150 mg by mouth every morning. 09/12/19   [provider]  Calcium Carbonate-Vitamin D (CALCIUM 600+D HIGH POTENCY PO) Take 1 tablet by mouth daily.  [provider]  Cholecalciferol (VITAMIN D) 2000 UNITS tablet Take 2,000 Units by mouth daily.    [provider]  cyclobenzaprine (FLEXERIL) 10 MG tablet Take 1 tablet (10 mg total) by mouth 2 (two) times daily as needed for muscle spasms. 01/14/19   Alvira Monday, MD  escitalopram (LEXAPRO) 10 MG tablet 1  qam Patient not taking: Reported on 10/21/2018 04/23/16   Archer Asa, MD  ezetimibe (ZETIA) 10 MG tablet Take 10 mg by mouth daily. 09/13/19   [provider]  hydrALAZINE (APRESOLINE) 25 MG tablet Take 25 mg by mouth 3 (three) times daily as needed for anxiety. 10/31/18   [provider]  hydrochlorothiazide (HYDRODIURIL) 25 MG tablet Take 25 mg by mouth daily. 09/13/19   [provider]  HYDROcodone-acetaminophen (NORCO/VICODIN)  5-325 MG tablet Take 1-2 tablets by mouth every 4 (four) hours as needed for moderate pain (pain score 4-6). Patient not taking: Reported on 10/21/2018 01/28/18   Kathryne Hitch, MD  lisinopril (PRINIVIL,ZESTRIL) 40 MG tablet Take 40 mg by mouth daily.    [provider]  MYRBETRIQ 25 MG TB24 tablet Take 25 mg by mouth daily. 08/21/19   [provider]  pantoprazole (PROTONIX) 20 MG tablet Take 1 tablet (20 mg total) by mouth daily. 01/14/19   Alvira Monday, MD     Allergies    Penicillins, Shellfish allergy, and Zoloft [sertraline]   Review of Systems   Review of Systems A comprehensive review of systems was completed and negative except as noted in HPI.    Physical Exam BP (!) 160/97   Pulse 82   Temp 98.7 F (37.1 C) (Oral)   Resp (!) 21   SpO2 98%   Physical Exam Vitals and nursing note reviewed.  Constitutional:      Appearance: Normal appearance.  HENT:     Head: Normocephalic and atraumatic.     Nose: Nose normal.     Mouth/Throat:     Mouth: Mucous membranes are moist.  Eyes:     Extraocular Movements: Extraocular movements intact.     Conjunctiva/sclera: Conjunctivae normal.  Cardiovascular:     Rate and Rhythm: Normal rate.  Pulmonary:     Effort: Pulmonary effort is normal.     Breath sounds: Normal breath sounds.  Abdominal:     General: Abdomen is flat.     Palpations: Abdomen is soft.     Tenderness: There is no abdominal tenderness.  Musculoskeletal:        General: No swelling. Normal range of motion.     Cervical back: Neck supple.  Skin:    General: Skin is warm and dry.  Neurological:     Mental Status: She is alert and oriented to person, place, and time.     Cranial Nerves: Cranial nerve deficit (R facial droop) present.     Sensory: No sensory deficit.     Motor: Weakness (2/5 strength in LUE and LLE) present.  Psychiatric:        Mood and Affect: Mood normal.      ED Results / Procedures / Treatments    Labs (all labs ordered are listed, but only abnormal results are displayed) Labs Reviewed  COMPREHENSIVE METABOLIC PANEL - Abnormal; Notable for the following components:      Result Value   Potassium 3.4 (*)    Glucose, Bld 117 (*)    Creatinine, Ser 1.05 (*)    GFR calc non Af Amer 51 (*)  GFR calc Af Amer 59 (*)    All other components within normal limits  SARS CORONAVIRUS 2 BY RT PCR (HOSPITAL ORDER, PERFORMED IN [place] [facility])  PROTIME-INR  APTT  CBC  DIFFERENTIAL  HEMOGLOBIN A1C  LIPID PANEL  CBC  CREATININE, SERUM  PROTIME-INR  CBC WITH DIFFERENTIAL/PLATELET  BASIC METABOLIC PANEL  MAGNESIUM  CK    EKG EKG Interpretation  Date/Time:  Monday October 08 2019 15:47:04 EDT Ventricular Rate:  74 PR Interval:  146 QRS Duration: 84 QT Interval:  374 QTC Calculation: 415 R Axis:   43 Text Interpretation: Normal sinus rhythm ST & T wave abnormality, consider inferior ischemia ST & T wave abnormality, consider anterolateral ischemia Abnormal ECG Since last tracing Non-specific ST-t changes Confirmed by Susy Frizzle [phone]) on 10/08/2019 3:54:43 PM   Radiology CT HEAD WO CONTRAST  Result Date: 10/08/2019 CLINICAL DATA:  Larey Seat 4 days ago, left arm weakness, left-sided facial droop EXAM: CT HEAD WITHOUT CONTRAST TECHNIQUE: Contiguous axial images were obtained from the base of the skull through the vertex without intravenous contrast. COMPARISON:  10/21/2018 FINDINGS: Brain: Hypodensities are seen throughout the bilateral basal ganglia and periventricular white matter, grossly stable since prior study and compatible with chronic small vessel ischemic changes. Evidence of prior right occipital infarct. No evidence of acute infarct or hemorrhage. The lateral ventricles and midline structures are otherwise unremarkable. No acute extra-axial fluid collections. No mass effect. Vascular: No hyperdense vessel or unexpected calcification. Skull: Normal. Negative for  fracture or focal lesion. Sinuses/Orbits: Stable polypoid mucosal thickening right maxillary sinus. Remaining sinuses are clear. Other: None. IMPRESSION: 1. Stable chronic ischemic changes as above. No acute intracranial process. Electronically Signed   By: Sharlet Salina M.D.   On: 10/08/2019 17:47   DG Chest Port 1 View  Result Date: 10/08/2019 CLINICAL DATA:  79 year old female with shortness of breath. EXAM: PORTABLE CHEST 1 VIEW COMPARISON:  Chest radiograph dated 12/09/2008 and CT dated 01/14/2019 FINDINGS: The lungs are clear. There is no pleural effusion pneumothorax. Mild cardiomegaly. Atherosclerotic calcification of the aorta. No acute osseous pathology. IMPRESSION: No active disease. Electronically Signed   By: Elgie Collard M.D.   On: 10/08/2019 19:05    Procedures Procedures  Medications Ordered in the ED Medications  sodium chloride flush (NS) 0.9 % injection 3 mL (has no administration in time range)   stroke: mapping our early stages of recovery book (has no administration in time range)  0.9 %  sodium chloride infusion (has no administration in time range)  acetaminophen (TYLENOL) tablet 650 mg (has no administration in time range)    Or  acetaminophen (TYLENOL) 160 MG/5ML solution 650 mg (has no administration in time range)    Or  acetaminophen (TYLENOL) suppository 650 mg (has no administration in time range)  senna-docusate (Senokot-S) tablet 1 tablet (has no administration in time range)  heparin injection 5,000 Units (has no administration in time range)  aspirin suppository 300 mg (has no administration in time range)    Or  aspirin tablet 325 mg (has no administration in time range)  ondansetron (ZOFRAN) injection 4 mg (has no administration in time range)  atorvastatin (LIPITOR) tablet 40 mg (has no administration in time range)  escitalopram (LEXAPRO) tablet 10 mg (has no administration in time range)  pantoprazole (PROTONIX) EC tablet 20 mg (has no  administration in time range)  hydrALAZINE (APRESOLINE) injection 10 mg (has no administration in time range)  potassium chloride 10 mEq in 100 mL IVPB (  has no administration in time range)     MDM Rules/Calculators/A&P MDM Patient with symptoms of a stroke, onset 4 days ago. Not a candidate for tPA or intervention given duration of symptoms. Will check CT, labs and EKG and plan admission.  ED Course  I have reviewed the triage vital signs and the nursing notes.  Pertinent labs & imaging results that were available during my care of the patient were reviewed by me and considered in my medical decision making (see chart for details).  Clinical Course as of Oct 08 1946  [facility] Oct 08, 2019  1731 CBC, Coags and CMP unremarkable. CT head without large hemorrhage. Spoke with Dr. Amada Jupiter, Neurology who will consult. Recommends admission for stroke evaluation.    [CS]  1742 Hospitalist paged.    [CS]  1748 Spoke with Hospitalist who will evaluate for admission.    [CS]    Clinical Course User Index [CS] Pollyann Savoy, MD    Final Clinical Impression(s) / ED Diagnoses Final diagnoses:  Acute ischemic stroke [facility])    Rx / DC Orders ED Discharge Orders    None       Pollyann Savoy, MD 10/08/19 1948

## 2019-10-08 NOTE — H&P (Addendum)
TRH H&P   Patient Demographics:    [name], is a 79 y.o. female  MRN: [id number]   DOB - 1940-03-06  Admit Date - 10/08/2019  Outpatient Primary MD for the patient is Martha Clan, MD    Patient coming from: Home  Chief Complaint  Patient presents with   Stroke Symptoms      HPI:    [name]  is a 79 y.o. female, with past medical history of reported stroke in 1994, obesity, hypertension, dyslipidemia, PAD, depression, ongoing smoking, GERD who lives at home by herself with her sister living about 4 blocks away comes in with chief complaints of a fall which happened on Thursday which is about 4 days ago from today, after the fall she started noticing left arm weakness and some left-sided facial weakness, she also felt quite weak, initially she stayed on the floor for a few hours then got herself in, she also tried to go on with life and drove for couple of days but continued to have left-sided weakness mostly in her left upper extremity and face.  She noticed that water was drooling down the left side of her face at times.    Today she tried to seek medical help in the ER where she was found to have left arm weakness, minimal left-sided facial droop, left-sided pronator drift, CT scan was unremarkable.  Neurology was consulted and we were requested to admit the patient for further stroke work-up.  She currently has minimal generalized headache, denies any chest pain or palpitations, no shortness of breath, no abdominal pain, no dysuria, no diarrhea, no blood in stool or urine, left arm and facial weakness as above, minimal left leg weakness if any.  No joint pains or aches she denies that.  No other subjective  complaints.    Review of systems:    A full 10 point Review of Systems was done, except as stated above, all other Review of Systems were negative.   With Past History of the following :    Past Medical History:  Diagnosis Date   Arthritis    Depression    High cholesterol    Hypertension    Pneumonia    Stroke (HCC)    1994 no residuals      Past Surgical History:  Procedure Laterality Date  ABDOMINAL HYSTERECTOMY     catheterization of the left superficial femoral artery  10/20/2009   SHOULDER ARTHROSCOPY WITH ROTATOR CUFF REPAIR AND SUBACROMIAL DECOMPRESSION Left 01/27/2018   Procedure: LEFT SHOULDER ARTHROSCOPY WITH DEBRIDEMENT, SUBACROMIAL DECOMPRESSION AND ROTATOR CUFF REPAIR;  Surgeon: Kathryne Hitch, MD;  Location: [facility];  Service: Orthopedics;  Laterality: Left;   SHOULDER SURGERY     Left with dr. Gayla Doss arthroscopy with possible rotator cuff repair   TONSILLECTOMY        Social History:     Social History   Tobacco Use   Smoking status: Former Smoker    Packs/day: 0.50    Types: Cigarettes    Quit date: 12/17/2017    Years since quitting: 1.8   Smokeless tobacco: Never Used   Tobacco comment: using nic patches  Substance Use Topics   Alcohol use: Not Currently         Family History :     Family History  Problem Relation Age of Onset   Cancer Mother    Diabetes Mother    Heart disease Mother    Hypertension Mother    Heart attack Mother    Hyperlipidemia Mother    Hypertension Father    Diabetes Sister    Heart disease Sister    Hyperlipidemia Sister    Hypertension Sister    Cancer Sister    Diabetes Brother    Heart disease Brother    Hypertension Brother    Cancer Brother    Hyperlipidemia Brother    Heart disease Son    Heart attack Son        amputation       Home Medications:   Prior to Admission medications   Medication Sig Start Date End Date Taking? Authorizing  Provider  aspirin 81 MG tablet Take 81 mg by mouth daily.    [provider]  Calcium Carbonate-Vitamin D (CALCIUM 600+D HIGH POTENCY PO) Take 1 tablet by mouth daily.     [provider]  Cholecalciferol (VITAMIN D) 2000 UNITS tablet Take 2,000 Units by mouth daily.    [provider]  cyclobenzaprine (FLEXERIL) 10 MG tablet Take 1 tablet (10 mg total) by mouth 2 (two) times daily as needed for muscle spasms. 01/14/19   Alvira Monday, MD  escitalopram (LEXAPRO) 10 MG tablet 1  qam Patient not taking: Reported on 10/21/2018 04/23/16   Archer Asa, MD  hydrALAZINE (APRESOLINE) 25 MG tablet Take 25 mg by mouth 3 (three) times daily as needed for anxiety. 10/31/18   [provider]  HYDROcodone-acetaminophen (NORCO/VICODIN) 5-325 MG tablet Take 1-2 tablets by mouth every 4 (four) hours as needed for moderate pain (pain score 4-6). Patient not taking: Reported on 10/21/2018 01/28/18   Kathryne Hitch, MD  lisinopril (PRINIVIL,ZESTRIL) 40 MG tablet Take 40 mg by mouth daily.    [provider]  pantoprazole (PROTONIX) 20 MG tablet Take 1 tablet (20 mg total) by mouth daily. 01/14/19   Alvira Monday, MD     Allergies:     Allergies  Allergen Reactions   Penicillins Swelling and Other (See Comments)    Has patient had a PCN reaction causing immediate rash, facial/tongue/throat swelling, SOB or lightheadedness with hypotension: Yes Has patient had a PCN reaction causing severe rash involving mucus membranes or skin necrosis: No Has patient had a PCN reaction that required hospitalization: No Has patient had a PCN reaction occurring within the last 10 years: No If all of the above  answers are "NO", then may proceed with Cephalosporin use.    Shellfish Allergy Itching and Swelling   Zoloft [Sertraline] Rash     Physical Exam:   Vitals  Blood pressure (!) 160/97, pulse 82, temperature 98.7 F (37.1 C), temperature source Oral, resp.  rate (!) 21, SpO2 98 %.   1. General obese elderly African-American female lying in hospital bed in no distress,  2. Normal affect and insight, Not Suicidal or Homicidal, Awake Alert,   3. No F.N deficits, ALL C.Nerves Intact, Strength 5/5 all 3 extremities, Sensation intact all 4 extremities, Plantars down going.  Left arm strength is 2/5, minimal left-sided facial droop and minimal left leg weakness.  4. Ears and Eyes appear Normal, Conjunctivae clear, PERRLA. Moist Oral Mucosa.  5. Supple Neck, No JVD, No cervical lymphadenopathy appriciated, No Carotid Bruits.  6. Symmetrical Chest wall movement, Good air movement bilaterally, CTAB.  7. RRR, No Gallops, Rubs or Murmurs, No Parasternal Heave.  8. Positive Bowel Sounds, Abdomen Soft, No tenderness, No organomegaly appriciated,No rebound -guarding or rigidity.  9.  No Cyanosis, Normal Skin Turgor, No Skin Rash or Bruise.  10. Good muscle tone,  joints appear normal , no effusions, Normal ROM.  11. No Palpable Lymph Nodes in Neck or Axillae      Data Review:    CBC Recent Labs  Lab 10/08/19 1559  WBC 4.0  HGB 12.2  HCT 40.6  PLT 226  MCV 87.1  MCH 26.2  MCHC 30.0  RDW 14.6  LYMPHSABS 1.4  MONOABS 0.5  EOSABS 0.0  BASOSABS 0.1   ------------------------------------------------------------------------------------------------------------------  Chemistries  Recent Labs  Lab 10/08/19 1559  NA 139  K 3.4*  CL 104  CO2 25  GLUCOSE 117*  BUN 14  CREATININE 1.05*  CALCIUM 9.4  AST 17  ALT 8  ALKPHOS 43  BILITOT 1.0   ------------------------------------------------------------------------------------------------------------------ CrCl cannot be calculated (Unknown ideal weight.). ------------------------------------------------------------------------------------------------------------------ No results for input(s): TSH, T4TOTAL, T3FREE, THYROIDAB in the last 72 hours.  Invalid input(s):  FREET3  Coagulation profile Recent Labs  Lab 10/08/19 1559  INR 1.1   ------------------------------------------------------------------------------------------------------------------- No results for input(s): DDIMER in the last 72 hours. -------------------------------------------------------------------------------------------------------------------  Cardiac Enzymes No results for input(s): CKMB, TROPONINI, MYOGLOBIN in the last 168 hours.  Invalid input(s): CK ------------------------------------------------------------------------------------------------------------------ No results found for: BNP   ---------------------------------------------------------------------------------------------------------------  Urinalysis    Component Value Date/Time   COLORURINE YELLOW 01/14/2019 1118   APPEARANCEUR CLEAR 01/14/2019 1118   LABSPEC >1.046 (H) 01/14/2019 1118   PHURINE 7.0 01/14/2019 1118   GLUCOSEU 50 (A) 01/14/2019 1118   HGBUR NEGATIVE 01/14/2019 1118   BILIRUBINUR NEGATIVE 01/14/2019 1118   KETONESUR 5 (A) 01/14/2019 1118   PROTEINUR NEGATIVE 01/14/2019 1118   UROBILINOGEN 1.0 12/09/2008 1557   NITRITE NEGATIVE 01/14/2019 1118   LEUKOCYTESUR NEGATIVE 01/14/2019 1118    ----------------------------------------------------------------------------------------------------------------   Imaging Results:    CT HEAD WO CONTRAST  Result Date: 10/08/2019 CLINICAL DATA:  Larey Seat 4 days ago, left arm weakness, left-sided facial droop EXAM: CT HEAD WITHOUT CONTRAST TECHNIQUE: Contiguous axial images were obtained from the base of the skull through the vertex without intravenous contrast. COMPARISON:  10/21/2018 FINDINGS: Brain: Hypodensities are seen throughout the bilateral basal ganglia and periventricular white matter, grossly stable since prior study and compatible with chronic small vessel ischemic changes. Evidence of prior right occipital infarct. No evidence of acute  infarct or hemorrhage. The lateral ventricles and midline structures are otherwise unremarkable. No acute extra-axial fluid collections. No  mass effect. Vascular: No hyperdense vessel or unexpected calcification. Skull: Normal. Negative for fracture or focal lesion. Sinuses/Orbits: Stable polypoid mucosal thickening right maxillary sinus. Remaining sinuses are clear. Other: None. IMPRESSION: 1. Stable chronic ischemic changes as above. No acute intracranial process. Electronically Signed   By: Sharlet SalinaMichael  Brown M.D.   On: 10/08/2019 17:47    My personal review of EKG: Rhythm NSR, possible borderline LVH and repolarization abnormality, diffuse flipped T waves consistent with previous EKGs.   Assessment & Plan:      1.  Fall with sudden onset left arm weakness, left facial weakness with drooling.  Onset of symptoms 4 days ago, CT head unremarkable, suspicious for small to moderate stroke which surprisingly is absent on CT scan.  Case discussed with neurologist Dr. Amada JupiterKirkpatrick who will see the patient shortly, for now she will be admitted for full stroke work-up, stroke protocol initiated, she will be placed on aspirin and statin.  MRI, MRA head and neck along with echocardiogram will be done.  Will check A1c and lipid panel.  PT-OT-speech to evaluate the patient as well.  2.  Dehydration with hypokalemia.  IV fluids and replace potassium.  3.  Fall at home 4 days ago with at least 4 to 6 hours of laying on the floor.  Hydrate, check CK levels.  Currently no joint pains or aches.  4.  Hypertension.  Allow for permissive hypertension, since stroke is potentially 833 to 334 days old will add as needed hydralazine with target systolic of under 170.  5.  Obesity.  Follow with PCP for weight loss.  6.  History of stroke in the past.  Port of care as above.  If new stroke is found and she passes swallow screen neurology may switch her to Plavix or add Plavix to aspirin.  She was taking aspirin at home.  7.   GERD.  Continue PPI.  8.  Depression.  Resume Lexapro once she passes swallow screen.  9. Ongoing smoking.  Counseled to quit  10. PAD -consult to quit smoking, aspirin and statin for now.     COVID-19 screening test is pending, clinical suspicion low.  She has been vaccinated.    DVT Prophylaxis Heparin   AM Labs Ordered, also please review Full Orders  Family Communication: Admission, patients condition and plan of care including tests being ordered have been discussed with the patient  who indicate understanding and agrees with the plan and Code Status.  Code Status Full  Likely DC to  TBD  Condition GUARDED    Consults called: Neuro    Admission status: Inpt    Time spent in minutes : 35   Susa RaringPrashant Nirvan Laban M.D on 10/08/2019 at 6:08 PM  To page go to [email or web address] - password [email or web address]

## 2019-10-08 NOTE — Consult Note (Signed)
NEUROLOGY CONSULTATION NOTE   Date of service: October 08, 2019 Patient Name: [name] MRN:  [id number] DOB:  05-19-1940 Reason for consult: "L sided weakness and concern for stroke"  History of Present Illness  [name] is a 79 y.o. female with PMH significant for HTN, HLD, prior history of stroke in 1994 with no residual deficits who presents with about 4-day history of left-sided weakness involving the left face and left arm.  [name] reports that on Thursday afternoon she was washing her close in the washing machine and noted that her clothes were little wet when she pulled them out of the dryer.  She put them in a basket and walked over to hang them on a line.  She reports that she kind of has to go a little uphill to get to the line and that is where she felt that her left side was weak.  She had a fall and noted that her left arm and left leg were both considerably weaker and she could not get up.  She had to turn over to the right in use the right side of her body to push herself up.  She was somehow barely able to stand up and walk over.  Her symptoms persisted over the last 4 days, albeit a little improved so she eventually came to the ED.  She endorses walking about half a pack a day, does not take aspirin at home.  She endorses history of stroke in her younger sister.  No prior personal history of strokes.  1a Level of Conscious.: 0 1b LOC Questions: 0 1c LOC Commands: 0  2 Best Gaze: 0 3 Visual: 0 4 Facial Palsy: 1 5a Motor Arm - left: 3 5b Motor Arm - Right:0  6a Motor Leg - Left: 3 6b Motor Leg - Right: 0  7 Limb Ataxia: 0 8 Sensory: 0 9 Best Language: 0 10 Dysarthria: 0 11 Extinct. and Inatten.: 0 TOTAL: 7  LKW: 1700 on 10/04/2019. tPA Given: Out of the TPA window Thrombectomy: Out of thrombectomy window.  ROS   Constitutional Denies weight loss, fever and chills.  HEENT Denies changes in vision and hearing.  Respiratory Denies SOB and cough.  CV Denies  palpitations and CP  GI Denies abdominal pain, nausea, vomiting and diarrhea.  GU Denies dysuria and urinary frequency.  MSK Denies myalgia and joint pain.  Skin Denies rash and pruritus.  Neurological Denies headache and syncope.  Psychiatric Denies recent changes in mood. Denies anxiety and depression.   Past History   Past Medical History:  Diagnosis Date  . Arthritis   . Depression   . High cholesterol   . Hypertension   . Pneumonia   . Stroke [facility](HCC)    1994 no residuals   Past Surgical History:  Procedure Laterality Date  . ABDOMINAL HYSTERECTOMY    . catheterization of the left superficial femoral artery  10/20/2009  . SHOULDER ARTHROSCOPY WITH ROTATOR CUFF REPAIR AND SUBACROMIAL DECOMPRESSION Left 01/27/2018   Procedure: LEFT SHOULDER ARTHROSCOPY WITH DEBRIDEMENT, SUBACROMIAL DECOMPRESSION AND ROTATOR CUFF REPAIR;  Surgeon: Kathryne HitchBlackman, Christopher Y, MD;  Location: [facility];  Service: Orthopedics;  Laterality: Left;  . SHOULDER SURGERY     Left with dr. Gayla DossBalckman arthroscopy with possible rotator cuff repair  . TONSILLECTOMY     Family History  Problem Relation Age of Onset  . Cancer Mother   . Diabetes Mother   . Heart disease Mother   . Hypertension Mother   .  Heart attack Mother   . Hyperlipidemia Mother   . Hypertension Father   . Diabetes Sister   . Heart disease Sister   . Hyperlipidemia Sister   . Hypertension Sister   . Cancer Sister   . Diabetes Brother   . Heart disease Brother   . Hypertension Brother   . Cancer Brother   . Hyperlipidemia Brother   . Heart disease Son   . Heart attack Son        amputation   Social History   Socioeconomic History  . Marital status: Widowed    Spouse name: Not on file  . Number of children: Not on file  . Years of education: Not on file  . Highest education level: Not on file  Occupational History  . Not on file  Tobacco Use  . Smoking status: Former Smoker    Packs/day: 0.50    Types: Cigarettes    Quit  date: 12/17/2017    Years since quitting: 1.8  . Smokeless tobacco: Never Used  . Tobacco comment: using nic patches  Vaping Use  . Vaping Use: Never used  Substance and Sexual Activity  . Alcohol use: Not Currently  . Drug use: No  . Sexual activity: Not Currently  Other Topics Concern  . Not on file  Social History Narrative  . Not on file   Social Determinants of Health   Financial Resource Strain:   . Difficulty of Paying Living Expenses:   Food Insecurity:   . Worried About Programme researcher, broadcasting/film/video in the Last Year:   . Barista in the Last Year:   Transportation Needs:   . Freight forwarder (Medical):   Marland Kitchen Lack of Transportation (Non-Medical):   Physical Activity:   . Days of Exercise per Week:   . Minutes of Exercise per Session:   Stress:   . Feeling of Stress :   Social Connections:   . Frequency of Communication with Friends and Family:   . Frequency of Social Gatherings with Friends and Family:   . Attends Religious Services:   . Active Member of Clubs or Organizations:   . Attends Banker Meetings:   Marland Kitchen Marital Status:    Allergies  Allergen Reactions  . Penicillins Swelling and Other (See Comments)    Has patient had a PCN reaction causing immediate rash, facial/tongue/throat swelling, SOB or lightheadedness with hypotension: Yes Has patient had a PCN reaction causing severe rash involving mucus membranes or skin necrosis: No Has patient had a PCN reaction that required hospitalization: No Has patient had a PCN reaction occurring within the last 10 years: No If all of the above answers are "NO", then may proceed with Cephalosporin use.   . Shellfish Allergy Itching and Swelling  . Zoloft [Sertraline] Rash    Medications  (Not in a hospital admission)    Vitals  Temp:  [98.7 F (37.1 C)] 98.7 F (37.1 C) (08/16 1550) Pulse Rate:  [82] 82 (08/16 1550) Resp:  [15-21] 21 (08/16 1733) BP: (133-160)/(79-97) 160/97 (08/16  1733) SpO2:  [96 %-99 %] 98 % (08/16 1733)  There is no height or weight on file to calculate BMI.  Physical Exam   General: Laying comfortably in bed; in no acute distress.  HENT: Normal oropharynx and mucosa. Normal external appearance of ears and nose. Neck: Supple, no pain or tenderness CV: No JVD. No peripheral edema. Pulmonary: Symmetric Chest rise. Normal respiratory effort. Abdomen: Soft to touch, non-tender  Ext: No cyanosis, edema, or deformity  Skin: No rash. Normal palpation of skin.   Musculoskeletal: Normal digits and nails by inspection. No clubbing.  Neurologic Examination  Mental status/Cognition: Alert, oriented to self, place, month and year, good attention. Speech/language: Slowed speech with some slurring of words, comprehension intact, object naming intact, repetition intact. Cranial nerves:   CN II Pupils equal and reactive to light, no VF deficits   CN III,IV,VI EOM intact, no gaze preference or deviation, no nystagmus   CN V normal sensation in V1, V2, and V3 segments bilaterally   CN VII no asymmetry, no nasolabial fold flattening   CN VIII normal hearing to speech   CN IX & X normal palatal elevation, no uvular deviation   CN XI 5/5 head turn and 5/5 shoulder shrug bilaterally   CN XII midline tongue protrusion   Motor:  Muscle bulk: Normal tone normal. Mvmt Root Nerve  Muscle Right Left Comments  SA C5/6 Ax Deltoid 5 1   EF C5/6 Mc Biceps 5 1   EE C6/7/8 Rad Triceps 5 1   WF C6/7 Med FCR 5 1   WE C7/8 PIN ECU 5 1   F Ab C8/T1 U ADM/FDI  1   HF L1/2/3 Fem Illopsoas 5 2   KE L2/3/4 Fem Quad 5 2   DF L4/5 D Peron Tib Ant 5 2   PF S1/2 Tibial Grc/Sol 5 2    Reflexes:  Right Left Comments  Pectoralis      Biceps (C5/6) 2 2   Brachioradialis (C5/6) 2 2    Triceps (C6/7) 1 1    Patellar (L3/4) 3 3    Achilles (S1) 0 0    Hoffman      Plantar     Jaw jerk    Sensation:  Light touch Intact throughout   Pin prick    Temperature    Vibration    Proprioception    Coordination/Complex Motor:  - Finger to Nose intact on R, unable to assess on the left. - Heel to shin intact on the right, unable to assess on the left. - Rapid alternating movement slowed - Gait: Not safe to walk given the extent of the weakness and recent fall.   Labs   Lab Results  Component Value Date   NA 139 10/08/2019   K 3.4 (L) 10/08/2019   CL 104 10/08/2019   CO2 25 10/08/2019   GLUCOSE 117 (H) 10/08/2019   BUN 14 10/08/2019   CREATININE 1.05 (H) 10/08/2019   CALCIUM 9.4 10/08/2019   ALBUMIN 3.8 10/08/2019   AST 17 10/08/2019   ALT 8 10/08/2019   ALKPHOS 43 10/08/2019   BILITOT 1.0 10/08/2019   GFRNONAA 51 (L) 10/08/2019   GFRAA 59 (L) 10/08/2019     Imaging and Diagnostic studies  I personally reviewed MRI brain without contrast which demonstrates a small acute infarct in the right caudate body.  It also demonstrates extensive microvascular white matter disease and multiple old infarcts. Vessel imaging with MRA of her head and neck with stenosis of the proximal right ICA.  Impression   VERA FURNISS is a 79 y.o. female with PMH significant for HTN, HLD, prior history of stroke in 1994 with no residual deficits who presents with about 4-day history of left-sided weakness involving the left face and left arm.  Exam with L sided weakness. Imaging concerning for an acute infarct in the right caudate body.  Impression: - Acute R Caudate ischemic  stroke. - Smoker   Recommendations   - Etiology by TOAST Criteria - pending workup - CT head: neg - CTA brain/neck: R ICA 50% stenosis - MRI: R caudate acute infarct, extensive microvasc disease - SBP: she is more than 24 hours from her symptoms, aim for gradual normotension. - LDL: pending - A1C: pending - TTE: pending - Statin therapy: Atorvastatin 40mg  daily - Antiplatelet therapy: on Aspirin 81mg  - Counseled on smoking  cessation. ______________________________________________________________________   Thank you for the opportunity to take part in the care of this patient. If you have any further questions, please contact the neurology consultation attending.  Signed,  [facility] Pager Number 

## 2019-10-08 NOTE — ED Notes (Signed)
Pt transported to MRI 

## 2019-10-08 NOTE — ED Triage Notes (Signed)
Pt from home with ems. Pt reports she fell down a hill on Thursday. Pt then noticed left arm weakness and when she tried to drink water it came out of the left side of her face. Pt has left sided facial droop, weakness and numbness. Weakness has progressed en route with EMS. Pt a.o,VSS

## 2019-10-09 ENCOUNTER — Inpatient Hospital Stay (HOSPITAL_COMMUNITY): Payer: Medicare HMO

## 2019-10-09 DIAGNOSIS — I639 Cerebral infarction, unspecified: Secondary | ICD-10-CM | POA: Diagnosis not present

## 2019-10-09 DIAGNOSIS — I6389 Other cerebral infarction: Secondary | ICD-10-CM

## 2019-10-09 DIAGNOSIS — F17200 Nicotine dependence, unspecified, uncomplicated: Secondary | ICD-10-CM

## 2019-10-09 LAB — LIPID PANEL
Cholesterol: 158 mg/dL (ref 0–200)
HDL: 50 mg/dL (ref >40)
LDL Cholesterol: 94 mg/dL (ref 0–99)
Total CHOL/HDL Ratio: 3.2 RATIO
Triglycerides: 71 mg/dL (ref <150)
VLDL: 14 mg/dL (ref 0–40)

## 2019-10-09 LAB — ECHOCARDIOGRAM COMPLETE
Area-P 1/2: 3.72 cm2
S' Lateral: 2.5 cm
Weight: 2627.88 oz

## 2019-10-09 LAB — BASIC METABOLIC PANEL
Anion gap: 10 (ref 5–15)
BUN: 10 mg/dL (ref 8–23)
CO2: 25 mmol/L (ref 22–32)
Calcium: 9.5 mg/dL (ref 8.9–10.3)
Chloride: 105 mmol/L (ref 98–111)
Creatinine, Ser: 0.74 mg/dL (ref 0.44–1.00)
GFR calc Af Amer: >60 mL/min (ref >60)
GFR calc non Af Amer: >60 mL/min (ref >60)
Glucose, Bld: 90 mg/dL (ref 70–99)
Potassium: 3.4 mmol/L — ABNORMAL LOW (ref 3.5–5.1)
Sodium: 140 mmol/L (ref 135–145)

## 2019-10-09 LAB — CBC WITH DIFFERENTIAL/PLATELET
Abs Immature Granulocytes: 0.01 10*3/uL (ref 0.00–0.07)
Basophils Absolute: 0.1 10*3/uL (ref 0.0–0.1)
Basophils Relative: 2 %
Eosinophils Absolute: 0.1 10*3/uL (ref 0.0–0.5)
Eosinophils Relative: 2 %
HCT: 40.8 % (ref 36.0–46.0)
Hemoglobin: 12.4 g/dL (ref 12.0–15.0)
Immature Granulocytes: 0 %
Lymphocytes Relative: 40 %
Lymphs Abs: 1.3 10*3/uL (ref 0.7–4.0)
MCH: 25.4 pg — ABNORMAL LOW (ref 26.0–34.0)
MCHC: 30.4 g/dL (ref 30.0–36.0)
MCV: 83.4 fL (ref 80.0–100.0)
Monocytes Absolute: 0.3 10*3/uL (ref 0.1–1.0)
Monocytes Relative: 11 %
Neutro Abs: 1.5 10*3/uL — ABNORMAL LOW (ref 1.7–7.7)
Neutrophils Relative %: 45 %
Platelets: 222 10*3/uL (ref 150–400)
RBC: 4.89 MIL/uL (ref 3.87–5.11)
RDW: 14.4 % (ref 11.5–15.5)
WBC: 3.2 10*3/uL — ABNORMAL LOW (ref 4.0–10.5)
nRBC: 0 % (ref 0.0–0.2)

## 2019-10-09 LAB — HEMOGLOBIN A1C
Hgb A1c MFr Bld: 5.4 % (ref 4.8–5.6)
Mean Plasma Glucose: 108.28 mg/dL

## 2019-10-09 LAB — MAGNESIUM: Magnesium: 2 mg/dL (ref 1.7–2.4)

## 2019-10-09 MED ORDER — BUPROPION HCL ER (XL) 150 MG PO TB24
150.0000 mg | ORAL_TABLET | Freq: Every morning | ORAL | Status: DC
  Administered 2019-10-10 – 2019-10-12 (×3): 150 mg via ORAL
  Filled 2019-10-09 (×3): qty 1

## 2019-10-09 MED ORDER — POTASSIUM CHLORIDE 10 MEQ/100ML IV SOLN
10.0000 meq | INTRAVENOUS | Status: AC
  Administered 2019-10-09 (×2): 10 meq via INTRAVENOUS
  Filled 2019-10-09 (×2): qty 100

## 2019-10-09 NOTE — Progress Notes (Signed)
Progress Note    [name]  ZOX:[id number] DOB: 1940/04/07  DOA: 10/08/2019 PCP: Martha Clan, MD    Brief Narrative:    Medical records reviewed and are as summarized below:  [name] is an 79 y.o. female with past medical history of reported stroke in 1994, obesity, hypertension, dyslipidemia, PAD, depression, ongoing smoking, GERD who lives at home by herself with her sister living about 4 blocks away comes in with chief complaints of a fall which happened on Thursday which is about 4 days ago from today, after the fall she started noticing left arm weakness and some left-sided facial weakness, she also felt quite weak, initially she stayed on the floor for a few hours then got herself in, she also tried to go on with life and drove for couple of days but continued to have left-sided weakness mostly in her left upper extremity and face.  She noticed that water was drooling down the left side of her face at times.   Found to have acute CVA.  Work up for cause in process  Assessment/Plan:   Principal Problem:   CVA (cerebral vascular accident) (HCC) Active Problems:   HYPERCHOLESTEROLEMIA   OBESITY, NOS   TOBACCO DEPENDENCE   HYPERTENSION, BENIGN SYSTEMIC   PVD (peripheral vascular disease) with claudication (HCC)   CVA -Fall with sudden onset left arm weakness, left facial weakness with drooling.  -neurology consult pending from stroke team MRI: Small acute infarct of the right caudate body. No hemorrhage or mass effect -echo pending -aspirin and statin -  A1c: 5.4 and LDL : 94  -PT-OT: SNF -speech to evaluate the patient as well.  Dehydration with hypokalemia -replete via IV -check U/A    Fall at home 4 days ago with at least 4 to 6 hours of laying on the floor.   -Hydrate -PT eval appreciated   Hypertension.  Allow for permissive hypertension, since stroke is potentially 32 to 87 days old will add as needed hydralazine with target systolic of under  170.  overweight Estimated body mass index is 28.19 kg/m as calculated from the following:   Height as of 01/27/18:  (1.626 m).   Weight as of this encounter: 74.5 kg.  GERD.  - Continue PPI.  depression.   -Resume Lexapro once she passes swallow screen.  Tobacco abuse -Counseled to quit, will need to be continued  PAD - aspirin and statin for now.       Family Communication/Anticipated D/C date and plan/Code Status   DVT prophylaxis: Lovenox ordered. Code Status: Full Code.  Family Communication:none at bedside Disposition Plan: Status is: Inpatient  Remains inpatient appropriate because:Unsafe d/c plan   Dispo: The patient is from: Home              Anticipated d/c is to: SNF              Anticipated d/c date is: 2 days              Patient currently is not medically stable to d/c.         Medical Consultants:    neurology     Subjective:   Slow to respond to questions   Objective:    Vitals:   10/08/19 2238 10/09/19 0057 10/09/19 0333 10/09/19 0520  BP: (!) 177/66 (!) 175/91 (!) 170/71 (!) 187/86  Pulse: 69 61 62 63  Resp: Temp: 98.5 F (36.9 C) 98.6 F (  37 C) 98.3 F (36.8 C) 99.3 F (37.4 C)  TempSrc: Oral Oral Oral Oral  SpO2: 99% 98% 98% 100%  Weight:        Intake/Output Summary (Last 24 hours) at 10/09/2019 1057 Last data filed at 10/09/2019 0522 Gross per 24 hour  Intake 582.2 ml  Output 2000 ml  Net -1417.8 ml   Filed Weights   10/08/19 2237  Weight: 74.5 kg    Exam:  General: Appearance:     Overweight female -chronically ill appearing     Lungs:      respirations unlabored  Heart:    Normal heart rate. Normal rhythm. No murmurs, rubs, or gallops.      Neurologic: Awake, slow to respond and seems to have word finding difficulty but will answer questions appropriately    Data Reviewed:   I have personally reviewed following labs and imaging studies:  Labs: Labs show the following:    Basic Metabolic Panel: Recent Labs  Lab 10/08/19 1559 10/08/19 2026 10/09/19 0311  NA 139  --  140  K 3.4*  --  3.4*  CL 104  --  105  CO2 25  --  25  GLUCOSE 117*  --  90  BUN 14  --  10  CREATININE 1.05* 0.80 0.74  CALCIUM 9.4  --  9.5  MG  --   --  2.0   GFR CrCl cannot be calculated (Unknown ideal weight.). Liver Function Tests: Recent Labs  Lab 10/08/19 1559  AST 17  ALT 8  ALKPHOS 43  BILITOT 1.0  PROT 6.8  ALBUMIN 3.8   No results for input(s): LIPASE, AMYLASE in the last 168 hours. No results for input(s): AMMONIA in the last 168 hours. Coagulation profile Recent Labs  Lab 10/08/19 1559  INR 1.1    CBC: Recent Labs  Lab 10/08/19 1559 10/08/19 2026 10/09/19 0311  WBC 4.0 3.6* 3.2*  NEUTROABS 2.0  --  1.5*  HGB 12.2 10.5* 12.4  HCT 40.6 34.6* 40.8  MCV 87.1 87.6 83.4  PLT 226 176 222   Cardiac Enzymes: Recent Labs  Lab 10/08/19 2026  CKTOTAL 278*   BNP (last 3 results) No results for input(s): PROBNP in the last 8760 hours. CBG: No results for input(s): GLUCAP in the last 168 hours. D-Dimer: No results for input(s): DDIMER in the last 72 hours. Hgb A1c: Recent Labs    10/09/19 0311  HGBA1C 5.4   Lipid Profile: Recent Labs    10/09/19 0311  CHOL 158  HDL 50  LDLCALC 94  TRIG 71  CHOLHDL 3.2   Thyroid function studies: No results for input(s): TSH, T4TOTAL, T3FREE, THYROIDAB in the last 72 hours.  Invalid input(s): FREET3 Anemia work up: No results for input(s): VITAMINB12, FOLATE, FERRITIN, TIBC, IRON, RETICCTPCT in the last 72 hours. Sepsis Labs: Recent Labs  Lab 10/08/19 1559 10/08/19 2026 10/09/19 0311  WBC 4.0 3.6* 3.2*    Microbiology Recent Results (from the past 240 hour(s))  SARS Coronavirus 2 by RT PCR (hospital order, performed in [facility] hospital lab) Nasopharyngeal Nasopharyngeal Swab     Status: None   Collection Time: 10/08/19  5:44 PM   Specimen: Nasopharyngeal Swab  Result Value Ref Range  Status   SARS Coronavirus 2 NEGATIVE NEGATIVE Final    Comment: (NOTE) SARS-CoV-2 target nucleic acids are NOT DETECTED.  The SARS-CoV-2 RNA is generally detectable in upper and lower respiratory specimens during the acute phase of infection. The lowest concentration of  SARS-CoV-2 viral copies this assay can detect is 250 copies / mL. A negative result does not preclude SARS-CoV-2 infection and should not be used as the sole basis for treatment or other patient management decisions.  A negative result may occur with improper specimen collection / handling, submission of specimen other than nasopharyngeal swab, presence of viral mutation(s) within the areas targeted by this assay, and inadequate number of viral copies (<250 copies / mL). A negative result must be combined with clinical observations, patient history, and epidemiological information.  Fact Sheet for Patients:   BoilerBrush.com.cy  Fact Sheet for Healthcare Providers: https://pope.com/  This test is not yet approved or  cleared by the [place] FDA and has been authorized for detection and/or diagnosis of SARS-CoV-2 by FDA under an Emergency Use Authorization (EUA).  This EUA will remain in effect (meaning this test can be used) for the duration of the COVID-19 declaration under Section 564(b)(1) of the Act, 21 U.S.C. section 360bbb-3(b)(1), unless the authorization is terminated or revoked sooner.  Performed at [facility], [address] [place], [place] [address]     Procedures and diagnostic studies:  CT HEAD WO CONTRAST  Result Date: 10/08/2019 CLINICAL DATA:  Larey Seat 4 days ago, left arm weakness, left-sided facial droop EXAM: CT HEAD WITHOUT CONTRAST TECHNIQUE: Contiguous axial images were obtained from the base of the skull through the vertex without intravenous contrast. COMPARISON:  10/21/2018 FINDINGS: Brain: Hypodensities are seen throughout the  bilateral basal ganglia and periventricular white matter, grossly stable since prior study and compatible with chronic small vessel ischemic changes. Evidence of prior right occipital infarct. No evidence of acute infarct or hemorrhage. The lateral ventricles and midline structures are otherwise unremarkable. No acute extra-axial fluid collections. No mass effect. Vascular: No hyperdense vessel or unexpected calcification. Skull: Normal. Negative for fracture or focal lesion. Sinuses/Orbits: Stable polypoid mucosal thickening right maxillary sinus. Remaining sinuses are clear. Other: None. IMPRESSION: 1. Stable chronic ischemic changes as above. No acute intracranial process. Electronically Signed   By: Sharlet Salina M.D.   On: 10/08/2019 17:47   MR ANGIO HEAD WO CONTRAST  Result Date: 10/08/2019 CLINICAL DATA:  Stroke, follow up; Neuro deficit, acute, stroke suspected EXAM: MRI HEAD WITHOUT CONTRAST MRA HEAD WITHOUT CONTRAST MRA NECK WITHOUT CONTRAST TECHNIQUE: Multiplanar, multiecho pulse sequences of the brain and surrounding structures were obtained without intravenous contrast. Angiographic images of the Circle of Willis were obtained using MRA technique without intravenous contrast. Angiographic images of the neck were obtained using MRA technique without intravenous contrast. Carotid stenosis measurements (when applicable) are obtained utilizing NASCET criteria, using the distal internal carotid diameter as the denominator. COMPARISON:  None. FINDINGS: MRI HEAD FINDINGS BRAIN: There is a small acute infarct of the right caudate body. There are multiple old small vessel infarcts of the periventricular white matter, brainstem and cerebellum. There is an old right occipital infarct. Early confluent hyperintense T2-weighted signal of the periventricular and deep white matter, most commonly due to chronic ischemic microangiopathy. Generalized atrophy without lobar predilection. Approximately 10 scattered  chronic microhemorrhages. Midline structures are normal. SKULL AND UPPER CERVICAL SPINE: The visualized skull base, calvarium, upper cervical spine and extracranial soft tissues are normal. SINUSES/ORBITS: No fluid levels or advanced mucosal thickening. No mastoid or middle ear effusion. The orbits are normal. MRA HEAD FINDINGS POSTERIOR CIRCULATION: --Basilar artery: Normal. --Posterior cerebral arteries: Normal. Both originate from the basilar artery. --Superior cerebellar arteries: Normal. --Inferior cerebellar arteries: Normal anterior and posterior inferior cerebellar  arteries. ANTERIOR CIRCULATION: --Intracranial internal carotid arteries: Normal. --Anterior cerebral arteries: There is a 4 mm anterior communicating artery aneurysm. Otherwise the anterior cerebral arteries are normal. --Middle cerebral arteries: Normal. --Posterior communicating arteries: Present on the left, absent on the right. MRA NECK FINDINGS Limited MRA of the neck. The imaged portions of these cervical carotid and vertebral arteries are patent. There is 50% stenosis of the proximal right internal carotid artery. IMPRESSION: 1. Small acute infarct of the right caudate body. No hemorrhage or mass effect. 2. Multiple old small vessel infarcts and other sequelae of chronic ischemic microangiopathy. 3. No emergent large vessel occlusion or high-grade stenosis. 4. 50% stenosis of the proximal right internal carotid artery. 5. 4 mm anterior communicating artery aneurysm. Electronically Signed   By: Deatra RobinsonKevin  Herman M.D.   On: 10/08/2019 20:08   MR ANGIO NECK WO CONTRAST  Result Date: 10/08/2019 CLINICAL DATA:  Stroke, follow up; Neuro deficit, acute, stroke suspected EXAM: MRI HEAD WITHOUT CONTRAST MRA HEAD WITHOUT CONTRAST MRA NECK WITHOUT CONTRAST TECHNIQUE: Multiplanar, multiecho pulse sequences of the brain and surrounding structures were obtained without intravenous contrast. Angiographic images of the Circle of Willis were obtained using  MRA technique without intravenous contrast. Angiographic images of the neck were obtained using MRA technique without intravenous contrast. Carotid stenosis measurements (when applicable) are obtained utilizing NASCET criteria, using the distal internal carotid diameter as the denominator. COMPARISON:  None. FINDINGS: MRI HEAD FINDINGS BRAIN: There is a small acute infarct of the right caudate body. There are multiple old small vessel infarcts of the periventricular white matter, brainstem and cerebellum. There is an old right occipital infarct. Early confluent hyperintense T2-weighted signal of the periventricular and deep white matter, most commonly due to chronic ischemic microangiopathy. Generalized atrophy without lobar predilection. Approximately 10 scattered chronic microhemorrhages. Midline structures are normal. SKULL AND UPPER CERVICAL SPINE: The visualized skull base, calvarium, upper cervical spine and extracranial soft tissues are normal. SINUSES/ORBITS: No fluid levels or advanced mucosal thickening. No mastoid or middle ear effusion. The orbits are normal. MRA HEAD FINDINGS POSTERIOR CIRCULATION: --Basilar artery: Normal. --Posterior cerebral arteries: Normal. Both originate from the basilar artery. --Superior cerebellar arteries: Normal. --Inferior cerebellar arteries: Normal anterior and posterior inferior cerebellar arteries. ANTERIOR CIRCULATION: --Intracranial internal carotid arteries: Normal. --Anterior cerebral arteries: There is a 4 mm anterior communicating artery aneurysm. Otherwise the anterior cerebral arteries are normal. --Middle cerebral arteries: Normal. --Posterior communicating arteries: Present on the left, absent on the right. MRA NECK FINDINGS Limited MRA of the neck. The imaged portions of these cervical carotid and vertebral arteries are patent. There is 50% stenosis of the proximal right internal carotid artery. IMPRESSION: 1. Small acute infarct of the right caudate body. No  hemorrhage or mass effect. 2. Multiple old small vessel infarcts and other sequelae of chronic ischemic microangiopathy. 3. No emergent large vessel occlusion or high-grade stenosis. 4. 50% stenosis of the proximal right internal carotid artery. 5. 4 mm anterior communicating artery aneurysm. Electronically Signed   By: Deatra RobinsonKevin  Herman M.D.   On: 10/08/2019 20:08   MR BRAIN WO CONTRAST  Result Date: 10/08/2019 CLINICAL DATA:  Stroke, follow up; Neuro deficit, acute, stroke suspected EXAM: MRI HEAD WITHOUT CONTRAST MRA HEAD WITHOUT CONTRAST MRA NECK WITHOUT CONTRAST TECHNIQUE: Multiplanar, multiecho pulse sequences of the brain and surrounding structures were obtained without intravenous contrast. Angiographic images of the Circle of Willis were obtained using MRA technique without intravenous contrast. Angiographic images of the neck were obtained using MRA technique without  intravenous contrast. Carotid stenosis measurements (when applicable) are obtained utilizing NASCET criteria, using the distal internal carotid diameter as the denominator. COMPARISON:  None. FINDINGS: MRI HEAD FINDINGS BRAIN: There is a small acute infarct of the right caudate body. There are multiple old small vessel infarcts of the periventricular white matter, brainstem and cerebellum. There is an old right occipital infarct. Early confluent hyperintense T2-weighted signal of the periventricular and deep white matter, most commonly due to chronic ischemic microangiopathy. Generalized atrophy without lobar predilection. Approximately 10 scattered chronic microhemorrhages. Midline structures are normal. SKULL AND UPPER CERVICAL SPINE: The visualized skull base, calvarium, upper cervical spine and extracranial soft tissues are normal. SINUSES/ORBITS: No fluid levels or advanced mucosal thickening. No mastoid or middle ear effusion. The orbits are normal. MRA HEAD FINDINGS POSTERIOR CIRCULATION: --Basilar artery: Normal. --Posterior cerebral  arteries: Normal. Both originate from the basilar artery. --Superior cerebellar arteries: Normal. --Inferior cerebellar arteries: Normal anterior and posterior inferior cerebellar arteries. ANTERIOR CIRCULATION: --Intracranial internal carotid arteries: Normal. --Anterior cerebral arteries: There is a 4 mm anterior communicating artery aneurysm. Otherwise the anterior cerebral arteries are normal. --Middle cerebral arteries: Normal. --Posterior communicating arteries: Present on the left, absent on the right. MRA NECK FINDINGS Limited MRA of the neck. The imaged portions of these cervical carotid and vertebral arteries are patent. There is 50% stenosis of the proximal right internal carotid artery. IMPRESSION: 1. Small acute infarct of the right caudate body. No hemorrhage or mass effect. 2. Multiple old small vessel infarcts and other sequelae of chronic ischemic microangiopathy. 3. No emergent large vessel occlusion or high-grade stenosis. 4. 50% stenosis of the proximal right internal carotid artery. 5. 4 mm anterior communicating artery aneurysm. Electronically Signed   By: Deatra Robinson M.D.   On: 10/08/2019 20:08   DG Chest Port 1 View  Result Date: 10/08/2019 CLINICAL DATA:  79 year old female with shortness of breath. EXAM: PORTABLE CHEST 1 VIEW COMPARISON:  Chest radiograph dated 12/09/2008 and CT dated 01/14/2019 FINDINGS: The lungs are clear. There is no pleural effusion pneumothorax. Mild cardiomegaly. Atherosclerotic calcification of the aorta. No acute osseous pathology. IMPRESSION: No active disease. Electronically Signed   By: Elgie Collard M.D.   On: 10/08/2019 19:05    Medications:   .  stroke: mapping our early stages of recovery book   Does not apply Once  . aspirin  300 mg Rectal Daily   Or  . aspirin  325 mg Oral Daily  . atorvastatin  40 mg Oral Daily  . escitalopram  10 mg Oral QHS  . heparin  5,000 Units Subcutaneous Q8H  . pantoprazole  20 mg Oral Daily  . sodium  chloride flush  3 mL Intravenous Once   Continuous Infusions: . sodium chloride 50 mL/hr at 10/08/19 2051     LOS: 1 day   Joseph Art  [facility]   How to contact the [facility] Attending or Consulting provider 7A - 7P or covering provider during after hours 7P -7A, for this patient?  1. Check the care team in [facility] and look for a) attending/consulting [facility] provider listed and b) the [facility] team listed 2. Log into [email or web address] and use [facility]'s universal password to access. If you do not have the password, please contact the hospital operator. 3. Locate the [facility] provider you are looking for under [facility] and page to a number that you can be directly reached. 4. If you still have difficulty reaching the provider, please page the [facility] (Director on  Call) for the Hospitalists listed on amion for assistance.  10/09/2019, 10:57 AM

## 2019-10-09 NOTE — NC FL2 (Addendum)
[place] MEDICAID FL2 LEVEL OF CARE SCREENING TOOL     IDENTIFICATION  Patient Name: [name] Birthdate: Oct 09, 1940 Sex: female Admission Date (Current Location): 10/08/2019  [facility] and [place] Number:  Producer, television/film/video and Address:  [facility], [address] [place], [place] [address]      Provider Number: [id number]  Attending Physician Name and Address:  Joseph Art, DO  Relative Name and Phone Number:       Current Level of Care: Hospital Recommended Level of Care: Skilled Nursing Facility Prior Approval Number:    Date Approved/Denied:   PASRR Number: [id number]  Discharge Plan: SNF    Current Diagnoses: Patient Active Problem List   Diagnosis Date Noted  . CVA (cerebral vascular accident) (HCC) 10/08/2019  . Status post left rotator cuff repair 02/13/2018  . Complete rotator cuff tear of left shoulder 01/27/2018  . S/P left rotator cuff repair 01/27/2018  . PVD (peripheral vascular disease) with claudication (HCC) 05/24/2012  . Peripheral vascular disease, unspecified (HCC) 11/24/2011  . HYPERCHOLESTEROLEMIA 04/21/2006  . OBESITY, NOS 04/21/2006  . DEPRESSION, MAJOR, RECURRENT 04/21/2006  . TOBACCO DEPENDENCE 04/21/2006  . HYPERTENSION, BENIGN SYSTEMIC 04/21/2006  . HAND PAIN 04/21/2006    Orientation RESPIRATION BLADDER Height & Weight     Time, Situation, Place, Self  Normal Continent Weight: 164 lb 3.9 oz (74.5 kg) Height:     BEHAVIORAL SYMPTOMS/MOOD NEUROLOGICAL BOWEL NUTRITION STATUS      Continent Diet (see discharge summary)  AMBULATORY STATUS COMMUNICATION OF NEEDS Skin   Extensive Assist Verbally Normal                       Personal Care Assistance Level of Assistance  Bathing, Feeding, Dressing Bathing Assistance: Maximum assistance Feeding assistance: Limited assistance Dressing Assistance: Maximum assistance     Functional Limitations Info  Sight, Hearing, Speech Sight Info:  Adequate Hearing Info: Adequate Speech Info: Adequate    SPECIAL CARE FACTORS FREQUENCY  PT (By licensed PT), OT (By licensed OT)     PT Frequency: 5x week OT Frequency: 5x week            Contractures Contractures Info: Not present    Additional Factors Info  Code Status, Allergies, Psychotropic Code Status Info: Full Code Allergies Info: Penicillins, Shellfish Allergy, Zoloft (Sertraline) Psychotropic Info: escitalopram (LEXAPRO) tablet 10 mg daily at bedtime         Current Medications (10/09/2019):  This is the current hospital active medication list Current Facility-Administered Medications  Medication Dose Route Frequency Provider Last Rate Last Admin  .  stroke: mapping our early stages of recovery book   Does not apply Once Susa Raring K, MD      . 0.9 %  sodium chloride infusion   Intravenous Continuous Leroy Sea, MD 50 mL/hr at 10/08/19 2051 New Bag at 10/08/19 2051  . acetaminophen (TYLENOL) tablet 650 mg  650 mg Oral Q4H PRN Leroy Sea, MD       Or  . acetaminophen (TYLENOL) 160 MG/5ML solution 650 mg  650 mg Per Tube Q4H PRN Leroy Sea, MD       Or  . acetaminophen (TYLENOL) suppository 650 mg  650 mg Rectal Q4H PRN Leroy Sea, MD      . aspirin suppository 300 mg  300 mg Rectal Daily Leroy Sea, MD       Or  . aspirin tablet 325 mg  325 mg Oral Daily Leroy Sea, MD   325 mg at 10/08/19 2053  . atorvastatin (LIPITOR) tablet 40 mg  40 mg Oral Daily Leroy Sea, MD   40 mg at 10/08/19 2053  . escitalopram (LEXAPRO) tablet 10 mg  10 mg Oral QHS Susa Raring K, MD      . heparin injection 5,000 Units  5,000 Units Subcutaneous Q8H Leroy Sea, MD   5,000 Units at 10/09/19 0600  . hydrALAZINE (APRESOLINE) injection 10 mg  10 mg Intravenous Q6H PRN Leroy Sea, MD   10 mg at 10/09/19 0600  . ondansetron (ZOFRAN) injection 4 mg  4 mg Intravenous Q6H PRN Leroy Sea, MD      . pantoprazole  (PROTONIX) EC tablet 20 mg  20 mg Oral Daily Susa Raring K, MD      . potassium chloride 10 mEq in 100 mL IVPB  10 mEq Intravenous Q1 Hr x 2 Vann, Jessica U, DO      . senna-docusate (Senokot-S) tablet 1 tablet  1 tablet Oral QHS PRN Leroy Sea, MD      . sodium chloride flush (NS) 0.9 % injection 3 mL  3 mL Intravenous Once Pollyann Savoy, MD         Discharge Medications: Please see discharge summary for a list of discharge medications.  Relevant Imaging Results:  Relevant Lab Results:   Additional Information SS#[id number] [place], [place]

## 2019-10-09 NOTE — Progress Notes (Signed)
Pharmacy is unable to completely update the PTA med list. The patient is a poor historian and no one helps her with her medications. We have added last filled dates where possible.  Let pharmacy know if we can be of further assistance.  Charolotte Eke, PharmD Admin. Coordinator of Transitions of Care 10/09/2019,10:28 AM

## 2019-10-09 NOTE — Social Work (Signed)
Attempted to see pt x2, busy with staff or off floor.  CSW continuing to follow for support with disposition when medically appropriate.  Octavio Graves, MSW, LCSW [facility] Clinical Social Work

## 2019-10-09 NOTE — Progress Notes (Signed)
STROKE TEAM PROGRESS NOTE   INTERVAL HISTORY I personally reviewed history of presenting illness with the patient, electronic medical records and imaging films in PACS.  She presented with 4-day history of left-sided weakness and MRI shows a large right caudate subcortical infarct with old lacunar infarcts as well.  MRI of the brain and neck are suboptimal but to suggest possible right carotid stenosis.  Carotid ultrasound is pending.  LDL cholesterol 94 mg percent.  Hemoglobin A1c is 5.4.  Vitals:   10/08/19 2238 10/09/19 0057 10/09/19 0333 10/09/19 0520  BP: (!) 177/66 (!) 175/91 (!) 170/71 (!) 187/86  Pulse: 69 61 62 63  Resp: 20 18 16 18   Temp: 98.5 F (36.9 C) 98.6 F (37 C) 98.3 F (36.8 C) 99.3 F (37.4 C)  TempSrc: Oral Oral Oral Oral  SpO2: 99% 98% 98% 100%  Weight:       CBC:  Recent Labs  Lab 10/08/19 1559 10/08/19 1559 10/08/19 2026 10/09/19 0311  WBC 4.0   < > 3.6* 3.2*  NEUTROABS 2.0  --   --  1.5*  HGB 12.2   < > 10.5* 12.4  HCT 40.6   < > 34.6* 40.8  MCV 87.1   < > 87.6 83.4  PLT 226   < > 176 222   < > = values in this interval not displayed.   Basic Metabolic Panel:  Recent Labs  Lab 10/08/19 1559 10/08/19 1559 10/08/19 2026 10/09/19 0311  NA 139  --   --  140  K 3.4*  --   --  3.4*  CL 104  --   --  105  CO2 25  --   --  25  GLUCOSE 117*  --   --  90  BUN 14  --   --  10  CREATININE 1.05*   < > 0.80 0.74  CALCIUM 9.4  --   --  9.5  MG  --   --   --  2.0   < > = values in this interval not displayed.   Lipid Panel:  Recent Labs  Lab 10/09/19 0311  CHOL 158  TRIG 71  HDL 50  CHOLHDL 3.2  VLDL 14  LDLCALC 94   HgbA1c:  Recent Labs  Lab 10/09/19 0311  HGBA1C 5.4   Urine Drug Screen: No results for input(s): LABOPIA, COCAINSCRNUR, LABBENZ, AMPHETMU, THCU, LABBARB in the last 168 hours.  Alcohol Level No results for input(s): ETH in the last 168 hours.  IMAGING past 24 hours CT HEAD WO CONTRAST  Result Date: 10/08/2019 CLINICAL  DATA:  10/10/2019 4 days ago, left arm weakness, left-sided facial droop EXAM: CT HEAD WITHOUT CONTRAST TECHNIQUE: Contiguous axial images were obtained from the base of the skull through the vertex without intravenous contrast. COMPARISON:  10/21/2018 FINDINGS: Brain: Hypodensities are seen throughout the bilateral basal ganglia and periventricular white matter, grossly stable since prior study and compatible with chronic small vessel ischemic changes. Evidence of prior right occipital infarct. No evidence of acute infarct or hemorrhage. The lateral ventricles and midline structures are otherwise unremarkable. No acute extra-axial fluid collections. No mass effect. Vascular: No hyperdense vessel or unexpected calcification. Skull: Normal. Negative for fracture or focal lesion. Sinuses/Orbits: Stable polypoid mucosal thickening right maxillary sinus. Remaining sinuses are clear. Other: None. IMPRESSION: 1. Stable chronic ischemic changes as above. No acute intracranial process. Electronically Signed   By: 10/23/2018 M.D.   On: 10/08/2019 17:47   MR ANGIO HEAD WO  CONTRAST  Result Date: 10/08/2019 CLINICAL DATA:  Stroke, follow up; Neuro deficit, acute, stroke suspected EXAM: MRI HEAD WITHOUT CONTRAST MRA HEAD WITHOUT CONTRAST MRA NECK WITHOUT CONTRAST TECHNIQUE: Multiplanar, multiecho pulse sequences of the brain and surrounding structures were obtained without intravenous contrast. Angiographic images of the Circle of Willis were obtained using MRA technique without intravenous contrast. Angiographic images of the neck were obtained using MRA technique without intravenous contrast. Carotid stenosis measurements (when applicable) are obtained utilizing NASCET criteria, using the distal internal carotid diameter as the denominator. COMPARISON:  None. FINDINGS: MRI HEAD FINDINGS BRAIN: There is a small acute infarct of the right caudate body. There are multiple old small vessel infarcts of the periventricular white  matter, brainstem and cerebellum. There is an old right occipital infarct. Early confluent hyperintense T2-weighted signal of the periventricular and deep white matter, most commonly due to chronic ischemic microangiopathy. Generalized atrophy without lobar predilection. Approximately 10 scattered chronic microhemorrhages. Midline structures are normal. SKULL AND UPPER CERVICAL SPINE: The visualized skull base, calvarium, upper cervical spine and extracranial soft tissues are normal. SINUSES/ORBITS: No fluid levels or advanced mucosal thickening. No mastoid or middle ear effusion. The orbits are normal. MRA HEAD FINDINGS POSTERIOR CIRCULATION: --Basilar artery: Normal. --Posterior cerebral arteries: Normal. Both originate from the basilar artery. --Superior cerebellar arteries: Normal. --Inferior cerebellar arteries: Normal anterior and posterior inferior cerebellar arteries. ANTERIOR CIRCULATION: --Intracranial internal carotid arteries: Normal. --Anterior cerebral arteries: There is a 4 mm anterior communicating artery aneurysm. Otherwise the anterior cerebral arteries are normal. --Middle cerebral arteries: Normal. --Posterior communicating arteries: Present on the left, absent on the right. MRA NECK FINDINGS Limited MRA of the neck. The imaged portions of these cervical carotid and vertebral arteries are patent. There is 50% stenosis of the proximal right internal carotid artery. IMPRESSION: 1. Small acute infarct of the right caudate body. No hemorrhage or mass effect. 2. Multiple old small vessel infarcts and other sequelae of chronic ischemic microangiopathy. 3. No emergent large vessel occlusion or high-grade stenosis. 4. 50% stenosis of the proximal right internal carotid artery. 5. 4 mm anterior communicating artery aneurysm. Electronically Signed   By: Deatra RobinsonKevin  Herman M.D.   On: 10/08/2019 20:08   MR ANGIO NECK WO CONTRAST  Result Date: 10/08/2019 CLINICAL DATA:  Stroke, follow up; Neuro deficit, acute,  stroke suspected EXAM: MRI HEAD WITHOUT CONTRAST MRA HEAD WITHOUT CONTRAST MRA NECK WITHOUT CONTRAST TECHNIQUE: Multiplanar, multiecho pulse sequences of the brain and surrounding structures were obtained without intravenous contrast. Angiographic images of the Circle of Willis were obtained using MRA technique without intravenous contrast. Angiographic images of the neck were obtained using MRA technique without intravenous contrast. Carotid stenosis measurements (when applicable) are obtained utilizing NASCET criteria, using the distal internal carotid diameter as the denominator. COMPARISON:  None. FINDINGS: MRI HEAD FINDINGS BRAIN: There is a small acute infarct of the right caudate body. There are multiple old small vessel infarcts of the periventricular white matter, brainstem and cerebellum. There is an old right occipital infarct. Early confluent hyperintense T2-weighted signal of the periventricular and deep white matter, most commonly due to chronic ischemic microangiopathy. Generalized atrophy without lobar predilection. Approximately 10 scattered chronic microhemorrhages. Midline structures are normal. SKULL AND UPPER CERVICAL SPINE: The visualized skull base, calvarium, upper cervical spine and extracranial soft tissues are normal. SINUSES/ORBITS: No fluid levels or advanced mucosal thickening. No mastoid or middle ear effusion. The orbits are normal. MRA HEAD FINDINGS POSTERIOR CIRCULATION: --Basilar artery: Normal. --Posterior cerebral arteries: Normal. Both originate  from the basilar artery. --Superior cerebellar arteries: Normal. --Inferior cerebellar arteries: Normal anterior and posterior inferior cerebellar arteries. ANTERIOR CIRCULATION: --Intracranial internal carotid arteries: Normal. --Anterior cerebral arteries: There is a 4 mm anterior communicating artery aneurysm. Otherwise the anterior cerebral arteries are normal. --Middle cerebral arteries: Normal. --Posterior communicating arteries:  Present on the left, absent on the right. MRA NECK FINDINGS Limited MRA of the neck. The imaged portions of these cervical carotid and vertebral arteries are patent. There is 50% stenosis of the proximal right internal carotid artery. IMPRESSION: 1. Small acute infarct of the right caudate body. No hemorrhage or mass effect. 2. Multiple old small vessel infarcts and other sequelae of chronic ischemic microangiopathy. 3. No emergent large vessel occlusion or high-grade stenosis. 4. 50% stenosis of the proximal right internal carotid artery. 5. 4 mm anterior communicating artery aneurysm. Electronically Signed   By: Deatra Robinson M.D.   On: 10/08/2019 20:08   MR BRAIN WO CONTRAST  Result Date: 10/08/2019 CLINICAL DATA:  Stroke, follow up; Neuro deficit, acute, stroke suspected EXAM: MRI HEAD WITHOUT CONTRAST MRA HEAD WITHOUT CONTRAST MRA NECK WITHOUT CONTRAST TECHNIQUE: Multiplanar, multiecho pulse sequences of the brain and surrounding structures were obtained without intravenous contrast. Angiographic images of the Circle of Willis were obtained using MRA technique without intravenous contrast. Angiographic images of the neck were obtained using MRA technique without intravenous contrast. Carotid stenosis measurements (when applicable) are obtained utilizing NASCET criteria, using the distal internal carotid diameter as the denominator. COMPARISON:  None. FINDINGS: MRI HEAD FINDINGS BRAIN: There is a small acute infarct of the right caudate body. There are multiple old small vessel infarcts of the periventricular white matter, brainstem and cerebellum. There is an old right occipital infarct. Early confluent hyperintense T2-weighted signal of the periventricular and deep white matter, most commonly due to chronic ischemic microangiopathy. Generalized atrophy without lobar predilection. Approximately 10 scattered chronic microhemorrhages. Midline structures are normal. SKULL AND UPPER CERVICAL SPINE: The  visualized skull base, calvarium, upper cervical spine and extracranial soft tissues are normal. SINUSES/ORBITS: No fluid levels or advanced mucosal thickening. No mastoid or middle ear effusion. The orbits are normal. MRA HEAD FINDINGS POSTERIOR CIRCULATION: --Basilar artery: Normal. --Posterior cerebral arteries: Normal. Both originate from the basilar artery. --Superior cerebellar arteries: Normal. --Inferior cerebellar arteries: Normal anterior and posterior inferior cerebellar arteries. ANTERIOR CIRCULATION: --Intracranial internal carotid arteries: Normal. --Anterior cerebral arteries: There is a 4 mm anterior communicating artery aneurysm. Otherwise the anterior cerebral arteries are normal. --Middle cerebral arteries: Normal. --Posterior communicating arteries: Present on the left, absent on the right. MRA NECK FINDINGS Limited MRA of the neck. The imaged portions of these cervical carotid and vertebral arteries are patent. There is 50% stenosis of the proximal right internal carotid artery. IMPRESSION: 1. Small acute infarct of the right caudate body. No hemorrhage or mass effect. 2. Multiple old small vessel infarcts and other sequelae of chronic ischemic microangiopathy. 3. No emergent large vessel occlusion or high-grade stenosis. 4. 50% stenosis of the proximal right internal carotid artery. 5. 4 mm anterior communicating artery aneurysm. Electronically Signed   By: Deatra Robinson M.D.   On: 10/08/2019 20:08   DG Chest Port 1 View  Result Date: 10/08/2019 CLINICAL DATA:  79 year old female with shortness of breath. EXAM: PORTABLE CHEST 1 VIEW COMPARISON:  Chest radiograph dated 12/09/2008 and CT dated 01/14/2019 FINDINGS: The lungs are clear. There is no pleural effusion pneumothorax. Mild cardiomegaly. Atherosclerotic calcification of the aorta. No acute osseous pathology. IMPRESSION: No active  disease. Electronically Signed   By: Elgie Collard M.D.   On: 10/08/2019 19:05    PHYSICAL  EXAM Pleasant elderly African-American lady not in distress. . Afebrile. Head is nontraumatic. Neck is supple without bruit.    Cardiac exam no murmur or gallop. Lungs are clear to auscultation. Distal pulses are well felt. Neurological Exam ; Awake alert oriented x 3 normal speech and language. Mild left lower face asymmetry. Tongue midline.  Mild left hemiparesis with upper and lower extremity drift.  Drift. Mild diminished fine finger movements on left. Orbits right over left upper extremity. Mild left grip weak.. Normal sensation . Normal coordination.  ASSESSMENT/PLAN [name] is a 79 y.o. female with history of HTN, HLD, prior history of stroke in 1994 with no residual deficits presenting with L sided weakness.   Stroke: Large R corona radiata infarct likely embolic secondary to unknown source  CT head No acute abnormality.   MRI large R caudate infarct. Multiple old small vessel disease infarcts.  MRA head & neck no ELVO, proximal R ICA 50% stenosis. 66mm ACA aneurysm  Carotid Doppler  pending (recheck R ICA)  2D Echo pending   LDL 94  HgbA1c 5.4  VTE prophylaxis - Heparin 5000 units sq tid   aspirin 81 mg daily prior to admission, now on aspirin 300 mg suppository daily. Plan DAPT x 3 weeks once able to swallow.    Therapy recommendations:  SNF per OT, PT to see  Disposition:  pending (lived alone PTA)  Hypertension  Stable . Permissive hypertension (OK if < 220/120) but gradually normalize in 5-7 days . Long-term BP goal normotensive  Hyperlipidemia  Home meds:  No statin  Now on lipitor 40   LDL 94, goal < 70  Continue statin at discharge  Dysphagia . Secondary to stroke . NPO . Passed swallow yesterday but today w/ water running out side of mouth . Speech to see  Other Stroke Risk Factors  Advanced age  Cigarette smoker  Overweight, Body mass index is 28.19 kg/m., recommend weight loss, diet and exercise as appropriate   Hx  stroke/TIA  1994 - dizzy - no residual per pt    PAD s/p angioplasty and stent L FSA 2007  Other Active Problems  Slow cognition, ? baseline  Dehydration  Hypokalemia  Fall  GERD  deoression   Hospital day # 1 She presented with several days of left hemiparesis secondary to large right subcortical infarct.  MRI shows previous old lacunar strokes but this infarct seems quite large hence will check for cardiac source of embolism like paroxysmal A. fib.  Check echocardiogram.  May need loop recorder at discharge.  Mobilize out of bed.  Therapy consults.  Will likely need rehab.  Greater than 50% time during this 35-minute visit was spent on counseling and coordination of care about her stroke and answering questions. Delia Heady, MD To contact Stroke Continuity provider, please refer to [email or web address]. After hours, contact General Neurology

## 2019-10-09 NOTE — TOC Initial Note (Addendum)
Transition of Care [facility]) - Initial/Assessment Note    Patient Details  Name: [name] MRN: [id number] Date of Birth: 08/16/1940  Transition [facility]) CM/SW Contact:    Doy Hutching, LCSW Phone Number: 10/09/2019, 4:14 PM  Clinical Narrative:                 CSW spoke with pt and pt sister [name] at bedside. Pt very quiet throughout, defers to sister for most questions. Introduced self, role, reason for visit. Pt from home alone, she has had a hx of strokes but does not use any DME at baseline (had a walker but she's not sure if she still has it). Confirmed home address and PCP. Pt with two children that lived in town but prefers me to call her sister if needed (added her info to chart). We discussed current SNF offers and pt/pt sister state agreement due to limited assistance at home. Will send referrals. Pt managed by [facility]; did not ask vaccination status at this time.   Expected Discharge Plan: Skilled Nursing Facility Barriers to Discharge: Continued Medical Work up, English as a second language teacher   Patient Goals and CMS Choice CMS Medicare.gov Compare Post Acute Care list provided to:: Patient Choice offered to / list presented to : Patient  Expected Discharge Plan and Services Expected Discharge Plan: Skilled Nursing Facility In-house Referral: Clinical Social Work Discharge Planning Services: CM Consult Living arrangements for the past 2 months: Single Family Home  Prior Living Arrangements/Services Living arrangements for the past 2 months: Single Family Home Lives with:: Self Patient language and need for interpreter reviewed:: Yes (no needs) Do you feel safe going back to the place where you live?: Yes      Need for Family Participation in Patient Care: Yes (Comment) (assistance w/ daily cares) Care giver support system in place?: Yes (comment) Current home services: DME Criminal Activity/Legal Involvement Pertinent to Current Situation/Hospitalization: No -  Comment as needed  Activities of Daily Living Home Assistive Devices/Equipment: None    Permission Sought/Granted Permission sought to share information with : Family Supports, Oceanographer granted to share information with : Yes, Verbal Permission Granted  Share Information with NAME: Etheleen Mayhew  Permission granted to share info w AGENCY: SNFs  Permission granted to share info w Relationship: sister  Permission granted to share info w Contact Information: [phone] or [phone]  Emotional Assessment Appearance:: Appears stated age Attitude/Demeanor/Rapport: Guarded Affect (typically observed): Withdrawn Orientation: : Oriented to Self, Oriented to Place, Oriented to  Time, Oriented to Situation Alcohol / Substance Use: Not Applicable Psych Involvement: No (comment)  Admission diagnosis:  Weakness [R53.1] SOB (shortness of breath) [R06.02] CVA (cerebral vascular accident) (HCC) [I63.9] Acute ischemic stroke [facility]) [I63.9] Patient Active Problem List   Diagnosis Date Noted  . CVA (cerebral vascular accident) (HCC) 10/08/2019  . Status post left rotator cuff repair 02/13/2018  . Complete rotator cuff tear of left shoulder 01/27/2018  . S/P left rotator cuff repair 01/27/2018  . PVD (peripheral vascular disease) with claudication (HCC) 05/24/2012  . Peripheral vascular disease, unspecified (HCC) 11/24/2011  . HYPERCHOLESTEROLEMIA 04/21/2006  . OBESITY, NOS 04/21/2006  . DEPRESSION, MAJOR, RECURRENT 04/21/2006  . TOBACCO DEPENDENCE 04/21/2006  . HYPERTENSION, BENIGN SYSTEMIC 04/21/2006  . HAND PAIN 04/21/2006   PCP:  Martha Clan, MD Pharmacy:   [phone] - [place], [place] - [address] AT [facility] [address] [place] [address] Phone: [phone] Fax: [phone]  Readmission Risk Interventions Readmission Risk Prevention Plan 10/09/2019  Post Dischage Appt Not Complete   Appt Comments rec for SNF  Medication Screening Complete  Transportation Screening Complete  Some recent data might be hidden

## 2019-10-09 NOTE — Evaluation (Signed)
Clinical/Bedside Swallow Evaluation Patient Details  Name: [name] MRN: [id number] Date of Birth: Oct 26, 1940  Today's Date: 10/09/2019 Time: SLP Start Time (ACUTE ONLY): 1132 SLP Stop Time (ACUTE ONLY): 1150 SLP Time Calculation (min) (ACUTE ONLY): 18 min  Past Medical History:  Past Medical History:  Diagnosis Date  . Arthritis   . Depression   . High cholesterol   . Hypertension   . Pneumonia   . Stroke [facility])    1994 no residuals   Past Surgical History:  Past Surgical History:  Procedure Laterality Date  . ABDOMINAL HYSTERECTOMY    . catheterization of the left superficial femoral artery  10/20/2009  . SHOULDER ARTHROSCOPY WITH ROTATOR CUFF REPAIR AND SUBACROMIAL DECOMPRESSION Left 01/27/2018   Procedure: LEFT SHOULDER ARTHROSCOPY WITH DEBRIDEMENT, SUBACROMIAL DECOMPRESSION AND ROTATOR CUFF REPAIR;  Surgeon: Kathryne Hitch, MD;  Location: [facility] ORS;  Service: Orthopedics;  Laterality: Left;  . SHOULDER SURGERY     Left with dr. Gayla Doss arthroscopy with possible rotator cuff repair  . TONSILLECTOMY     HPI:  Pt is a 79 y.o. female with PMH significant for HTN, HLD, prior history of stroke in 1994 with no residual deficits who presents with about 4-day history of left-sided weakness involving the left face and left arm. Pt found to have R caudate ischemic stroke.  Pt reports h/o dysphagia to liquids more than solids causing her to cough at times.  She states this is a chronic issue but denies recurrent pneumonias nor requiring heimlich manuever.  Pt has h/o multiple old small vessel infarcts per imaging including in areas of brainstem, cerebellum and periventricular white matter.  Pt noted to have 50% stenosis of Right internal carotid artery.  She passed the Yale swallow screen yesterday.  Per order, pt was noted to lose liquids bilateral from mouth and SLP eval was ordered.   Assessment / Plan / Recommendation Clinical Impression  Patient presents with cranial nerve  deficits including but not limited to facial, trigeminal and hypoglossal nerves.  Deficits result in decreased labial closure and lingual thrusting with clinical appearance of delay and multiple swallows with each bolus provided.  Consistencies provided included ice chips via tsp, nectar thick juice via tsp, cup and small applesauce boluses x3. Multiple swallows with each bolus noted and pt confirming sensation of oral and pharyngeal residuals that required several dry swallows to abate.    SLP did not conduct the 3 ounce Yale water test given pt's report of premorbid coughing with liquids more than solids and her current weakness.    Although no overt indication of aspiration apparent, given cva diagnosis and premorbid dysphagia, recommend to proceed with MBS prior to po offering.    Requested RN to set up oral suction for use and provide needed medications with a small amount of applesauce and offer single ice chips for comfort pending MBS.  Pt informed of MBS planned and clinical reasoning for it and she reported agreement.  SLP Visit Diagnosis: Dysphagia, unspecified (R13.10) (suspect oropharyngeal)    Aspiration Risk  Mild aspiration risk    Diet Recommendation NPO except meds;Ice chips PRN after oral care        Other  Recommendations Other Recommendations: Have oral suction available   Follow up Recommendations Other (comment) (TBD)      Frequency and Duration     tbd       Prognosis   tbd     Swallow Study   General HPI: Pt is a 79  y.o. female with PMH significant for HTN, HLD, prior history of stroke in 1994 with no residual deficits who presents with about 4-day history of left-sided weakness involving the left face and left arm. Pt found to have R caudate ischemic stroke.  Pt reports h/o dysphagia to liquids more than solids causing her to cough at times.  She states this is a chronic issue but denies recurrent pneumonias nor requiring heimlich manuever.  Pt has h/o  multiple old small vessel infarcts per imaging including in areas of brainstem, cerebellum and periventricular white matter.  Pt noted to have 50% stenosis of Right internal carotid artery.  She passed the Yale swallow screen yesterday.  Per order, pt was noted to lose liquids bilateral from mouth and SLP eval was ordered. Type of Study: Bedside Swallow Evaluation Temperature Spikes Noted: No History of Recent Intubation: No Behavior/Cognition: Alert;Cooperative Oral Cavity Assessment: Within Functional Limits Oral Care Completed by SLP: No Oral Cavity - Dentition: Dentures, top;Other (Comment) (lower denture missing) Self-Feeding Abilities: Other (Comment) (SLP assisted for efficiency of testing given ECHO waiting) Patient Positioning: Upright in bed Baseline Vocal Quality: Low vocal intensity Volitional Cough: Weak Volitional Swallow: Unable to elicit    Oral/Motor/Sensory Function Overall Oral Motor/Sensory Function: Mild impairment Facial ROM: Reduced left;Suspected CN VII (facial) dysfunction Facial Symmetry: Abnormal symmetry left;Suspected CN VII (facial) dysfunction Facial Strength: Reduced left;Suspected CN VII (facial) dysfunction Facial Sensation: Reduced left;Suspected CN V (Trigeminal) dysfunction Lingual ROM: Reduced left;Suspected CN XII (hypoglossal) dysfunction Lingual Symmetry: Within Functional Limits Lingual Strength: Reduced;Suspected CN XII (hypoglossal) dysfunction Lingual Sensation:  (dnt) Velum:  (bilateral)   Ice Chips Ice chips: Impaired Presentation: Spoon Oral Phase Impairments: Reduced labial seal;Reduced lingual movement/coordination Pharyngeal Phase Impairments: Multiple swallows;Suspected delayed Swallow   Thin Liquid Thin Liquid: Not tested    Nectar Thick Nectar Thick Liquid: Impaired Presentation: Cup;Self Fed;Spoon Oral Phase Impairments: Reduced lingual movement/coordination;Reduced labial seal Pharyngeal Phase Impairments: Suspected delayed  Swallow Other Comments: lingual thrusting present presumed due to hypoglossal and facial nerve deficits   Honey Thick Honey Thick Liquid: Not tested   Puree Puree: Impaired Presentation: Spoon Oral Phase Impairments: Reduced lingual movement/coordination;Reduced labial seal Pharyngeal Phase Impairments: Suspected delayed Swallow;Multiple swallows Other Comments: lingual thrusting present presumed due to hypoglossal and facial nerve deficits   Solid     Solid: Not tested      [name] 10/09/2019,1:48 PM  [name], MS [facility] SLP Acute Rehab Services Office [phone]

## 2019-10-09 NOTE — Progress Notes (Signed)
Echocardiogram 2D Echocardiogram has been performed.  [name] 10/09/2019, 11:54 AM

## 2019-10-09 NOTE — Evaluation (Signed)
Physical Therapy Evaluation Patient Details Name: [name] MRN: [id number] DOB: 02/16/1941 Today's Date: 10/09/2019   History of Present Illness  [name] is a 79 y.o. female with PMH significant for HTN, HLD, prior history of stroke in 1994 with no residual deficits who presents with about 4-day history of left-sided weakness involving the left face and left arm. Pt found to have R caudate ischemic stroke.  Clinical Impression  Pt was seen for mobility on RW, using support on LUE and assisting to shift the walker.  Pt is surprisingly good and catching her balance, and shows promise to recommend to CIR for recovery of strength and balance.  Follow acutely for same, to work to reduce the time needed to stay in rehab before going home.    Follow Up Recommendations CIR    Equipment Recommendations  None recommended by PT    Recommendations for Other Services Rehab consult     Precautions / Restrictions Precautions Precautions: Fall;Other (comment) Precaution Comments: L hemi and inattention Restrictions Weight Bearing Restrictions: No Other Position/Activity Restrictions: L hand grip is weak      Mobility  Bed Mobility Overal bed mobility: Needs Assistance Bed Mobility: Supine to Sit;Sit to Supine     Supine to sit: Max assist Sit to supine: Max assist   General bed mobility comments: max with trendelenburg to scoot up the bed  Transfers Overall transfer level: Needs assistance Equipment used: Rolling walker (2 wheeled) Transfers: Sit to/from Stand Sit to Stand: Mod assist         General transfer comment: mod to control power up and to help manage the LUE to grasp walker  Ambulation/Gait Ambulation/Gait assistance: Mod assist Gait Distance (Feet): 8 Feet Assistive device: Rolling walker (2 wheeled);1 person hand held assist Gait Pattern/deviations: Step-to pattern;Decreased stride length;Wide base of support Gait velocity: reduced Gait velocity  interpretation: <1.8 ft/sec, indicate of risk for recurrent falls General Gait Details: pt is slow to sidestep, struggling to lift and move LLE  Stairs            Wheelchair Mobility    Modified Rankin (Stroke Patients Only) Modified Rankin (Stroke Patients Only) Pre-Morbid Rankin Score: No significant disability Modified Rankin: Moderately severe disability     Balance Overall balance assessment: Needs assistance Sitting-balance support: Feet supported Sitting balance-Leahy Scale: Fair     Standing balance support: Bilateral upper extremity supported Standing balance-Leahy Scale: Poor Standing balance comment: Supported LUE with RUE on walker                             Pertinent Vitals/Pain Pain Assessment: No/denies pain    Home Living Family/patient expects to be discharged to:: Private residence Living Arrangements: Alone Available Help at Discharge: Family;Available PRN/intermittently Type of Home: Apartment Home Access: Level entry     Home Layout: One level Home Equipment: Grab bars - tub/shower;Cane - single point Additional Comments: pt states she cannot use a RW    Prior Function Level of Independence: Independent;Independent with assistive device(s)         Comments: Was driving and home with ability to take care of herself      Hand Dominance   Dominant Hand: Right    Extremity/Trunk Assessment   Upper Extremity Assessment Upper Extremity Assessment: Defer to OT evaluation    Lower Extremity Assessment Lower Extremity Assessment: Generalized weakness    Cervical / Trunk Assessment Cervical / Trunk Assessment: Normal  Communication   Communication: Expressive difficulties (trouble with clear speech and word finding)  Cognition Arousal/Alertness: Awake/alert Behavior During Therapy: Flat affect Overall Cognitive Status: No family/caregiver present to determine baseline cognitive functioning Area of Impairment:  Awareness;Safety/judgement;Following commands;Problem solving                 Orientation Level: Time;Situation Current Attention Level: Sustained   Following Commands: Follows one step commands inconsistently;Follows one step commands with increased time Safety/Judgement: Decreased awareness of safety;Decreased awareness of deficits Awareness: Intellectual Problem Solving: Slow processing;Decreased initiation;Difficulty sequencing;Requires verbal cues;Requires tactile cues General Comments: Pt follows instructions but cannot control LUE      General Comments General comments (skin integrity, edema, etc.): pt is having struggle to get LUE supporting on walker due to grip but with help can support a little with LUE    Exercises     Assessment/Plan    PT Assessment    PT Problem List         PT Treatment Interventions      PT Goals (Current goals can be found in the Care Plan section)  Acute Rehab PT Goals Patient Stated Goal: recover independence, gait PT Goal Formulation: With patient Time For Goal Achievement: 10/23/19 Potential to Achieve Goals: Good    Frequency Min 5X/week   Barriers to discharge        Co-evaluation               AM-PAC PT "6 Clicks" Mobility  Outcome Measure Help needed turning from your back to your side while in a flat bed without using bedrails?: A Little Help needed moving from lying on your back to sitting on the side of a flat bed without using bedrails?: A Lot Help needed moving to and from a bed to a chair (including a wheelchair)?: A Lot Help needed standing up from a chair using your arms (e.g., wheelchair or bedside chair)?: A Lot Help needed to walk in hospital room?: A Lot Help needed climbing 3-5 steps with a railing? : Total 6 Click Score: 12    End of Session Equipment Utilized During Treatment: Gait belt Activity Tolerance: Patient tolerated treatment well;Treatment limited secondary to medical complications  (Comment) Patient left: in bed;with call bell/phone within reach;with bed alarm set Nurse Communication: Mobility status PT Visit Diagnosis: Unsteadiness on feet (R26.81);Muscle weakness (generalized) (M62.81);Hemiplegia and hemiparesis Hemiplegia - Right/Left: Left Hemiplegia - dominant/non-dominant: Non-dominant Hemiplegia - caused by: Cerebral infarction    Time: 2334-3568 PT Time Calculation (min) (ACUTE ONLY): 34 min   Charges:   PT Evaluation $PT Eval Moderate Complexity: 1 Mod PT Treatments $Gait Training: 8-22 mins       Ivar Drape 10/09/2019, 8:58 PM  Samul Dada, PT MS Acute Rehab Dept. Number: [facility] [id number] and [facility] [phone]

## 2019-10-09 NOTE — Progress Notes (Signed)
Carotid duplex bilateral study completed.   Please see CV Proc for preliminary results.   Lucky Trotta  

## 2019-10-09 NOTE — Evaluation (Signed)
Occupational Therapy Evaluation Patient Details Name: [name] MRN: [id number] DOB: 10-Aug-1940 Today's Date: 10/09/2019    History of Present Illness [name] is a 79 y.o. female with PMH significant for HTN, HLD, prior history of stroke in 1994 with no residual deficits who presents with about 4-day history of left-sided weakness involving the left face and left arm. Pt found to have R caudate ischemic stroke.   Clinical Impression   PTA, pt lives alone and reports Independence with ADLs, IADLs and mobility. Pt reports started using cane prior to admission due to weakness. Pt presents now s/p CVA with deficits in strength, coordination, perception, balance, and endurance. Pt with L sided inattention, but with cues able to scan and attend to L side. Pt overall Max A x 1 for bed mobility and sit to stand transfer. Pt with R/L discrimination difficulty, so unable to demonstrate successful side steps up to [facility] (L side) today. Pt requires Mod A for UB ADLs and Max A for LB ADLs. Recommend SNF for short term rehab based on current functional abilities.     Follow Up Recommendations  SNF;Supervision/Assistance - 24 hour    Equipment Recommendations  Other (comment) (TBD)    Recommendations for Other Services       Precautions / Restrictions Precautions Precautions: Fall;Other (comment) Precaution Comments: L inattention Restrictions Weight Bearing Restrictions: No      Mobility Bed Mobility Overal bed mobility: Needs Assistance Bed Mobility: Supine to Sit;Sit to Supine     Supine to sit: Max assist;HOB elevated Sit to supine: Mod assist;HOB elevated   General bed mobility comments: Max A to sit EOB with frequent cueing needed for sequencing, Mod A to return to supine  Transfers Overall transfer level: Needs assistance Equipment used: None (No RW in room, unsure if pt would be able to grasp RW) Transfers: Sit to/from Stand Sit to Stand: Max assist         General  transfer comment: Max A for sit to stand without AD, requiring assist to maintain balance. Attempted to side step up to [facility] (L side), but pt attempting to go to R side with difficulty intiating and picking up feet. Pt demo ability to laterally scoot to [facility] with Min A    Balance Overall balance assessment: Needs assistance;History of Falls Sitting-balance support: Bilateral upper extremity supported;Feet supported Sitting balance-Leahy Scale: Fair Sitting balance - Comments: static sitting fair, with movement, requiring min guard to Min A to maintain    Standing balance support: No upper extremity supported;During functional activity Standing balance-Leahy Scale: Poor Standing balance comment: requires external support from therapist to maintain balance.                           ADL either performed or assessed with clinical judgement   ADL Overall ADL's : Needs assistance/impaired Eating/Feeding: Minimal assistance;Sitting   Grooming: Minimal assistance;Sitting Grooming Details (indicate cue type and reason): Supervision to wash face bed level, suspect Min A needed for multi-step tasks (brushing teeth) Upper Body Bathing: Moderate assistance;Sitting   Lower Body Bathing: Maximal assistance;Bed level;Sit to/from stand   Upper Body Dressing : Moderate assistance;Sitting   Lower Body Dressing: Maximal assistance;Sit to/from stand;Bed level       Toileting- Clothing Manipulation and Hygiene: Maximal assistance;Sit to/from stand         General ADL Comments: Pt with decreased L UE strength, L inattention, cognitive deficits, expressive difficulties, decreased sitting/standing balance, and  decreased endurance      Vision Baseline Vision/History: Wears glasses Wears Glasses: At all times (lost her glasses) Patient Visual Report: Blurring of vision Vision Assessment?: Vision impaired- to be further tested in functional context Additional Comments: Preference for R sided  gaze, though able to scan to L with cues. Difficulty focusing gaze - unsure if new or worsened blurring without glasses     Perception Perception Perception Tested?: Yes Perception Deficits: Inattention/neglect Inattention/Neglect: Impaired- to be further tested in functional context Comments: Pt with L sided inattention, preference for R sided gaze, difficulty with R/L discrimination   Praxis Praxis Praxis tested?: Deficits Deficits: Initiation;Organization;Limb apraxia    Pertinent Vitals/Pain Pain Assessment: 0-10 Pain Score: 6  Pain Location: back Pain Descriptors / Indicators: Aching;Discomfort;Grimacing Pain Intervention(s): Monitored during session;Patient requesting pain meds-RN notified     Hand Dominance Right   Extremity/Trunk Assessment Upper Extremity Assessment Upper Extremity Assessment: LUE deficits/detail LUE Deficits / Details: grossly 2-/5 throughout UE, able to make fist, but weak grasp. Difficulty actively extending/flexing elbow, 1/5 shoulder strength.  LUE Sensation: decreased light touch (reports numbness) LUE Coordination: decreased fine motor;decreased gross motor   Lower Extremity Assessment Lower Extremity Assessment: Defer to PT evaluation   Cervical / Trunk Assessment Cervical / Trunk Assessment: Normal   Communication Communication Communication: Expressive difficulties;HOH (word finding difficulties)   Cognition Arousal/Alertness: Awake/alert Behavior During Therapy: Flat affect Overall Cognitive Status: No family/caregiver present to determine baseline cognitive functioning Area of Impairment: Orientation;Attention;Following commands;Safety/judgement;Awareness;Problem solving                 Orientation Level: Time;Situation Current Attention Level: Sustained   Following Commands: Follows one step commands with increased time Safety/Judgement: Decreased awareness of safety;Decreased awareness of deficits   Problem Solving: Slow  processing;Decreased initiation;Difficulty sequencing;Requires verbal cues;Requires tactile cues General Comments: Pt A&Ox2. Pt with slow processing and delayed initiation during tasks, requiring repetition of cues for follow through.     General Comments       Exercises     Shoulder Instructions      Home Living Family/patient expects to be discharged to:: Private residence Living Arrangements: Alone Available Help at Discharge: Family;Available PRN/intermittently Type of Home: Apartment Home Access: Level entry     Home Layout: One level     Bathroom Shower/Tub: Chief Strategy Officer: Handicapped height (has toilet riser over regular toilet)     Home Equipment: Grab bars - tub/shower;Cane - single point   Additional Comments: Per chart, pt has a RW and was using it but pt denies having a RW.       Prior Functioning/Environment Level of Independence: Independent;Independent with assistive device(s)        Comments: Pt reports typically mobilizing without AD, though started using her cane a few days prior to admission. Pt reports Independent with ADLs, IADLs (laundry, microwave meals), was still driving        OT Problem List: Decreased strength;Decreased range of motion;Decreased activity tolerance;Impaired balance (sitting and/or standing);Impaired vision/perception;Decreased coordination;Decreased cognition;Decreased knowledge of use of DME or AE;Decreased safety awareness;Impaired UE functional use      OT Treatment/Interventions: Self-care/ADL training;Therapeutic exercise;Energy conservation;DME and/or AE instruction;Therapeutic activities;Patient/family education    OT Goals(Current goals can be found in the care plan section) Acute Rehab OT Goals Patient Stated Goal: agreeable to return to independence OT Goal Formulation: With patient Time For Goal Achievement: 10/23/19 Potential to Achieve Goals: Good ADL Goals Pt Will Perform Eating: with  set-up;sitting Pt Will Perform  Grooming: with min guard assist;standing Pt Will Perform Lower Body Bathing: with min assist;sitting/lateral leans;sit to/from stand Pt Will Perform Upper Body Dressing: with supervision;sitting Pt Will Transfer to Toilet: with min assist;stand pivot transfer;bedside commode Pt Will Perform Toileting - Clothing Manipulation and hygiene: with min assist;sitting/lateral leans;sit to/from stand  OT Frequency: Min 2X/week   Barriers to D/C:            Co-evaluation              AM-PAC OT "6 Clicks" Daily Activity     Outcome Measure Help from another person eating meals?: A Little Help from another person taking care of personal grooming?: A Little Help from another person toileting, which includes using toliet, bedpan, or urinal?: Total Help from another person bathing (including washing, rinsing, drying)?: A Lot Help from another person to put on and taking off regular upper body clothing?: A Lot Help from another person to put on and taking off regular lower body clothing?: A Lot 6 Click Score: 13   End of Session Equipment Utilized During Treatment: Gait belt Nurse Communication: Mobility status;Patient requests pain meds  Activity Tolerance: Patient limited by fatigue Patient left: in bed;with call bell/phone within reach;with bed alarm set  OT Visit Diagnosis: Unsteadiness on feet (R26.81);Other abnormalities of gait and mobility (R26.89);Muscle weakness (generalized) (M62.81);History of falling (Z91.81);Ataxia, unspecified (R27.0);Apraxia (R48.2);Other symptoms and signs involving cognitive function;Hemiplegia and hemiparesis Hemiplegia - Right/Left: Left Hemiplegia - dominant/non-dominant: Non-Dominant Hemiplegia - caused by: Cerebral infarction                Time: 2440-1027 OT Time Calculation (min): 24 min Charges:  OT General Charges $OT Visit: 1 Visit OT Evaluation $OT Eval High Complexity: 1 High OT Treatments $Therapeutic  Activity: 8-22 mins  Lorre Munroe, OTR/L  Lorre Munroe 10/09/2019, 8:25 AM

## 2019-10-09 NOTE — Progress Notes (Signed)
Modified Barium Swallow Progress Note  Patient Details  Name: [name] MRN: [id number] Date of Birth: 02-18-1941  Today's Date: 10/09/2019  Modified Barium Swallow completed.  Full report located under Chart Review in the Imaging Section.  Brief recommendations include the following:  Clinical Impression  Pt presents with oropharyngeal dysphagia characterized by reduced labial seal, impaired mastication, impaired bolus formation, impaired A-P transport, and a pharyngeal delay. She demonstrated mild anterior spillage with thin liquids, reduced labial stripping, moderately-severely prolonged mastication with ultimate removal of mechanical soft boluses, piecemeal deglutition, and lingual pumping. The swallow was often triggered with the head of the liquid boluses at the valleculae and pyriform sinuses. However, penetration was only observed once when pt attempted to swallow a pill with thin liquids and no instances of aspiration were demonstrated. Pt was unable to propel the barium tablet posteriorly and swallow it whole with liquids or puree boluses. A dysphagia 1 (puree) diet with thin liquids is recommended at this time. SLP will follow pt for dysphagia treatment.    Swallow Evaluation Recommendations       SLP Diet Recommendations: Dysphagia 1 (Puree) solids;Thin liquid   Liquid Administration via: Cup;Straw   Medication Administration: Crushed with puree   Supervision: Staff to assist with self feeding   Compensations: Slow rate;Small sips/bites   Postural Changes: Seated upright at 90 degrees   Oral Care Recommendations: Oral care BID   Other Recommendations: Have oral suction available   Crescencio Jozwiak I. Vear Clock, MS, CCC-SLP Acute Rehabilitation Services Office number [phone] Pager [phone]  Scheryl Marten 10/09/2019,2:58 PM

## 2019-10-09 NOTE — Evaluation (Signed)
SLP Cancellation Note  Patient Details Name: [name] MRN: [id number] DOB: 1940-10-28   Cancelled treatment:       Reason Eval/Treat Not Completed: Other (comment) (pt with other therapy at this time, will continue efforts)   [name] 10/09/2019, 7:59 AM   [name], MS [facility] SLP Acute Rehab Services Office [phone]

## 2019-10-09 NOTE — Social Work (Signed)
To Whom it May Concern, Please be advised that the above named patient will require a short term nursing home stay. Anticipated to be 30 days or less for rehabilitation and strengthening. The plan is for return home.    [name] Jan 08, 1941

## 2019-10-10 LAB — CBC WITH DIFFERENTIAL/PLATELET
Abs Immature Granulocytes: 0.01 10*3/uL (ref 0.00–0.07)
Basophils Absolute: 0.1 10*3/uL (ref 0.0–0.1)
Basophils Relative: 1 %
Eosinophils Absolute: 0.1 10*3/uL (ref 0.0–0.5)
Eosinophils Relative: 1 %
HCT: 42.5 % (ref 36.0–46.0)
Hemoglobin: 12.9 g/dL (ref 12.0–15.0)
Immature Granulocytes: 0 %
Lymphocytes Relative: 35 %
Lymphs Abs: 1.3 10*3/uL (ref 0.7–4.0)
MCH: 25.3 pg — ABNORMAL LOW (ref 26.0–34.0)
MCHC: 30.4 g/dL (ref 30.0–36.0)
MCV: 83.3 fL (ref 80.0–100.0)
Monocytes Absolute: 0.4 10*3/uL (ref 0.1–1.0)
Monocytes Relative: 11 %
Neutro Abs: 1.9 10*3/uL (ref 1.7–7.7)
Neutrophils Relative %: 52 %
Platelets: 228 10*3/uL (ref 150–400)
RBC: 5.1 MIL/uL (ref 3.87–5.11)
RDW: 14.3 % (ref 11.5–15.5)
WBC: 3.8 10*3/uL — ABNORMAL LOW (ref 4.0–10.5)
nRBC: 0 % (ref 0.0–0.2)

## 2019-10-10 LAB — BASIC METABOLIC PANEL
Anion gap: 8 (ref 5–15)
BUN: 6 mg/dL — ABNORMAL LOW (ref 8–23)
CO2: 25 mmol/L (ref 22–32)
Calcium: 9.6 mg/dL (ref 8.9–10.3)
Chloride: 105 mmol/L (ref 98–111)
Creatinine, Ser: 0.8 mg/dL (ref 0.44–1.00)
GFR calc Af Amer: >60 mL/min (ref >60)
GFR calc non Af Amer: >60 mL/min (ref >60)
Glucose, Bld: 91 mg/dL (ref 70–99)
Potassium: 3.6 mmol/L (ref 3.5–5.1)
Sodium: 138 mmol/L (ref 135–145)

## 2019-10-10 MED ORDER — ASPIRIN EC 81 MG PO TBEC
81.0000 mg | DELAYED_RELEASE_TABLET | Freq: Every day | ORAL | Status: DC
  Administered 2019-10-11 – 2019-10-12 (×2): 81 mg via ORAL
  Filled 2019-10-10 (×2): qty 1

## 2019-10-10 MED ORDER — CLOPIDOGREL BISULFATE 75 MG PO TABS
75.0000 mg | ORAL_TABLET | Freq: Every day | ORAL | Status: DC
  Administered 2019-10-10 – 2019-10-12 (×3): 75 mg via ORAL
  Filled 2019-10-10 (×3): qty 1

## 2019-10-10 NOTE — Progress Notes (Signed)
PROGRESS NOTE    [name]  ZOX:[id number] DOB: 04/15/1940 DOA: 10/08/2019 PCP: Martha Clan, MD    Brief Narrative:  [name] is an 79 y.o. female with past medical history of reported stroke in 1994, obesity, hypertension, dyslipidemia,PAD,depression, ongoing smoking, GERD who lives at home by herself with her sister living about 4 blocks away comes in with chief complaints of a fall which occurred 4 days prior to ED presentation.  After the fall she started noticing left arm weakness and some left-sided facial weakness, she also felt quite weak, initially she stayed on the floor for a few hours then got herself in, she also tried to go on with life and drove for couple of days but continued to have left-sided weakness mostly in her left upper extremity and face. She noticed that water was drooling down the left side of her face at times.   Work-up in the ED was noted to have found acute CVA, neurology was consulted and [facility] consulted for admission for further evaluation work-up.   Assessment & Plan:   Principal Problem:   Acute ischemic stroke (HCC) Active Problems:   HYPERCHOLESTEROLEMIA   OBESITY, NOS   TOBACCO DEPENDENCE   HYPERTENSION, BENIGN SYSTEMIC   PVD (peripheral vascular disease) with claudication (HCC)   Acute CVA right corona radiata Patient presenting to ED following a fall 4 days prior with associated left upper/lower extremity weakness and drooling from mouth.  CT head with no acute intracranial findings.  MR brain with acute infarct right caudate body.  MRA head/neck with no large vessel occlusion but does note 50% stenosis proximal right ICA and 4 mm anterior communicating artery aneurysm.  Carotid ultrasound with left/right ICA stenosis 40-59%.  TTE with LVEF 65-70%, mild LVH, grade 1 diastolic dysfunction, LA moderately dilated, ensure arterial septum aneurysm.  Total cholesterol 158, HDL 50, LDL 94.  Hemoglobin A1c 5.4. --Neurology following, appreciate  assistance --Dual antiplatelet therapy Plavix 75 mg PO daily and aspirin 81 mg p.o. daily x 3 weeks; followed by Plavix alone --Atorvastatin 40 mg p.o. daily --SLP w/ recs of dysphagia 1 diet, pured solids with thin liquids, medications crushed --PT/OT following, recommend SNF placement  Depression --Bupropion 150 mg p.o. every morning and Lexapro 10 mg p.o. nightly  GERD: Continue Protonix 20 mg p.o. daily  Hx essential hypertension On hydralazine 25 mg p.o. 3 times daily, amlodipine 10 mg p.o. daily, lisinopril 40 mg p.o. daily, HCTZ 25 mg p.o. daily at home. --BP 161/85 this morning, allowing permissive hypertension in the setting of acute CVA; okay to normalize in 4-6 days --Hydralazine 10 mg IV every 6 hours as needed SBP greater than 180   DVT prophylaxis: Heparin Code Status: Full code Family Communication: No family present at bedside this morning  Disposition Plan:  Status is: Inpatient  Remains inpatient appropriate because:Ongoing diagnostic testing needed not appropriate for outpatient work up, Unsafe d/c plan and Inpatient level of care appropriate due to severity of illness   Dispo: The patient is from: Home              Anticipated d/c is to: SNF              Anticipated d/c date is: 1 day              Patient currently is medically stable to d/c.   Consultants:   Neurology  Procedures:   TTE -LVEF 65-70%, mild LVH, grade 1 diastolic dysfunction, moderate LA dilation, intra-arterial  septum aneurysm  Antimicrobials:   None   Subjective: Patient seen and examined at bedside, resting comfortably.  Continues with some dysarthria and left upper/lower extremity weakness.  Patient's review SNF offers with family this evening.  No other complaints or concerns at this time.  Denies headache, no visual changes, no chest pain, no palpitations, no abdominal pain.  No acute events overnight per nursing staff.  Objective: Vitals:   10/09/19 2355 10/10/19 0356  10/10/19 0858 10/10/19 1216  BP: (!) 170/104 (!) 161/85 (!) 150/104 (!) 153/83  Pulse: 63 63 69 66  Resp: 18 18 16 18   Temp: 98.2 F (36.8 C) 98.3 F (36.8 C)  98 F (36.7 C)  TempSrc: Oral Oral  Oral  SpO2: 99% 97% 98% 96%  Weight:        Intake/Output Summary (Last 24 hours) at 10/10/2019 1534 Last data filed at 10/10/2019 0647 Gross per 24 hour  Intake 120 ml  Output 150 ml  Net -30 ml   Filed Weights   10/08/19 2237  Weight: 74.5 kg    Examination:  General exam: Appears calm and comfortable, chronically ill in appearance Respiratory system: Clear to auscultation. Respiratory effort normal.  Oxygenating well on room air. Cardiovascular system: S1 & S2 heard, RRR. No JVD, murmurs, rubs, gallops or clicks. No pedal edema. Gastrointestinal system: Abdomen is nondistended, soft and nontender. No organomegaly or masses felt. Normal bowel sounds heard. Central nervous system: Alert and oriented. No focal neurological deficits. Extremities: Left upper/lower extremity muscle strength 4/5 otherwise globally intact throughout Skin: No rashes, lesions or ulcers Psychiatry: Judgement and insight appear normal. Mood & affect appropriate.     Data Reviewed: I have personally reviewed following labs and imaging studies  CBC: Recent Labs  Lab 10/08/19 1559 10/08/19 2026 10/09/19 0311 10/10/19 0739  WBC 4.0 3.6* 3.2* 3.8*  NEUTROABS 2.0  --  1.5* 1.9  HGB 12.2 10.5* 12.4 12.9  HCT 40.6 34.6* 40.8 42.5  MCV 87.1 87.6 83.4 83.3  PLT 226 176 222 228   Basic Metabolic Panel: Recent Labs  Lab 10/08/19 1559 10/08/19 2026 10/09/19 0311 10/10/19 0739  NA 139  --  140 138  K 3.4*  --  3.4* 3.6  CL 104  --  105 105  CO2 25  --  25 25  GLUCOSE 117*  --  90 91  BUN 14  --  10 6*  CREATININE 1.05* 0.80 0.74 0.80  CALCIUM 9.4  --  9.5 9.6  MG  --   --  2.0  --    GFR: CrCl cannot be calculated (Unknown ideal weight.). Liver Function Tests: Recent Labs  Lab 10/08/19 1559   AST 17  ALT 8  ALKPHOS 43  BILITOT 1.0  PROT 6.8  ALBUMIN 3.8   No results for input(s): LIPASE, AMYLASE in the last 168 hours. No results for input(s): AMMONIA in the last 168 hours. Coagulation Profile: Recent Labs  Lab 10/08/19 1559  INR 1.1   Cardiac Enzymes: Recent Labs  Lab 10/08/19 2026  CKTOTAL 278*   BNP (last 3 results) No results for input(s): PROBNP in the last 8760 hours. HbA1C: Recent Labs    10/09/19 0311  HGBA1C 5.4   CBG: No results for input(s): GLUCAP in the last 168 hours. Lipid Profile: Recent Labs    10/09/19 0311  CHOL 158  HDL 50  LDLCALC 94  TRIG 71  CHOLHDL 3.2   Thyroid Function Tests: No results for input(s): TSH, T4TOTAL,  FREET4, T3FREE, THYROIDAB in the last 72 hours. Anemia Panel: No results for input(s): VITAMINB12, FOLATE, FERRITIN, TIBC, IRON, RETICCTPCT in the last 72 hours. Sepsis Labs: No results for input(s): PROCALCITON, LATICACIDVEN in the last 168 hours.  Recent Results (from the past 240 hour(s))  SARS Coronavirus 2 by RT PCR (hospital order, performed in [facility] hospital lab) Nasopharyngeal Nasopharyngeal Swab     Status: None   Collection Time: 10/08/19  5:44 PM   Specimen: Nasopharyngeal Swab  Result Value Ref Range Status   SARS Coronavirus 2 NEGATIVE NEGATIVE Final    Comment: (NOTE) SARS-CoV-2 target nucleic acids are NOT DETECTED.  The SARS-CoV-2 RNA is generally detectable in upper and lower respiratory specimens during the acute phase of infection. The lowest concentration of SARS-CoV-2 viral copies this assay can detect is 250 copies / mL. A negative result does not preclude SARS-CoV-2 infection and should not be used as the sole basis for treatment or other patient management decisions.  A negative result may occur with improper specimen collection / handling, submission of specimen other than nasopharyngeal swab, presence of viral mutation(s) within the areas targeted by this assay, and  inadequate number of viral copies (<250 copies / mL). A negative result must be combined with clinical observations, patient history, and epidemiological information.  Fact Sheet for Patients:   BoilerBrush.com.cy  Fact Sheet for Healthcare Providers: https://pope.com/  This test is not yet approved or  cleared by the [place] FDA and has been authorized for detection and/or diagnosis of SARS-CoV-2 by FDA under an Emergency Use Authorization (EUA).  This EUA will remain in effect (meaning this test can be used) for the duration of the COVID-19 declaration under Section 564(b)(1) of the Act, 21 U.S.C. section 360bbb-3(b)(1), unless the authorization is terminated or revoked sooner.  Performed at [facility], [address], [place], [place] [address]          Radiology Studies: CT HEAD WO CONTRAST  Result Date: 10/08/2019 CLINICAL DATA:  Larey Seat 4 days ago, left arm weakness, left-sided facial droop EXAM: CT HEAD WITHOUT CONTRAST TECHNIQUE: Contiguous axial images were obtained from the base of the skull through the vertex without intravenous contrast. COMPARISON:  10/21/2018 FINDINGS: Brain: Hypodensities are seen throughout the bilateral basal ganglia and periventricular white matter, grossly stable since prior study and compatible with chronic small vessel ischemic changes. Evidence of prior right occipital infarct. No evidence of acute infarct or hemorrhage. The lateral ventricles and midline structures are otherwise unremarkable. No acute extra-axial fluid collections. No mass effect. Vascular: No hyperdense vessel or unexpected calcification. Skull: Normal. Negative for fracture or focal lesion. Sinuses/Orbits: Stable polypoid mucosal thickening right maxillary sinus. Remaining sinuses are clear. Other: None. IMPRESSION: 1. Stable chronic ischemic changes as above. No acute intracranial process. Electronically Signed   By:  Sharlet Salina M.D.   On: 10/08/2019 17:47   MR ANGIO HEAD WO CONTRAST  Result Date: 10/08/2019 CLINICAL DATA:  Stroke, follow up; Neuro deficit, acute, stroke suspected EXAM: MRI HEAD WITHOUT CONTRAST MRA HEAD WITHOUT CONTRAST MRA NECK WITHOUT CONTRAST TECHNIQUE: Multiplanar, multiecho pulse sequences of the brain and surrounding structures were obtained without intravenous contrast. Angiographic images of the Circle of Willis were obtained using MRA technique without intravenous contrast. Angiographic images of the neck were obtained using MRA technique without intravenous contrast. Carotid stenosis measurements (when applicable) are obtained utilizing NASCET criteria, using the distal internal carotid diameter as the denominator. COMPARISON:  None. FINDINGS: MRI HEAD FINDINGS BRAIN:  There is a small acute infarct of the right caudate body. There are multiple old small vessel infarcts of the periventricular white matter, brainstem and cerebellum. There is an old right occipital infarct. Early confluent hyperintense T2-weighted signal of the periventricular and deep white matter, most commonly due to chronic ischemic microangiopathy. Generalized atrophy without lobar predilection. Approximately 10 scattered chronic microhemorrhages. Midline structures are normal. SKULL AND UPPER CERVICAL SPINE: The visualized skull base, calvarium, upper cervical spine and extracranial soft tissues are normal. SINUSES/ORBITS: No fluid levels or advanced mucosal thickening. No mastoid or middle ear effusion. The orbits are normal. MRA HEAD FINDINGS POSTERIOR CIRCULATION: --Basilar artery: Normal. --Posterior cerebral arteries: Normal. Both originate from the basilar artery. --Superior cerebellar arteries: Normal. --Inferior cerebellar arteries: Normal anterior and posterior inferior cerebellar arteries. ANTERIOR CIRCULATION: --Intracranial internal carotid arteries: Normal. --Anterior cerebral arteries: There is a 4 mm anterior  communicating artery aneurysm. Otherwise the anterior cerebral arteries are normal. --Middle cerebral arteries: Normal. --Posterior communicating arteries: Present on the left, absent on the right. MRA NECK FINDINGS Limited MRA of the neck. The imaged portions of these cervical carotid and vertebral arteries are patent. There is 50% stenosis of the proximal right internal carotid artery. IMPRESSION: 1. Small acute infarct of the right caudate body. No hemorrhage or mass effect. 2. Multiple old small vessel infarcts and other sequelae of chronic ischemic microangiopathy. 3. No emergent large vessel occlusion or high-grade stenosis. 4. 50% stenosis of the proximal right internal carotid artery. 5. 4 mm anterior communicating artery aneurysm. Electronically Signed   By: Deatra Robinson M.D.   On: 10/08/2019 20:08   MR ANGIO NECK WO CONTRAST  Result Date: 10/08/2019 CLINICAL DATA:  Stroke, follow up; Neuro deficit, acute, stroke suspected EXAM: MRI HEAD WITHOUT CONTRAST MRA HEAD WITHOUT CONTRAST MRA NECK WITHOUT CONTRAST TECHNIQUE: Multiplanar, multiecho pulse sequences of the brain and surrounding structures were obtained without intravenous contrast. Angiographic images of the Circle of Willis were obtained using MRA technique without intravenous contrast. Angiographic images of the neck were obtained using MRA technique without intravenous contrast. Carotid stenosis measurements (when applicable) are obtained utilizing NASCET criteria, using the distal internal carotid diameter as the denominator. COMPARISON:  None. FINDINGS: MRI HEAD FINDINGS BRAIN: There is a small acute infarct of the right caudate body. There are multiple old small vessel infarcts of the periventricular white matter, brainstem and cerebellum. There is an old right occipital infarct. Early confluent hyperintense T2-weighted signal of the periventricular and deep white matter, most commonly due to chronic ischemic microangiopathy. Generalized  atrophy without lobar predilection. Approximately 10 scattered chronic microhemorrhages. Midline structures are normal. SKULL AND UPPER CERVICAL SPINE: The visualized skull base, calvarium, upper cervical spine and extracranial soft tissues are normal. SINUSES/ORBITS: No fluid levels or advanced mucosal thickening. No mastoid or middle ear effusion. The orbits are normal. MRA HEAD FINDINGS POSTERIOR CIRCULATION: --Basilar artery: Normal. --Posterior cerebral arteries: Normal. Both originate from the basilar artery. --Superior cerebellar arteries: Normal. --Inferior cerebellar arteries: Normal anterior and posterior inferior cerebellar arteries. ANTERIOR CIRCULATION: --Intracranial internal carotid arteries: Normal. --Anterior cerebral arteries: There is a 4 mm anterior communicating artery aneurysm. Otherwise the anterior cerebral arteries are normal. --Middle cerebral arteries: Normal. --Posterior communicating arteries: Present on the left, absent on the right. MRA NECK FINDINGS Limited MRA of the neck. The imaged portions of these cervical carotid and vertebral arteries are patent. There is 50% stenosis of the proximal right internal carotid artery. IMPRESSION: 1. Small acute infarct of the right caudate body.  No hemorrhage or mass effect. 2. Multiple old small vessel infarcts and other sequelae of chronic ischemic microangiopathy. 3. No emergent large vessel occlusion or high-grade stenosis. 4. 50% stenosis of the proximal right internal carotid artery. 5. 4 mm anterior communicating artery aneurysm. Electronically Signed   By: Deatra Robinson M.D.   On: 10/08/2019 20:08   MR BRAIN WO CONTRAST  Result Date: 10/08/2019 CLINICAL DATA:  Stroke, follow up; Neuro deficit, acute, stroke suspected EXAM: MRI HEAD WITHOUT CONTRAST MRA HEAD WITHOUT CONTRAST MRA NECK WITHOUT CONTRAST TECHNIQUE: Multiplanar, multiecho pulse sequences of the brain and surrounding structures were obtained without intravenous contrast.  Angiographic images of the Circle of Willis were obtained using MRA technique without intravenous contrast. Angiographic images of the neck were obtained using MRA technique without intravenous contrast. Carotid stenosis measurements (when applicable) are obtained utilizing NASCET criteria, using the distal internal carotid diameter as the denominator. COMPARISON:  None. FINDINGS: MRI HEAD FINDINGS BRAIN: There is a small acute infarct of the right caudate body. There are multiple old small vessel infarcts of the periventricular white matter, brainstem and cerebellum. There is an old right occipital infarct. Early confluent hyperintense T2-weighted signal of the periventricular and deep white matter, most commonly due to chronic ischemic microangiopathy. Generalized atrophy without lobar predilection. Approximately 10 scattered chronic microhemorrhages. Midline structures are normal. SKULL AND UPPER CERVICAL SPINE: The visualized skull base, calvarium, upper cervical spine and extracranial soft tissues are normal. SINUSES/ORBITS: No fluid levels or advanced mucosal thickening. No mastoid or middle ear effusion. The orbits are normal. MRA HEAD FINDINGS POSTERIOR CIRCULATION: --Basilar artery: Normal. --Posterior cerebral arteries: Normal. Both originate from the basilar artery. --Superior cerebellar arteries: Normal. --Inferior cerebellar arteries: Normal anterior and posterior inferior cerebellar arteries. ANTERIOR CIRCULATION: --Intracranial internal carotid arteries: Normal. --Anterior cerebral arteries: There is a 4 mm anterior communicating artery aneurysm. Otherwise the anterior cerebral arteries are normal. --Middle cerebral arteries: Normal. --Posterior communicating arteries: Present on the left, absent on the right. MRA NECK FINDINGS Limited MRA of the neck. The imaged portions of these cervical carotid and vertebral arteries are patent. There is 50% stenosis of the proximal right internal carotid artery.  IMPRESSION: 1. Small acute infarct of the right caudate body. No hemorrhage or mass effect. 2. Multiple old small vessel infarcts and other sequelae of chronic ischemic microangiopathy. 3. No emergent large vessel occlusion or high-grade stenosis. 4. 50% stenosis of the proximal right internal carotid artery. 5. 4 mm anterior communicating artery aneurysm. Electronically Signed   By: Deatra Robinson M.D.   On: 10/08/2019 20:08   DG Chest Port 1 View  Result Date: 10/08/2019 CLINICAL DATA:  79 year old female with shortness of breath. EXAM: PORTABLE CHEST 1 VIEW COMPARISON:  Chest radiograph dated 12/09/2008 and CT dated 01/14/2019 FINDINGS: The lungs are clear. There is no pleural effusion pneumothorax. Mild cardiomegaly. Atherosclerotic calcification of the aorta. No acute osseous pathology. IMPRESSION: No active disease. Electronically Signed   By: Elgie Collard M.D.   On: 10/08/2019 19:05   DG Swallowing Func-Speech Pathology  Result Date: 10/09/2019 Objective Swallowing Evaluation: Type of Study: MBS-Modified Barium Swallow Study  Patient Details Name: BARBY COLVARD MRN: [id number] Date of Birth: 1940/07/24 Today's Date: 10/09/2019 Time: SLP Start Time (ACUTE ONLY): 1349 -SLP Stop Time (ACUTE ONLY): 1412 SLP Time Calculation (min) (ACUTE ONLY): 23 min Past Medical History: Past Medical History: Diagnosis Date . Arthritis  . Depression  . High cholesterol  . Hypertension  . Pneumonia  . Stroke [facility])  1994 no residuals Past Surgical History: Past Surgical History: Procedure Laterality Date . ABDOMINAL HYSTERECTOMY   . catheterization of the left superficial femoral artery  10/20/2009 . SHOULDER ARTHROSCOPY WITH ROTATOR CUFF REPAIR AND SUBACROMIAL DECOMPRESSION Left 01/27/2018  Procedure: LEFT SHOULDER ARTHROSCOPY WITH DEBRIDEMENT, SUBACROMIAL DECOMPRESSION AND ROTATOR CUFF REPAIR;  Surgeon: Kathryne Hitch, MD;  Location: [facility];  Service: Orthopedics;  Laterality: Left; . SHOULDER SURGERY    Left with  dr. Gayla Doss arthroscopy with possible rotator cuff repair . TONSILLECTOMY   HPI: Pt is a 79 y.o. female with PMH significant for HTN, HLD, prior history of stroke in 1994 with no residual deficits who presents with about 4-day history of left-sided weakness involving the left face and left arm. Pt found to have R caudate ischemic stroke.  Pt reports h/o dysphagia to liquids more than solids causing her to cough at times.  She states this is a chronic issue but denies recurrent pneumonias nor requiring heimlich manuever.  Pt has h/o multiple old small vessel infarcts per imaging including in areas of brainstem, cerebellum and periventricular white matter.  Pt noted to have 50% stenosis of Right internal carotid artery.  She passed the [place] swallow screen yesterday.  Per order, pt was noted to lose liquids bilateral from mouth and SLP eval was ordered.  Subjective: pt awake in bed, ECHO staff at bedside and agreeable to pause test to allow SlP swallow eval completion Assessment / Plan / Recommendation CHL IP CLINICAL IMPRESSIONS 10/09/2019 Clinical Impression Pt presents with oropharyngeal dysphagia characterized by reduced labial seal, impaired mastication, impaired bolus formation, impaired A-P transport, and a pharyngeal delay. She demonstrated mild anterior spillage with thin liquids, reduced labial stripping, moderately-severely prolonged mastication with ultimate removal of mechanical soft boluses, piecemeal deglutition, and lingual pumping. The swallow was often triggered with the head of the liquid boluses at the valleculae and pyriform sinuses. However, penetration was only observed once when pt attempted to swallow a pill with thin liquids and no instances of aspiration were demonstrated. Pt was unable to propel the barium tablet posteriorly and swallow it whole with liquids or puree boluses. A dysphagia 1 (puree) diet with thin liquids is recommended at this time. SLP will follow pt for dysphagia treatment.   SLP Visit Diagnosis Dysphagia, oropharyngeal phase (R13.12) Attention and concentration deficit following -- Frontal lobe and executive function deficit following -- Impact on safety and function Mild aspiration risk   CHL IP TREATMENT RECOMMENDATION 10/09/2019 Treatment Recommendations Therapy as outlined in treatment plan below   Prognosis 10/09/2019 Prognosis for Safe Diet Advancement Good Barriers to Reach Goals Time post onset Barriers/Prognosis Comment -- CHL IP DIET RECOMMENDATION 10/09/2019 SLP Diet Recommendations Dysphagia 1 (Puree) solids;Thin liquid Liquid Administration via Cup;Straw Medication Administration Crushed with puree Compensations Slow rate;Small sips/bites Postural Changes Seated upright at 90 degrees   CHL IP OTHER RECOMMENDATIONS 10/09/2019 Recommended Consults -- Oral Care Recommendations Oral care BID Other Recommendations Have oral suction available   CHL IP FOLLOW UP RECOMMENDATIONS 10/09/2019 Follow up Recommendations Other (comment)   CHL IP FREQUENCY AND DURATION 10/09/2019 Speech Therapy Frequency (ACUTE ONLY) min 2x/week Treatment Duration 2 weeks      CHL IP ORAL PHASE 10/09/2019 Oral Phase Impaired Oral - Pudding Teaspoon -- Oral - Pudding Cup -- Oral - Honey Teaspoon -- Oral - Honey Cup -- Oral - Nectar Teaspoon -- Oral - Nectar Cup -- Oral - Nectar Straw Lingual pumping;Lingual/palatal residue;Piecemeal swallowing;Reduced posterior propulsion Oral - Thin Teaspoon -- Oral -  Thin Cup Lingual pumping;Lingual/palatal residue;Piecemeal swallowing;Reduced posterior propulsion;Left anterior bolus loss Oral - Thin Straw Lingual pumping;Lingual/palatal residue;Piecemeal swallowing;Reduced posterior propulsion;Left anterior bolus loss Oral - Puree Lingual/palatal residue;Piecemeal swallowing;Reduced posterior propulsion;Incomplete tongue to palate contact Oral - Mech Soft Lingual pumping;Lingual/palatal residue;Piecemeal swallowing;Reduced posterior propulsion;Incomplete tongue to palate  contact;Impaired mastication Oral - Regular Lingual pumping;Lingual/palatal residue;Piecemeal swallowing;Reduced posterior propulsion;Impaired mastication Oral - Multi-Consistency -- Oral - Pill Lingual pumping;Lingual/palatal residue;Piecemeal swallowing;Reduced posterior propulsion Oral Phase - Comment --  CHL IP PHARYNGEAL PHASE 10/09/2019 Pharyngeal Phase Impaired Pharyngeal- Pudding Teaspoon -- Pharyngeal -- Pharyngeal- Pudding Cup -- Pharyngeal -- Pharyngeal- Honey Teaspoon -- Pharyngeal -- Pharyngeal- Honey Cup -- Pharyngeal -- Pharyngeal- Nectar Teaspoon -- Pharyngeal -- Pharyngeal- Nectar Cup -- Pharyngeal -- Pharyngeal- Nectar Straw Delayed swallow initiation-vallecula;Delayed swallow initiation-pyriform sinuses Pharyngeal -- Pharyngeal- Thin Teaspoon -- Pharyngeal -- Pharyngeal- Thin Cup Delayed swallow initiation-vallecula;Delayed swallow initiation-pyriform sinuses Pharyngeal -- Pharyngeal- Thin Straw Delayed swallow initiation-vallecula;Delayed swallow initiation-pyriform sinuses;Penetration/Aspiration during swallow Pharyngeal Material enters airway, remains ABOVE vocal cords then ejected out Pharyngeal- Puree WFL Pharyngeal -- Pharyngeal- Mechanical Soft WFL Pharyngeal -- Pharyngeal- Regular WFL Pharyngeal -- Pharyngeal- Multi-consistency -- Pharyngeal -- Pharyngeal- Pill WFL Pharyngeal -- Pharyngeal Comment --  Shanika I. Vear Clock, MS, CCC-SLP Acute Rehabilitation Services Office number [phone] Pager [phone] Scheryl Marten 10/09/2019, 3:04 PM              ECHOCARDIOGRAM COMPLETE  Result Date: 10/09/2019    ECHOCARDIOGRAM REPORT   Patient Name:   October E Kroeze Date of Exam: 10/09/2019 Medical Rec #:  [id number]   Height:       64.0 in Accession #:    [id number]  Weight:       164.2 lb Date of Birth:  Jul 14, 1940   BSA:          1.799 m Patient Age:    79 years    BP:           187/86 mmHg Patient Gender: F           HR:           67 bpm. Exam Location:  Inpatient Procedure: 2D Echo, Color  Doppler and Cardiac Doppler Indications:    I48.91* Unspecified atrial fibrillation  History:        Patient has no prior history of Echocardiogram examinations.                 Risk Factors:Hypertension and Dyslipidemia.  Sonographer:    Irving Burton Senior RDCS Referring Phys: Effie Shy [place] [facility] IMPRESSIONS  1. Left ventricular ejection fraction, by estimation, is 65 to 70%. The left ventricle has normal function. The left ventricle has no regional wall motion abnormalities. There is mild left ventricular hypertrophy. Left ventricular diastolic parameters are consistent with Grade I diastolic dysfunction (impaired relaxation).  2. Right ventricular systolic function is normal. The right ventricular size is normal.  3. Left atrial size was moderately dilated.  4. The mitral valve is abnormal. Moderate mitral annular calcification. No evidence of mitral valve regurgitation.  5. The aortic valve was not well visualized. Aortic valve regurgitation is not visualized. No aortic stenosis is present.  6. 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 The left ventricle has normal function. The left ventricle has no regional wall motion abnormalities. The left ventricular internal cavity size was normal in size. There is  mild left ventricular  hypertrophy. Left ventricular diastolic parameters are consistent with Grade I diastolic dysfunction (impaired relaxation). Right Ventricle: The right ventricular size is normal. Right vetricular wall thickness was not assessed. Right ventricular systolic function is normal. Left Atrium: Left atrial size was moderately dilated. Right Atrium: Right atrial size was normal in size. Pericardium: There is no evidence of pericardial effusion. Mitral Valve: The mitral valve is abnormal. Moderate mitral annular calcification. No evidence of mitral valve  regurgitation. Tricuspid Valve: The tricuspid valve is normal in structure. Tricuspid valve regurgitation is trivial. Aortic Valve: The aortic valve was not well visualized. Aortic valve regurgitation is not visualized. No aortic stenosis is present. Pulmonic Valve: The pulmonic valve was not well visualized. Pulmonic valve regurgitation is not visualized. Aorta: The aortic root and ascending aorta are structurally normal, with no evidence of dilitation. IAS/Shunts: The interatrial septum is aneurysmal. The interatrial septum was not well visualized.  LEFT VENTRICLE PLAX 2D LVIDd:         4.30 cm  Diastology LVIDs:         2.50 cm  LV e' lateral:   5.22 cm/s LV PW:         1.20 cm  LV E/e' lateral: 7.1 LV IVS:        1.20 cm  LV e' medial:    5.77 cm/s LVOT diam:     2.00 cm  LV E/e' medial:  6.5 LV SV:         68 LV SV Index:   38 LVOT Area:     3.14 cm  RIGHT VENTRICLE RV S prime:     10.40 cm/s TAPSE (M-mode): 1.5 cm LEFT ATRIUM             Index       RIGHT ATRIUM           Index LA diam:        3.70 cm 2.06 cm/m  RA Area:     13.00 cm LA Vol (A2C):   73.4 ml 40.79 ml/m RA Volume:   28.00 ml  15.56 ml/m LA Vol (A4C):   72.7 ml 40.41 ml/m LA Biplane Vol: 75.6 ml 42.02 ml/m  AORTIC VALVE LVOT Vmax:   96.00 cm/s LVOT Vmean:  75.200 cm/s LVOT VTI:    0.217 m  AORTA Ao Root diam: 3.00 cm Ao Asc diam:  3.40 cm MITRAL VALVE MV Area (PHT): 3.72 cm    SHUNTS MV Decel Time: 204 msec    Systemic VTI:  0.22 m MV E velocity: 37.30 cm/s  Systemic Diam: 2.00 cm MV A velocity: 73.70 cm/s MV E/A ratio:  0.51 Epifanio Lesches MD Electronically signed by Epifanio Lesches MD Signature Date/Time: 10/09/2019/3:16:31 PM    Final    VAS US CAROTID  Result Date: 10/09/2019 Carotid Arterial Duplex Study Indications:       Stenosis seen on MR. Risk Factors:      Hypertension, hyperlipidemia, current smoker, prior CVA,                    PAD. Comparison Study:  No prior studies. Performing Technologist: Jean Rosenthal   Examination Guidelines: A complete evaluation includes B-mode imaging, spectral Doppler, color Doppler, and power Doppler as needed of all accessible portions of each vessel. Bilateral testing is considered an integral part of a complete examination. Limited examinations for reoccurring indications may be performed as noted.  Right Carotid Findings: +----------+--------+--------+--------+------------------+--------+           PSV  cm/sEDV cm/sStenosisPlaque DescriptionComments +----------+--------+--------+--------+------------------+--------+ CCA Prox  45      8               heterogenous               +----------+--------+--------+--------+------------------+--------+ CCA Distal39      9               calcific                   +----------+--------+--------+--------+------------------+--------+ ICA Prox  111     30      40-59%  calcific                   +----------+--------+--------+--------+------------------+--------+ ICA Distal52      15                                         +----------+--------+--------+--------+------------------+--------+ ECA       89      9                                          +----------+--------+--------+--------+------------------+--------+ +----------+--------+-------+----------------+-------------------+           PSV cm/sEDV cmsDescribe        Arm Pressure (mmHG) +----------+--------+-------+----------------+-------------------+ [id number]             Multiphasic, WNL                    +----------+--------+-------+----------------+-------------------+ +---------+--------+--+--------+--+---------+ VertebralPSV cm/s65EDV cm/s12Antegrade +---------+--------+--+--------+--+---------+  Left Carotid Findings: +----------+--------+--------+--------+------------------+--------+           PSV cm/sEDV cm/sStenosisPlaque DescriptionComments +----------+--------+--------+--------+------------------+--------+ CCA Prox   52      11              heterogenous               +----------+--------+--------+--------+------------------+--------+ CCA Distal55      12              heterogenous               +----------+--------+--------+--------+------------------+--------+ ICA Prox  106     36      40-59%  heterogenous               +----------+--------+--------+--------+------------------+--------+ ICA Distal105     31                                         +----------+--------+--------+--------+------------------+--------+ ECA       57      8                                          +----------+--------+--------+--------+------------------+--------+ +----------+--------+--------+----------------+-------------------+           PSV cm/sEDV cm/sDescribe        Arm Pressure (mmHG) +----------+--------+--------+----------------+-------------------+ [id number]             Multiphasic, WNL                    +----------+--------+--------+----------------+-------------------+ +---------+--------+--+--------+--+---------+ VertebralPSV cm/s48EDV cm/s18Antegrade +---------+--------+--+--------+--+---------+   Summary: Right Carotid: Velocities in the right ICA  are consistent with a 40-59%                stenosis. Non-hemodynamically significant plaque <50% noted in                the CCA. Left Carotid: Velocities in the left ICA are consistent with a 40-59% stenosis.               Non-hemodynamically significant plaque <50% noted in the CCA. Vertebrals:  Bilateral vertebral arteries demonstrate antegrade flow. Subclavians: Normal flow hemodynamics were seen in bilateral subclavian              arteries. *See table(s) above for measurements and observations.  Electronically signed by Waverly Ferrari MD on 10/09/2019 at 5:11:07 PM.    Final         Scheduled Meds: . [START ON 10/11/2019] aspirin EC  81 mg Oral Daily  . atorvastatin  40 mg Oral Daily  . buPROPion  150 mg Oral q morning  - 10a  . clopidogrel  75 mg Oral Daily  . escitalopram  10 mg Oral QHS  . heparin  5,000 Units Subcutaneous Q8H  . pantoprazole  20 mg Oral Daily  . sodium chloride flush  3 mL Intravenous Once   Continuous Infusions:   LOS: 2 days    Time spent: 38 minutes spent on chart review, discussion with nursing staff, consultants, updating family and interview/physical exam; more than 50% of that time was spent in counseling and/or coordination of care.    Alvira Philips [place], DO [facility] Available via Epic secure chat 7am-7pm After these hours, please refer to coverage provider listed on [email or web address] 10/10/2019, 3:34 PM

## 2019-10-10 NOTE — Care Management (Addendum)
Provided patient with SNF bed offers , medicare.gov ratings. Patient states her sister is on her way to hospital and she will discuss offers with her sister. NCM will follow up.   Update 1530 Returned to patient's room. Sister present. They have decided on [facility] . [name] with [facility] aware.  [name] will submit for insurance authorization. [name] requesting covid test to be ordered tomorrow 10/11/19 . MD aware  Ronny Flurry RN

## 2019-10-10 NOTE — Progress Notes (Signed)
Inpatient Rehab Admissions Coordinator Note:   Per PT recommendations, pt was screened for CIR candidacy by Estill Dooms, PT, DPT.  Pt appears to be a good candidate for CIR, however OT is recommending SNF and workup for placement in progress.  If pt would like to be considered for CIR, please place an IP Rehab MD consult order and we will follow up with patient for assessment.  Please contact me with questions.   Estill Dooms, PT, DPT [phone] 10/10/19 11:19 AM

## 2019-10-10 NOTE — Progress Notes (Signed)
STROKE TEAM PROGRESS NOTE   INTERVAL HISTORY Patient neurological exam shows some improvement.  She has no new complaints.  Carotid ultrasound shows 40 to 59% right ICA stenosis.  2D echo shows normal ejection fraction without cardiac source of embolism.  Vital signs are stable.  Vitals:   10/10/19 0356 10/10/19 0858 10/10/19 1216 10/10/19 1823  BP: (!) 161/85 (!) 150/104 (!) 153/83 139/76  Pulse: 63 69 66 63  Resp: 18 16 18 18   Temp: 98.3 F (36.8 C)  98 F (36.7 C) 98.1 F (36.7 C)  TempSrc: Oral  Oral Oral  SpO2: 97% 98% 96% 97%  Weight:       CBC:  Recent Labs  Lab 10/09/19 0311 10/10/19 0739  WBC 3.2* 3.8*  NEUTROABS 1.5* 1.9  HGB 12.4 12.9  HCT 40.8 42.5  MCV 83.4 83.3  PLT 222 228   Basic Metabolic Panel:  Recent Labs  Lab 10/09/19 0311 10/10/19 0739  NA 140 138  K 3.4* 3.6  CL 105 105  CO2 25 25  GLUCOSE 90 91  BUN 10 6*  CREATININE 0.74 0.80  CALCIUM 9.5 9.6  MG 2.0  --    Lipid Panel:  Recent Labs  Lab 10/09/19 0311  CHOL 158  TRIG 71  HDL 50  CHOLHDL 3.2  VLDL 14  LDLCALC 94   HgbA1c:  Recent Labs  Lab 10/09/19 0311  HGBA1C 5.4   Urine Drug Screen: No results for input(s): LABOPIA, COCAINSCRNUR, LABBENZ, AMPHETMU, THCU, LABBARB in the last 168 hours.  Alcohol Level No results for input(s): ETH in the last 168 hours.  IMAGING past 24 hours No results found.  PHYSICAL EXAM Pleasant elderly African-American lady not in distress. . Afebrile. Head is nontraumatic. Neck is supple without bruit.    Cardiac exam no murmur or gallop. Lungs are clear to auscultation. Distal pulses are well felt. Neurological Exam ; Awake alert oriented x 3 normal speech and language. Mild left lower face asymmetry. Tongue midline.  Mild left hemiparesis with upper and lower extremity drift.  Drift. Mild diminished fine finger movements on left. Orbits right over left upper extremity. Mild left grip weak.. Normal sensation . Normal  coordination.  ASSESSMENT/PLAN [name] is a 79 y.o. female with history of HTN, HLD, prior history of stroke in 1994 with no residual deficits presenting with L sided weakness.   Stroke: Large R corona radiata infarct likely embolic secondary to unknown source  CT head No acute abnormality.   MRI large R caudate infarct. Multiple old small vessel disease infarcts.  MRA head & neck no ELVO, proximal R ICA 50% stenosis. 32mm ACA aneurysm  Carotid Doppler  pending (recheck R ICA)  2D Echo pending   LDL 94  HgbA1c 5.4  VTE prophylaxis - Heparin 5000 units sq tid   aspirin 81 mg daily prior to admission, now on aspirin 300 mg suppository daily. Plan DAPT x 3 weeks once able to swallow.    Therapy recommendations:  SNF per OT, PT to see  Disposition:  pending (lived alone PTA)  Hypertension  Stable . Permissive hypertension (OK if < 220/120) but gradually normalize in 5-7 days . Long-term BP goal normotensive  Hyperlipidemia  Home meds:  No statin  Now on lipitor 40   LDL 94, goal < 70  Continue statin at discharge  Dysphagia . Secondary to stroke . NPO . Passed swallow yesterday but today w/ water running out side of mouth . Speech to see  Other Stroke Risk Factors  Advanced age  Cigarette smoker  Overweight, Body mass index is 28.19 kg/m., recommend weight loss, diet and exercise as appropriate   Hx stroke/TIA  1994 - dizzy - no residual per pt    PAD s/p angioplasty and stent L FSA 2007  Other Active Problems  Slow cognition, ? baseline  Dehydration  Hypokalemia  Fall  GERD  deoression   Hospital day # 2 She presented with several days of left hemiparesis secondary to large right subcortical infarct.  MRI shows previous old lacunar strokes but this infarct seems quite large hence will recommend 30-day heart monitor as outpatient to look for paroxysmal A. fib.   Mobilize out of bed.  Therapy consults.  Will likely need rehab.   Follow-up as an outpatient stroke clinic in 6 weeks.  Stroke team will sign off.  Kindly call for questions.  Discussed with Dr. [place] greater than 50% time during this 25-minute visit was spent on counseling and coordination of care about her stroke and answering questions. Delia Heady, MD To contact Stroke Continuity provider, please refer to [email or web address]. After hours, contact General Neurology

## 2019-10-10 NOTE — Progress Notes (Signed)
Physical Therapy Treatment Patient Details Name: [name] MRN: [id number] DOB: 1940-04-13 Today's Date: 10/10/2019    History of Present Illness [name] is a 79 y.o. female with PMH significant for HTN, HLD, prior history of stroke in 1994 with no residual deficits who presents with about 4-day history of left-sided weakness involving the left face and left arm. Pt found to have R caudate ischemic stroke.    PT Comments    Continuing work on functional mobility and activity tolerance;  Session focused on progressive ambulation, facilitation of weight shifting and stability in stance, cueing to increased attending to L side; Notable very nice progress of gait distance  Follow Up Recommendations  CIR     Equipment Recommendations  Rolling walker with 5" wheels;3in1 (PT)    Recommendations for Other Services       Precautions / Restrictions Precautions Precautions: Fall;Other (comment) Precaution Comments: L hemi and inattention Restrictions Weight Bearing Restrictions: No Other Position/Activity Restrictions: L hand grip is weak    Mobility  Bed Mobility Overal bed mobility: Needs Assistance Bed Mobility: Supine to Sit;Sit to Supine     Supine to sit: Mod assist Sit to supine: Mod assist   General bed mobility comments: Slow moving, mod handheld assist to elevate trunk to sit; Light mod assist to help LEs back into bed  Transfers Overall transfer level: Needs assistance Equipment used: Rolling walker (2 wheeled) Transfers: Sit to/from Stand Sit to Stand: Mod assist         General transfer comment: mod to control power up and to help manage the LUE to grasp walker  Ambulation/Gait Ambulation/Gait assistance: Min assist;Mod assist Gait Distance (Feet): 40 Feet Assistive device: Rolling walker (2 wheeled) Gait Pattern/deviations: Decreased step length - right;Decreased step length - left;Decreased stride length;Scissoring Gait velocity: reduced   General  Gait Details: Difficulty with LLE advancement; erratic step width, especially with turns; cues for posture, RW proximity; one instance of loss of balance requiring mod assist to regain balance   Stairs             Wheelchair Mobility    Modified Rankin (Stroke Patients Only) Modified Rankin (Stroke Patients Only) Pre-Morbid Rankin Score: No significant disability Modified Rankin: Moderately severe disability     Balance Overall balance assessment: Needs assistance   Sitting balance-Leahy Scale: Fair       Standing balance-Leahy Scale: Poor                              Cognition Arousal/Alertness: Awake/alert Behavior During Therapy: Flat affect Overall Cognitive Status: No family/caregiver present to determine baseline cognitive functioning Area of Impairment: Awareness;Safety/judgement;Following commands;Problem solving                             Problem Solving: Slow processing;Decreased initiation;Difficulty sequencing;Requires verbal cues;Requires tactile cues General Comments: Follows directions/instructions with incr time      Exercises      General Comments General comments (skin integrity, edema, etc.): Assist to stabiize LUE on RW      Pertinent Vitals/Pain Pain Assessment: No/denies pain    Home Living                      Prior Function            PT Goals (current goals can now be found in the care plan section) Acute  Rehab PT Goals Patient Stated Goal: recover independence, gait PT Goal Formulation: With patient Time For Goal Achievement: 10/23/19 Potential to Achieve Goals: Good Progress towards PT goals: Progressing toward goals    Frequency    Min 4X/week      PT Plan Frequency needs to be updated;Current plan remains appropriate (per dept protocol)    Co-evaluation              AM-PAC PT "6 Clicks" Mobility   Outcome Measure  Help needed turning from your back to your side while in  a flat bed without using bedrails?: A Little Help needed moving from lying on your back to sitting on the side of a flat bed without using bedrails?: A Lot Help needed moving to and from a bed to a chair (including a wheelchair)?: A Lot Help needed standing up from a chair using your arms (e.g., wheelchair or bedside chair)?: A Lot Help needed to walk in hospital room?: A Lot Help needed climbing 3-5 steps with a railing? : A Little 6 Click Score: 14    End of Session Equipment Utilized During Treatment: Gait belt Activity Tolerance: Patient tolerated treatment well Patient left: in bed;with call bell/phone within reach;with bed alarm set (with bed in semi-chair position) Nurse Communication: Mobility status PT Visit Diagnosis: Unsteadiness on feet (R26.81);Muscle weakness (generalized) (M62.81);Hemiplegia and hemiparesis Hemiplegia - Right/Left: Left Hemiplegia - dominant/non-dominant: Non-dominant Hemiplegia - caused by: Cerebral infarction     Time: 0905-0930 PT Time Calculation (min) (ACUTE ONLY): 25 min  Charges:  $Gait Training: 8-22 mins $Therapeutic Activity: 8-22 mins                     Van Clines, PT  Acute Rehabilitation Services Pager [phone] Office [phone]    [name] 10/10/2019, 12:21 PM

## 2019-10-10 NOTE — TOC Progression Note (Signed)
Transition of Care [facility]) - Progression Note    Patient Details  Name: [name] MRN: [id number] Date of Birth: [birth year]  [facility] of [facility]) CM/SW Contact  Doy Hutching, [place] Phone Number: 10/10/2019, 10:57 AM  Clinical Narrative:    Provided CMS packets to pt and explained the ratings and how to review the information. Pt sister to visit again today to assist her as needed. TOC team to f/u.   Expected Discharge Plan: Skilled Nursing Facility Barriers to Discharge: Continued Medical Work up, English as a second language teacher  Expected Discharge Plan and Services Expected Discharge Plan: Skilled Nursing Facility In-house Referral: Clinical Social Work Discharge Planning Services: CM Consult Living arrangements for the past 2 months: Single Family Home  Readmission Risk Interventions Readmission Risk Prevention Plan 10/09/2019  Post Dischage Appt Not Complete  Appt Comments rec for SNF  Medication Screening Complete  Transportation Screening Complete  Some recent data might be hidden

## 2019-10-11 LAB — CBC WITH DIFFERENTIAL/PLATELET
Abs Immature Granulocytes: 0.01 10*3/uL (ref 0.00–0.07)
Basophils Absolute: 0.1 10*3/uL (ref 0.0–0.1)
Basophils Relative: 1 %
Eosinophils Absolute: 0.1 10*3/uL (ref 0.0–0.5)
Eosinophils Relative: 1 %
HCT: 41.7 % (ref 36.0–46.0)
Hemoglobin: 12.9 g/dL (ref 12.0–15.0)
Immature Granulocytes: 0 %
Lymphocytes Relative: 39 %
Lymphs Abs: 1.7 10*3/uL (ref 0.7–4.0)
MCH: 25.5 pg — ABNORMAL LOW (ref 26.0–34.0)
MCHC: 30.9 g/dL (ref 30.0–36.0)
MCV: 82.6 fL (ref 80.0–100.0)
Monocytes Absolute: 0.4 10*3/uL (ref 0.1–1.0)
Monocytes Relative: 9 %
Neutro Abs: 2.2 10*3/uL (ref 1.7–7.7)
Neutrophils Relative %: 50 %
Platelets: 248 10*3/uL (ref 150–400)
RBC: 5.05 MIL/uL (ref 3.87–5.11)
RDW: 14.2 % (ref 11.5–15.5)
WBC: 4.4 10*3/uL (ref 4.0–10.5)
nRBC: 0 % (ref 0.0–0.2)

## 2019-10-11 LAB — BASIC METABOLIC PANEL
Anion gap: 9 (ref 5–15)
BUN: 8 mg/dL (ref 8–23)
CO2: 25 mmol/L (ref 22–32)
Calcium: 9.7 mg/dL (ref 8.9–10.3)
Chloride: 102 mmol/L (ref 98–111)
Creatinine, Ser: 0.87 mg/dL (ref 0.44–1.00)
GFR calc Af Amer: >60 mL/min (ref >60)
GFR calc non Af Amer: >60 mL/min (ref >60)
Glucose, Bld: 111 mg/dL — ABNORMAL HIGH (ref 70–99)
Potassium: 3.7 mmol/L (ref 3.5–5.1)
Sodium: 136 mmol/L (ref 135–145)

## 2019-10-11 LAB — SARS CORONAVIRUS 2 BY RT PCR (HOSPITAL ORDER, PERFORMED IN ~~LOC~~ HOSPITAL LAB): SARS Coronavirus 2: NEGATIVE

## 2019-10-11 MED ORDER — AMLODIPINE BESYLATE 10 MG PO TABS
10.0000 mg | ORAL_TABLET | Freq: Every day | ORAL | Status: DC
  Administered 2019-10-11 – 2019-10-12 (×2): 10 mg via ORAL
  Filled 2019-10-11 (×2): qty 1

## 2019-10-11 NOTE — TOC Progression Note (Signed)
Transition of Care [facility]) - Progression Note    Patient Details  Name: [name] MRN: [id number] Date of Birth: Aug 08, 1940  Transition of Care [facility]) CM/SW Contact  Doy Hutching, [place] Phone Number: 10/11/2019, 5:34 PM  Clinical Narrative:    [facility] Harvey received by [facility], they can admit pt tomorrow. Pt has confirmed vaccinations- received both doses at [facility]. She is aware facility can accept tomorrow, 8/20.    Expected Discharge Plan: Skilled Nursing Facility Barriers to Discharge: Continued Medical Work up, English as a second language teacher  Expected Discharge Plan and Services Expected Discharge Plan: Skilled Nursing Facility In-house Referral: Clinical Social Work Discharge Planning Services: CM Consult   Living arrangements for the past 2 months: Single Family Home    Readmission Risk Interventions Readmission Risk Prevention Plan 10/09/2019  Post Dischage Appt Not Complete  Appt Comments rec for SNF  Medication Screening Complete  Transportation Screening Complete  Some recent data might be hidden

## 2019-10-11 NOTE — Evaluation (Signed)
Speech Language Pathology Evaluation Patient Details Name: [name] MRN: [id number] DOB: 1940/09/10 Today's Date: 10/11/2019 Time: 5974-1638 SLP Time Calculation (min) (ACUTE ONLY): 20 min  Problem List:  Patient Active Problem List   Diagnosis Date Noted  . Acute ischemic stroke (HCC) 10/08/2019  . Status post left rotator cuff repair 02/13/2018  . Complete rotator cuff tear of left shoulder 01/27/2018  . S/P left rotator cuff repair 01/27/2018  . PVD (peripheral vascular disease) with claudication (HCC) 05/24/2012  . Peripheral vascular disease, unspecified (HCC) 11/24/2011  . HYPERCHOLESTEROLEMIA 04/21/2006  . OBESITY, NOS 04/21/2006  . DEPRESSION, MAJOR, RECURRENT 04/21/2006  . TOBACCO DEPENDENCE 04/21/2006  . HYPERTENSION, BENIGN SYSTEMIC 04/21/2006  . HAND PAIN 04/21/2006   Past Medical History:  Past Medical History:  Diagnosis Date  . Arthritis   . Depression   . High cholesterol   . Hypertension   . Pneumonia   . Stroke [facility])    1994 no residuals   Past Surgical History:  Past Surgical History:  Procedure Laterality Date  . ABDOMINAL HYSTERECTOMY    . catheterization of the left superficial femoral artery  10/20/2009  . SHOULDER ARTHROSCOPY WITH ROTATOR CUFF REPAIR AND SUBACROMIAL DECOMPRESSION Left 01/27/2018   Procedure: LEFT SHOULDER ARTHROSCOPY WITH DEBRIDEMENT, SUBACROMIAL DECOMPRESSION AND ROTATOR CUFF REPAIR;  Surgeon: Kathryne Hitch, MD;  Location: [facility];  Service: Orthopedics;  Laterality: Left;  . SHOULDER SURGERY     Left with dr. Gayla Doss arthroscopy with possible rotator cuff repair  . TONSILLECTOMY     HPI:  [name] is a 79 y.o. female with PMH significant for HTN, HLD, prior history of stroke in 1994 with no residual deficits who presents with about 4-day history of left-sided weakness involving the left face and left arm. Pt found to have R caudate ischemic stroke   Assessment / Plan / Recommendation Clinical Impression  Pt  presents with reported change in cognitive linguistic function s/p CVA. Prior to hospitalization pt and family report decline in cognitive function including some anomia of speech and reduced short term memory. Since CVA, pt self reports cognition has worsened. PLOF pt states living alone in apartment, and claims she was independent with ADLs including medicine and financial mangement. Pt oriented x4. Slowed processing noted as well as reduced executive function skills, and reduced thought organization. Recommend cognitive intervention at next level of care.     SLP Assessment  SLP Recommendation/Assessment: All further Speech Lanaguage Pathology  needs can be addressed in the next venue of care SLP Visit Diagnosis: Cognitive communication deficit (R41.841)    Follow Up Recommendations  Skilled Nursing facility    Frequency and Duration           SLP Evaluation Cognition  Overall Cognitive Status: Impaired/Different from baseline Arousal/Alertness: Awake/alert Orientation Level: Oriented X4 Attention: Alternating;Divided Alternating Attention: Impaired Alternating Attention Impairment: Verbal complex;Functional complex Divided Attention: Impaired Divided Attention Impairment: Verbal complex;Functional complex Memory: Impaired Memory Impairment: Decreased short term memory;Decreased recall of new information Decreased Short Term Memory: Verbal complex;Functional complex Problem Solving: Impaired Executive Function: Organizing;Self Monitoring Organizing: Impaired Safety/Judgment: Impaired       Comprehension       Expression Expression Primary Mode of Expression: Verbal Verbal Expression Overall Verbal Expression: Impaired at baseline Naming: Impairment Written Expression Dominant Hand: Right   Oral / Motor  Oral Motor/Sensory Function Overall Oral Motor/Sensory Function: Mild impairment Facial ROM: Reduced left;Suspected CN VII (facial) dysfunction Facial Symmetry:  Abnormal symmetry left;Suspected CN VII (facial)  dysfunction Facial Strength: Reduced left;Suspected CN VII (facial) dysfunction Facial Sensation: Reduced left;Suspected CN V (Trigeminal) dysfunction Lingual ROM: Reduced left;Suspected CN XII (hypoglossal) dysfunction Lingual Symmetry: Within Functional Limits Lingual Strength: Reduced;Suspected CN XII (hypoglossal) dysfunction Motor Speech Overall Motor Speech: Impaired at baseline Articulation: Impaired Intelligibility: Intelligible Motor Planning: Witnin functional limits   GO                    Barbara Freeman E Kleigh Hoelzer MA, CCC-SLP  Acute Rehabilitation Services 10/11/2019, 4:40 PM

## 2019-10-11 NOTE — Progress Notes (Addendum)
PROGRESS NOTE    [name]  [id number] DOB: Dec 31, 1940 DOA: 10/08/2019 PCP: Martha ClanShaw, William, MD    Brief Narrative:  [name] is an 79 y.o. female with past medical history of reported stroke in 1994, obesity, hypertension, dyslipidemia,PAD,depression, ongoing smoking, GERD who lives at home by herself with her sister living about 4 blocks away comes in with chief complaints of a fall which occurred 4 days prior to ED presentation.  After the fall she started noticing left arm weakness and some left-sided facial weakness, she also felt quite weak, initially she stayed on the floor for a few hours then got herself in, she also tried to go on with life and drove for couple of days but continued to have left-sided weakness mostly in her left upper extremity and face. She noticed that water was drooling down the left side of her face at times.   Work-up in the ED was noted to have found acute CVA, [facility].   Assessment & Plan:   Principal Problem:   Acute ischemic stroke (HCC) Active Problems:   HYPERCHOLESTEROLEMIA   OBESITY, NOS   TOBACCO DEPENDENCE   HYPERTENSION, BENIGN SYSTEMIC   PVD (peripheral vascular disease) with claudication (HCC)   Acute CVA right corona radiata Patient presenting to ED following a fall 4 days prior with associated left upper/lower extremity weakness and drooling from mouth.  CT head with no acute intracranial findings.  MR brain with acute infarct right caudate body.  MRA head/neck with no large vessel occlusion but does note 50% stenosis proximal right ICA and 4 mm anterior communicating artery aneurysm.  Carotid ultrasound with left/right ICA stenosis 40-59%.  TTE with LVEF 65-70%, mild LVH, grade 1 diastolic dysfunction, LA moderately dilated, ensure arterial septum aneurysm.  Total cholesterol 158, HDL 50, LDL 94.  Hemoglobin A1c 5.4. --Neurology recommending dual  antiplatelet therapy with Plavix 75 mg PO daily and aspirin 81 mg p.o. daily x 3 weeks; followed by Plavix alone --Atorvastatin 40 mg p.o. daily --SLP w/ recs of dysphagia 1 diet, pured solids with thin liquids, medications crushed --PT/OT following, recommend SNF placement  Depression --Bupropion 150 mg p.o. every morning and Lexapro 10 mg p.o. nightly  GERD: Continue Protonix 20 mg p.o. daily  Hx essential hypertension On hydralazine 25 mg p.o. 3 times daily, amlodipine 10 mg p.o. daily, lisinopril 40 mg p.o. daily, HCTZ 25 mg p.o. daily at home. --Restart amlodipine 10 mg p.o. daily today --Hydralazine 10 mg IV every 6 hours as needed SBP greater than 180   DVT prophylaxis: Heparin Code Status: Full code Family Communication: No family present at bedside this morning  Disposition Plan:  Status is: Inpatient  Remains inpatient appropriate because:Ongoing diagnostic testing needed not appropriate for outpatient work up, Unsafe d/c plan and Inpatient level of care appropriate due to severity of illness   Dispo: The patient is from: Home              Anticipated d/c is to: SNF              Anticipated d/c date is: 1 day              Patient currently is medically stable to d/c.   Consultants:   Neurology  Procedures:   TTE -LVEF 65-70%, mild LVH, grade 1 diastolic dysfunction, moderate LA dilation, intra-arterial septum aneurysm  Antimicrobials:   None   Subjective: Patient seen and  examined at bedside, resting comfortably.  Continues with some dysarthria and left upper/lower extremity weakness.  Awaiting SNF placement.  No other complaints or concerns at this time.  Denies headache, no visual changes, no chest pain, no palpitations, no abdominal pain.  No acute events overnight per nursing staff.  Objective: Vitals:   10/11/19 0000 10/11/19 0500 10/11/19 0812 10/11/19 1332  BP: (!) 174/86 (!) 159/73 (!) 155/85 140/77  Pulse: 63 64 68 68  Resp: 18 18 18 18   Temp:  97.7 F (36.5 C) 97.9 F (36.6 C) 98.6 F (37 C) 98.9 F (37.2 C)  TempSrc: Oral Oral Oral Oral  SpO2: 96% 100% 100% 99%  Weight:        Intake/Output Summary (Last 24 hours) at 10/11/2019 1344 Last data filed at 10/11/2019 0500 Gross per 24 hour  Intake 120 ml  Output 600 ml  Net -480 ml   Filed Weights   10/08/19 2237  Weight: 74.5 kg    Examination:  General exam: Appears calm and comfortable, chronically ill in appearance Respiratory system: Clear to auscultation. Respiratory effort normal.  Oxygenating well on room air. Cardiovascular system: S1 & S2 heard, RRR. No JVD, murmurs, rubs, gallops or clicks. No pedal edema. Gastrointestinal system: Abdomen is nondistended, soft and nontender. No organomegaly or masses felt. Normal bowel sounds heard. Central nervous system: Alert and oriented. No focal neurological deficits. Extremities: Left upper/lower extremity muscle strength 4/5 otherwise globally intact throughout Skin: No rashes, lesions or ulcers Psychiatry: Judgement and insight appear normal. Mood & affect appropriate.     Data Reviewed: I have personally reviewed following labs and imaging studies  CBC: Recent Labs  Lab 10/08/19 1559 10/08/19 2026 10/09/19 0311 10/10/19 0739 10/11/19 0823  WBC 4.0 3.6* 3.2* 3.8* 4.4  NEUTROABS 2.0  --  1.5* 1.9 2.2  HGB 12.2 10.5* 12.4 12.9 12.9  HCT 40.6 34.6* 40.8 42.5 41.7  MCV 87.1 87.6 83.4 83.3 82.6  PLT 226 176 222 228 248   Basic Metabolic Panel: Recent Labs  Lab 10/08/19 1559 10/08/19 2026 10/09/19 0311 10/10/19 0739 10/11/19 0823  NA 139  --  140 138 136  K 3.4*  --  3.4* 3.6 3.7  CL 104  --  105 105 102  CO2 25  --  25 25 25   GLUCOSE 117*  --  90 91 111*  BUN 14  --  10 6* 8  CREATININE 1.05* 0.80 0.74 0.80 0.87  CALCIUM 9.4  --  9.5 9.6 9.7  MG  --   --  2.0  --   --    GFR: CrCl cannot be calculated (Unknown ideal weight.). Liver Function Tests: Recent Labs  Lab 10/08/19 1559  AST 17   ALT 8  ALKPHOS 43  BILITOT 1.0  PROT 6.8  ALBUMIN 3.8   No results for input(s): LIPASE, AMYLASE in the last 168 hours. No results for input(s): AMMONIA in the last 168 hours. Coagulation Profile: Recent Labs  Lab 10/08/19 1559  INR 1.1   Cardiac Enzymes: Recent Labs  Lab 10/08/19 2026  CKTOTAL 278*   BNP (last 3 results) No results for input(s): PROBNP in the last 8760 hours. HbA1C: Recent Labs    10/09/19 0311  HGBA1C 5.4   CBG: No results for input(s): GLUCAP in the last 168 hours. Lipid Profile: Recent Labs    10/09/19 0311  CHOL 158  HDL 50  LDLCALC 94  TRIG 71  CHOLHDL 3.2   Thyroid Function Tests: No  results for input(s): TSH, T4TOTAL, FREET4, T3FREE, THYROIDAB in the last 72 hours. Anemia Panel: No results for input(s): VITAMINB12, FOLATE, FERRITIN, TIBC, IRON, RETICCTPCT in the last 72 hours. Sepsis Labs: No results for input(s): PROCALCITON, LATICACIDVEN in the last 168 hours.  Recent Results (from the past 240 hour(s))  SARS Coronavirus 2 by RT PCR (hospital order, performed in [facility] hospital lab) Nasopharyngeal Nasopharyngeal Swab     Status: None   Collection Time: 10/08/19  5:44 PM   Specimen: Nasopharyngeal Swab  Result Value Ref Range Status   SARS Coronavirus 2 NEGATIVE NEGATIVE Final    Comment: (NOTE) SARS-CoV-2 target nucleic acids are NOT DETECTED.  The SARS-CoV-2 RNA is generally detectable in upper and lower respiratory specimens during the acute phase of infection. The lowest concentration of SARS-CoV-2 viral copies this assay can detect is 250 copies / mL. A negative result does not preclude SARS-CoV-2 infection and should not be used as the sole basis for treatment or other patient management decisions.  A negative result may occur with improper specimen collection / handling, submission of specimen other than nasopharyngeal swab, presence of viral mutation(s) within the areas targeted by this assay, and inadequate  number of viral copies (<250 copies / mL). A negative result must be combined with clinical observations, patient history, and epidemiological information.  Fact Sheet for Patients:   BoilerBrush.com.cy  Fact Sheet for Healthcare Providers: https://pope.com/  This test is not yet approved or  cleared by the [place] FDA and has been authorized for detection and/or diagnosis of SARS-CoV-2 by FDA under an Emergency Use Authorization (EUA).  This EUA will remain in effect (meaning this test can be used) for the duration of the COVID-19 declaration under Section 564(b)(1) of the Act, 21 U.S.C. section 360bbb-3(b)(1), unless the authorization is terminated or revoked sooner.  Performed at [facility], [address], [place], [place] [address]   SARS Coronavirus 2 by RT PCR (hospital order, performed in [facility] hospital lab) Nasopharyngeal Nasopharyngeal Swab     Status: None   Collection Time: 10/11/19  7:29 AM   Specimen: Nasopharyngeal Swab  Result Value Ref Range Status   SARS Coronavirus 2 NEGATIVE NEGATIVE Final    Comment: (NOTE) SARS-CoV-2 target nucleic acids are NOT DETECTED.  The SARS-CoV-2 RNA is generally detectable in upper and lower respiratory specimens during the acute phase of infection. The lowest concentration of SARS-CoV-2 viral copies this assay can detect is 250 copies / mL. A negative result does not preclude SARS-CoV-2 infection and should not be used as the sole basis for treatment or other patient management decisions.  A negative result may occur with improper specimen collection / handling, submission of specimen other than nasopharyngeal swab, presence of viral mutation(s) within the areas targeted by this assay, and inadequate number of viral copies (<250 copies / mL). A negative result must be combined with clinical observations, patient history, and epidemiological information.  Fact  Sheet for Patients:   BoilerBrush.com.cy  Fact Sheet for Healthcare Providers: https://pope.com/  This test is not yet approved or  cleared by the [place] FDA and has been authorized for detection and/or diagnosis of SARS-CoV-2 by FDA under an Emergency Use Authorization (EUA).  This EUA will remain in effect (meaning this test can be used) for the duration of the COVID-19 declaration under Section 564(b)(1) of the Act, 21 U.S.C. section 360bbb-3(b)(1), unless the authorization is terminated or revoked sooner.  Performed at [facility], [address] [place], [place] [address]          Radiology Studies: DG Swallowing Func-Speech Pathology  Result Date: 10/09/2019 Objective Swallowing Evaluation: Type of Study: MBS-Modified Barium Swallow Study  Patient Details Name: LATOSHIA MONRROY MRN: [id number] Date of Birth: 01-26-1941 Today's Date: 10/09/2019 Time: SLP Start Time (ACUTE ONLY): 1349 -SLP Stop Time (ACUTE ONLY): 1412 SLP Time Calculation (min) (ACUTE ONLY): 23 min Past Medical History: Past Medical History: Diagnosis Date . Arthritis  . Depression  . High cholesterol  . Hypertension  . Pneumonia  . Stroke [facility])   1994 no residuals Past Surgical History: Past Surgical History: Procedure Laterality Date . ABDOMINAL HYSTERECTOMY   . catheterization of the left superficial femoral artery  10/20/2009 . SHOULDER ARTHROSCOPY WITH ROTATOR CUFF REPAIR AND SUBACROMIAL DECOMPRESSION Left 01/27/2018  Procedure: LEFT SHOULDER ARTHROSCOPY WITH DEBRIDEMENT, SUBACROMIAL DECOMPRESSION AND ROTATOR CUFF REPAIR;  Surgeon: Kathryne Hitch, MD;  Location: [facility];  Service: Orthopedics;  Laterality: Left; . SHOULDER SURGERY    Left with dr. Gayla Doss arthroscopy with possible rotator cuff repair . TONSILLECTOMY   HPI: Pt is a 79 y.o. female with PMH significant for HTN, HLD, prior history of stroke in 1994 with no residual deficits who presents with  about 4-day history of left-sided weakness involving the left face and left arm. Pt found to have R caudate ischemic stroke.  Pt reports h/o dysphagia to liquids more than solids causing her to cough at times.  She states this is a chronic issue but denies recurrent pneumonias nor requiring heimlich manuever.  Pt has h/o multiple old small vessel infarcts per imaging including in areas of brainstem, cerebellum and periventricular white matter.  Pt noted to have 50% stenosis of Right internal carotid artery.  She passed the [facility] swallow screen yesterday.  Per order, pt was noted to lose liquids bilateral from mouth and SLP eval was ordered.  Subjective: pt awake in bed, ECHO staff at bedside and agreeable to pause test to allow SlP swallow eval completion Assessment / Plan / Recommendation CHL IP CLINICAL IMPRESSIONS 10/09/2019 Clinical Impression Pt presents with oropharyngeal dysphagia characterized by reduced labial seal, impaired mastication, impaired bolus formation, impaired A-P transport, and a pharyngeal delay. She demonstrated mild anterior spillage with thin liquids, reduced labial stripping, moderately-severely prolonged mastication with ultimate removal of mechanical soft boluses, piecemeal deglutition, and lingual pumping. The swallow was often triggered with the head of the liquid boluses at the valleculae and pyriform sinuses. However, penetration was only observed once when pt attempted to swallow a pill with thin liquids and no instances of aspiration were demonstrated. Pt was unable to propel the barium tablet posteriorly and swallow it whole with liquids or puree boluses. A dysphagia 1 (puree) diet with thin liquids is recommended at this time. SLP will follow pt for dysphagia treatment.  SLP Visit Diagnosis Dysphagia, oropharyngeal phase (R13.12) Attention and concentration deficit following -- Frontal lobe and executive function deficit following -- Impact on safety and function Mild aspiration  risk   CHL IP TREATMENT RECOMMENDATION 10/09/2019 Treatment Recommendations Therapy as outlined in treatment plan below   Prognosis 10/09/2019 Prognosis for Safe Diet Advancement Good Barriers to Reach Goals Time post onset Barriers/Prognosis Comment -- CHL IP DIET RECOMMENDATION 10/09/2019 SLP Diet Recommendations Dysphagia 1 (Puree) solids;Thin liquid Liquid Administration via Cup;Straw Medication Administration Crushed with puree Compensations Slow rate;Small sips/bites Postural Changes Seated upright at 90 degrees   CHL IP OTHER RECOMMENDATIONS 10/09/2019 Recommended Consults -- Oral Care Recommendations  Oral care BID Other Recommendations Have oral suction available   CHL IP FOLLOW UP RECOMMENDATIONS 10/09/2019 Follow up Recommendations Other (comment)   CHL IP FREQUENCY AND DURATION 10/09/2019 Speech Therapy Frequency (ACUTE ONLY) min 2x/week Treatment Duration 2 weeks      CHL IP ORAL PHASE 10/09/2019 Oral Phase Impaired Oral - Pudding Teaspoon -- Oral - Pudding Cup -- Oral - Honey Teaspoon -- Oral - Honey Cup -- Oral - Nectar Teaspoon -- Oral - Nectar Cup -- Oral - Nectar Straw Lingual pumping;Lingual/palatal residue;Piecemeal swallowing;Reduced posterior propulsion Oral - Thin Teaspoon -- Oral - Thin Cup Lingual pumping;Lingual/palatal residue;Piecemeal swallowing;Reduced posterior propulsion;Left anterior bolus loss Oral - Thin Straw Lingual pumping;Lingual/palatal residue;Piecemeal swallowing;Reduced posterior propulsion;Left anterior bolus loss Oral - Puree Lingual/palatal residue;Piecemeal swallowing;Reduced posterior propulsion;Incomplete tongue to palate contact Oral - Mech Soft Lingual pumping;Lingual/palatal residue;Piecemeal swallowing;Reduced posterior propulsion;Incomplete tongue to palate contact;Impaired mastication Oral - Regular Lingual pumping;Lingual/palatal residue;Piecemeal swallowing;Reduced posterior propulsion;Impaired mastication Oral - Multi-Consistency -- Oral - Pill Lingual  pumping;Lingual/palatal residue;Piecemeal swallowing;Reduced posterior propulsion Oral Phase - Comment --  CHL IP PHARYNGEAL PHASE 10/09/2019 Pharyngeal Phase Impaired Pharyngeal- Pudding Teaspoon -- Pharyngeal -- Pharyngeal- Pudding Cup -- Pharyngeal -- Pharyngeal- Honey Teaspoon -- Pharyngeal -- Pharyngeal- Honey Cup -- Pharyngeal -- Pharyngeal- Nectar Teaspoon -- Pharyngeal -- Pharyngeal- Nectar Cup -- Pharyngeal -- Pharyngeal- Nectar Straw Delayed swallow initiation-vallecula;Delayed swallow initiation-pyriform sinuses Pharyngeal -- Pharyngeal- Thin Teaspoon -- Pharyngeal -- Pharyngeal- Thin Cup Delayed swallow initiation-vallecula;Delayed swallow initiation-pyriform sinuses Pharyngeal -- Pharyngeal- Thin Straw Delayed swallow initiation-vallecula;Delayed swallow initiation-pyriform sinuses;Penetration/Aspiration during swallow Pharyngeal Material enters airway, remains ABOVE vocal cords then ejected out Pharyngeal- Puree WFL Pharyngeal -- Pharyngeal- Mechanical Soft WFL Pharyngeal -- Pharyngeal- Regular WFL Pharyngeal -- Pharyngeal- Multi-consistency -- Pharyngeal -- Pharyngeal- Pill WFL Pharyngeal -- Pharyngeal Comment --  Shanika I. Vear Clock, MS, CCC-SLP Acute Rehabilitation Services Office number [phone] Pager [phone] Scheryl Marten 10/09/2019, 3:04 PM                   Scheduled Meds: . aspirin EC  81 mg Oral Daily  . atorvastatin  40 mg Oral Daily  . buPROPion  150 mg Oral q morning - 10a  . clopidogrel  75 mg Oral Daily  . escitalopram  10 mg Oral QHS  . heparin  5,000 Units Subcutaneous Q8H  . pantoprazole  20 mg Oral Daily  . sodium chloride flush  3 mL Intravenous Once   Continuous Infusions:   LOS: 3 days    Time spent: 38 minutes spent on chart review, discussion with nursing staff, consultants, updating family and interview/physical exam; more than 50% of that time was spent in counseling and/or coordination of care.    Alvira Philips [place], DO [facility] Available via Epic secure chat 7am-7pm After these hours, please refer to coverage provider listed on [email or web address] 10/11/2019, 1:44 PM

## 2019-10-11 NOTE — Progress Notes (Signed)
  Speech Language Pathology Treatment: Dysphagia  Patient Details Name: [name] MRN: [id number] DOB: 1940-04-18 Today's Date: 10/11/2019 Time: 6962-9528 SLP Time Calculation (min) (ACUTE ONLY): 15 min  Assessment / Plan / Recommendation Clinical Impression  Assessed readiness for diet upgrade. Pt was on modified diet of dysphagia 1 (puree) and thin liquids; she states due to displeasure of puree consistencies, decreased PO consumption. Assessed pt with regular and mechanical soft textures. Note prolonged mastication with upper dentures only. Pts sister present who states at baseline "she takes a long time to eat". With extended time, pt able to adequately achieve oral clearance. No overt s/sx of aspiration. Recommend upgrade to dysphagia 3 (mechanical soft) and thin liquids with safe swallow strategies and aspiration precautions. SLP to continue to monitor.    HPI HPI: [name] is a 79 y.o. female with PMH significant for HTN, HLD, prior history of stroke in 1994 with no residual deficits who presents with about 4-day history of left-sided weakness involving the left face and left arm. Pt found to have R caudate ischemic stroke      SLP Plan  Continue with current plan of care  All further Speech Lanaguage Pathology  needs can be addressed in the next venue of care    Recommendations  Diet recommendations: Dysphagia 3 (mechanical soft);Thin liquid Liquids provided via: Cup;Straw Medication Administration: Whole meds with puree Supervision: Patient able to self feed;Intermittent supervision to cue for compensatory strategies Compensations: Slow rate;Small sips/bites;Follow solids with liquid Postural Changes and/or Swallow Maneuvers: Seated upright 90 degrees;Upright 30-60 min after meal                Oral Care Recommendations: Oral care BID Follow up Recommendations: Skilled Nursing facility SLP Visit Diagnosis: Dysphagia, oropharyngeal phase (R13.12) Plan: Continue with  current plan of care       GO                Damoni Causby E Atharv Barriere MA, CCC-SLP  Acute Rehabilitation Services 10/11/2019, 4:44 PM

## 2019-10-11 NOTE — Progress Notes (Signed)
Physical Therapy Treatment Patient Details Name: [name] MRN: [id number] DOB: 06/29/1940 Today's Date: 10/11/2019    History of Present Illness [name] is a 79 y.o. female with PMH significant for HTN, HLD, prior history of stroke in 1994 with no residual deficits who presents with about 4-day history of left-sided weakness involving the left face and left arm. Pt found to have R caudate ischemic stroke.    PT Comments    The pt is continuing to make good progress with functional gait and mobility with PT, but continues to require assist for transfers and functional use of LUE to maintain upright position and stability with gait. The pt was able to progress gait distances this session and had decreased instances of scissoring with steps, but continues to use a significantly slowed gait speed, with minimal clearance and DF of the LLE which can increase risk of falls. The pt will continue to benefit from skilled PT to progress functional strength, standing and dynamic stability, and endurance to facilitate return to prior level of function and independence.     Follow Up Recommendations  CIR     Equipment Recommendations  Rolling walker with 5" wheels;3in1 (PT)    Recommendations for Other Services       Precautions / Restrictions Precautions Precautions: Fall;Other (comment) Precaution Comments: L hemi and inattention Restrictions Weight Bearing Restrictions: No Other Position/Activity Restrictions: L hand grip is weak    Mobility  Bed Mobility Overal bed mobility: Needs Assistance Bed Mobility: Supine to Sit     Supine to sit: Mod assist     General bed mobility comments: modA to initiate and maintain stability, pt wanting to perform movement with les assist, and was able to use minA with significantly increased time  Transfers Overall transfer level: Needs assistance Equipment used: Rolling walker (2 wheeled) Transfers: Sit to/from Stand Sit to Stand: Mod  assist         General transfer comment: mod to control power up and to help manage the LUE to grasp walker  Ambulation/Gait Ambulation/Gait assistance: Min assist Gait Distance (Feet): 75 Feet Assistive device: Rolling walker (2 wheeled) Gait Pattern/deviations: Decreased step length - right;Decreased step length - left;Decreased stride length;Decreased dorsiflexion - left;Decreased weight shift to left Gait velocity: 0.08 m/s Gait velocity interpretation: <1.31 ft/sec, indicative of household ambulator General Gait Details: Decreased step length and Df with LLE despite cues. The pt has poor clearance (especially with LLE), and requires intermittant assist to LUE to maintain grip on the RW    Modified Rankin (Stroke Patients Only) Modified Rankin (Stroke Patients Only) Pre-Morbid Rankin Score: No significant disability Modified Rankin: Moderately severe disability     Balance Overall balance assessment: Needs assistance Sitting-balance support: Feet supported Sitting balance-Leahy Scale: Fair Sitting balance - Comments: static sitting fair, with movement, requiring min guard to Min A to maintain    Standing balance support: Bilateral upper extremity supported Standing balance-Leahy Scale: Poor Standing balance comment: BUE support to maintain upright, intermittent minA to LUE to maintain grip with mobility                            Cognition Arousal/Alertness: Awake/alert Behavior During Therapy: Flat affect Overall Cognitive Status: Impaired/Different from baseline Area of Impairment: Awareness;Safety/judgement;Following commands;Problem solving                   Current Attention Level: Sustained   Following Commands: Follows one step commands  inconsistently;Follows one step commands with increased time Safety/Judgement: Decreased awareness of safety;Decreased awareness of deficits Awareness: Intellectual Problem Solving: Slow processing;Decreased  initiation;Difficulty sequencing;Requires verbal cues;Requires tactile cues General Comments: Pt follows instructions with significantly increased time, slow with all mobility. Able to verbalize needs/ monitor exertion      Exercises      General Comments General comments (skin integrity, edema, etc.): assist to position and maintain LUE on RW grip      Pertinent Vitals/Pain Pain Assessment: No/denies pain Pain Intervention(s): Monitored during session           PT Goals (current goals can now be found in the care plan section) Acute Rehab PT Goals Patient Stated Goal: recover independence, gait PT Goal Formulation: With patient Time For Goal Achievement: 10/23/19 Potential to Achieve Goals: Good Progress towards PT goals: Progressing toward goals    Frequency    Min 4X/week      PT Plan Current plan remains appropriate       AM-PAC PT "6 Clicks" Mobility   Outcome Measure  Help needed turning from your back to your side while in a flat bed without using bedrails?: A Little Help needed moving from lying on your back to sitting on the side of a flat bed without using bedrails?: A Lot Help needed moving to and from a bed to a chair (including a wheelchair)?: A Lot Help needed standing up from a chair using your arms (e.g., wheelchair or bedside chair)?: A Little Help needed to walk in hospital room?: A Little Help needed climbing 3-5 steps with a railing? : A Lot 6 Click Score: 15    End of Session Equipment Utilized During Treatment: Gait belt Activity Tolerance: Patient tolerated treatment well Patient left: with call bell/phone within reach;in chair;with family/visitor present;with chair alarm set Nurse Communication: Mobility status (removal of purewick) PT Visit Diagnosis: Unsteadiness on feet (R26.81);Muscle weakness (generalized) (M62.81);Hemiplegia and hemiparesis Hemiplegia - Right/Left: Left Hemiplegia - dominant/non-dominant: Non-dominant Hemiplegia -  caused by: Cerebral infarction     Time: 2202-5427 PT Time Calculation (min) (ACUTE ONLY): 32 min  Charges:  $Gait Training: 23-37 mins                     Rolm Baptise, PT, DPT   Acute Rehabilitation Department Pager #: [phone]   [name] 10/11/2019, 4:49 PM

## 2019-10-12 DIAGNOSIS — K219 Gastro-esophageal reflux disease without esophagitis: Secondary | ICD-10-CM | POA: Diagnosis not present

## 2019-10-12 DIAGNOSIS — I6930 Unspecified sequelae of cerebral infarction: Secondary | ICD-10-CM | POA: Diagnosis not present

## 2019-10-12 DIAGNOSIS — M6281 Muscle weakness (generalized): Secondary | ICD-10-CM | POA: Diagnosis not present

## 2019-10-12 DIAGNOSIS — G8194 Hemiplegia, unspecified affecting left nondominant side: Secondary | ICD-10-CM | POA: Diagnosis not present

## 2019-10-12 DIAGNOSIS — N3281 Overactive bladder: Secondary | ICD-10-CM | POA: Diagnosis not present

## 2019-10-12 DIAGNOSIS — I10 Essential (primary) hypertension: Secondary | ICD-10-CM | POA: Diagnosis not present

## 2019-10-12 DIAGNOSIS — E785 Hyperlipidemia, unspecified: Secondary | ICD-10-CM | POA: Diagnosis not present

## 2019-10-12 DIAGNOSIS — M2550 Pain in unspecified joint: Secondary | ICD-10-CM | POA: Diagnosis not present

## 2019-10-12 DIAGNOSIS — F339 Major depressive disorder, recurrent, unspecified: Secondary | ICD-10-CM | POA: Diagnosis not present

## 2019-10-12 DIAGNOSIS — I639 Cerebral infarction, unspecified: Secondary | ICD-10-CM | POA: Diagnosis not present

## 2019-10-12 DIAGNOSIS — E669 Obesity, unspecified: Secondary | ICD-10-CM | POA: Diagnosis not present

## 2019-10-12 DIAGNOSIS — I739 Peripheral vascular disease, unspecified: Secondary | ICD-10-CM | POA: Diagnosis not present

## 2019-10-12 DIAGNOSIS — R5381 Other malaise: Secondary | ICD-10-CM | POA: Diagnosis not present

## 2019-10-12 DIAGNOSIS — Z7401 Bed confinement status: Secondary | ICD-10-CM | POA: Diagnosis not present

## 2019-10-12 DIAGNOSIS — I69811 Memory deficit following other cerebrovascular disease: Secondary | ICD-10-CM | POA: Diagnosis not present

## 2019-10-12 DIAGNOSIS — I69891 Dysphagia following other cerebrovascular disease: Secondary | ICD-10-CM | POA: Diagnosis not present

## 2019-10-12 MED ORDER — ASPIRIN 81 MG PO TABS: 81.0000 mg | ORAL_TABLET | Freq: Every day | ORAL | 0 refills | Status: AC

## 2019-10-12 MED ORDER — MYRBETRIQ 25 MG PO TB24: 25.0000 mg | ORAL_TABLET | Freq: Every day | ORAL | 0 refills | Status: DC

## 2019-10-12 MED ORDER — ESCITALOPRAM OXALATE 10 MG PO TABS: ORAL_TABLET | ORAL | 0 refills | Status: DC

## 2019-10-12 MED ORDER — EZETIMIBE 10 MG PO TABS: 10.0000 mg | ORAL_TABLET | Freq: Every day | ORAL | 0 refills | Status: DC

## 2019-10-12 MED ORDER — BUPROPION HCL ER (XL) 150 MG PO TB24: 150.0000 mg | ORAL_TABLET | Freq: Every morning | ORAL | 0 refills | Status: DC

## 2019-10-12 MED ORDER — ATORVASTATIN CALCIUM 10 MG PO TABS: 10.0000 mg | ORAL_TABLET | Freq: Every day | ORAL | 0 refills | Status: DC

## 2019-10-12 MED ORDER — AMLODIPINE BESYLATE 10 MG PO TABS: 10.0000 mg | ORAL_TABLET | Freq: Every day | ORAL | 0 refills | Status: DC

## 2019-10-12 MED ORDER — LISINOPRIL 40 MG PO TABS: 40.0000 mg | ORAL_TABLET | Freq: Every day | ORAL | 0 refills | Status: DC

## 2019-10-12 MED ORDER — PANTOPRAZOLE SODIUM 20 MG PO TBEC: 20.0000 mg | DELAYED_RELEASE_TABLET | Freq: Every day | ORAL | 0 refills | Status: DC

## 2019-10-12 MED ORDER — CLOPIDOGREL BISULFATE 75 MG PO TABS: 75.0000 mg | ORAL_TABLET | Freq: Every day | ORAL | 0 refills | Status: AC

## 2019-10-12 NOTE — Discharge Summary (Addendum)
Physician Discharge Summary  [name] ZOX:[id number] DOB: 1940-06-27 DOA: 10/08/2019  PCP: Barbara Clan, MD  Admit date: 10/08/2019 Discharge date: 10/12/2019  Admitted From: Home Disposition: [facility]  Recommendations for Outpatient Follow-up:  1. Follow up with PCP in 1-2 weeks 2. Follow-up with neurology 4 weeks 3. Continue dual antiplatelet therapy with aspirin and Plavix x3 weeks followed by Plavix alone per neurology 4. Discontinued home HCTZ/hydralazine 5. Follow-up blood pressures closely over next week  Home Health: No Equipment/Devices: None  Discharge Condition: Stable CODE STATUS: Full code Diet recommendation: Dysphagia 3 diet, mechanical soft with thin liquids, meds crushed with pure, heart healthy, low-salt diet.  History of present illness:  [name] is an 79 y.o.femalewith past medical history of reported stroke in 1994, obesity, hypertension, dyslipidemia,PAD,depression, ongoing smoking, GERD who lives at home by herself with her sister living about 4 blocks away comes in with chief complaints of a fall which occurred 4 days prior to ED presentation.  After the fall she started noticing left arm weakness and some left-sided facial weakness, she also felt quite weak, initially she stayed on the floor for a few hours then got herself in, she also tried to go on with life and drove for couple of days but continued to have left-sided weakness mostly in her left upper extremity and face. She noticed that water was drooling down the left side of her face at times.  Work-up in the ED was noted to have found acute CVA, neurology was consulted and [name] consulted for admission for further evaluation work-up.  Hospital course:  Acute CVA right corona radiata Patient presenting to ED following a fall 4 days prior with associated left upper/lower extremity weakness and drooling from mouth.  CT head with no acute intracranial findings.  MR brain  with acute infarct right caudate body. MRA head/neck with no large vessel occlusion but does note 50% stenosis proximal right ICA and 4 mm anterior communicating artery aneurysm. Carotid ultrasound with left/right ICA stenosis 40-59%.  TTE with LVEF 65-70%, mild LVH, grade 1 diastolic dysfunction, LA moderately dilated, ensure arterial septum aneurysm.  Total cholesterol 158, HDL 50, LDL 94. Hemoglobin A1c 5.4. Neurology recommending dual antiplatelet therapy with Plavix 75 mg PO daily and aspirin 81 mg p.o. daily x 3 weeks; followed by Plavix alone. Continue atorvastatin 40 mg p.o. daily. SLP w/ recs of dysphagia 3 diet, mechanical soft with thin liquids, medications crushed. [name].  Depression Bupropion 150 mg p.o. every morning and Lexapro 10 mg p.o. nightly  GERD: Continue Protonix 20 mg p.o. daily  Hx essential hypertension Resumed home amlodipine 10 mg p.o. daily, lisinopril 40 mg p.o. daily. Will discontinue home HCTZ and hydralazine for now. Follow-up blood pressures at SNF and may restart further hypertensives if blood pressure elevated.  Discharge Diagnoses:  Principal Problem:   Acute ischemic stroke (HCC) Active Problems:   HYPERCHOLESTEROLEMIA   OBESITY, NOS   TOBACCO DEPENDENCE   HYPERTENSION, BENIGN SYSTEMIC   PVD (peripheral vascular disease) with claudication [facility])    Discharge Instructions  Discharge Instructions    Ambulatory referral to Neurology   Complete by: As directed    Follow up in stroke clinic at [facility] with Barbara Austin, NP in about 4 weeks. If not available, consider [name], [name], or [name].   Call MD for:  difficulty breathing, headache or visual disturbances   Complete by:  As directed    Call MD for:  extreme fatigue   Complete by: As directed    Call MD for:  persistant dizziness or light-headedness   Complete by: As directed    Call MD for:   persistant nausea and vomiting   Complete by: As directed    Call MD for:  severe uncontrolled pain   Complete by: As directed    Call MD for:  temperature >100.4   Complete by: As directed    Diet - low sodium heart healthy   Complete by: As directed    Increase activity slowly   Complete by: As directed      Allergies as of 10/12/2019      Reactions   Penicillins Swelling, Other (See Comments)   Has patient had a PCN reaction causing immediate rash, facial/tongue/throat swelling, SOB or lightheadedness with hypotension: Yes Has patient had a PCN reaction causing severe rash involving mucus membranes or skin necrosis: No Has patient had a PCN reaction that required hospitalization: No Has patient had a PCN reaction occurring within the last 10 years: No If all of the above answers are "NO", then may proceed with Cephalosporin use.   Shellfish Allergy Itching, Swelling   Zoloft [sertraline] Rash      Medication List    STOP taking these medications   cyclobenzaprine 10 MG tablet Commonly known as: FLEXERIL   hydrALAZINE 25 MG tablet Commonly known as: APRESOLINE   hydrochlorothiazide 25 MG tablet Commonly known as: HYDRODIURIL   HYDROcodone-acetaminophen 5-325 MG tablet Commonly known as: NORCO/VICODIN     TAKE these medications   albuterol 108 (90 Base) MCG/ACT inhaler Commonly known as: VENTOLIN HFA Inhale 2 puffs into the lungs every 6 (six) hours as needed for wheezing or shortness of breath.   amLODipine 10 MG tablet Commonly known as: NORVASC Take 1 tablet (10 mg total) by mouth daily.   aspirin 81 MG tablet Take 1 tablet (81 mg total) by mouth daily.   atorvastatin 10 MG tablet Commonly known as: LIPITOR Take 1 tablet (10 mg total) by mouth daily.   buPROPion 150 MG 24 hr tablet Commonly known as: WELLBUTRIN XL Take 1 tablet (150 mg total) by mouth every morning.   CALCIUM 600+D HIGH POTENCY PO Take 1 tablet by mouth daily.   clopidogrel 75 MG  tablet Commonly known as: PLAVIX Take 1 tablet (75 mg total) by mouth daily. Start taking on: October 13, 2019   escitalopram 10 MG tablet Commonly known as: LEXAPRO 1  qam   ezetimibe 10 MG tablet Commonly known as: ZETIA Take 1 tablet (10 mg total) by mouth daily.   lisinopril 40 MG tablet Commonly known as: ZESTRIL Take 1 tablet (40 mg total) by mouth daily.   Myrbetriq 25 MG Tb24 tablet Generic drug: mirabegron ER Take 1 tablet (25 mg total) by mouth daily.   pantoprazole 20 MG tablet Commonly known as: PROTONIX Take 1 tablet (20 mg total) by mouth daily.   Vitamin D 50 MCG (2000 UT) tablet Take 2,000 Units by mouth daily.       Contact information for follow-up providers    [facility]. Schedule an appointment as soon as possible for a visit in 4 week(s).   Specialty: Neurology Why: stroke clinic Contact information: [address] [place] [address] [phone]       Barbara Clan, MD. Schedule an appointment as soon as possible for a visit in 1 week(s).  Specialty: Internal Medicine Contact information: [address] [place] [address] [phone]            Contact information for after-discharge care    Destination    [facility] Preferred SNF .   Service: Skilled Nursing Contact information: [address] [place] [address] [phone]                 Allergies  Allergen Reactions   Penicillins Swelling and Other (See Comments)    Has patient had a PCN reaction causing immediate rash, facial/tongue/throat swelling, SOB or lightheadedness with hypotension: Yes Has patient had a PCN reaction causing severe rash involving mucus membranes or skin necrosis: No Has patient had a PCN reaction that required hospitalization: No Has patient had a PCN reaction occurring within the last 10 years: No If all of the above answers are "NO", then may proceed with  Cephalosporin use.    Shellfish Allergy Itching and Swelling   Zoloft [Sertraline] Rash    Consultations:  Neurology   Procedures/Studies: CT HEAD WO CONTRAST  Result Date: 10/08/2019 CLINICAL DATA:  Larey Seat 4 days ago, left arm weakness, left-sided facial droop EXAM: CT HEAD WITHOUT CONTRAST TECHNIQUE: Contiguous axial images were obtained from the base of the skull through the vertex without intravenous contrast. COMPARISON:  10/21/2018 FINDINGS: Brain: Hypodensities are seen throughout the bilateral basal ganglia and periventricular white matter, grossly stable since prior study and compatible with chronic small vessel ischemic changes. Evidence of prior right occipital infarct. No evidence of acute infarct or hemorrhage. The lateral ventricles and midline structures are otherwise unremarkable. No acute extra-axial fluid collections. No mass effect. Vascular: No hyperdense vessel or unexpected calcification. Skull: Normal. Negative for fracture or focal lesion. Sinuses/Orbits: Stable polypoid mucosal thickening right maxillary sinus. Remaining sinuses are clear. Other: None. IMPRESSION: 1. Stable chronic ischemic changes as above. No acute intracranial process. Electronically Signed   By: Sharlet Salina M.D.   On: 10/08/2019 17:47   MR ANGIO HEAD WO CONTRAST  Result Date: 10/08/2019 CLINICAL DATA:  Stroke, follow up; Neuro deficit, acute, stroke suspected EXAM: MRI HEAD WITHOUT CONTRAST MRA HEAD WITHOUT CONTRAST MRA NECK WITHOUT CONTRAST TECHNIQUE: Multiplanar, multiecho pulse sequences of the brain and surrounding structures were obtained without intravenous contrast. Angiographic images of the Circle of Willis were obtained using MRA technique without intravenous contrast. Angiographic images of the neck were obtained using MRA technique without intravenous contrast. Carotid stenosis measurements (when applicable) are obtained utilizing NASCET criteria, using the distal internal carotid  diameter as the denominator. COMPARISON:  None. FINDINGS: MRI HEAD FINDINGS BRAIN: There is a small acute infarct of the right caudate body. There are multiple old small vessel infarcts of the periventricular white matter, brainstem and cerebellum. There is an old right occipital infarct. Early confluent hyperintense T2-weighted signal of the periventricular and deep white matter, most commonly due to chronic ischemic microangiopathy. Generalized atrophy without lobar predilection. Approximately 10 scattered chronic microhemorrhages. Midline structures are normal. SKULL AND UPPER CERVICAL SPINE: The visualized skull base, calvarium, upper cervical spine and extracranial soft tissues are normal. SINUSES/ORBITS: No fluid levels or advanced mucosal thickening. No mastoid or middle ear effusion. The orbits are normal. MRA HEAD FINDINGS POSTERIOR CIRCULATION: --Basilar artery: Normal. --Posterior cerebral arteries: Normal. Both originate from the basilar artery. --Superior cerebellar arteries: Normal. --Inferior cerebellar arteries: Normal anterior and posterior inferior cerebellar arteries. ANTERIOR CIRCULATION: --Intracranial internal carotid arteries: Normal. --Anterior cerebral arteries: There is a 4 mm anterior communicating artery aneurysm.  Otherwise the anterior cerebral arteries are normal. --Middle cerebral arteries: Normal. --Posterior communicating arteries: Present on the left, absent on the right. MRA NECK FINDINGS Limited MRA of the neck. The imaged portions of these cervical carotid and vertebral arteries are patent. There is 50% stenosis of the proximal right internal carotid artery. IMPRESSION: 1. Small acute infarct of the right caudate body. No hemorrhage or mass effect. 2. Multiple old small vessel infarcts and other sequelae of chronic ischemic microangiopathy. 3. No emergent large vessel occlusion or high-grade stenosis. 4. 50% stenosis of the proximal right internal carotid artery. 5. 4 mm anterior  communicating artery aneurysm. Electronically Signed   By: Deatra Robinson M.D.   On: 10/08/2019 20:08   MR ANGIO NECK WO CONTRAST  Result Date: 10/08/2019 CLINICAL DATA:  Stroke, follow up; Neuro deficit, acute, stroke suspected EXAM: MRI HEAD WITHOUT CONTRAST MRA HEAD WITHOUT CONTRAST MRA NECK WITHOUT CONTRAST TECHNIQUE: Multiplanar, multiecho pulse sequences of the brain and surrounding structures were obtained without intravenous contrast. Angiographic images of the Circle of Willis were obtained using MRA technique without intravenous contrast. Angiographic images of the neck were obtained using MRA technique without intravenous contrast. Carotid stenosis measurements (when applicable) are obtained utilizing NASCET criteria, using the distal internal carotid diameter as the denominator. COMPARISON:  None. FINDINGS: MRI HEAD FINDINGS BRAIN: There is a small acute infarct of the right caudate body. There are multiple old small vessel infarcts of the periventricular white matter, brainstem and cerebellum. There is an old right occipital infarct. Early confluent hyperintense T2-weighted signal of the periventricular and deep white matter, most commonly due to chronic ischemic microangiopathy. Generalized atrophy without lobar predilection. Approximately 10 scattered chronic microhemorrhages. Midline structures are normal. SKULL AND UPPER CERVICAL SPINE: The visualized skull base, calvarium, upper cervical spine and extracranial soft tissues are normal. SINUSES/ORBITS: No fluid levels or advanced mucosal thickening. No mastoid or middle ear effusion. The orbits are normal. MRA HEAD FINDINGS POSTERIOR CIRCULATION: --Basilar artery: Normal. --Posterior cerebral arteries: Normal. Both originate from the basilar artery. --Superior cerebellar arteries: Normal. --Inferior cerebellar arteries: Normal anterior and posterior inferior cerebellar arteries. ANTERIOR CIRCULATION: --Intracranial internal carotid arteries:  Normal. --Anterior cerebral arteries: There is a 4 mm anterior communicating artery aneurysm. Otherwise the anterior cerebral arteries are normal. --Middle cerebral arteries: Normal. --Posterior communicating arteries: Present on the left, absent on the right. MRA NECK FINDINGS Limited MRA of the neck. The imaged portions of these cervical carotid and vertebral arteries are patent. There is 50% stenosis of the proximal right internal carotid artery. IMPRESSION: 1. Small acute infarct of the right caudate body. No hemorrhage or mass effect. 2. Multiple old small vessel infarcts and other sequelae of chronic ischemic microangiopathy. 3. No emergent large vessel occlusion or high-grade stenosis. 4. 50% stenosis of the proximal right internal carotid artery. 5. 4 mm anterior communicating artery aneurysm. Electronically Signed   By: Deatra Robinson M.D.   On: 10/08/2019 20:08   MR BRAIN WO CONTRAST  Result Date: 10/08/2019 CLINICAL DATA:  Stroke, follow up; Neuro deficit, acute, stroke suspected EXAM: MRI HEAD WITHOUT CONTRAST MRA HEAD WITHOUT CONTRAST MRA NECK WITHOUT CONTRAST TECHNIQUE: Multiplanar, multiecho pulse sequences of the brain and surrounding structures were obtained without intravenous contrast. Angiographic images of the Circle of Willis were obtained using MRA technique without intravenous contrast. Angiographic images of the neck were obtained using MRA technique without intravenous contrast. Carotid stenosis measurements (when applicable) are obtained utilizing NASCET criteria, using the distal internal carotid diameter as the  denominator. COMPARISON:  None. FINDINGS: MRI HEAD FINDINGS BRAIN: There is a small acute infarct of the right caudate body. There are multiple old small vessel infarcts of the periventricular white matter, brainstem and cerebellum. There is an old right occipital infarct. Early confluent hyperintense T2-weighted signal of the periventricular and deep white matter, most  commonly due to chronic ischemic microangiopathy. Generalized atrophy without lobar predilection. Approximately 10 scattered chronic microhemorrhages. Midline structures are normal. SKULL AND UPPER CERVICAL SPINE: The visualized skull base, calvarium, upper cervical spine and extracranial soft tissues are normal. SINUSES/ORBITS: No fluid levels or advanced mucosal thickening. No mastoid or middle ear effusion. The orbits are normal. MRA HEAD FINDINGS POSTERIOR CIRCULATION: --Basilar artery: Normal. --Posterior cerebral arteries: Normal. Both originate from the basilar artery. --Superior cerebellar arteries: Normal. --Inferior cerebellar arteries: Normal anterior and posterior inferior cerebellar arteries. ANTERIOR CIRCULATION: --Intracranial internal carotid arteries: Normal. --Anterior cerebral arteries: There is a 4 mm anterior communicating artery aneurysm. Otherwise the anterior cerebral arteries are normal. --Middle cerebral arteries: Normal. --Posterior communicating arteries: Present on the left, absent on the right. MRA NECK FINDINGS Limited MRA of the neck. The imaged portions of these cervical carotid and vertebral arteries are patent. There is 50% stenosis of the proximal right internal carotid artery. IMPRESSION: 1. Small acute infarct of the right caudate body. No hemorrhage or mass effect. 2. Multiple old small vessel infarcts and other sequelae of chronic ischemic microangiopathy. 3. No emergent large vessel occlusion or high-grade stenosis. 4. 50% stenosis of the proximal right internal carotid artery. 5. 4 mm anterior communicating artery aneurysm. Electronically Signed   By: Deatra Robinson M.D.   On: 10/08/2019 20:08   DG Chest Port 1 View  Result Date: 10/08/2019 CLINICAL DATA:  79 year old female with shortness of breath. EXAM: PORTABLE CHEST 1 VIEW COMPARISON:  Chest radiograph dated 12/09/2008 and CT dated 01/14/2019 FINDINGS: The lungs are clear. There is no pleural effusion pneumothorax.  Mild cardiomegaly. Atherosclerotic calcification of the aorta. No acute osseous pathology. IMPRESSION: No active disease. Electronically Signed   By: Elgie Collard M.D.   On: 10/08/2019 19:05   DG Swallowing Func-Speech Pathology  Result Date: 10/09/2019 Objective Swallowing Evaluation: Type of Study: MBS-Modified Barium Swallow Study  Patient Details Name: NEVENA ROZENBERG MRN: [id number] Date of Birth: [birth year] Today's Date: 10/09/2019 Time: SLP Start Time (ACUTE ONLY): 1349 -SLP Stop Time (ACUTE ONLY): 1412 SLP Time Calculation (min) (ACUTE ONLY): 23 min Past Medical History: Past Medical History: Diagnosis Date  Arthritis   Depression   High cholesterol   Hypertension   Pneumonia   Stroke (HCC)   1994 no residuals Past Surgical History: Past Surgical History: Procedure Laterality Date  ABDOMINAL HYSTERECTOMY    catheterization of the left superficial femoral artery  10/20/2009  SHOULDER ARTHROSCOPY WITH ROTATOR CUFF REPAIR AND SUBACROMIAL DECOMPRESSION Left 01/27/2018  Procedure: LEFT SHOULDER ARTHROSCOPY WITH DEBRIDEMENT, SUBACROMIAL DECOMPRESSION AND ROTATOR CUFF REPAIR;  Surgeon: Kathryne Hitch, MD;  Location: [facility];  Service: Orthopedics;  Laterality: Left;  SHOULDER SURGERY    Left with dr. Gayla Doss arthroscopy with possible rotator cuff repair  TONSILLECTOMY   HPI: Pt is a 79 y.o. female with PMH significant for HTN, HLD, prior history of stroke in 1994 with no residual deficits who presents with about 4-day history of left-sided weakness involving the left face and left arm. Pt found to have R caudate ischemic stroke.  Pt reports h/o dysphagia to liquids more than solids causing her to cough at times.  She  states this is a chronic issue but denies recurrent pneumonias nor requiring heimlich manuever.  Pt has h/o multiple old small vessel infarcts per imaging including in areas of brainstem, cerebellum and periventricular white matter.  Pt noted to have 50% stenosis of Right internal  carotid artery.  She passed the [facility] swallow screen yesterday.  Per order, pt was noted to lose liquids bilateral from mouth and SLP eval was ordered.  Subjective: pt awake in bed, ECHO staff at bedside and agreeable to pause test to allow SlP swallow eval completion Assessment / Plan / Recommendation CHL IP CLINICAL IMPRESSIONS 10/09/2019 Clinical Impression Pt presents with oropharyngeal dysphagia characterized by reduced labial seal, impaired mastication, impaired bolus formation, impaired A-P transport, and a pharyngeal delay. She demonstrated mild anterior spillage with thin liquids, reduced labial stripping, moderately-severely prolonged mastication with ultimate removal of mechanical soft boluses, piecemeal deglutition, and lingual pumping. The swallow was often triggered with the head of the liquid boluses at the valleculae and pyriform sinuses. However, penetration was only observed once when pt attempted to swallow a pill with thin liquids and no instances of aspiration were demonstrated. Pt was unable to propel the barium tablet posteriorly and swallow it whole with liquids or puree boluses. A dysphagia 1 (puree) diet with thin liquids is recommended at this time. SLP will follow pt for dysphagia treatment.  SLP Visit Diagnosis Dysphagia, oropharyngeal phase (R13.12) Attention and concentration deficit following -- Frontal lobe and executive function deficit following -- Impact on safety and function Mild aspiration risk   CHL IP TREATMENT RECOMMENDATION 10/09/2019 Treatment Recommendations Therapy as outlined in treatment plan below   Prognosis 10/09/2019 Prognosis for Safe Diet Advancement Good Barriers to Reach Goals Time post onset Barriers/Prognosis Comment -- CHL IP DIET RECOMMENDATION 10/09/2019 SLP Diet Recommendations Dysphagia 1 (Puree) solids;Thin liquid Liquid Administration via Cup;Straw Medication Administration Crushed with puree Compensations Slow rate;Small sips/bites Postural Changes Seated  upright at 90 degrees   CHL IP OTHER RECOMMENDATIONS 10/09/2019 Recommended Consults -- Oral Care Recommendations Oral care BID Other Recommendations Have oral suction available   CHL IP FOLLOW UP RECOMMENDATIONS 10/09/2019 Follow up Recommendations Other (comment)   CHL IP FREQUENCY AND DURATION 10/09/2019 Speech Therapy Frequency (ACUTE ONLY) min 2x/week Treatment Duration 2 weeks      CHL IP ORAL PHASE 10/09/2019 Oral Phase Impaired Oral - Pudding Teaspoon -- Oral - Pudding Cup -- Oral - Honey Teaspoon -- Oral - Honey Cup -- Oral - Nectar Teaspoon -- Oral - Nectar Cup -- Oral - Nectar Straw Lingual pumping;Lingual/palatal residue;Piecemeal swallowing;Reduced posterior propulsion Oral - Thin Teaspoon -- Oral - Thin Cup Lingual pumping;Lingual/palatal residue;Piecemeal swallowing;Reduced posterior propulsion;Left anterior bolus loss Oral - Thin Straw Lingual pumping;Lingual/palatal residue;Piecemeal swallowing;Reduced posterior propulsion;Left anterior bolus loss Oral - Puree Lingual/palatal residue;Piecemeal swallowing;Reduced posterior propulsion;Incomplete tongue to palate contact Oral - Mech Soft Lingual pumping;Lingual/palatal residue;Piecemeal swallowing;Reduced posterior propulsion;Incomplete tongue to palate contact;Impaired mastication Oral - Regular Lingual pumping;Lingual/palatal residue;Piecemeal swallowing;Reduced posterior propulsion;Impaired mastication Oral - Multi-Consistency -- Oral - Pill Lingual pumping;Lingual/palatal residue;Piecemeal swallowing;Reduced posterior propulsion Oral Phase - Comment --  CHL IP PHARYNGEAL PHASE 10/09/2019 Pharyngeal Phase Impaired Pharyngeal- Pudding Teaspoon -- Pharyngeal -- Pharyngeal- Pudding Cup -- Pharyngeal -- Pharyngeal- Honey Teaspoon -- Pharyngeal -- Pharyngeal- Honey Cup -- Pharyngeal -- Pharyngeal- Nectar Teaspoon -- Pharyngeal -- Pharyngeal- Nectar Cup -- Pharyngeal -- Pharyngeal- Nectar Straw Delayed swallow initiation-vallecula;Delayed swallow  initiation-pyriform sinuses Pharyngeal -- Pharyngeal- Thin Teaspoon -- Pharyngeal -- Pharyngeal- Thin Cup Delayed swallow initiation-vallecula;Delayed swallow initiation-pyriform sinuses Pharyngeal --  Pharyngeal- Thin Straw Delayed swallow initiation-vallecula;Delayed swallow initiation-pyriform sinuses;Penetration/Aspiration during swallow Pharyngeal Material enters airway, remains ABOVE vocal cords then ejected out Pharyngeal- Puree WFL Pharyngeal -- Pharyngeal- Mechanical Soft WFL Pharyngeal -- Pharyngeal- Regular WFL Pharyngeal -- Pharyngeal- Multi-consistency -- Pharyngeal -- Pharyngeal- Pill WFL Pharyngeal -- Pharyngeal Comment --  Shanika I. Vear Clock, MS, CCC-SLP Acute Rehabilitation Services Office number [phone] Pager [phone] Scheryl Marten 10/09/2019, 3:04 PM              ECHOCARDIOGRAM COMPLETE  Result Date: 10/09/2019    ECHOCARDIOGRAM REPORT   Patient Name:   Honestii E Barrett Date of Exam: 10/09/2019 Medical Rec #:  [id number]   Height:       64.0 in Accession #:    [id number]  Weight:       164.2 lb Date of Birth:  December 24, 1940   BSA:          1.799 m Patient Age:    79 years    BP:           187/86 mmHg Patient Gender: F           HR:           67 bpm. Exam Location:  Inpatient Procedure: 2D Echo, Color Doppler and Cardiac Doppler Indications:    I48.91* Unspecified atrial fibrillation  History:        Patient has no prior history of Echocardiogram examinations.                 Risk Factors:Hypertension and Dyslipidemia.  Sonographer:    Irving Burton Senior RDCS Referring Phys: Effie Shy [facility] IMPRESSIONS  1. Left ventricular ejection fraction, by estimation, is 65 to 70%. The left ventricle has normal function. The left ventricle has no regional wall motion abnormalities. There is mild left ventricular hypertrophy. Left ventricular diastolic parameters are consistent with Grade I diastolic dysfunction (impaired relaxation).  2. Right ventricular systolic function is normal. The right  ventricular size is normal.  3. Left atrial size was moderately dilated.  4. The mitral valve is abnormal. Moderate mitral annular calcification. No evidence of mitral valve regurgitation.  5. The aortic valve was not well visualized. Aortic valve regurgitation is not visualized. No aortic stenosis is present.  6. 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 The left ventricle has normal function. The left ventricle has no regional wall motion abnormalities. The left ventricular internal cavity size was normal in size. There is  mild left ventricular hypertrophy. Left ventricular diastolic parameters are consistent with Grade I diastolic dysfunction (impaired relaxation). Right Ventricle: The right ventricular size is normal. Right vetricular wall thickness was not assessed. Right ventricular systolic function is normal. Left Atrium: Left atrial size was moderately dilated. Right Atrium: Right atrial size was normal in size. Pericardium: There is no evidence of pericardial effusion. Mitral Valve: The mitral valve is abnormal. Moderate mitral annular calcification. No evidence of mitral valve regurgitation. Tricuspid Valve: The tricuspid valve is normal in structure. Tricuspid valve regurgitation is trivial. Aortic Valve: The aortic valve was not well visualized. Aortic valve regurgitation is not visualized. No aortic stenosis is present. Pulmonic Valve: The pulmonic valve was not well visualized. Pulmonic valve regurgitation is not visualized. Aorta: The aortic root and ascending aorta are structurally normal, with no evidence of dilitation. IAS/Shunts: The interatrial septum is aneurysmal. The interatrial septum was not well  visualized.  LEFT VENTRICLE PLAX 2D LVIDd:         4.30 cm  Diastology LVIDs:         2.50 cm  LV e' lateral:   5.22 cm/s LV PW:         1.20 cm  LV E/e'  lateral: 7.1 LV IVS:        1.20 cm  LV e' medial:    5.77 cm/s LVOT diam:     2.00 cm  LV E/e' medial:  6.5 LV SV:         68 LV SV Index:   38 LVOT Area:     3.14 cm  RIGHT VENTRICLE RV S prime:     10.40 cm/s TAPSE (M-mode): 1.5 cm LEFT ATRIUM             Index       RIGHT ATRIUM           Index LA diam:        3.70 cm 2.06 cm/m  RA Area:     13.00 cm LA Vol (A2C):   73.4 ml 40.79 ml/m RA Volume:   28.00 ml  15.56 ml/m LA Vol (A4C):   72.7 ml 40.41 ml/m LA Biplane Vol: 75.6 ml 42.02 ml/m  AORTIC VALVE LVOT Vmax:   96.00 cm/s LVOT Vmean:  75.200 cm/s LVOT VTI:    0.217 m  AORTA Ao Root diam: 3.00 cm Ao Asc diam:  3.40 cm MITRAL VALVE MV Area (PHT): 3.72 cm    SHUNTS MV Decel Time: 204 msec    Systemic VTI:  0.22 m MV E velocity: 37.30 cm/s  Systemic Diam: 2.00 cm MV A velocity: 73.70 cm/s MV E/A ratio:  0.51 Epifanio Lesches MD Electronically signed by Epifanio Lesches MD Signature Date/Time: 10/09/2019/3:16:31 PM    Final    VAS US CAROTID  Result Date: 10/09/2019 Carotid Arterial Duplex Study Indications:       Stenosis seen on MR. Risk Factors:      Hypertension, hyperlipidemia, current smoker, prior CVA,                    PAD. Comparison Study:  No prior studies. Performing Technologist: Jean Rosenthal  Examination Guidelines: A complete evaluation includes B-mode imaging, spectral Doppler, color Doppler, and power Doppler as needed of all accessible portions of each vessel. Bilateral testing is considered an integral part of a complete examination. Limited examinations for reoccurring indications may be performed as noted.  Right Carotid Findings: +----------+--------+--------+--------+------------------+--------+             PSV cm/s EDV cm/s Stenosis Plaque Description Comments  +----------+--------+--------+--------+------------------+--------+  CCA Prox   45       8                 heterogenous                 +----------+--------+--------+--------+------------------+--------+  CCA  Distal 39       9                 calcific                     +----------+--------+--------+--------+------------------+--------+  ICA Prox   111      30       40-59%   calcific                     +----------+--------+--------+--------+------------------+--------+  ICA Distal  52       15                                             +----------+--------+--------+--------+------------------+--------+  ECA        89       9                                              +----------+--------+--------+--------+------------------+--------+ +----------+--------+-------+----------------+-------------------+             PSV cm/s EDV cms Describe         Arm Pressure (mmHG)  +----------+--------+-------+----------------+-------------------+  Subclavian 69               Multiphasic, WNL                      +----------+--------+-------+----------------+-------------------+ +---------+--------+--+--------+--+---------+  Vertebral PSV cm/s 65 EDV cm/s 12 Antegrade  +---------+--------+--+--------+--+---------+  Left Carotid Findings: +----------+--------+--------+--------+------------------+--------+             PSV cm/s EDV cm/s Stenosis Plaque Description Comments  +----------+--------+--------+--------+------------------+--------+  CCA Prox   52       11                heterogenous                 +----------+--------+--------+--------+------------------+--------+  CCA Distal 55       12                heterogenous                 +----------+--------+--------+--------+------------------+--------+  ICA Prox   106      36       40-59%   heterogenous                 +----------+--------+--------+--------+------------------+--------+  ICA Distal 105      31                                             +----------+--------+--------+--------+------------------+--------+  ECA        57       8                                              +----------+--------+--------+--------+------------------+--------+  +----------+--------+--------+----------------+-------------------+             PSV cm/s EDV cm/s Describe         Arm Pressure (mmHG)  +----------+--------+--------+----------------+-------------------+  Subclavian 104               Multiphasic, WNL                      +----------+--------+--------+----------------+-------------------+ +---------+--------+--+--------+--+---------+  Vertebral PSV cm/s 48 EDV cm/s 18 Antegrade  +---------+--------+--+--------+--+---------+   Summary: Right Carotid: Velocities in the right ICA are consistent with a 40-59%                stenosis. Non-hemodynamically significant plaque <50% noted in  the CCA. Left Carotid: Velocities in the left ICA are consistent with a 40-59% stenosis.               Non-hemodynamically significant plaque <50% noted in the CCA. Vertebrals:  Bilateral vertebral arteries demonstrate antegrade flow. Subclavians: Normal flow hemodynamics were seen in bilateral subclavian              arteries. *See table(s) above for measurements and observations.  Electronically signed by Waverly Ferrari MD on 10/09/2019 at 5:11:07 PM.    Final       Subjective: Patient seen and examined bedside, resting comfortably.  Working with occupational therapy this morning; performing ADLs at the sink.  Continues with left upper extremity weakness, left lower extremity weakness improving.  Ready for discharge to rehab today.  No other complaints or concerns at this time.  Denies headache, no fever/chills/night sweats, no nausea/vomiting/diarrhea, no chest pain, palpitations, no shortness of breath, no abdominal pain, no cough/congestion.  No acute events overnight per nursing staff.  Discharge Exam: Vitals:   10/12/19 0027 10/12/19 0438  BP: (!) 112/96 (!) 154/66  Pulse: 72 64  Resp: 18 17  Temp: 98.7 F (37.1 C) 98.7 F (37.1 C)  SpO2: 99% 100%   Vitals:   10/11/19 1725 10/11/19 2015 10/12/19 0027 10/12/19 0438  BP: (!) 147/65 140/74 (!)  112/96 (!) 154/66  Pulse: 70 63 72 64  Resp: Temp: 98 F (36.7 C) 98.9 F (37.2 C) 98.7 F (37.1 C) 98.7 F (37.1 C)  TempSrc: Oral Oral Oral Oral  SpO2: 96% 96% 99% 100%  Weight:        General: Pt is alert, awake, not in acute distress, elderly in appearance Cardiovascular: RRR, S1/S2 +, no rubs, no gallops Respiratory: CTA bilaterally, no wheezing, no rhonchi Abdominal: Soft, NT, ND, bowel sounds + Extremities: No edema, left upper/lower extremity muscle strength 4/5; otherwise preserved throughout    The results of significant diagnostics from this hospitalization (including imaging, microbiology, ancillary and laboratory) are listed below for reference.     Microbiology: Recent Results (from the past 240 hour(s))  SARS Coronavirus 2 by RT PCR (hospital order, performed in [facility] hospital lab) Nasopharyngeal Nasopharyngeal Swab     Status: None   Collection Time: 10/08/19  5:44 PM   Specimen: Nasopharyngeal Swab  Result Value Ref Range Status   SARS Coronavirus 2 NEGATIVE NEGATIVE Final    Comment: (NOTE) SARS-CoV-2 target nucleic acids are NOT DETECTED.  The SARS-CoV-2 RNA is generally detectable in upper and lower respiratory specimens during the acute phase of infection. The lowest concentration of SARS-CoV-2 viral copies this assay can detect is 250 copies / mL. A negative result does not preclude SARS-CoV-2 infection and should not be used as the sole basis for treatment or other patient management decisions.  A negative result may occur with improper specimen collection / handling, submission of specimen other than nasopharyngeal swab, presence of viral mutation(s) within the areas targeted by this assay, and inadequate number of viral copies (<250 copies / mL). A negative result must be combined with clinical observations, patient history, and epidemiological information.  Fact Sheet for Patients:    BoilerBrush.com.cy  Fact Sheet for Healthcare Providers: https://pope.com/  This test is not yet approved or  cleared by the [place] FDA and has been authorized for detection and/or diagnosis of SARS-CoV-2 by FDA under an Emergency Use Authorization (EUA).  This EUA will remain in effect (meaning this  test can be used) for the duration of the COVID-19 declaration under Section 564(b)(1) of the Act, 21 U.S.C. section 360bbb-3(b)(1), unless the authorization is terminated or revoked sooner.  Performed at [facility], [address], [place], [place] [address]   SARS Coronavirus 2 by RT PCR (hospital order, performed in [facility] hospital lab) Nasopharyngeal Nasopharyngeal Swab     Status: None   Collection Time: 10/11/19  7:29 AM   Specimen: Nasopharyngeal Swab  Result Value Ref Range Status   SARS Coronavirus 2 NEGATIVE NEGATIVE Final    Comment: (NOTE) SARS-CoV-2 target nucleic acids are NOT DETECTED.  The SARS-CoV-2 RNA is generally detectable in upper and lower respiratory specimens during the acute phase of infection. The lowest concentration of SARS-CoV-2 viral copies this assay can detect is 250 copies / mL. A negative result does not preclude SARS-CoV-2 infection and should not be used as the sole basis for treatment or other patient management decisions.  A negative result may occur with improper specimen collection / handling, submission of specimen other than nasopharyngeal swab, presence of viral mutation(s) within the areas targeted by this assay, and inadequate number of viral copies (<250 copies / mL). A negative result must be combined with clinical observations, patient history, and epidemiological information.  Fact Sheet for Patients:   BoilerBrush.com.cy  Fact Sheet for Healthcare Providers: https://pope.com/  This test is not yet approved or   cleared by the [place] FDA and has been authorized for detection and/or diagnosis of SARS-CoV-2 by FDA under an Emergency Use Authorization (EUA).  This EUA will remain in effect (meaning this test can be used) for the duration of the COVID-19 declaration under Section 564(b)(1) of the Act, 21 U.S.C. section 360bbb-3(b)(1), unless the authorization is terminated or revoked sooner.  Performed at [facility], [address], [place], [place] [address]      Labs: BNP (last 3 results) No results for input(s): BNP in the last 8760 hours. Basic Metabolic Panel: Recent Labs  Lab 10/08/19 1559 10/08/19 2026 10/09/19 0311 10/10/19 0739 10/11/19 0823  NA 139  --  140 138 136  K 3.4*  --  3.4* 3.6 3.7  CL 104  --  105 105 102  CO2 25  --  25 25 25   GLUCOSE 117*  --  90 91 111*  BUN 14  --  10 6* 8  CREATININE 1.05* 0.80 0.74 0.80 0.87  CALCIUM 9.4  --  9.5 9.6 9.7  MG  --   --  2.0  --   --    Liver Function Tests: Recent Labs  Lab 10/08/19 1559  AST 17  ALT 8  ALKPHOS 43  BILITOT 1.0  PROT 6.8  ALBUMIN 3.8   No results for input(s): LIPASE, AMYLASE in the last 168 hours. No results for input(s): AMMONIA in the last 168 hours. CBC: Recent Labs  Lab 10/08/19 1559 10/08/19 2026 10/09/19 0311 10/10/19 0739 10/11/19 0823  WBC 4.0 3.6* 3.2* 3.8* 4.4  NEUTROABS 2.0  --  1.5* 1.9 2.2  HGB 12.2 10.5* 12.4 12.9 12.9  HCT 40.6 34.6* 40.8 42.5 41.7  MCV 87.1 87.6 83.4 83.3 82.6  PLT 226 176 222 228 248   Cardiac Enzymes: Recent Labs  Lab 10/08/19 2026  CKTOTAL 278*   BNP: Invalid input(s): POCBNP CBG: No results for input(s): GLUCAP in the last 168 hours. D-Dimer No results for input(s): DDIMER in the last 72 hours. Hgb A1c No results for  input(s): HGBA1C in the last 72 hours. Lipid Profile No results for input(s): CHOL, HDL, LDLCALC, TRIG, CHOLHDL, LDLDIRECT in the last 72 hours. Thyroid function studies No results for input(s): TSH, T4TOTAL,  T3FREE, THYROIDAB in the last 72 hours.  Invalid input(s): FREET3 Anemia work up No results for input(s): VITAMINB12, FOLATE, FERRITIN, TIBC, IRON, RETICCTPCT in the last 72 hours. Urinalysis    Component Value Date/Time   COLORURINE YELLOW 01/14/2019 1118   APPEARANCEUR CLEAR 01/14/2019 1118   LABSPEC >1.046 (H) 01/14/2019 1118   PHURINE 7.0 01/14/2019 1118   GLUCOSEU 50 (A) 01/14/2019 1118   HGBUR NEGATIVE 01/14/2019 1118   BILIRUBINUR NEGATIVE 01/14/2019 1118   KETONESUR 5 (A) 01/14/2019 1118   PROTEINUR NEGATIVE 01/14/2019 1118   UROBILINOGEN 1.0 12/09/2008 1557   NITRITE NEGATIVE 01/14/2019 1118   LEUKOCYTESUR NEGATIVE 01/14/2019 1118   Sepsis Labs Invalid input(s): PROCALCITONIN,  WBC,  LACTICIDVEN Microbiology Recent Results (from the past 240 hour(s))  SARS Coronavirus 2 by RT PCR (hospital order, performed in [facility] hospital lab) Nasopharyngeal Nasopharyngeal Swab     Status: None   Collection Time: 10/08/19  5:44 PM   Specimen: Nasopharyngeal Swab  Result Value Ref Range Status   SARS Coronavirus 2 NEGATIVE NEGATIVE Final    Comment: (NOTE) SARS-CoV-2 target nucleic acids are NOT DETECTED.  The SARS-CoV-2 RNA is generally detectable in upper and lower respiratory specimens during the acute phase of infection. The lowest concentration of SARS-CoV-2 viral copies this assay can detect is 250 copies / mL. A negative result does not preclude SARS-CoV-2 infection and should not be used as the sole basis for treatment or other patient management decisions.  A negative result may occur with improper specimen collection / handling, submission of specimen other than nasopharyngeal swab, presence of viral mutation(s) within the areas targeted by this assay, and inadequate number of viral copies (<250 copies / mL). A negative result must be combined with clinical observations, patient history, and epidemiological information.  Fact Sheet for Patients:    BoilerBrush.com.cyhttps://www.fda.gov/media/136312/download  Fact Sheet for Healthcare Providers: https://pope.com/https://www.fda.gov/media/136313/download  This test is not yet approved or  cleared by the [place] FDA and has been authorized for detection and/or diagnosis of SARS-CoV-2 by FDA under an Emergency Use Authorization (EUA).  This EUA will remain in effect (meaning this test can be used) for the duration of the COVID-19 declaration under Section 564(b)(1) of the Act, 21 U.S.C. section 360bbb-3(b)(1), unless the authorization is terminated or revoked sooner.  Performed at [facility], [address] [place], [place] [id number]   SARS Coronavirus 2 by RT PCR (hospital order, performed in [facility] lab) Nasopharyngeal Nasopharyngeal Swab     Status: None   Collection Time: 10/11/19  7:29 AM   Specimen: Nasopharyngeal Swab  Result Value Ref Range Status   SARS Coronavirus 2 NEGATIVE NEGATIVE Final    Comment: (NOTE) SARS-CoV-2 target nucleic acids are NOT DETECTED.  The SARS-CoV-2 RNA is generally detectable in upper and lower respiratory specimens during the acute phase of infection. The lowest concentration of SARS-CoV-2 viral copies this assay can detect is 250 copies / mL. A negative result does not preclude SARS-CoV-2 infection and should not be used as the sole basis for treatment or other patient management decisions.  A negative result may occur with improper specimen collection / handling, submission of specimen other than nasopharyngeal swab, presence of viral mutation(s) within the areas targeted by this assay, and inadequate number of viral copies (<  250 copies / mL). A negative result must be combined with clinical observations, patient history, and epidemiological information.  Fact Sheet for Patients:   BoilerBrush.com.cy  Fact Sheet for Healthcare Providers: https://pope.com/  This test is not yet approved or   cleared by the [place] FDA and has been authorized for detection and/or diagnosis of SARS-CoV-2 by FDA under an Emergency Use Authorization (EUA).  This EUA will remain in effect (meaning this test can be used) for the duration of the COVID-19 declaration under Section 564(b)(1) of the Act, 21 U.S.C. section 360bbb-3(b)(1), unless the authorization is terminated or revoked sooner.  Performed at [facility], [address], [place], [place] [address]      Time coordinating discharge: Over 30 minutes  SIGNED:   Alvira Philips [place], DO  [facility] 10/12/2019, 10:00 AM

## 2019-10-12 NOTE — Discharge Instructions (Signed)
Ischemic Stroke  An ischemic stroke (cerebrovascular accident, or CVA) is the sudden death of brain tissue that occurs when an area of the brain does not get enough oxygen. It is a medical emergency that must be treated right away. An ischemic stroke can cause permanent loss of brain function. This can cause problems with how different parts of your body function. What are the causes? This condition is caused by a decrease of oxygen supply to an area of the brain, which may be the result of:  A small blood clot (embolus) or a buildup of plaque in the blood vessels (atherosclerosis) that blocks blood flow in the brain.  An abnormal heart rhythm (atrial fibrillation).  A blocked or damaged artery in the head or neck. Sometimes the cause of stroke is not known (cryptogenic). What increases the risk? Certain factors may make you more likely to develop this condition. Some of these factors are things that you can change, such as:  Obesity.  Smoking cigarettes.  Taking oral birth control, especially if you also use tobacco.  Physical inactivity.  Excessive alcohol use.  Use of illegal drugs, especially cocaine and methamphetamine. Other risk factors include:  High blood pressure (hypertension).  High cholesterol.  Diabetes mellitus.  Heart disease.  Being African American, Native American, Hispanic, or [place] Native.  Being over age 60.  Family history of stroke.  Previous history of blood clots, stroke, or transient ischemic attack (TIA).  Sickle cell disease.  Being a woman with a history of preeclampsia.  Migraine headache.  Sleep apnea.  Irregular heartbeats, such as atrial fibrillation.  Chronic inflammatory diseases, such as rheumatoid arthritis or lupus.  Blood clotting disorders (hypercoagulable state). What are the signs or symptoms? Symptoms of this condition usually develop suddenly, or you may notice them after waking up from sleep. Symptoms may include  sudden:  Weakness or numbness in your face, arm, or leg, especially on one side of your body.  Trouble walking or difficulty moving your arms or legs.  Loss of balance or coordination.  Confusion.  Slurred speech (dysarthria).  Trouble speaking, understanding speech, or both (aphasia).  Vision changes--such as double vision, blurred vision, or loss of vision--in one or both eyes.  Dizziness.  Nausea and vomiting.  Severe headache with no known cause. The headache is often described as the worst headache ever experienced. If possible, make note of the exact time that you last felt like your normal self and what time your symptoms started. Tell your health care provider. If symptoms come and go, this could be a sign of a warning stroke, or TIA. Get help right away, even if you feel better. How is this diagnosed? This condition may be diagnosed based on:  Your symptoms, your medical history, and a physical exam.  CT scan of the brain.  MRI.  CT angiogram. This test uses a computer to take X-rays of your arteries. A dye may be injected into your blood to show the inside of your blood vessels more clearly.  MRI angiogram. This is a type of MRI that is used to evaluate the blood vessels.  Cerebral angiogram. This test uses X-rays and a dye to show the blood vessels in the brain and neck. You may need to see a health care provider who specializes in stroke care. A stroke specialist can be seen in person or through communication using telephone or television technology (telemedicine). Other tests may also be done to find the cause of the stroke, such   as:  Electrocardiogram (ECG).  Continuous heart monitoring.  Echocardiogram.  Transesophageal echocardiogram (TEE).  Carotid ultrasound.  A scan of the brain circulation.  Blood tests.  Sleep study to check for sleep apnea. How is this treated? Treatment for this condition will depend on the duration, severity, and cause  of your symptoms and on the area of the brain affected. It is very important to get treatment at the first sign of stroke symptoms. Some treatments work better if they are done within 3-6 hours of the onset of stroke symptoms. These initial treatments may include:  Aspirin.  Medicines to control blood pressure.  Medicine given by injection to dissolve the blood clot (thrombolytic).  Treatments given directly to the affected artery to remove or dissolve the blood clot. Other treatment options may include:  Oxygen.  IV fluids.  Medicines to thin the blood (anticoagulants or antiplatelets).  Procedures to increase blood flow. Medicines and changes to your diet may be used to help treat and manage risk factors for stroke, such as diabetes, high cholesterol, and high blood pressure. After a stroke, you may work with physical, speech, mental health, or occupational therapists to help you recover. Follow these instructions at home: Medicines  Take over-the-counter and prescription medicines only as told by your health care provider.  If you were told to take a medicine to thin your blood, such as aspirin or an anticoagulant, take it exactly as told by your health care provider. ? Taking too much blood-thinning medicine can cause bleeding. ? If you do not take enough blood-thinning medicine, you will not have the protection that you need against another stroke and other problems.  Understand the side effects of taking anticoagulant medicine. When taking this type of medicine, make sure you: ? Hold pressure over any cuts for longer than usual. ? Tell your dentist and other health care providers that you are taking anticoagulants before you have any procedures that may cause bleeding. ? Avoid activities that may cause trauma or injury. Eating and drinking  Follow instructions from your health care provider about diet.  Eat healthy foods.  If your ability to swallow was affected by the  stroke, you may need to take steps to avoid choking, such as: ? Taking small bites when eating. ? Eating foods that are soft or pureed. Safety  Follow instructions from your health care team about physical activity.  Use a walker or cane as told by your health care provider.  Take steps to create a safe home environment in order to reduce the risk of falls. This may include: ? Having your home looked at by specialists. ? Installing grab bars in the bedroom and bathroom. ? Using safety equipment, such as raised toilets and a seat in the shower. General instructions  Do not use any tobacco products, such as cigarettes, chewing tobacco, and e-cigarettes. If you need help quitting, ask your health care provider.  Limit alcohol intake to no more than 1 drink a day for nonpregnant women and 2 drinks a day for men. One drink equals 12 oz of beer, 5 oz of wine, or 1 oz of hard liquor.  If you need help to stop using drugs or alcohol, ask your health care provider about a referral to a program or specialist.  Maintain an active and healthy lifestyle. Get regular exercise as told by your health care provider.  Keep all follow-up visits as told by your health care provider, including visits with all specialists on your   health care team. This is important. How is this prevented? Your risk of another stroke can be decreased by managing high blood pressure, high cholesterol, diabetes, heart disease, sleep apnea, and obesity. It can also be decreased by quitting smoking, limiting alcohol, and staying physically active. Your health care provider will continue to work with you on measures to prevent short-term and long-term complications of stroke. Get help right away if:   You have any symptoms of a stroke. "BE FAST" is an easy way to remember the main warning signs of a stroke: ? B - Balance. Signs are dizziness, sudden trouble walking, or loss of balance. ? E - Eyes. Signs are trouble seeing or a  sudden change in vision. ? F - Face. Signs are sudden weakness or numbness of the face, or the face or eyelid drooping on one side. ? A - Arms. Signs are weakness or numbness in an arm. This happens suddenly and usually on one side of the body. ? S - Speech. Signs are sudden trouble speaking, slurred speech, or trouble understanding what people say. ? T - Time. Time to call emergency services. Write down what time symptoms started.  You have other signs of a stroke, such as: ? A sudden, severe headache with no known cause. ? Nausea or vomiting. ? Seizure.  These symptoms may represent a serious problem that is an emergency. Do not wait to see if the symptoms will go away. Get medical help right away. Call your local emergency services (911 in the [place] Do not drive yourself to the hospital. Summary  An ischemic stroke (cerebrovascular accident, or CVA) is the sudden death of brain tissue that occurs when an area of the brain does not get enough oxygen.  Symptoms of this condition usually develop suddenly, or you may notice them after waking up from sleep.  It is very important to get treatment at the first sign of stroke symptoms. Stroke is a medical emergency that must be treated right away. This information is not intended to replace advice given to you by your health care provider. Make sure you discuss any questions you have with your health care provider. Document Revised: 10/28/2017 Document Reviewed: 05/07/2015 Elsevier Patient Education  2020 [employer]. Eating Plan After Stroke A stroke causes damage to the brain cells, which can affect your ability to walk, talk, and even eat. The impact of a stroke is different for everyone, and so is recovery. A good nutrition plan is important for your recovery. It can also lower your risk of another stroke. If you have difficulty chewing and swallowing your food, a dietitian or your stroke care team can help so that you can enjoy eating  healthy foods. What are tips for following this plan?  Reading food labels  Choose foods that have less than 300 milligrams (mg) of sodium per serving. Limit your sodium intake to less than 1,500 mg per day.  Avoid foods that have saturated fat and trans fat.  Choose foods that are low in cholesterol. Limit the amount of cholesterol you eat each day to less than 200 mg.  Choose foods that are high in fiber. Eat 20-30 grams (g) of fiber each day.  Avoid foods with added sugar. Check the food label for ingredients such as sugar, corn syrup, honey, fructose, molasses, and cane juice. Shopping  At the grocery store, buy most of your food from areas near the walls of the store. This includes: ? Fresh fruits and vegetables. ?  Dry grains, beans, nuts, and seeds. ? Fresh seafood, poultry, lean meats, and eggs. ? Low-fat dairy products.  Buy whole ingredients instead of prepackaged foods.  Buy fresh, in-season fruits and vegetables from local farmers markets.  Buy frozen fruits and vegetables in resealable bags. Cooking  Prepare foods with very little salt. Use herbs or salt-free spices instead.  Cook with heart-healthy oils, such as olive, avocado, canola, soybean, or sunflower oil.  Avoid frying foods. Bake, grill, or broil foods instead.  Remove visible fat and skin from meat and poultry before eating.  Modify food textures as told by your health care provider. Meal planning  Eat a wide variety of colorful fruits and vegetables. Make sure one-half of your plate is filled with fruits and vegetables at each meal.  Eat fruits and vegetables that are high in potassium, such as: ? Apples, bananas, oranges, and melon. ? Sweet potatoes, spinach, zucchini, and tomatoes.  Eat fish that contain heart-healthy fats (omega-3 fats) at least twice a week. These include salmon, tuna, mackerel, and sardines.  Eat plant foods that are high in omega-3 fats, such as flaxseeds and walnuts. Add  these to cereals, yogurt, or pasta dishes.  Eat several servings of high-fiber foods each day, such as fruits, vegetables, whole grains, and beans.  Do not put salt at the table for meals.  When eating out at restaurants: ? Ask the server about low-salt or salt-free food options. ? Avoid fried foods. Look for menu items that are grilled, steamed, broiled, or roasted. ? Ask if your food can be prepared without butter. ? Ask for condiments, such as salad dressings, gravy, or sauces to be served on the side.  If you have difficulty swallowing: ? Choose foods that are softer and easier to chew and swallow. ? Cut foods into small pieces and chew well before swallowing. ? Thicken liquids as told by your health care provider or dietitian. ? Let your health care provider know if your condition does not improve over time. You may need to work with a speech therapist to re-train the muscles that are used for eating. General recommendations  Involve your family and friends in your recovery, if possible. It may be helpful to have a slower meal time and to plan meals that include foods everyone in the family can eat.  Brush your teeth with fluoride toothpaste twice a day, and floss once a day. Keeping a clean mouth can help you swallow and can also help your appetite.  Drink enough water each day to keep your urine pale yellow. If needed, set reminders or ask your family to help you remember to drink water.  Limit alcohol intake to no more than 1 drink a day for nonpregnant women and 2 drinks a day for men. One drink equals 12 oz of beer, 5 oz of wine, or 1 oz of hard liquor. Summary  Following this eating plan can help in your stroke recovery and can decrease your risk for another stroke.  Let your health care provider know if you have problems with swallowing. You may need to work with a speech therapist. This information is not intended to replace advice given to you by your health care  provider. Make sure you discuss any questions you have with your health care provider. Document Revised: 06/01/2018 Document Reviewed: 04/18/2017 Elsevier Patient Education  2020 [employer] Hospital Discharge After a Stroke  Being discharged from the hospital after a stroke can feel overwhelming. Many things may be  different, and it is normal to feel scared or anxious. Some stroke survivors may be able to return to their homes, and others may need more specialized care on a temporary or permanent basis. Your stroke care team will work with you to develop a discharge plan that is best for you. Ask questions if you do not understand something. Invite a friend or family member to participate in discharge planning. Understanding and following your discharge plan can help to prevent another stroke or other problems. Understanding your medicines After a stroke, your health care provider may prescribe one or more types of medicine. It is important to take medicines exactly as told by your health care provider. Serious harm, such as another stroke, can happen if you are unable to take your medicine exactly as prescribed. Make sure you understand:  What medicine to take.  Why you are taking the medicine.  How and when to take it.  If it can be taken with your other medicines and herbal supplements.  Possible side effects.  When to call your health care provider if you have any side effects.  How you will get and pay for your medicines. Medical assistance programs may be able to help you pay for prescription medicines if you cannot afford them. If you are taking an anticoagulant, be sure to take it exactly as told by your health care provider. This type of medicine can increase the risk of bleeding because it works to prevent blood from clotting. You may need to take certain precautions to prevent bleeding. You should contact your health care provider if you have:  Bleeding or bruising.  A fall  or other injury to your head.  Blood in your urine or stool (feces). Planning for home safety  Take steps to prevent falls, such as installing grab bars or using a shower chair. Ask a friend or family member to get needed things in place before you go home if possible. A therapist can come to your home to make recommendations for safety equipment. Ask your health care provider if you would benefit from this service or from home care. Getting needed equipment Ask your health care provider for a list of any medical equipment and supplies you will need at home. These may include items such as:  Walkers.  Canes.  Wheelchairs.  Hand-strengthening devices.  Special eating utensils. Medical equipment can be rented or purchased, depending on your insurance coverage. Check with your insurance company about what is covered. Keeping follow-up visits After a stroke, you will need to follow up regularly with a health care provider. You may also need rehabilitation, which can include physical therapy, occupational therapy, or speech-language therapy. Keeping these appointments is very important to your recovery after a stroke. Be sure to bring your medicine list and discharge papers with you to your appointments. If you need help to keep track of your schedule, use a calendar or appointment reminder. Preventing another stroke Having a stroke puts you at risk for another stroke in the future. Ask your health care provider what actions you can take to lower the risk. These may include:  Increasing how much you exercise.  Making a healthy eating plan.  Quitting smoking.  Managing other health conditions, such as high blood pressure, high cholesterol, or diabetes.  Limiting alcohol use. Knowing the warning signs of a stroke  Make sure you understand the signs of a stroke. Before you leave the hospital, you will receive information outlining the stroke warning signs.  Share these with your friends  and family members. "BE FAST" is an easy way to remember the main warning signs of a stroke:  B - Balance. Signs are dizziness, sudden trouble walking, or loss of balance.  E - Eyes. Signs are trouble seeing or a sudden change in vision.  F - Face. Signs are sudden weakness or numbness of the face, or the face or eyelid drooping on one side.  A - Arms. Signs are weakness or numbness in an arm. This happens suddenly and usually on one side of the body.  S - Speech. Signs are sudden trouble speaking, slurred speech, or trouble understanding what people say.  T - Time. Time to call emergency services. Write down what time symptoms started. Other signs of stroke may include:  A sudden, severe headache with no known cause.  Nausea or vomiting.  Seizure. These symptoms may represent a serious problem that is an emergency. Do not wait to see if the symptoms will go away. Get medical help right away. Call your local emergency services (911 in the [place] Do not drive yourself to the hospital. Make note of the time that you had your first symptoms. Your emergency responders or emergency room staff will need to know this information. Summary  Being discharged from the hospital after a stroke can feel overwhelming. It is normal to feel scared or anxious.  Make sure you take medicines exactly as told by your health care provider.  Know the warning signs of a stroke, and get help right way if you have any of these symptoms. "BE FAST" is an easy way to remember the main warning signs of a stroke. This information is not intended to replace advice given to you by your health care provider. Make sure you discuss any questions you have with your health care provider. Document Revised: 11/01/2018 Document Reviewed: 05/14/2016 Elsevier Patient Education  2020 [employer]

## 2019-10-12 NOTE — Progress Notes (Signed)
Occupational Therapy Treatment Patient Details Name: [name] MRN: [id number] DOB: Jul 14, 1940 Today's Date: 10/12/2019    History of present illness [name] is a 79 y.o. female with PMH significant for HTN, HLD, prior history of stroke in 1994 with no residual deficits who presents with about 4-day history of left-sided weakness involving the left face and left arm. Pt found to have R caudate ischemic stroke.   OT comments  Pt progressing well with OT goals, demonstrating mobility to/from sink with RW and Min A for RW maneuvering due to L hand weakness. Pt demonstrated ability to complete grooming tasks with encouragement to incorporate L hand with min guard, standing for >10 minutes with no overt LOB. Pt continues to have L sided inattention, weakness, and impaired balance impacting ability to complete ADL, IADL and mobility tasks without assistance.    Follow Up Recommendations  SNF;Supervision/Assistance - 24 hour    Equipment Recommendations  Other (comment) (TBD)    Recommendations for Other Services      Precautions / Restrictions Precautions Precautions: Fall;Other (comment) Precaution Comments: L hemi and inattention Restrictions Weight Bearing Restrictions: No       Mobility Bed Mobility Overal bed mobility: Needs Assistance Bed Mobility: Supine to Sit     Supine to sit: Supervision     General bed mobility comments: Supervision with greatly increased time/effort  Transfers Overall transfer level: Needs assistance Equipment used: Rolling walker (2 wheeled) Transfers: Sit to/from Stand Sit to Stand: Mod assist         General transfer comment: mod to control power up and to help manage the LUE to grasp walker    Balance Overall balance assessment: Needs assistance Sitting-balance support: Feet supported Sitting balance-Leahy Scale: Fair Sitting balance - Comments: fair no LOB   Standing balance support: Bilateral upper extremity  supported Standing balance-Leahy Scale: Poor Standing balance comment: reliant on UE support using RW/sink                           ADL either performed or assessed with clinical judgement   ADL Overall ADL's : Needs assistance/impaired     Grooming: Min guard;Standing;Wash/dry face;Oral care Grooming Details (indicate cue type and reason): Encouraged pt to incorporate L hand into tasks, demo ability to hold dentures with min guard. min guard for washing face, hands and denture care standing at sink>10 minutes. No LOB                             Functional mobility during ADLs: Minimal assistance;Rolling walker;Cueing for sequencing General ADL Comments: Pt with much improved mobility and improving strength of L UE, noted decreased awareness of where L UE is at times     Vision   Vision Assessment?: Vision impaired- to be further tested in functional context   Perception     Praxis      Cognition Arousal/Alertness: Awake/alert Behavior During Therapy: Flat affect Overall Cognitive Status: Impaired/Different from baseline Area of Impairment: Awareness;Safety/judgement;Following commands;Problem solving                   Current Attention Level: Selective   Following Commands: Follows one step commands with increased time Safety/Judgement: Decreased awareness of safety;Decreased awareness of deficits Awareness: Emergent Problem Solving: Slow processing;Decreased initiation;Difficulty sequencing;Requires verbal cues;Requires tactile cues General Comments: Pt slow moving and requires increased time to perform tasks  Exercises     Shoulder Instructions       General Comments      Pertinent Vitals/ Pain       Pain Assessment: No/denies pain  Home Living                                          Prior Functioning/Environment              Frequency  Min 2X/week        Progress Toward Goals  OT  Goals(current goals can now be found in the care plan section)  Progress towards OT goals: Progressing toward goals  Acute Rehab OT Goals Patient Stated Goal: recover independence, gait OT Goal Formulation: With patient Time For Goal Achievement: 10/23/19 Potential to Achieve Goals: Good ADL Goals Pt Will Perform Eating: with set-up;sitting Pt Will Perform Grooming: with min guard assist;standing Pt Will Perform Lower Body Bathing: with min assist;sitting/lateral leans;sit to/from stand Pt Will Perform Upper Body Dressing: with supervision;sitting Pt Will Transfer to Toilet: with min assist;stand pivot transfer;bedside commode Pt Will Perform Toileting - Clothing Manipulation and hygiene: with min assist;sitting/lateral leans;sit to/from stand  Plan Discharge plan remains appropriate    Co-evaluation                 AM-PAC OT "6 Clicks" Daily Activity     Outcome Measure   Help from another person eating meals?: A Little Help from another person taking care of personal grooming?: A Little Help from another person toileting, which includes using toliet, bedpan, or urinal?: A Lot Help from another person bathing (including washing, rinsing, drying)?: A Lot Help from another person to put on and taking off regular upper body clothing?: A Little Help from another person to put on and taking off regular lower body clothing?: A Lot 6 Click Score: 15    End of Session Equipment Utilized During Treatment: Gait belt;Rolling walker  OT Visit Diagnosis: Unsteadiness on feet (R26.81);Other abnormalities of gait and mobility (R26.89);Muscle weakness (generalized) (M62.81);History of falling (Z91.81);Ataxia, unspecified (R27.0);Apraxia (R48.2);Other symptoms and signs involving cognitive function;Hemiplegia and hemiparesis Hemiplegia - Right/Left: Left Hemiplegia - dominant/non-dominant: Non-Dominant Hemiplegia - caused by: Cerebral infarction   Activity Tolerance Patient tolerated  treatment well   Patient Left in bed;with call bell/phone within reach;with chair alarm set   Nurse Communication Mobility status        Time: 8676-1950 OT Time Calculation (min): 23 min  Charges: OT General Charges $OT Visit: 1 Visit OT Treatments $Self Care/Home Management : 23-37 mins  Lorre Munroe, OTR/L   Lorre Munroe 10/12/2019, 9:42 AM

## 2019-10-12 NOTE — Social Work (Signed)
Clinical Social Worker facilitated patient discharge including contacting patient family and facility to confirm patient discharge plans.  Clinical information faxed to facility and family agreeable with plan.  CSW arranged ambulance transport via [facility] to [facility] rm 110. RN to call [phone]  with report prior to discharge.  Clinical Social Worker will sign off for now as social work intervention is no longer needed. Please consult [place] again if new need arises.  Octavio Graves, MSW, LCSW Clinical Social Worker

## 2019-10-12 NOTE — Care Management Important Message (Signed)
Important Message  Patient Details  Name: [name] MRN: [id number] Date of Birth: [birth year]   Medicare Important Message Given:  Yes     Dorena Bodo 10/12/2019, 1:39 PM

## 2019-10-12 NOTE — Progress Notes (Signed)
Physical Therapy Treatment Patient Details Name: [name] MRN: [id number] DOB: 1940/11/18 Today's Date: 10/12/2019    History of Present Illness [name] is a 79 y.o. female with PMH significant for HTN, HLD, prior history of stroke in 1994 with no residual deficits who presents with about 4-day history of left-sided weakness involving the left face and left arm. Pt found to have R caudate ischemic stroke.    PT Comments    Pt making good progress with therapy today, more vocal through session with improved engagement and willingness to try new exercises and balance activities. The pt continues to ambulate slowly, but reports no fatigue following short, 30 ft ambulation and did not require assist to maintain LUE on the RW grip other than initial placement. The pt was challenged by backwards walking and demos continued difficulty with movement, poor clearance, and limited step length of the LLE. The pt will continue to benefit from skilled PT to further progress functional strength, stability, and gait pattern to reduce risk of falls and improve independence following hospital stay.     Follow Up Recommendations  SNF     Equipment Recommendations  Rolling walker with 5" wheels;3in1 (PT)    Recommendations for Other Services       Precautions / Restrictions Precautions Precautions: Fall;Other (comment) Precaution Comments: L hemi and inattention Restrictions Weight Bearing Restrictions: No Other Position/Activity Restrictions: L hand grip is weak    Mobility  Bed Mobility Overal bed mobility: Needs Assistance Bed Mobility: Supine to Sit     Supine to sit: Supervision     General bed mobility comments: pt up in recliner at start of session  Transfers Overall transfer level: Needs assistance Equipment used: Rolling walker (2 wheeled) Transfers: Sit to/from Stand Sit to Stand: Mod assist         General transfer comment: modA to power up and to control eccentric  lower. cues for hand positioning  Ambulation/Gait Ambulation/Gait assistance: Min assist Gait Distance (Feet): 30 Feet Assistive device: Rolling walker (2 wheeled) Gait Pattern/deviations: Decreased step length - right;Decreased step length - left;Decreased stride length;Decreased dorsiflexion - left;Decreased weight shift to left   Gait velocity interpretation: <1.31 ft/sec, indicative of household ambulator General Gait Details: Decreased step length and DF with LLE despite cues. The pt has poor clearance (especially with LLE), and requires intermittant assist to LUE to maintain grip on the RW    Modified Rankin (Stroke Patients Only) Modified Rankin (Stroke Patients Only) Pre-Morbid Rankin Score: No significant disability Modified Rankin: Moderately severe disability     Balance Overall balance assessment: Needs assistance Sitting-balance support: Feet supported Sitting balance-Leahy Scale: Fair Sitting balance - Comments: fair no LOB   Standing balance support: Bilateral upper extremity supported Standing balance-Leahy Scale: Poor Standing balance comment: reliant on UE support using RW             High level balance activites: Backward walking High Level Balance Comments: poor stride length and significantly decreased clearance with LLE, modA for stability and RW management            Cognition Arousal/Alertness: Awake/alert Behavior During Therapy: Flat affect Overall Cognitive Status: Impaired/Different from baseline Area of Impairment: Awareness;Safety/judgement;Following commands;Problem solving                   Current Attention Level: Selective   Following Commands: Follows one step commands with increased time Safety/Judgement: Decreased awareness of safety;Decreased awareness of deficits Awareness: Emergent Problem Solving: Slow processing;Decreased initiation;Difficulty  sequencing;Requires verbal cues;Requires tactile cues General Comments:  Pt slow moving and requires increased time to perform tasks      Exercises General Exercises - Lower Extremity Long Arc Quad: AROM;Both;10 reps;Seated Heel Slides: AROM;Both;Seated;10 reps Heel Raises: AROM;Both;10 reps;Seated    General Comments General comments (skin integrity, edema, etc.): transport present at end of session to take the PT      Pertinent Vitals/Pain Pain Assessment: No/denies pain Pain Intervention(s): Monitored during session           PT Goals (current goals can now be found in the care plan section) Acute Rehab PT Goals Patient Stated Goal: recover independence, gait PT Goal Formulation: With patient Time For Goal Achievement: 10/23/19 Potential to Achieve Goals: Good Progress towards PT goals: Progressing toward goals    Frequency    Min 4X/week      PT Plan Current plan remains appropriate       AM-PAC PT "6 Clicks" Mobility   Outcome Measure  Help needed turning from your back to your side while in a flat bed without using bedrails?: A Little Help needed moving from lying on your back to sitting on the side of a flat bed without using bedrails?: A Lot Help needed moving to and from a bed to a chair (including a wheelchair)?: A Lot Help needed standing up from a chair using your arms (e.g., wheelchair or bedside chair)?: A Little Help needed to walk in hospital room?: A Little Help needed climbing 3-5 steps with a railing? : A Lot 6 Click Score: 15    End of Session Equipment Utilized During Treatment: Gait belt Activity Tolerance: Patient tolerated treatment well Patient left:  (with transport, leaving hospital) Nurse Communication: Mobility status PT Visit Diagnosis: Unsteadiness on feet (R26.81);Muscle weakness (generalized) (M62.81);Hemiplegia and hemiparesis Hemiplegia - Right/Left: Left Hemiplegia - dominant/non-dominant: Non-dominant Hemiplegia - caused by: Cerebral infarction     Time: 4650-3546 PT Time Calculation  (min) (ACUTE ONLY): 18 min  Charges:  $Gait Training: 8-22 mins                     Rolm Baptise, PT, DPT   Acute Rehabilitation Department Pager #: [phone]   Gaetana Michaelis 10/12/2019, 12:55 PM

## 2019-10-12 NOTE — TOC Transition Note (Signed)
Transition of Care [facility]) - CM/SW Discharge Note   Patient Details  Name: [name] MRN: [id number] Date of Birth: 11-17-1940  [facility] CM/SW Contact:  Doy Hutching, LCSW Phone Number: 10/12/2019, 10:23 AM   Clinical Narrative:    CSW spoke with MD, pt cleared for d/c, all paperwork sent through hub to SNF, no other paperwork noted as needed. [facility] papers placed on chart. Confirmed d/c after 12 with [facility] admissions liaison Tresa Endo. CSW arranged [facility] for 12:30pm. RN Miriam aware and will call report. Message left w/ pt sister [name].   Final next level of care: Skilled Nursing Facility Barriers to Discharge: Barriers Resolved   Patient Goals and CMS Choice Patient states their goals for this hospitalization and ongoing recovery are:: get stronger before going home CMS Medicare.gov Compare Post Acute Care list provided to:: Patient Choice offered to / list presented to : Patient, Sibling  Discharge Placement PASRR number recieved: 10/10/19            Patient chooses bed at: [facility] Patient to be transferred to facility by: [facility] Name of family member notified: pt sister [name] [phone] Patient and family notified of of transfer: 10/12/19  Discharge Plan and Services In-house Referral: Clinical Social Work Discharge Planning Services: CM Consult  Readmission Risk Interventions Readmission Risk Prevention Plan 10/09/2019  Post Dischage Appt Not Complete  Appt Comments rec for SNF  Medication Screening Complete  Transportation Screening Complete  Some recent data might be hidden

## 2019-10-12 NOTE — Progress Notes (Signed)
RN called and gave report to Ola, RN at [facility], he understood report. IV has been removed from the patient family at bedside collecting all her belongings.

## 2019-10-18 DIAGNOSIS — G8194 Hemiplegia, unspecified affecting left nondominant side: Secondary | ICD-10-CM | POA: Diagnosis not present

## 2019-10-18 DIAGNOSIS — E785 Hyperlipidemia, unspecified: Secondary | ICD-10-CM | POA: Diagnosis not present

## 2019-10-18 DIAGNOSIS — I10 Essential (primary) hypertension: Secondary | ICD-10-CM | POA: Diagnosis not present

## 2019-10-26 DIAGNOSIS — I69811 Memory deficit following other cerebrovascular disease: Secondary | ICD-10-CM | POA: Diagnosis not present

## 2019-10-26 DIAGNOSIS — I69891 Dysphagia following other cerebrovascular disease: Secondary | ICD-10-CM | POA: Diagnosis not present

## 2019-10-26 DIAGNOSIS — I739 Peripheral vascular disease, unspecified: Secondary | ICD-10-CM | POA: Diagnosis not present

## 2019-10-26 DIAGNOSIS — I6930 Unspecified sequelae of cerebral infarction: Secondary | ICD-10-CM | POA: Diagnosis not present

## 2019-10-26 DIAGNOSIS — F339 Major depressive disorder, recurrent, unspecified: Secondary | ICD-10-CM | POA: Diagnosis not present

## 2019-10-26 DIAGNOSIS — K219 Gastro-esophageal reflux disease without esophagitis: Secondary | ICD-10-CM | POA: Diagnosis not present

## 2019-10-26 DIAGNOSIS — I10 Essential (primary) hypertension: Secondary | ICD-10-CM | POA: Diagnosis not present

## 2019-10-26 DIAGNOSIS — N3281 Overactive bladder: Secondary | ICD-10-CM | POA: Diagnosis not present

## 2019-10-26 DIAGNOSIS — M6281 Muscle weakness (generalized): Secondary | ICD-10-CM | POA: Diagnosis not present

## 2019-10-28 DIAGNOSIS — M6281 Muscle weakness (generalized): Secondary | ICD-10-CM | POA: Diagnosis not present

## 2019-10-28 DIAGNOSIS — I639 Cerebral infarction, unspecified: Secondary | ICD-10-CM | POA: Diagnosis not present

## 2019-10-31 ENCOUNTER — Other Ambulatory Visit: Payer: Self-pay

## 2019-10-31 DIAGNOSIS — I10 Essential (primary) hypertension: Secondary | ICD-10-CM | POA: Diagnosis not present

## 2019-10-31 DIAGNOSIS — E785 Hyperlipidemia, unspecified: Secondary | ICD-10-CM | POA: Diagnosis not present

## 2019-10-31 DIAGNOSIS — I69354 Hemiplegia and hemiparesis following cerebral infarction affecting left non-dominant side: Secondary | ICD-10-CM | POA: Diagnosis not present

## 2019-10-31 DIAGNOSIS — I69392 Facial weakness following cerebral infarction: Secondary | ICD-10-CM | POA: Diagnosis not present

## 2019-10-31 DIAGNOSIS — R1312 Dysphagia, oropharyngeal phase: Secondary | ICD-10-CM | POA: Diagnosis not present

## 2019-10-31 DIAGNOSIS — I720 Aneurysm of carotid artery: Secondary | ICD-10-CM | POA: Diagnosis not present

## 2019-10-31 DIAGNOSIS — I6523 Occlusion and stenosis of bilateral carotid arteries: Secondary | ICD-10-CM | POA: Diagnosis not present

## 2019-10-31 DIAGNOSIS — I119 Hypertensive heart disease without heart failure: Secondary | ICD-10-CM | POA: Diagnosis not present

## 2019-10-31 DIAGNOSIS — I739 Peripheral vascular disease, unspecified: Secondary | ICD-10-CM | POA: Diagnosis not present

## 2019-10-31 DIAGNOSIS — I69391 Dysphagia following cerebral infarction: Secondary | ICD-10-CM | POA: Diagnosis not present

## 2019-10-31 NOTE — Patient Outreach (Signed)
[facility]) Care Management  10/31/2019  ROLANDA CAMPA [birth year] [id number]     Transition of Care Referral  Referral Date: 10/31/19 Referral Source: [facility] Discharge Report Date of Discharge: 10/28/19 Facility: [facility] Insurance: [facility]    Referral received. Transition of care calls being completed via EMMI-automated calls. RN CM will outreach patient for any red flags received.    Plan: RN CM will close case at this time.    Antionette Fairy, RN,BSN,CCM [facility] Care Management Telephonic Care Management Coordinator Direct Phone: [phone] Toll Free: [phone] Fax: [phone]

## 2019-11-02 DIAGNOSIS — R1312 Dysphagia, oropharyngeal phase: Secondary | ICD-10-CM | POA: Diagnosis not present

## 2019-11-02 DIAGNOSIS — I739 Peripheral vascular disease, unspecified: Secondary | ICD-10-CM | POA: Diagnosis not present

## 2019-11-02 DIAGNOSIS — I69392 Facial weakness following cerebral infarction: Secondary | ICD-10-CM | POA: Diagnosis not present

## 2019-11-02 DIAGNOSIS — I6523 Occlusion and stenosis of bilateral carotid arteries: Secondary | ICD-10-CM | POA: Diagnosis not present

## 2019-11-02 DIAGNOSIS — I119 Hypertensive heart disease without heart failure: Secondary | ICD-10-CM | POA: Diagnosis not present

## 2019-11-02 DIAGNOSIS — E785 Hyperlipidemia, unspecified: Secondary | ICD-10-CM | POA: Diagnosis not present

## 2019-11-02 DIAGNOSIS — I69391 Dysphagia following cerebral infarction: Secondary | ICD-10-CM | POA: Diagnosis not present

## 2019-11-02 DIAGNOSIS — I720 Aneurysm of carotid artery: Secondary | ICD-10-CM | POA: Diagnosis not present

## 2019-11-02 DIAGNOSIS — I69354 Hemiplegia and hemiparesis following cerebral infarction affecting left non-dominant side: Secondary | ICD-10-CM | POA: Diagnosis not present

## 2019-11-05 DIAGNOSIS — I6523 Occlusion and stenosis of bilateral carotid arteries: Secondary | ICD-10-CM | POA: Diagnosis not present

## 2019-11-05 DIAGNOSIS — I69392 Facial weakness following cerebral infarction: Secondary | ICD-10-CM | POA: Diagnosis not present

## 2019-11-05 DIAGNOSIS — I739 Peripheral vascular disease, unspecified: Secondary | ICD-10-CM | POA: Diagnosis not present

## 2019-11-05 DIAGNOSIS — I720 Aneurysm of carotid artery: Secondary | ICD-10-CM | POA: Diagnosis not present

## 2019-11-05 DIAGNOSIS — E785 Hyperlipidemia, unspecified: Secondary | ICD-10-CM | POA: Diagnosis not present

## 2019-11-05 DIAGNOSIS — I69391 Dysphagia following cerebral infarction: Secondary | ICD-10-CM | POA: Diagnosis not present

## 2019-11-05 DIAGNOSIS — R1312 Dysphagia, oropharyngeal phase: Secondary | ICD-10-CM | POA: Diagnosis not present

## 2019-11-05 DIAGNOSIS — I119 Hypertensive heart disease without heart failure: Secondary | ICD-10-CM | POA: Diagnosis not present

## 2019-11-05 DIAGNOSIS — I69354 Hemiplegia and hemiparesis following cerebral infarction affecting left non-dominant side: Secondary | ICD-10-CM | POA: Diagnosis not present

## 2019-11-06 DIAGNOSIS — I6523 Occlusion and stenosis of bilateral carotid arteries: Secondary | ICD-10-CM | POA: Diagnosis not present

## 2019-11-06 DIAGNOSIS — I119 Hypertensive heart disease without heart failure: Secondary | ICD-10-CM | POA: Diagnosis not present

## 2019-11-06 DIAGNOSIS — I739 Peripheral vascular disease, unspecified: Secondary | ICD-10-CM | POA: Diagnosis not present

## 2019-11-06 DIAGNOSIS — I720 Aneurysm of carotid artery: Secondary | ICD-10-CM | POA: Diagnosis not present

## 2019-11-06 DIAGNOSIS — I69392 Facial weakness following cerebral infarction: Secondary | ICD-10-CM | POA: Diagnosis not present

## 2019-11-06 DIAGNOSIS — I69354 Hemiplegia and hemiparesis following cerebral infarction affecting left non-dominant side: Secondary | ICD-10-CM | POA: Diagnosis not present

## 2019-11-06 DIAGNOSIS — E785 Hyperlipidemia, unspecified: Secondary | ICD-10-CM | POA: Diagnosis not present

## 2019-11-06 DIAGNOSIS — R1312 Dysphagia, oropharyngeal phase: Secondary | ICD-10-CM | POA: Diagnosis not present

## 2019-11-06 DIAGNOSIS — I69391 Dysphagia following cerebral infarction: Secondary | ICD-10-CM | POA: Diagnosis not present

## 2019-11-07 DIAGNOSIS — I69391 Dysphagia following cerebral infarction: Secondary | ICD-10-CM | POA: Diagnosis not present

## 2019-11-07 DIAGNOSIS — I720 Aneurysm of carotid artery: Secondary | ICD-10-CM | POA: Diagnosis not present

## 2019-11-07 DIAGNOSIS — I6523 Occlusion and stenosis of bilateral carotid arteries: Secondary | ICD-10-CM | POA: Diagnosis not present

## 2019-11-07 DIAGNOSIS — I119 Hypertensive heart disease without heart failure: Secondary | ICD-10-CM | POA: Diagnosis not present

## 2019-11-07 DIAGNOSIS — I739 Peripheral vascular disease, unspecified: Secondary | ICD-10-CM | POA: Diagnosis not present

## 2019-11-07 DIAGNOSIS — E785 Hyperlipidemia, unspecified: Secondary | ICD-10-CM | POA: Diagnosis not present

## 2019-11-07 DIAGNOSIS — R1312 Dysphagia, oropharyngeal phase: Secondary | ICD-10-CM | POA: Diagnosis not present

## 2019-11-07 DIAGNOSIS — I69392 Facial weakness following cerebral infarction: Secondary | ICD-10-CM | POA: Diagnosis not present

## 2019-11-07 DIAGNOSIS — I69354 Hemiplegia and hemiparesis following cerebral infarction affecting left non-dominant side: Secondary | ICD-10-CM | POA: Diagnosis not present

## 2019-11-08 DIAGNOSIS — Z8673 Personal history of transient ischemic attack (TIA), and cerebral infarction without residual deficits: Secondary | ICD-10-CM | POA: Diagnosis not present

## 2019-11-08 DIAGNOSIS — Z9114 Patient's other noncompliance with medication regimen: Secondary | ICD-10-CM | POA: Diagnosis not present

## 2019-11-08 DIAGNOSIS — I10 Essential (primary) hypertension: Secondary | ICD-10-CM | POA: Diagnosis not present

## 2019-11-08 DIAGNOSIS — F17200 Nicotine dependence, unspecified, uncomplicated: Secondary | ICD-10-CM | POA: Diagnosis not present

## 2019-11-08 DIAGNOSIS — I739 Peripheral vascular disease, unspecified: Secondary | ICD-10-CM | POA: Diagnosis not present

## 2019-11-08 DIAGNOSIS — E785 Hyperlipidemia, unspecified: Secondary | ICD-10-CM | POA: Diagnosis not present

## 2019-11-08 DIAGNOSIS — R7301 Impaired fasting glucose: Secondary | ICD-10-CM | POA: Diagnosis not present

## 2019-11-08 DIAGNOSIS — I69354 Hemiplegia and hemiparesis following cerebral infarction affecting left non-dominant side: Secondary | ICD-10-CM | POA: Diagnosis not present

## 2019-11-09 DIAGNOSIS — R1312 Dysphagia, oropharyngeal phase: Secondary | ICD-10-CM | POA: Diagnosis not present

## 2019-11-09 DIAGNOSIS — I739 Peripheral vascular disease, unspecified: Secondary | ICD-10-CM | POA: Diagnosis not present

## 2019-11-09 DIAGNOSIS — I6523 Occlusion and stenosis of bilateral carotid arteries: Secondary | ICD-10-CM | POA: Diagnosis not present

## 2019-11-09 DIAGNOSIS — E785 Hyperlipidemia, unspecified: Secondary | ICD-10-CM | POA: Diagnosis not present

## 2019-11-09 DIAGNOSIS — I69354 Hemiplegia and hemiparesis following cerebral infarction affecting left non-dominant side: Secondary | ICD-10-CM | POA: Diagnosis not present

## 2019-11-09 DIAGNOSIS — I119 Hypertensive heart disease without heart failure: Secondary | ICD-10-CM | POA: Diagnosis not present

## 2019-11-09 DIAGNOSIS — I69392 Facial weakness following cerebral infarction: Secondary | ICD-10-CM | POA: Diagnosis not present

## 2019-11-09 DIAGNOSIS — I720 Aneurysm of carotid artery: Secondary | ICD-10-CM | POA: Diagnosis not present

## 2019-11-09 DIAGNOSIS — I69391 Dysphagia following cerebral infarction: Secondary | ICD-10-CM | POA: Diagnosis not present

## 2019-11-12 DIAGNOSIS — I69392 Facial weakness following cerebral infarction: Secondary | ICD-10-CM | POA: Diagnosis not present

## 2019-11-12 DIAGNOSIS — I69354 Hemiplegia and hemiparesis following cerebral infarction affecting left non-dominant side: Secondary | ICD-10-CM | POA: Diagnosis not present

## 2019-11-12 DIAGNOSIS — I720 Aneurysm of carotid artery: Secondary | ICD-10-CM | POA: Diagnosis not present

## 2019-11-12 DIAGNOSIS — I6523 Occlusion and stenosis of bilateral carotid arteries: Secondary | ICD-10-CM | POA: Diagnosis not present

## 2019-11-12 DIAGNOSIS — I119 Hypertensive heart disease without heart failure: Secondary | ICD-10-CM | POA: Diagnosis not present

## 2019-11-12 DIAGNOSIS — E785 Hyperlipidemia, unspecified: Secondary | ICD-10-CM | POA: Diagnosis not present

## 2019-11-12 DIAGNOSIS — R1312 Dysphagia, oropharyngeal phase: Secondary | ICD-10-CM | POA: Diagnosis not present

## 2019-11-12 DIAGNOSIS — I739 Peripheral vascular disease, unspecified: Secondary | ICD-10-CM | POA: Diagnosis not present

## 2019-11-12 DIAGNOSIS — I69391 Dysphagia following cerebral infarction: Secondary | ICD-10-CM | POA: Diagnosis not present

## 2019-11-14 DIAGNOSIS — I119 Hypertensive heart disease without heart failure: Secondary | ICD-10-CM | POA: Diagnosis not present

## 2019-11-14 DIAGNOSIS — I6523 Occlusion and stenosis of bilateral carotid arteries: Secondary | ICD-10-CM | POA: Diagnosis not present

## 2019-11-14 DIAGNOSIS — E785 Hyperlipidemia, unspecified: Secondary | ICD-10-CM | POA: Diagnosis not present

## 2019-11-14 DIAGNOSIS — I69391 Dysphagia following cerebral infarction: Secondary | ICD-10-CM | POA: Diagnosis not present

## 2019-11-14 DIAGNOSIS — R1312 Dysphagia, oropharyngeal phase: Secondary | ICD-10-CM | POA: Diagnosis not present

## 2019-11-14 DIAGNOSIS — I69392 Facial weakness following cerebral infarction: Secondary | ICD-10-CM | POA: Diagnosis not present

## 2019-11-14 DIAGNOSIS — I720 Aneurysm of carotid artery: Secondary | ICD-10-CM | POA: Diagnosis not present

## 2019-11-14 DIAGNOSIS — I69354 Hemiplegia and hemiparesis following cerebral infarction affecting left non-dominant side: Secondary | ICD-10-CM | POA: Diagnosis not present

## 2019-11-14 DIAGNOSIS — I739 Peripheral vascular disease, unspecified: Secondary | ICD-10-CM | POA: Diagnosis not present

## 2019-11-15 DIAGNOSIS — I720 Aneurysm of carotid artery: Secondary | ICD-10-CM | POA: Diagnosis not present

## 2019-11-15 DIAGNOSIS — I69391 Dysphagia following cerebral infarction: Secondary | ICD-10-CM | POA: Diagnosis not present

## 2019-11-15 DIAGNOSIS — I69392 Facial weakness following cerebral infarction: Secondary | ICD-10-CM | POA: Diagnosis not present

## 2019-11-15 DIAGNOSIS — I739 Peripheral vascular disease, unspecified: Secondary | ICD-10-CM | POA: Diagnosis not present

## 2019-11-15 DIAGNOSIS — R1312 Dysphagia, oropharyngeal phase: Secondary | ICD-10-CM | POA: Diagnosis not present

## 2019-11-15 DIAGNOSIS — E785 Hyperlipidemia, unspecified: Secondary | ICD-10-CM | POA: Diagnosis not present

## 2019-11-15 DIAGNOSIS — I69354 Hemiplegia and hemiparesis following cerebral infarction affecting left non-dominant side: Secondary | ICD-10-CM | POA: Diagnosis not present

## 2019-11-15 DIAGNOSIS — I6523 Occlusion and stenosis of bilateral carotid arteries: Secondary | ICD-10-CM | POA: Diagnosis not present

## 2019-11-15 DIAGNOSIS — I119 Hypertensive heart disease without heart failure: Secondary | ICD-10-CM | POA: Diagnosis not present

## 2019-11-21 DIAGNOSIS — I69392 Facial weakness following cerebral infarction: Secondary | ICD-10-CM | POA: Diagnosis not present

## 2019-11-21 DIAGNOSIS — I69354 Hemiplegia and hemiparesis following cerebral infarction affecting left non-dominant side: Secondary | ICD-10-CM | POA: Diagnosis not present

## 2019-11-21 DIAGNOSIS — I69391 Dysphagia following cerebral infarction: Secondary | ICD-10-CM | POA: Diagnosis not present

## 2019-11-21 DIAGNOSIS — R1312 Dysphagia, oropharyngeal phase: Secondary | ICD-10-CM | POA: Diagnosis not present

## 2019-11-21 DIAGNOSIS — E785 Hyperlipidemia, unspecified: Secondary | ICD-10-CM | POA: Diagnosis not present

## 2019-11-21 DIAGNOSIS — I739 Peripheral vascular disease, unspecified: Secondary | ICD-10-CM | POA: Diagnosis not present

## 2019-11-21 DIAGNOSIS — I6523 Occlusion and stenosis of bilateral carotid arteries: Secondary | ICD-10-CM | POA: Diagnosis not present

## 2019-11-21 DIAGNOSIS — I720 Aneurysm of carotid artery: Secondary | ICD-10-CM | POA: Diagnosis not present

## 2019-11-21 DIAGNOSIS — I119 Hypertensive heart disease without heart failure: Secondary | ICD-10-CM | POA: Diagnosis not present

## 2019-11-23 DIAGNOSIS — I739 Peripheral vascular disease, unspecified: Secondary | ICD-10-CM | POA: Diagnosis not present

## 2019-11-23 DIAGNOSIS — I720 Aneurysm of carotid artery: Secondary | ICD-10-CM | POA: Diagnosis not present

## 2019-11-23 DIAGNOSIS — I119 Hypertensive heart disease without heart failure: Secondary | ICD-10-CM | POA: Diagnosis not present

## 2019-11-23 DIAGNOSIS — I69392 Facial weakness following cerebral infarction: Secondary | ICD-10-CM | POA: Diagnosis not present

## 2019-11-23 DIAGNOSIS — E785 Hyperlipidemia, unspecified: Secondary | ICD-10-CM | POA: Diagnosis not present

## 2019-11-23 DIAGNOSIS — I69354 Hemiplegia and hemiparesis following cerebral infarction affecting left non-dominant side: Secondary | ICD-10-CM | POA: Diagnosis not present

## 2019-11-23 DIAGNOSIS — R1312 Dysphagia, oropharyngeal phase: Secondary | ICD-10-CM | POA: Diagnosis not present

## 2019-11-23 DIAGNOSIS — I6523 Occlusion and stenosis of bilateral carotid arteries: Secondary | ICD-10-CM | POA: Diagnosis not present

## 2019-11-23 DIAGNOSIS — I69391 Dysphagia following cerebral infarction: Secondary | ICD-10-CM | POA: Diagnosis not present

## 2019-11-28 DIAGNOSIS — I739 Peripheral vascular disease, unspecified: Secondary | ICD-10-CM | POA: Diagnosis not present

## 2019-11-28 DIAGNOSIS — I119 Hypertensive heart disease without heart failure: Secondary | ICD-10-CM | POA: Diagnosis not present

## 2019-11-28 DIAGNOSIS — I69392 Facial weakness following cerebral infarction: Secondary | ICD-10-CM | POA: Diagnosis not present

## 2019-11-28 DIAGNOSIS — I69354 Hemiplegia and hemiparesis following cerebral infarction affecting left non-dominant side: Secondary | ICD-10-CM | POA: Diagnosis not present

## 2019-11-28 DIAGNOSIS — I6523 Occlusion and stenosis of bilateral carotid arteries: Secondary | ICD-10-CM | POA: Diagnosis not present

## 2019-11-28 DIAGNOSIS — R1312 Dysphagia, oropharyngeal phase: Secondary | ICD-10-CM | POA: Diagnosis not present

## 2019-11-28 DIAGNOSIS — I69391 Dysphagia following cerebral infarction: Secondary | ICD-10-CM | POA: Diagnosis not present

## 2019-11-28 DIAGNOSIS — I720 Aneurysm of carotid artery: Secondary | ICD-10-CM | POA: Diagnosis not present

## 2019-11-28 DIAGNOSIS — E785 Hyperlipidemia, unspecified: Secondary | ICD-10-CM | POA: Diagnosis not present

## 2019-11-30 DIAGNOSIS — I69391 Dysphagia following cerebral infarction: Secondary | ICD-10-CM | POA: Diagnosis not present

## 2019-11-30 DIAGNOSIS — I119 Hypertensive heart disease without heart failure: Secondary | ICD-10-CM | POA: Diagnosis not present

## 2019-11-30 DIAGNOSIS — R1312 Dysphagia, oropharyngeal phase: Secondary | ICD-10-CM | POA: Diagnosis not present

## 2019-11-30 DIAGNOSIS — I69354 Hemiplegia and hemiparesis following cerebral infarction affecting left non-dominant side: Secondary | ICD-10-CM | POA: Diagnosis not present

## 2019-11-30 DIAGNOSIS — E785 Hyperlipidemia, unspecified: Secondary | ICD-10-CM | POA: Diagnosis not present

## 2019-11-30 DIAGNOSIS — I720 Aneurysm of carotid artery: Secondary | ICD-10-CM | POA: Diagnosis not present

## 2019-11-30 DIAGNOSIS — I6523 Occlusion and stenosis of bilateral carotid arteries: Secondary | ICD-10-CM | POA: Diagnosis not present

## 2019-11-30 DIAGNOSIS — I69392 Facial weakness following cerebral infarction: Secondary | ICD-10-CM | POA: Diagnosis not present

## 2019-11-30 DIAGNOSIS — I739 Peripheral vascular disease, unspecified: Secondary | ICD-10-CM | POA: Diagnosis not present

## 2019-12-04 ENCOUNTER — Inpatient Hospital Stay: Payer: Medicare HMO | Admitting: Adult Health

## 2019-12-05 ENCOUNTER — Encounter: Payer: Self-pay | Admitting: Adult Health

## 2019-12-05 ENCOUNTER — Inpatient Hospital Stay: Payer: Medicare HMO | Admitting: Adult Health

## 2019-12-05 NOTE — Progress Notes (Deleted)
[facility] [address] [place].  [address] [phone]       HOSPITAL FOLLOW UP NOTE  [name] Date of Birth:  12-14-1940 Medical Record Number:  [id number]   Reason for Referral:  hospital stroke follow up    SUBJECTIVE:   CHIEF COMPLAINT:  No chief complaint on file.   HPI:   [name] is a 79 y.o. female with history of HTN, HLD, prior history of stroke in 1994 with no residual deficits who presented on 10/08/2019 with L sided weakness.  Stroke work-up revealed a large right corona radiata infarct likely embolic secondary to unknown source.  MRA showed proximal right ICA 50% stenosis and 4 mm ACA aneurysm.  Recommended 30-day cardiac event monitor to assess for possible atrial fibrillation and stroke etiology.  Recommended DAPT for 3 weeks then Plavix alone.  HTN stable.  LDL 94 and initiated atorvastatin 40 mg daily.  Other stroke risk factors include prior stroke history, advanced age, tobacco use, PAD s/p angioplasty and stent 2007.  Other active problems include cognitive impairment at baseline, dehydration, hypokalemia, fall, GERD and depression.  Stroke: Large R corona radiata infarct likely embolic secondary to unknown source  CT head No acute abnormality.   MRI large R caudate infarct. Multiple old small vessel disease infarcts.  MRA head & neck no ELVO, proximal R ICA 50% stenosis. 23mm ACA aneurysm  Carotid Doppler  pending (recheck R ICA)  2D Echo pending   LDL 94  HgbA1c 5.4  VTE prophylaxis - Heparin 5000 units sq tid   aspirin 81 mg daily prior to admission, now on aspirin 300 mg suppository daily. Plan DAPT x 3 weeks once able to swallow.    Therapy recommendations:  SNF  Disposition:   [facility]    ROS:   14 system review of systems performed and negative with exception of ***  PMH:  Past Medical History:  Diagnosis Date  . Arthritis   . Depression   . High cholesterol   .  Hypertension   . Pneumonia   . Stroke (HCC)    1994 no residuals    PSH:  Past Surgical History:  Procedure Laterality Date  . ABDOMINAL HYSTERECTOMY    . catheterization of the left superficial femoral artery  10/20/2009  . SHOULDER ARTHROSCOPY WITH ROTATOR CUFF REPAIR AND SUBACROMIAL DECOMPRESSION Left 01/27/2018   Procedure: LEFT SHOULDER ARTHROSCOPY WITH DEBRIDEMENT, SUBACROMIAL DECOMPRESSION AND ROTATOR CUFF REPAIR;  Surgeon: Kathryne Hitch, MD;  Location: [facility];  Service: Orthopedics;  Laterality: Left;  . SHOULDER SURGERY     Left with dr. Gayla Doss arthroscopy with possible rotator cuff repair  . TONSILLECTOMY      Social History:  Social History   Socioeconomic History  . Marital status: Widowed    Spouse name: Not on file  . Number of children: Not on file  . Years of education: Not on file  . Highest education level: Not on file  Occupational History  . Not on file  Tobacco Use  . Smoking status: Former Smoker    Packs/day: 0.50    Types: Cigarettes    Quit date: 12/17/2017    Years since quitting: 1.9  . Smokeless tobacco: Never Used  . Tobacco comment: using nic patches  Vaping Use  . Vaping Use: Never used  Substance and Sexual Activity  . Alcohol use: Not Currently  . Drug use: No  . Sexual activity: Not Currently  Other Topics Concern  . Not on file  Social History Narrative  . Not on file   Social Determinants of Health   Financial Resource Strain:   . Difficulty of Paying Living Expenses: Not on file  Food Insecurity:   . Worried About Programme researcher, broadcasting/film/video in the Last Year: Not on file  . Ran Out of Food in the Last Year: Not on file  Transportation Needs:   . Lack of Transportation (Medical): Not on file  . Lack of Transportation (Non-Medical): Not on file  Physical Activity:   . Days of Exercise per Week: Not on file  . Minutes of Exercise per Session: Not on file  Stress:   . Feeling of Stress : Not on file  Social  Connections:   . Frequency of Communication with Friends and Family: Not on file  . Frequency of Social Gatherings with Friends and Family: Not on file  . Attends Religious Services: Not on file  . Active Member of Clubs or Organizations: Not on file  . Attends Banker Meetings: Not on file  . Marital Status: Not on file  Intimate Partner Violence:   . Fear of Current or Ex-Partner: Not on file  . Emotionally Abused: Not on file  . Physically Abused: Not on file  . Sexually Abused: Not on file    Family History:  Family History  Problem Relation Age of Onset  . Cancer Mother   . Diabetes Mother   . Heart disease Mother   . Hypertension Mother   . Heart attack Mother   . Hyperlipidemia Mother   . Hypertension Father   . Diabetes Sister   . Heart disease Sister   . Hyperlipidemia Sister   . Hypertension Sister   . Cancer Sister   . Diabetes Brother   . Heart disease Brother   . Hypertension Brother   . Cancer Brother   . Hyperlipidemia Brother   . Heart disease Son   . Heart attack Son        amputation    Medications:   Current Outpatient Medications on File Prior to Visit  Medication Sig Dispense Refill  . albuterol (VENTOLIN HFA) 108 (90 Base) MCG/ACT inhaler Inhale 2 puffs into the lungs every 6 (six) hours as needed for wheezing or shortness of breath.     Marland Kitchen amLODipine (NORVASC) 10 MG tablet Take 1 tablet (10 mg total) by mouth daily. 30 tablet 0  . atorvastatin (LIPITOR) 10 MG tablet Take 1 tablet (10 mg total) by mouth daily. 30 tablet 0  . buPROPion (WELLBUTRIN XL) 150 MG 24 hr tablet Take 1 tablet (150 mg total) by mouth every morning. 30 tablet 0  . Calcium Carbonate-Vitamin D (CALCIUM 600+D HIGH POTENCY PO) Take 1 tablet by mouth daily.     . Cholecalciferol (VITAMIN D) 2000 UNITS tablet Take 2,000 Units by mouth daily.    Marland Kitchen escitalopram (LEXAPRO) 10 MG tablet 1  qam 30 tablet 0  . ezetimibe (ZETIA) 10 MG tablet Take 1 tablet (10 mg total) by  mouth daily. 30 tablet 0  . lisinopril (ZESTRIL) 40 MG tablet Take 1 tablet (40 mg total) by mouth daily. 30 tablet 0  . MYRBETRIQ 25 MG TB24 tablet Take 1 tablet (25 mg total) by mouth daily. 30 tablet 0  . pantoprazole (PROTONIX) 20 MG tablet Take 1 tablet (20 mg total) by mouth daily. 30 tablet 0   No current facility-administered medications on file prior to visit.  Allergies:   Allergies  Allergen Reactions  . Penicillins Swelling and Other (See Comments)    Has patient had a PCN reaction causing immediate rash, facial/tongue/throat swelling, SOB or lightheadedness with hypotension: Yes Has patient had a PCN reaction causing severe rash involving mucus membranes or skin necrosis: No Has patient had a PCN reaction that required hospitalization: No Has patient had a PCN reaction occurring within the last 10 years: No If all of the above answers are "NO", then may proceed with Cephalosporin use.   . Shellfish Allergy Itching and Swelling  . Zoloft [Sertraline] Rash      OBJECTIVE:  Physical Exam  There were no vitals filed for this visit. There is no height or weight on file to calculate BMI. No exam data present  No flowsheet data found.   General: well developed, well nourished, seated, in no evident distress Head: head normocephalic and atraumatic.   Neck: supple with no carotid or supraclavicular bruits Cardiovascular: regular rate and rhythm, no murmurs Musculoskeletal: no deformity Skin:  no rash/petichiae Vascular:  Normal pulses all extremities   Neurologic Exam Mental Status: Awake and fully alert. Oriented to place and time. Recent and remote memory intact. Attention span, concentration and fund of knowledge appropriate. Mood and affect appropriate.  Cranial Nerves: Fundoscopic exam reveals sharp disc margins. Pupils equal, briskly reactive to light. Extraocular movements full without nystagmus. Visual fields full to confrontation. Hearing intact. Facial  sensation intact. Face, tongue, palate moves normally and symmetrically.  Motor: Normal bulk and tone. Normal strength in all tested extremity muscles. Sensory.: intact to touch , pinprick , position and vibratory sensation.  Coordination: Rapid alternating movements normal in all extremities. Finger-to-nose and heel-to-shin performed accurately bilaterally. Gait and Station: Arises from chair without difficulty. Stance is normal. Gait demonstrates normal stride length and balance Reflexes: 1+ and symmetric. Toes downgoing.     NIHSS  *** Modified Rankin  *** CHA2DS2-VASc *** HAS-BLED ***     ASSESSMENT: [name] is a 79 y.o. year old female presented with left-sided weakness on 10/08/2019 with stroke work-up revealing large R CR infarct likely embolic secondary to unknown source.  Recommended 30-day cardiac event monitor outpatient to rule out atrial fibrillation.  Vascular risk factors include HTN, HLD, stroke history, tobacco use, PAD s/p angioplasty and stent 2007, carotid stenosis and cerebral aneurysm.      PLAN:  1. *** : Residual deficit: ***. Continue {anticoagulants:31417}  and ***  for secondary stroke prevention. Close PCP follow up for aggressive stroke risk factor management  2. HTN: BP goal <130/90. Continue f/u with PCP 3. HLD: LDL goal <70. Recent LDL ***. F/u with PCP for management as well as prescribing of statin 4. DMII: A1c goal<7.0. Recent A1c ***. F/u with PCP    Follow up in *** or call earlier if needed   I spent *** minutes of face-to-face and non-face-to-face time with patient.  This included previsit chart review, lab review, study review, order entry, electronic health record documentation, patient education regarding recent stroke, residual deficits, importance of managing stroke risk factors and answered all questions to patient satisfaction     Ihor Austin, [facility]  [facility] [address] [place],  [place] [address]  Phone [phone] Fax [phone] Note: This document was prepared with digital dictation and possible smart phrase technology. Any transcriptional errors that result from this process are unintentional.

## 2019-12-06 ENCOUNTER — Ambulatory Visit (INDEPENDENT_AMBULATORY_CARE_PROVIDER_SITE_OTHER): Payer: Medicare HMO

## 2019-12-06 ENCOUNTER — Other Ambulatory Visit: Payer: Self-pay

## 2019-12-06 ENCOUNTER — Ambulatory Visit: Payer: Medicare HMO | Admitting: Podiatry

## 2019-12-06 ENCOUNTER — Encounter: Payer: Self-pay | Admitting: Podiatry

## 2019-12-06 DIAGNOSIS — I119 Hypertensive heart disease without heart failure: Secondary | ICD-10-CM | POA: Diagnosis not present

## 2019-12-06 DIAGNOSIS — I6523 Occlusion and stenosis of bilateral carotid arteries: Secondary | ICD-10-CM | POA: Diagnosis not present

## 2019-12-06 DIAGNOSIS — R52 Pain, unspecified: Secondary | ICD-10-CM | POA: Diagnosis not present

## 2019-12-06 DIAGNOSIS — R1312 Dysphagia, oropharyngeal phase: Secondary | ICD-10-CM | POA: Diagnosis not present

## 2019-12-06 DIAGNOSIS — M722 Plantar fascial fibromatosis: Secondary | ICD-10-CM

## 2019-12-06 DIAGNOSIS — L84 Corns and callosities: Secondary | ICD-10-CM | POA: Diagnosis not present

## 2019-12-06 DIAGNOSIS — I69392 Facial weakness following cerebral infarction: Secondary | ICD-10-CM | POA: Diagnosis not present

## 2019-12-06 DIAGNOSIS — E785 Hyperlipidemia, unspecified: Secondary | ICD-10-CM | POA: Diagnosis not present

## 2019-12-06 DIAGNOSIS — I739 Peripheral vascular disease, unspecified: Secondary | ICD-10-CM | POA: Diagnosis not present

## 2019-12-06 DIAGNOSIS — I69354 Hemiplegia and hemiparesis following cerebral infarction affecting left non-dominant side: Secondary | ICD-10-CM | POA: Diagnosis not present

## 2019-12-06 DIAGNOSIS — I69391 Dysphagia following cerebral infarction: Secondary | ICD-10-CM | POA: Diagnosis not present

## 2019-12-06 DIAGNOSIS — I720 Aneurysm of carotid artery: Secondary | ICD-10-CM | POA: Diagnosis not present

## 2019-12-06 NOTE — Progress Notes (Signed)
Subjective:   Patient ID: [name], female   DOB: 79 y.o.   MRN: [id number]   HPI Patient presents stating that she has foot pain bilateral and she admits that she is smoking again and has not been seen in a number of years.  States the bottom of the right heel is very sore in the left forefoot.  Patient does smoke half pack per day and is not active   Review of Systems  All other systems reviewed and are negative.       Objective:  Physical Exam Vitals and nursing note reviewed.  Constitutional:      Appearance: She is well-developed.  Pulmonary:     Effort: Pulmonary effort is normal.  Musculoskeletal:        General: Normal range of motion.  Skin:    General: Skin is warm.  Neurological:     Mental Status: She is alert.     Vascular status significantly diminished bilateral DP PT pulses with patient noted to have exquisite discomfort plantar aspect right heel with inflammation and keratotic lesion in the heel right and the forefoot left with diminished cap fill time and patient not significantly oriented well and difficult to communicate with     Assessment:  Acute plantar fasciitis with vascular disease contributory and lesion formation bilateral painful     Plan:  H&P reviewed all conditions did sterile prep and injected the fascia right 3 mg dexamethasone Kenalog 5 mg Xylocaine and debrided lesions bilateral discussed vascular studies and she wants to hold off and see if this improves her and if not we will order vascular studies but she is refusing them at the current time  X-rays indicate that there is spur formation no indication stress fracture or advanced arthritis

## 2019-12-11 DIAGNOSIS — I69354 Hemiplegia and hemiparesis following cerebral infarction affecting left non-dominant side: Secondary | ICD-10-CM | POA: Diagnosis not present

## 2019-12-11 DIAGNOSIS — R1312 Dysphagia, oropharyngeal phase: Secondary | ICD-10-CM | POA: Diagnosis not present

## 2019-12-11 DIAGNOSIS — I739 Peripheral vascular disease, unspecified: Secondary | ICD-10-CM | POA: Diagnosis not present

## 2019-12-11 DIAGNOSIS — I69392 Facial weakness following cerebral infarction: Secondary | ICD-10-CM | POA: Diagnosis not present

## 2019-12-11 DIAGNOSIS — I720 Aneurysm of carotid artery: Secondary | ICD-10-CM | POA: Diagnosis not present

## 2019-12-11 DIAGNOSIS — E785 Hyperlipidemia, unspecified: Secondary | ICD-10-CM | POA: Diagnosis not present

## 2019-12-11 DIAGNOSIS — I119 Hypertensive heart disease without heart failure: Secondary | ICD-10-CM | POA: Diagnosis not present

## 2019-12-11 DIAGNOSIS — I69391 Dysphagia following cerebral infarction: Secondary | ICD-10-CM | POA: Diagnosis not present

## 2019-12-11 DIAGNOSIS — I6523 Occlusion and stenosis of bilateral carotid arteries: Secondary | ICD-10-CM | POA: Diagnosis not present

## 2019-12-20 ENCOUNTER — Telehealth: Payer: Self-pay | Admitting: *Deleted

## 2019-12-20 ENCOUNTER — Inpatient Hospital Stay: Payer: Medicare HMO | Admitting: Adult Health

## 2019-12-20 NOTE — Progress Notes (Deleted)
[facility] [address] [place]. [place] [address] [phone]       HOSPITAL FOLLOW UP NOTE  [name] Date of Birth:  11-Aug-1940 Medical Record Number:  [id number]   Reason for Referral:  hospital stroke follow up    SUBJECTIVE:   CHIEF COMPLAINT:  No chief complaint on file.   HPI:   [name] is a 79 y.o. female with history of HTN, HLD, prior history of stroke in 1994 with no residual deficits who presented on 10/08/2019 with L sided weakness.  Stroke work-up revealed a large right corona radiata infarct likely embolic secondary to unknown source.  MRA showed proximal right ICA 50% stenosis and 4 mm ACA aneurysm.  Recommended 30-day cardiac event monitor to assess for possible atrial fibrillation and stroke etiology.  Recommended DAPT for 3 weeks then Plavix alone.  HTN stable.  LDL 94 and initiated atorvastatin 40 mg daily.  Other stroke risk factors include prior stroke history, advanced age, tobacco use, PAD s/p angioplasty and stent 2007.  Other active problems include cognitive impairment at baseline, dehydration, hypokalemia, fall, GERD and depression.  Stroke: Large R corona radiata infarct likely embolic secondary to unknown source  CT head No acute abnormality.   MRI large R caudate infarct. Multiple old small vessel disease infarcts.  MRA head & neck no ELVO, proximal R ICA 50% stenosis. 77mm ACA aneurysm  Carotid Doppler  pending (recheck R ICA)  2D Echo pending   LDL 94  HgbA1c 5.4  VTE prophylaxis - Heparin 5000 units sq tid   aspirin 81 mg daily prior to admission, now on aspirin 300 mg suppository daily. Plan DAPT x 3 weeks once able to swallow.    Therapy recommendations:  SNF  Disposition:   [facility]    ROS:   14 system review of systems performed and negative with exception of ***  PMH:  Past Medical History:  Diagnosis Date  . Arthritis   . Depression   . High cholesterol   .  Hypertension   . Pneumonia   . Stroke (HCC)    1994 no residuals    PSH:  Past Surgical History:  Procedure Laterality Date  . ABDOMINAL HYSTERECTOMY    . catheterization of the left superficial femoral artery  10/20/2009  . SHOULDER ARTHROSCOPY WITH ROTATOR CUFF REPAIR AND SUBACROMIAL DECOMPRESSION Left 01/27/2018   Procedure: LEFT SHOULDER ARTHROSCOPY WITH DEBRIDEMENT, SUBACROMIAL DECOMPRESSION AND ROTATOR CUFF REPAIR;  Surgeon: Kathryne Hitch, MD;  Location: [facility];  Service: Orthopedics;  Laterality: Left;  . SHOULDER SURGERY     Left with dr. Gayla Doss arthroscopy with possible rotator cuff repair  . TONSILLECTOMY      Social History:  Social History   Socioeconomic History  . Marital status: Widowed    Spouse name: Not on file  . Number of children: Not on file  . Years of education: Not on file  . Highest education level: Not on file  Occupational History  . Not on file  Tobacco Use  . Smoking status: Former Smoker    Packs/day: 0.50    Types: Cigarettes    Quit date: 12/17/2017    Years since quitting: 2.0  . Smokeless tobacco: Never Used  . Tobacco comment: using nic patches  Vaping Use  . Vaping Use: Never used  Substance and Sexual Activity  . Alcohol use: Not Currently  . Drug use: No  . Sexual activity: Not Currently  Other Topics Concern  . Not on file  Social History Narrative  . Not on file   Social Determinants of Health   Financial Resource Strain:   . Difficulty of Paying Living Expenses: Not on file  Food Insecurity:   . Worried About Programme researcher, broadcasting/film/video in the Last Year: Not on file  . Ran Out of Food in the Last Year: Not on file  Transportation Needs:   . Lack of Transportation (Medical): Not on file  . Lack of Transportation (Non-Medical): Not on file  Physical Activity:   . Days of Exercise per Week: Not on file  . Minutes of Exercise per Session: Not on file  Stress:   . Feeling of Stress : Not on file  Social  Connections:   . Frequency of Communication with Friends and Family: Not on file  . Frequency of Social Gatherings with Friends and Family: Not on file  . Attends Religious Services: Not on file  . Active Member of Clubs or Organizations: Not on file  . Attends Banker Meetings: Not on file  . Marital Status: Not on file  Intimate Partner Violence:   . Fear of Current or Ex-Partner: Not on file  . Emotionally Abused: Not on file  . Physically Abused: Not on file  . Sexually Abused: Not on file    Family History:  Family History  Problem Relation Age of Onset  . Cancer Mother   . Diabetes Mother   . Heart disease Mother   . Hypertension Mother   . Heart attack Mother   . Hyperlipidemia Mother   . Hypertension Father   . Diabetes Sister   . Heart disease Sister   . Hyperlipidemia Sister   . Hypertension Sister   . Cancer Sister   . Diabetes Brother   . Heart disease Brother   . Hypertension Brother   . Cancer Brother   . Hyperlipidemia Brother   . Heart disease Son   . Heart attack Son        amputation    Medications:   Current Outpatient Medications on File Prior to Visit  Medication Sig Dispense Refill  . albuterol (VENTOLIN HFA) 108 (90 Base) MCG/ACT inhaler Inhale 2 puffs into the lungs every 6 (six) hours as needed for wheezing or shortness of breath.     Marland Kitchen amLODipine (NORVASC) 10 MG tablet Take 1 tablet (10 mg total) by mouth daily. 30 tablet 0  . atorvastatin (LIPITOR) 10 MG tablet Take 1 tablet (10 mg total) by mouth daily. 30 tablet 0  . buPROPion (WELLBUTRIN XL) 150 MG 24 hr tablet Take 1 tablet (150 mg total) by mouth every morning. 30 tablet 0  . Calcium Carbonate-Vitamin D (CALCIUM 600+D HIGH POTENCY PO) Take 1 tablet by mouth daily.     . Cholecalciferol (VITAMIN D) 2000 UNITS tablet Take 2,000 Units by mouth daily.    Marland Kitchen escitalopram (LEXAPRO) 10 MG tablet 1  qam 30 tablet 0  . ezetimibe (ZETIA) 10 MG tablet Take 1 tablet (10 mg total) by  mouth daily. 30 tablet 0  . lisinopril (ZESTRIL) 40 MG tablet Take 1 tablet (40 mg total) by mouth daily. 30 tablet 0  . MYRBETRIQ 25 MG TB24 tablet Take 1 tablet (25 mg total) by mouth daily. 30 tablet 0  . pantoprazole (PROTONIX) 20 MG tablet Take 1 tablet (20 mg total) by mouth daily. 30 tablet 0   No current facility-administered medications on file prior to visit.  Allergies:   Allergies  Allergen Reactions  . Penicillins Swelling and Other (See Comments)    Has patient had a PCN reaction causing immediate rash, facial/tongue/throat swelling, SOB or lightheadedness with hypotension: Yes Has patient had a PCN reaction causing severe rash involving mucus membranes or skin necrosis: No Has patient had a PCN reaction that required hospitalization: No Has patient had a PCN reaction occurring within the last 10 years: No If all of the above answers are "NO", then may proceed with Cephalosporin use.   . Shellfish Allergy Itching and Swelling  . Zoloft [Sertraline] Rash      OBJECTIVE:  Physical Exam  There were no vitals filed for this visit. There is no height or weight on file to calculate BMI. No exam data present  No flowsheet data found.   General: well developed, well nourished, seated, in no evident distress Head: head normocephalic and atraumatic.   Neck: supple with no carotid or supraclavicular bruits Cardiovascular: regular rate and rhythm, no murmurs Musculoskeletal: no deformity Skin:  no rash/petichiae Vascular:  Normal pulses all extremities   Neurologic Exam Mental Status: Awake and fully alert. Oriented to place and time. Recent and remote memory intact. Attention span, concentration and fund of knowledge appropriate. Mood and affect appropriate.  Cranial Nerves: Fundoscopic exam reveals sharp disc margins. Pupils equal, briskly reactive to light. Extraocular movements full without nystagmus. Visual fields full to confrontation. Hearing intact. Facial  sensation intact. Face, tongue, palate moves normally and symmetrically.  Motor: Normal bulk and tone. Normal strength in all tested extremity muscles. Sensory.: intact to touch , pinprick , position and vibratory sensation.  Coordination: Rapid alternating movements normal in all extremities. Finger-to-nose and heel-to-shin performed accurately bilaterally. Gait and Station: Arises from chair without difficulty. Stance is normal. Gait demonstrates normal stride length and balance Reflexes: 1+ and symmetric. Toes downgoing.     NIHSS  *** Modified Rankin  *** CHA2DS2-VASc *** HAS-BLED ***     ASSESSMENT: [name] is a 79 y.o. year old female presented with left-sided weakness on 10/08/2019 with stroke work-up revealing large R CR infarct likely embolic secondary to unknown source.  Recommended 30-day cardiac event monitor outpatient to rule out atrial fibrillation.  Vascular risk factors include HTN, HLD, stroke history, tobacco use, PAD s/p angioplasty and stent 2007, carotid stenosis and cerebral aneurysm.      PLAN:  1. *** : Residual deficit: ***. Continue {anticoagulants:31417}  and ***  for secondary stroke prevention. Close PCP follow up for aggressive stroke risk factor management  2. HTN: BP goal <130/90. Continue f/u with PCP 3. HLD: LDL goal <70. Recent LDL ***. F/u with PCP for management as well as prescribing of statin 4. DMII: A1c goal<7.0. Recent A1c ***. F/u with PCP    Follow up in *** or call earlier if needed   I spent *** minutes of face-to-face and non-face-to-face time with patient.  This included previsit chart review, lab review, study review, order entry, electronic health record documentation, patient education regarding recent stroke, residual deficits, importance of managing stroke risk factors and answered all questions to patient satisfaction     Ihor Austin, [facility]  [facility] [address] [place],  [place] [address]  Phone [phone] Fax [phone] Note: This document was prepared with digital dictation and possible smart phrase technology. Any transcriptional errors that result from this process are unintentional.

## 2019-12-20 NOTE — Telephone Encounter (Signed)
I called pts sister and relayed that she had fall in parking lot.  She did not hurt herself.  She was able to use walker and walk into office. VSS. Alert, oriented. She did not want to call anyone.  But I wanted to let someone know.  She will check on her later.  She lives on [address]. 139/72, 84 HR. Was able to get back in car.  She appreciated call.  I relayed tried to call daughter NIS #, and son and LM.

## 2019-12-25 ENCOUNTER — Ambulatory Visit: Payer: Medicare HMO | Admitting: Adult Health

## 2019-12-25 ENCOUNTER — Other Ambulatory Visit: Payer: Self-pay

## 2019-12-25 ENCOUNTER — Encounter: Payer: Self-pay | Admitting: Adult Health

## 2019-12-25 VITALS — BP 147/79 | HR 68 | Ht 64.0 in | Wt 168.0 lb

## 2019-12-25 DIAGNOSIS — R269 Unspecified abnormalities of gait and mobility: Secondary | ICD-10-CM

## 2019-12-25 DIAGNOSIS — E785 Hyperlipidemia, unspecified: Secondary | ICD-10-CM

## 2019-12-25 DIAGNOSIS — I671 Cerebral aneurysm, nonruptured: Secondary | ICD-10-CM | POA: Diagnosis not present

## 2019-12-25 DIAGNOSIS — I639 Cerebral infarction, unspecified: Secondary | ICD-10-CM | POA: Diagnosis not present

## 2019-12-25 DIAGNOSIS — I6523 Occlusion and stenosis of bilateral carotid arteries: Secondary | ICD-10-CM

## 2019-12-25 DIAGNOSIS — R5381 Other malaise: Secondary | ICD-10-CM | POA: Diagnosis not present

## 2019-12-25 DIAGNOSIS — Z720 Tobacco use: Secondary | ICD-10-CM

## 2019-12-25 DIAGNOSIS — I10 Essential (primary) hypertension: Secondary | ICD-10-CM

## 2019-12-25 DIAGNOSIS — I69398 Other sequelae of cerebral infarction: Secondary | ICD-10-CM

## 2019-12-25 MED ORDER — CLOPIDOGREL BISULFATE 75 MG PO TABS: 75.0000 mg | ORAL_TABLET | Freq: Every day | ORAL | 3 refills | Status: DC

## 2019-12-25 NOTE — Progress Notes (Signed)
[facility] [address] [place]. [place] [address] [phone]       HOSPITAL FOLLOW UP NOTE  [name] Date of Birth:  Feb 19, 1941 Medical Record Number:  [id number]   Reason for Referral:  hospital stroke follow up    SUBJECTIVE:   CHIEF COMPLAINT:  Chief Complaint  Patient presents with  . Hospitalization Follow-up    stroke fu, rm 9, pt reports fall on 12/20/19, states left side is weak    HPI:   [name] is a 79 y.o. female with history of HTN, HLD, prior history of stroke in 1994 with no residual deficits who presented on 10/08/2019 with L sided weakness.  Stroke work-up revealed a large right corona radiata infarct likely embolic secondary to unknown source.  MRA showed proximal right ICA 50% stenosis and 4 mm ACA aneurysm.  Carotid Doppler showed bilateral ICA 40 to 59% stenosis.  Recommended 30-day cardiac event monitor to assess for possible atrial fibrillation and stroke etiology.  Recommended DAPT for 3 weeks then Plavix alone.  HTN stable.  LDL 94 and initiated atorvastatin 40 mg daily.  Other stroke risk factors include prior stroke history, advanced age, tobacco use, PAD s/p angioplasty and stent 2007.  Other active problems include cognitive impairment at baseline, dehydration, hypokalemia, fall, GERD and depression.  Residual deficits of dysphagia, left-sided weakness, gait impairment, and imbalance.  Evaluated by therapies who recommended discharge to SNF for ongoing therapy needs.  Stroke: Large R corona radiata infarct likely embolic secondary to unknown source  CT head No acute abnormality.   MRI large R caudate infarct. Multiple old small vessel disease infarcts.  MRA head & neck no ELVO, proximal R ICA 50% stenosis. 52mm ACA aneurysm  Carotid Doppler  B ICA 40 to 59% stenosis, VAs antegrade  2D Echo  EF 60 to 70%, normal LV function and size, possible intraarterial septal aneurysm  LDL 94  HgbA1c 5.4  VTE prophylaxis -  Heparin 5000 units sq tid   aspirin 81 mg daily prior to admission, now on aspirin 300 mg suppository daily. Plan DAPT x 3 weeks once able to swallow.    Therapy recommendations:  SNF  Disposition:   [facility]  Today, 12/25/2019, [name] is being seen for hospital follow-up unaccompanied. She has since returned home from SNF and is living independently.  She reports continued gait impairment and imbalance with frequent falls due to loss of balance which is usually worse when going from sitting to standing.  She was working with [facility] PT but has not had visit in over the past couple of weeks.  She is able to manage ADLs independently but does admit to having difficulty with IADLs such as medication administration, bill paying, heavy cleaning and complex cooking.  She has been driving short distances.  Denies residual swallowing difficulties but has been eating softer foods as this is " easier".  Denies new or worsening stroke/TIA symptoms.  She reports currently being on aspirin 81 mg daily despite recommended Plavix after completion of 3-week DAPT but denies bleeding or bruising.  Remains on atorvastatin 10 mg daily without myalgias.  Blood pressure today 147/79 with patient reporting continuation of metoprolol, lisinopril, hydrochlorothiazide, hydralazine and amlodipine per PCP.  No further concerns at this time.    ROS:   14 system review of systems performed and negative with exception of those listed in the HPI  PMH:  Past Medical History:  Diagnosis Date  .  Arthritis   . Depression   . High cholesterol   . Hypertension   . Pneumonia   . Stroke (HCC)    1994 no residuals    PSH:  Past Surgical History:  Procedure Laterality Date  . ABDOMINAL HYSTERECTOMY    . catheterization of the left superficial femoral artery  10/20/2009  . SHOULDER ARTHROSCOPY WITH ROTATOR CUFF REPAIR AND SUBACROMIAL DECOMPRESSION Left 01/27/2018   Procedure: LEFT SHOULDER ARTHROSCOPY WITH  DEBRIDEMENT, SUBACROMIAL DECOMPRESSION AND ROTATOR CUFF REPAIR;  Surgeon: Kathryne Hitch, MD;  Location: [facility];  Service: Orthopedics;  Laterality: Left;  . SHOULDER SURGERY     Left with dr. Gayla Doss arthroscopy with possible rotator cuff repair  . TONSILLECTOMY      Social History:  Social History   Socioeconomic History  . Marital status: Widowed    Spouse name: Not on file  . Number of children: Not on file  . Years of education: Not on file  . Highest education level: Not on file  Occupational History  . Not on file  Tobacco Use  . Smoking status: Former Smoker    Packs/day: 0.50    Types: Cigarettes    Quit date: 12/17/2017    Years since quitting: 2.0  . Smokeless tobacco: Never Used  . Tobacco comment: using nic patches  Vaping Use  . Vaping Use: Never used  Substance and Sexual Activity  . Alcohol use: Not Currently  . Drug use: No  . Sexual activity: Not Currently  Other Topics Concern  . Not on file  Social History Narrative  . Not on file   Social Determinants of Health   Financial Resource Strain:   . Difficulty of Paying Living Expenses: Not on file  Food Insecurity:   . Worried About Programme researcher, broadcasting/film/video in the Last Year: Not on file  . Ran Out of Food in the Last Year: Not on file  Transportation Needs:   . Lack of Transportation (Medical): Not on file  . Lack of Transportation (Non-Medical): Not on file  Physical Activity:   . Days of Exercise per Week: Not on file  . Minutes of Exercise per Session: Not on file  Stress:   . Feeling of Stress : Not on file  Social Connections:   . Frequency of Communication with Friends and Family: Not on file  . Frequency of Social Gatherings with Friends and Family: Not on file  . Attends Religious Services: Not on file  . Active Member of Clubs or Organizations: Not on file  . Attends Banker Meetings: Not on file  . Marital Status: Not on file  Intimate Partner Violence:   . Fear of  Current or Ex-Partner: Not on file  . Emotionally Abused: Not on file  . Physically Abused: Not on file  . Sexually Abused: Not on file    Family History:  Family History  Problem Relation Age of Onset  . Cancer Mother   . Diabetes Mother   . Heart disease Mother   . Hypertension Mother   . Heart attack Mother   . Hyperlipidemia Mother   . Hypertension Father   . Diabetes Sister   . Heart disease Sister   . Hyperlipidemia Sister   . Hypertension Sister   . Cancer Sister   . Diabetes Brother   . Heart disease Brother   . Hypertension Brother   . Cancer Brother   . Hyperlipidemia Brother   . Heart disease Son   .  Heart attack Son        amputation    Medications:   Current Outpatient Medications on File Prior to Visit  Medication Sig Dispense Refill  . albuterol (VENTOLIN HFA) 108 (90 Base) MCG/ACT inhaler Inhale 2 puffs into the lungs every 6 (six) hours as needed for wheezing or shortness of breath.     . Calcium Carbonate-Vitamin D (CALCIUM 600+D HIGH POTENCY PO) Take 1 tablet by mouth daily.     . Cholecalciferol (VITAMIN D) 2000 UNITS tablet Take 2,000 Units by mouth daily.    Marland Kitchen. escitalopram (LEXAPRO) 10 MG tablet 1  qam 30 tablet 0  . amLODipine (NORVASC) 10 MG tablet Take 1 tablet (10 mg total) by mouth daily. 30 tablet 0  . atorvastatin (LIPITOR) 10 MG tablet Take 1 tablet (10 mg total) by mouth daily. 30 tablet 0  . buPROPion (WELLBUTRIN XL) 150 MG 24 hr tablet Take 1 tablet (150 mg total) by mouth every morning. 30 tablet 0  . ezetimibe (ZETIA) 10 MG tablet Take 1 tablet (10 mg total) by mouth daily. 30 tablet 0  . hydrALAZINE (APRESOLINE) 25 MG tablet Take 25 mg by mouth daily.    . hydrochlorothiazide (HYDRODIURIL) 25 MG tablet Take 25 mg by mouth daily.    Marland Kitchen. lisinopril (ZESTRIL) 40 MG tablet Take 1 tablet (40 mg total) by mouth daily. 30 tablet 0  . metoprolol tartrate (LOPRESSOR) 100 MG tablet Take 100 mg by mouth daily.    Marland Kitchen. MYRBETRIQ 25 MG TB24 tablet Take  1 tablet (25 mg total) by mouth daily. 30 tablet 0  . pantoprazole (PROTONIX) 20 MG tablet Take 1 tablet (20 mg total) by mouth daily. 30 tablet 0   No current facility-administered medications on file prior to visit.    Allergies:   Allergies  Allergen Reactions  . Penicillins Swelling and Other (See Comments)    Has patient had a PCN reaction causing immediate rash, facial/tongue/throat swelling, SOB or lightheadedness with hypotension: Yes Has patient had a PCN reaction causing severe rash involving mucus membranes or skin necrosis: No Has patient had a PCN reaction that required hospitalization: No Has patient had a PCN reaction occurring within the last 10 years: No If all of the above answers are "NO", then may proceed with Cephalosporin use.   . Shellfish Allergy Itching and Swelling  . Zoloft [Sertraline] Rash      OBJECTIVE:  Physical Exam  Vitals:   12/25/19 1348  BP: (!) 147/79  Pulse: 68  Weight: 168 lb (76.2 kg)  Height: 5\' 4"  (1.626 m)   Body mass index is 28.84 kg/m. No exam data present  No flowsheet data found.   General: well developed, well nourished,  flat affect elderly African-American female, seated, in no evident distress Head: head normocephalic and atraumatic.   Neck: supple with no carotid or supraclavicular bruits Cardiovascular: regular rate and rhythm, no murmurs Musculoskeletal: no deformity Skin:  no rash/petichiae Vascular:  Normal pulses all extremities   Neurologic Exam Mental Status: Awake and fully alert. Mild dysarthria and inconsistent speech hesitancy/delay.  Intermittently delayed responses.  Oriented to place and time. Recent and remote memory subjectively intact. Attention span, concentration and fund of knowledge appears appropriate. Mood and affect appropriate.  Cranial Nerves: Fundoscopic exam reveals sharp disc margins. Pupils equal, briskly reactive to light. Extraocular movements full without nystagmus. Visual fields  full to confrontation. Hearing intact. Facial sensation intact. Left lower facial weakness. tongue, palate moves normally and symmetrically.  Motor:  Normal bulk and tone.  Normal strength right upper and lower extremity.  Questionable left upper and lower extremity weakness but difficulty fully assessing due to lack of participation and giveaway weakness Sensory.: intact to touch , pinprick , position and vibratory sensation.  Coordination: Rapid alternating movements normal in all extremities. Finger-to-nose and heel-to-shin performed accurately bilaterally. Gait and Station: Arises from chair without difficulty. Stance is normal. Gait demonstrates slow shortened stride length with mild unsteadiness and use of rolling walker.  Tandem walk not attempted. Reflexes: 1+ and symmetric. Toes downgoing.     NIHSS  2 Modified Rankin  2-3     ASSESSMENT/PLAN: [name] is a 79 y.o. year old female presented with left-sided weakness on 10/08/2019 with stroke work-up revealing large R CR infarct likely embolic secondary to unknown source.  Recommended 30-day cardiac event monitor outpatient to rule out atrial fibrillation.  Vascular risk factors include HTN, HLD, stroke history, tobacco use, PAD s/p angioplasty and stent 2007, carotid stenosis and cerebral aneurysm.     1. R corona radiata infarct, cryptogenic:  a. Residual deficit: Gait impairment, imbalance, mild dysarthria, questionable cognitive impairment and questionable dysphagia.  Referral placed for home health services including nurse, aide, SW, PT, OT and SLP.  Concern regarding safety and appropriateness of her living independently.  She does require assistance at home and denies getting assistance from family or friends.  Discussed importance of use of rolling walker at all times and to raise from seated position slowly with help of rolling walker.   b. Referral to cardiology to complete 30-day cardiac event monitor to assess for atrial  fibrillation c. Restart clopidogrel 75 mg daily  and continue atorvastatin for secondary stroke prevention.  Advised to discontinue aspirin as she had recent stroke despite aspirin usage d. Close PCP follow up for aggressive stroke risk factor management  2. HTN: BP goal <130/90. On lisinopril and amlodipine per PCP 3. HLD: LDL goal <70. Recent LDL 94.  Initiated atorvastatin 40 mg daily at hospitalization but currently on 10 mg daily for unknown reason.  Advised continued follow-up with PCP for repeat lipid panel and ongoing monitoring management 4. Carotid stenosis: Obtain carotid ultrasound around 03/2020 for 23-month surveillance monitoring 5. Cerebral aneurysm: Obtain CTA head around 03/2020 for 49-month surveillance monitoring 6. Tobacco use: Discussed importance of tobacco cessation but she is not currently interested at this time.  She verbalized increased risk of continued tobacco usage    Follow up in 3 months or call earlier if needed  CC:  [facility] provider: Dr. Delena Serve, Chrissie Noa, MD    I spent 50 minutes of face-to-face and non-face-to-face time with patient.  This included previsit chart review including recent hospitalization pertinent progress notes, lab work and imaging, lab review, study review, order entry, electronic health record documentation, patient education regarding recent stroke, residual deficits, importance of managing stroke risk factors and answered all questions to patient satisfaction   Ihor Austin, [facility]  [facility] [address] [place], [place] [address]  Phone [phone] Fax [phone] Note: This document was prepared with digital dictation and possible smart phrase technology. Any transcriptional errors that result from this process are unintentional.

## 2019-12-25 NOTE — Progress Notes (Signed)
I agree with the above plan 

## 2019-12-25 NOTE — Patient Instructions (Addendum)
Order will be placed for home health nursing, aide, social worker, physical, occupational and speech therapy  Order will be placed to complete 30 day cardiac monitor which will assess for possible atrial fibrillation as a cause of your stroke  restart clopidogrel 75 mg daily  and continue atorvastatin  for secondary stroke prevention Discontinue aspirin and continue on plavix alone as you had a stroke despite being on aspirin  Continue to follow up with PCP regarding cholesterol and blood pressure management  Maintain strict control of hypertension with blood pressure goal below 130/90, diabetes with hemoglobin A1c goal below 6.5% and cholesterol with LDL cholesterol (bad cholesterol) goal below 70 mg/dL.       Followup in the future with me in 3 months or call earlier if needed       Thank you for coming to see [place] at [facility]. I hope we have been able to provide you high quality care today.  You may receive a patient satisfaction survey over the next few weeks. We would appreciate your feedback and comments so that we may continue to improve ourselves and the health of our patients.    Stroke Prevention Some medical conditions and lifestyle choices can lead to a higher risk for a stroke. You can help to prevent a stroke by making nutrition, lifestyle, and other changes. What nutrition changes can be made?   Eat healthy foods. ? Choose foods that are high in fiber. These include:  Fresh fruits.  Fresh vegetables.  Whole grains. ? Eat at least 5 or more servings of fruits and vegetables each day. Try to fill half of your plate at each meal with fruits and vegetables. ? Choose lean protein foods. These include:  Lowfat (lean) cuts of meat.  Chicken without skin.  Fish.  Tofu.  Beans.  Nuts. ? Eat low-fat dairy products. ? Avoid foods that:  Are high in salt (sodium).  Have saturated fat.  Have trans fat.  Have cholesterol.  Are  processed.  Are premade.  Follow eating guidelines as told by your doctor. These may include: ? Reducing how many calories you eat and drink each day. ? Limiting how much salt you eat or drink each day to 1,500 milligrams (mg). ? Using only healthy fats for cooking. These include:  Olive oil.  Canola oil.  Sunflower oil. ? Counting how many carbohydrates you eat and drink each day. What lifestyle changes can be made?  Try to stay at a healthy weight. Talk to your doctor about what a good weight is for you.  Get at least 30 minutes of moderate physical activity at least 5 days a week. This can include: ? Fast walking. ? Biking. ? Swimming.  Do not use any products that have nicotine or tobacco. This includes cigarettes and e-cigarettes. If you need help quitting, ask your doctor. Avoid being around tobacco smoke in general.  Limit how much alcohol you drink to no more than 1 drink a day for nonpregnant women and 2 drinks a day for men. One drink equals 12 oz of beer, 5 oz of wine, or 1 oz of hard liquor.  Do not use drugs.  Avoid taking birth control pills. Talk to your doctor about the risks of taking birth control pills if: ? You are over 44 years old. ? You smoke. ? You get migraines. ? You have had a blood clot. What other changes can be made?  Manage your cholesterol. ? It is important to  eat a healthy diet. ? If your cholesterol cannot be managed through your diet, you may also need to take medicines. Take medicines as told by your doctor.  Manage your diabetes. ? It is important to eat a healthy diet and to exercise regularly. ? If your blood sugar cannot be managed through diet and exercise, you may need to take medicines. Take medicines as told by your doctor.  Control your high blood pressure (hypertension). ? Try to keep your blood pressure below 130/80. This can help lower your risk of stroke. ? It is important to eat a healthy diet and to exercise  regularly. ? If your blood pressure cannot be managed through diet and exercise, you may need to take medicines. Take medicines as told by your doctor. ? Ask your doctor if you should check your blood pressure at home. ? Have your blood pressure checked every year. Do this even if your blood pressure is normal.  Talk to your doctor about getting checked for a sleep disorder. Signs of this can include: ? Snoring a lot. ? Feeling very tired.  Take over-the-counter and prescription medicines only as told by your doctor. These may include aspirin or blood thinners (antiplatelets or anticoagulants).  Make sure that any other medical conditions you have are managed. Where to find more information  American Stroke Association: www.strokeassociation.org  National Stroke Association: www.stroke.org Get help right away if:  You have any symptoms of stroke. "BE FAST" is an easy way to remember the main warning signs: ? B - Balance. Signs are dizziness, sudden trouble walking, or loss of balance. ? E - Eyes. Signs are trouble seeing or a sudden change in how you see. ? F - Face. Signs are sudden weakness or loss of feeling of the face, or the face or eyelid drooping on one side. ? A - Arms. Signs are weakness or loss of feeling in an arm. This happens suddenly and usually on one side of the body. ? S - Speech. Signs are sudden trouble speaking, slurred speech, or trouble understanding what people say. ? T - Time. Time to call emergency services. Write down what time symptoms started.  You have other signs of stroke, such as: ? A sudden, very bad headache with no known cause. ? Feeling sick to your stomach (nausea). ? Throwing up (vomiting). ? Jerky movements you cannot control (seizure). These symptoms may represent a serious problem that is an emergency. Do not wait to see if the symptoms will go away. Get medical help right away. Call your local emergency services (911 in the [place] Do not  drive yourself to the hospital. Summary  You can prevent a stroke by eating healthy, exercising, not smoking, drinking less alcohol, and treating other health problems, such as diabetes, high blood pressure, or high cholesterol.  Do not use any products that contain nicotine or tobacco, such as cigarettes and e-cigarettes.  Get help right away if you have any signs or symptoms of a stroke. This information is not intended to replace advice given to you by your health care provider. Make sure you discuss any questions you have with your health care provider. Document Revised: 04/06/2018 Document Reviewed: 05/12/2016 Elsevier Patient Education  2020 [employer].

## 2020-01-09 ENCOUNTER — Telehealth: Payer: Self-pay | Admitting: Adult Health

## 2020-01-09 NOTE — Telephone Encounter (Signed)
Patient was accepted buy [facility] and they have been trying to reach patient several times and her Final with no response .  I called patient my self and and she did not answer and I daughter telephone number was out of service. Her sister did answer I gave her the telephone number and she said she would call. [phone].

## 2020-01-18 DIAGNOSIS — Z7902 Long term (current) use of antithrombotics/antiplatelets: Secondary | ICD-10-CM | POA: Diagnosis not present

## 2020-01-18 DIAGNOSIS — R1310 Dysphagia, unspecified: Secondary | ICD-10-CM | POA: Diagnosis not present

## 2020-01-18 DIAGNOSIS — E785 Hyperlipidemia, unspecified: Secondary | ICD-10-CM | POA: Diagnosis not present

## 2020-01-18 DIAGNOSIS — K219 Gastro-esophageal reflux disease without esophagitis: Secondary | ICD-10-CM | POA: Diagnosis not present

## 2020-01-18 DIAGNOSIS — I69354 Hemiplegia and hemiparesis following cerebral infarction affecting left non-dominant side: Secondary | ICD-10-CM | POA: Diagnosis not present

## 2020-01-18 DIAGNOSIS — I739 Peripheral vascular disease, unspecified: Secondary | ICD-10-CM | POA: Diagnosis not present

## 2020-01-18 DIAGNOSIS — I10 Essential (primary) hypertension: Secondary | ICD-10-CM | POA: Diagnosis not present

## 2020-01-18 DIAGNOSIS — F339 Major depressive disorder, recurrent, unspecified: Secondary | ICD-10-CM | POA: Diagnosis not present

## 2020-01-18 DIAGNOSIS — I69321 Dysphasia following cerebral infarction: Secondary | ICD-10-CM | POA: Diagnosis not present

## 2020-01-21 ENCOUNTER — Telehealth: Payer: Self-pay | Admitting: Adult Health

## 2020-01-21 DIAGNOSIS — R5381 Other malaise: Secondary | ICD-10-CM

## 2020-01-21 DIAGNOSIS — I69398 Other sequelae of cerebral infarction: Secondary | ICD-10-CM

## 2020-01-21 DIAGNOSIS — R269 Unspecified abnormalities of gait and mobility: Secondary | ICD-10-CM

## 2020-01-21 NOTE — Telephone Encounter (Signed)
[name] from [facility] called needing VO for twice a week for 3 weeks and once a week for 5 weeks. Also an OT eval is needed and a Script for a bed rail and shower chair. Please advise.

## 2020-01-21 NOTE — Telephone Encounter (Signed)
Order started for DME bedrail and shower chair.  Will ok OT eval and ok VO for seen twice wk for 3 wkd then once for 5 wks.

## 2020-01-23 NOTE — Telephone Encounter (Signed)
[facility] Calling back

## 2020-01-23 NOTE — Telephone Encounter (Signed)
LMVM for amanda at [facility] that returning call from Vernona Rieger about VO ok for PT as listed, OT eval and DME bed and shower chair per JM/NP.  To call back if needed.

## 2020-01-28 DIAGNOSIS — F339 Major depressive disorder, recurrent, unspecified: Secondary | ICD-10-CM | POA: Diagnosis not present

## 2020-01-28 DIAGNOSIS — K219 Gastro-esophageal reflux disease without esophagitis: Secondary | ICD-10-CM | POA: Diagnosis not present

## 2020-01-28 DIAGNOSIS — I739 Peripheral vascular disease, unspecified: Secondary | ICD-10-CM | POA: Diagnosis not present

## 2020-01-28 DIAGNOSIS — R1310 Dysphagia, unspecified: Secondary | ICD-10-CM | POA: Diagnosis not present

## 2020-01-28 DIAGNOSIS — Z7902 Long term (current) use of antithrombotics/antiplatelets: Secondary | ICD-10-CM | POA: Diagnosis not present

## 2020-01-28 DIAGNOSIS — E785 Hyperlipidemia, unspecified: Secondary | ICD-10-CM | POA: Diagnosis not present

## 2020-01-28 DIAGNOSIS — I10 Essential (primary) hypertension: Secondary | ICD-10-CM | POA: Diagnosis not present

## 2020-01-28 DIAGNOSIS — I69354 Hemiplegia and hemiparesis following cerebral infarction affecting left non-dominant side: Secondary | ICD-10-CM | POA: Diagnosis not present

## 2020-01-28 DIAGNOSIS — I69321 Dysphasia following cerebral infarction: Secondary | ICD-10-CM | POA: Diagnosis not present

## 2020-01-30 DIAGNOSIS — K219 Gastro-esophageal reflux disease without esophagitis: Secondary | ICD-10-CM | POA: Diagnosis not present

## 2020-01-30 DIAGNOSIS — I69321 Dysphasia following cerebral infarction: Secondary | ICD-10-CM | POA: Diagnosis not present

## 2020-01-30 DIAGNOSIS — I10 Essential (primary) hypertension: Secondary | ICD-10-CM | POA: Diagnosis not present

## 2020-01-30 DIAGNOSIS — I69354 Hemiplegia and hemiparesis following cerebral infarction affecting left non-dominant side: Secondary | ICD-10-CM | POA: Diagnosis not present

## 2020-01-30 DIAGNOSIS — F339 Major depressive disorder, recurrent, unspecified: Secondary | ICD-10-CM | POA: Diagnosis not present

## 2020-01-30 DIAGNOSIS — E785 Hyperlipidemia, unspecified: Secondary | ICD-10-CM | POA: Diagnosis not present

## 2020-01-30 DIAGNOSIS — R1310 Dysphagia, unspecified: Secondary | ICD-10-CM | POA: Diagnosis not present

## 2020-01-30 DIAGNOSIS — Z7902 Long term (current) use of antithrombotics/antiplatelets: Secondary | ICD-10-CM | POA: Diagnosis not present

## 2020-01-30 DIAGNOSIS — I739 Peripheral vascular disease, unspecified: Secondary | ICD-10-CM | POA: Diagnosis not present

## 2020-01-30 NOTE — Telephone Encounter (Signed)
I called LMVM for pt home that [facility] trying to reach in setting up PT appt.

## 2020-02-05 DIAGNOSIS — R1310 Dysphagia, unspecified: Secondary | ICD-10-CM | POA: Diagnosis not present

## 2020-02-05 DIAGNOSIS — I10 Essential (primary) hypertension: Secondary | ICD-10-CM | POA: Diagnosis not present

## 2020-02-05 DIAGNOSIS — I69321 Dysphasia following cerebral infarction: Secondary | ICD-10-CM | POA: Diagnosis not present

## 2020-02-05 DIAGNOSIS — I739 Peripheral vascular disease, unspecified: Secondary | ICD-10-CM | POA: Diagnosis not present

## 2020-02-05 DIAGNOSIS — Z7902 Long term (current) use of antithrombotics/antiplatelets: Secondary | ICD-10-CM | POA: Diagnosis not present

## 2020-02-05 DIAGNOSIS — E785 Hyperlipidemia, unspecified: Secondary | ICD-10-CM | POA: Diagnosis not present

## 2020-02-05 DIAGNOSIS — F339 Major depressive disorder, recurrent, unspecified: Secondary | ICD-10-CM | POA: Diagnosis not present

## 2020-02-05 DIAGNOSIS — I69354 Hemiplegia and hemiparesis following cerebral infarction affecting left non-dominant side: Secondary | ICD-10-CM | POA: Diagnosis not present

## 2020-02-05 DIAGNOSIS — K219 Gastro-esophageal reflux disease without esophagitis: Secondary | ICD-10-CM | POA: Diagnosis not present

## 2020-02-08 DIAGNOSIS — Z7902 Long term (current) use of antithrombotics/antiplatelets: Secondary | ICD-10-CM | POA: Diagnosis not present

## 2020-02-08 DIAGNOSIS — I69354 Hemiplegia and hemiparesis following cerebral infarction affecting left non-dominant side: Secondary | ICD-10-CM | POA: Diagnosis not present

## 2020-02-08 DIAGNOSIS — I739 Peripheral vascular disease, unspecified: Secondary | ICD-10-CM | POA: Diagnosis not present

## 2020-02-08 DIAGNOSIS — E785 Hyperlipidemia, unspecified: Secondary | ICD-10-CM | POA: Diagnosis not present

## 2020-02-08 DIAGNOSIS — I69321 Dysphasia following cerebral infarction: Secondary | ICD-10-CM | POA: Diagnosis not present

## 2020-02-08 DIAGNOSIS — F339 Major depressive disorder, recurrent, unspecified: Secondary | ICD-10-CM | POA: Diagnosis not present

## 2020-02-08 DIAGNOSIS — K219 Gastro-esophageal reflux disease without esophagitis: Secondary | ICD-10-CM | POA: Diagnosis not present

## 2020-02-08 DIAGNOSIS — R1310 Dysphagia, unspecified: Secondary | ICD-10-CM | POA: Diagnosis not present

## 2020-02-08 DIAGNOSIS — I10 Essential (primary) hypertension: Secondary | ICD-10-CM | POA: Diagnosis not present

## 2020-02-11 DIAGNOSIS — I10 Essential (primary) hypertension: Secondary | ICD-10-CM | POA: Diagnosis not present

## 2020-02-11 DIAGNOSIS — I69321 Dysphasia following cerebral infarction: Secondary | ICD-10-CM | POA: Diagnosis not present

## 2020-02-11 DIAGNOSIS — I739 Peripheral vascular disease, unspecified: Secondary | ICD-10-CM | POA: Diagnosis not present

## 2020-02-11 DIAGNOSIS — K219 Gastro-esophageal reflux disease without esophagitis: Secondary | ICD-10-CM | POA: Diagnosis not present

## 2020-02-11 DIAGNOSIS — F339 Major depressive disorder, recurrent, unspecified: Secondary | ICD-10-CM | POA: Diagnosis not present

## 2020-02-11 DIAGNOSIS — R1310 Dysphagia, unspecified: Secondary | ICD-10-CM | POA: Diagnosis not present

## 2020-02-11 DIAGNOSIS — I69354 Hemiplegia and hemiparesis following cerebral infarction affecting left non-dominant side: Secondary | ICD-10-CM | POA: Diagnosis not present

## 2020-02-11 DIAGNOSIS — E785 Hyperlipidemia, unspecified: Secondary | ICD-10-CM | POA: Diagnosis not present

## 2020-02-11 DIAGNOSIS — Z7902 Long term (current) use of antithrombotics/antiplatelets: Secondary | ICD-10-CM | POA: Diagnosis not present

## 2020-02-12 DIAGNOSIS — E785 Hyperlipidemia, unspecified: Secondary | ICD-10-CM | POA: Diagnosis not present

## 2020-02-12 DIAGNOSIS — F339 Major depressive disorder, recurrent, unspecified: Secondary | ICD-10-CM | POA: Diagnosis not present

## 2020-02-12 DIAGNOSIS — I69354 Hemiplegia and hemiparesis following cerebral infarction affecting left non-dominant side: Secondary | ICD-10-CM | POA: Diagnosis not present

## 2020-02-12 DIAGNOSIS — R1310 Dysphagia, unspecified: Secondary | ICD-10-CM | POA: Diagnosis not present

## 2020-02-12 DIAGNOSIS — I739 Peripheral vascular disease, unspecified: Secondary | ICD-10-CM | POA: Diagnosis not present

## 2020-02-12 DIAGNOSIS — I69321 Dysphasia following cerebral infarction: Secondary | ICD-10-CM | POA: Diagnosis not present

## 2020-02-12 DIAGNOSIS — Z7902 Long term (current) use of antithrombotics/antiplatelets: Secondary | ICD-10-CM | POA: Diagnosis not present

## 2020-02-12 DIAGNOSIS — K219 Gastro-esophageal reflux disease without esophagitis: Secondary | ICD-10-CM | POA: Diagnosis not present

## 2020-02-12 DIAGNOSIS — I10 Essential (primary) hypertension: Secondary | ICD-10-CM | POA: Diagnosis not present

## 2020-02-13 DIAGNOSIS — I739 Peripheral vascular disease, unspecified: Secondary | ICD-10-CM | POA: Diagnosis not present

## 2020-02-13 DIAGNOSIS — K219 Gastro-esophageal reflux disease without esophagitis: Secondary | ICD-10-CM | POA: Diagnosis not present

## 2020-02-13 DIAGNOSIS — I10 Essential (primary) hypertension: Secondary | ICD-10-CM | POA: Diagnosis not present

## 2020-02-13 DIAGNOSIS — F339 Major depressive disorder, recurrent, unspecified: Secondary | ICD-10-CM | POA: Diagnosis not present

## 2020-02-13 DIAGNOSIS — Z7902 Long term (current) use of antithrombotics/antiplatelets: Secondary | ICD-10-CM | POA: Diagnosis not present

## 2020-02-13 DIAGNOSIS — E785 Hyperlipidemia, unspecified: Secondary | ICD-10-CM | POA: Diagnosis not present

## 2020-02-13 DIAGNOSIS — R1310 Dysphagia, unspecified: Secondary | ICD-10-CM | POA: Diagnosis not present

## 2020-02-13 DIAGNOSIS — I69354 Hemiplegia and hemiparesis following cerebral infarction affecting left non-dominant side: Secondary | ICD-10-CM | POA: Diagnosis not present

## 2020-02-13 DIAGNOSIS — I69321 Dysphasia following cerebral infarction: Secondary | ICD-10-CM | POA: Diagnosis not present

## 2020-02-14 ENCOUNTER — Telehealth: Payer: Self-pay | Admitting: Adult Health

## 2020-02-14 NOTE — Telephone Encounter (Signed)
Clydie Braun called from [facility] wanted to talk about changing patient's apt called and left her a message [phone]

## 2020-02-25 NOTE — Telephone Encounter (Signed)
Called and spoke to [facility] and patient has started home Health.

## 2020-02-29 ENCOUNTER — Emergency Department (HOSPITAL_COMMUNITY): Payer: Medicare Other

## 2020-02-29 ENCOUNTER — Inpatient Hospital Stay (HOSPITAL_COMMUNITY)
Admission: EM | Admit: 2020-02-29 | Discharge: 2020-03-06 | DRG: 564 | Disposition: A | Discharge status: Skilled Nursing Facility | Payer: Medicare Other | Attending: Internal Medicine | Admitting: Internal Medicine

## 2020-02-29 DIAGNOSIS — R2681 Unsteadiness on feet: Secondary | ICD-10-CM | POA: Diagnosis not present

## 2020-02-29 DIAGNOSIS — Y92009 Unspecified place in unspecified non-institutional (private) residence as the place of occurrence of the external cause: Secondary | ICD-10-CM

## 2020-02-29 DIAGNOSIS — I959 Hypotension, unspecified: Secondary | ICD-10-CM | POA: Diagnosis not present

## 2020-02-29 DIAGNOSIS — J3489 Other specified disorders of nose and nasal sinuses: Secondary | ICD-10-CM | POA: Diagnosis not present

## 2020-02-29 DIAGNOSIS — Z90710 Acquired absence of both cervix and uterus: Secondary | ICD-10-CM

## 2020-02-29 DIAGNOSIS — Z79899 Other long term (current) drug therapy: Secondary | ICD-10-CM

## 2020-02-29 DIAGNOSIS — R778 Other specified abnormalities of plasma proteins: Secondary | ICD-10-CM | POA: Diagnosis not present

## 2020-02-29 DIAGNOSIS — Z8249 Family history of ischemic heart disease and other diseases of the circulatory system: Secondary | ICD-10-CM

## 2020-02-29 DIAGNOSIS — M25552 Pain in left hip: Secondary | ICD-10-CM | POA: Diagnosis not present

## 2020-02-29 DIAGNOSIS — T796XXA Traumatic ischemia of muscle, initial encounter: Secondary | ICD-10-CM | POA: Diagnosis not present

## 2020-02-29 DIAGNOSIS — I69392 Facial weakness following cerebral infarction: Secondary | ICD-10-CM | POA: Diagnosis not present

## 2020-02-29 DIAGNOSIS — I69391 Dysphagia following cerebral infarction: Secondary | ICD-10-CM | POA: Diagnosis not present

## 2020-02-29 DIAGNOSIS — M533 Sacrococcygeal disorders, not elsewhere classified: Secondary | ICD-10-CM | POA: Diagnosis not present

## 2020-02-29 DIAGNOSIS — I10 Essential (primary) hypertension: Secondary | ICD-10-CM | POA: Diagnosis not present

## 2020-02-29 DIAGNOSIS — M6282 Rhabdomyolysis: Secondary | ICD-10-CM | POA: Diagnosis not present

## 2020-02-29 DIAGNOSIS — Z87891 Personal history of nicotine dependence: Secondary | ICD-10-CM

## 2020-02-29 DIAGNOSIS — I6389 Other cerebral infarction: Secondary | ICD-10-CM | POA: Diagnosis not present

## 2020-02-29 DIAGNOSIS — Z9181 History of falling: Secondary | ICD-10-CM | POA: Diagnosis not present

## 2020-02-29 DIAGNOSIS — Z91013 Allergy to seafood: Secondary | ICD-10-CM | POA: Diagnosis not present

## 2020-02-29 DIAGNOSIS — Z880 Allergy status to penicillin: Secondary | ICD-10-CM

## 2020-02-29 DIAGNOSIS — U071 COVID-19: Secondary | ICD-10-CM | POA: Diagnosis present

## 2020-02-29 DIAGNOSIS — R0989 Other specified symptoms and signs involving the circulatory and respiratory systems: Secondary | ICD-10-CM | POA: Diagnosis not present

## 2020-02-29 DIAGNOSIS — M25551 Pain in right hip: Secondary | ICD-10-CM | POA: Diagnosis not present

## 2020-02-29 DIAGNOSIS — R7989 Other specified abnormal findings of blood chemistry: Secondary | ICD-10-CM | POA: Diagnosis not present

## 2020-02-29 DIAGNOSIS — I69354 Hemiplegia and hemiparesis following cerebral infarction affecting left non-dominant side: Secondary | ICD-10-CM

## 2020-02-29 DIAGNOSIS — R1312 Dysphagia, oropharyngeal phase: Secondary | ICD-10-CM | POA: Diagnosis not present

## 2020-02-29 DIAGNOSIS — R600 Localized edema: Secondary | ICD-10-CM | POA: Diagnosis present

## 2020-02-29 DIAGNOSIS — R29810 Facial weakness: Secondary | ICD-10-CM | POA: Diagnosis not present

## 2020-02-29 DIAGNOSIS — I700 Atherosclerosis of aorta: Secondary | ICD-10-CM | POA: Diagnosis not present

## 2020-02-29 DIAGNOSIS — S79911A Unspecified injury of right hip, initial encounter: Secondary | ICD-10-CM | POA: Diagnosis not present

## 2020-02-29 DIAGNOSIS — I70203 Unspecified atherosclerosis of native arteries of extremities, bilateral legs: Secondary | ICD-10-CM | POA: Diagnosis present

## 2020-02-29 DIAGNOSIS — W19XXXA Unspecified fall, initial encounter: Secondary | ICD-10-CM | POA: Diagnosis present

## 2020-02-29 DIAGNOSIS — Z7902 Long term (current) use of antithrombotics/antiplatelets: Secondary | ICD-10-CM

## 2020-02-29 DIAGNOSIS — M79651 Pain in right thigh: Secondary | ICD-10-CM | POA: Diagnosis not present

## 2020-02-29 DIAGNOSIS — Z888 Allergy status to other drugs, medicaments and biological substances status: Secondary | ICD-10-CM | POA: Diagnosis not present

## 2020-02-29 DIAGNOSIS — R29898 Other symptoms and signs involving the musculoskeletal system: Secondary | ICD-10-CM | POA: Diagnosis not present

## 2020-02-29 DIAGNOSIS — I9589 Other hypotension: Secondary | ICD-10-CM | POA: Diagnosis not present

## 2020-02-29 DIAGNOSIS — E872 Acidosis: Secondary | ICD-10-CM | POA: Diagnosis not present

## 2020-02-29 DIAGNOSIS — Z83438 Family history of other disorder of lipoprotein metabolism and other lipidemia: Secondary | ICD-10-CM | POA: Diagnosis not present

## 2020-02-29 DIAGNOSIS — K219 Gastro-esophageal reflux disease without esophagitis: Secondary | ICD-10-CM | POA: Diagnosis not present

## 2020-02-29 DIAGNOSIS — M1990 Unspecified osteoarthritis, unspecified site: Secondary | ICD-10-CM | POA: Diagnosis not present

## 2020-02-29 DIAGNOSIS — R52 Pain, unspecified: Secondary | ICD-10-CM | POA: Diagnosis not present

## 2020-02-29 DIAGNOSIS — F32A Depression, unspecified: Secondary | ICD-10-CM | POA: Diagnosis present

## 2020-02-29 DIAGNOSIS — R000 Tachycardia, unspecified: Secondary | ICD-10-CM | POA: Diagnosis not present

## 2020-02-29 DIAGNOSIS — I517 Cardiomegaly: Secondary | ICD-10-CM | POA: Diagnosis not present

## 2020-02-29 DIAGNOSIS — I6782 Cerebral ischemia: Secondary | ICD-10-CM | POA: Diagnosis not present

## 2020-02-29 DIAGNOSIS — E785 Hyperlipidemia, unspecified: Secondary | ICD-10-CM | POA: Diagnosis not present

## 2020-02-29 DIAGNOSIS — M1611 Unilateral primary osteoarthritis, right hip: Secondary | ICD-10-CM | POA: Diagnosis not present

## 2020-02-29 DIAGNOSIS — E7800 Pure hypercholesterolemia, unspecified: Secondary | ICD-10-CM | POA: Diagnosis not present

## 2020-02-29 DIAGNOSIS — H748X2 Other specified disorders of left middle ear and mastoid: Secondary | ICD-10-CM | POA: Diagnosis not present

## 2020-02-29 DIAGNOSIS — M6281 Muscle weakness (generalized): Secondary | ICD-10-CM | POA: Diagnosis not present

## 2020-02-29 DIAGNOSIS — E860 Dehydration: Secondary | ICD-10-CM | POA: Diagnosis not present

## 2020-02-29 DIAGNOSIS — Z043 Encounter for examination and observation following other accident: Secondary | ICD-10-CM | POA: Diagnosis not present

## 2020-02-29 DIAGNOSIS — F329 Major depressive disorder, single episode, unspecified: Secondary | ICD-10-CM | POA: Diagnosis not present

## 2020-02-29 LAB — CBC
HCT: 53.5 % — ABNORMAL HIGH (ref 36.0–46.0)
Hemoglobin: 15.9 g/dL — ABNORMAL HIGH (ref 12.0–15.0)
MCH: 25 pg — ABNORMAL LOW (ref 26.0–34.0)
MCHC: 29.7 g/dL — ABNORMAL LOW (ref 30.0–36.0)
MCV: 84.1 fL (ref 80.0–100.0)
Platelets: 233 10*3/uL (ref 150–400)
RBC: 6.36 MIL/uL — ABNORMAL HIGH (ref 3.87–5.11)
RDW: 14.6 % (ref 11.5–15.5)
WBC: 4.5 10*3/uL (ref 4.0–10.5)
nRBC: 0 % (ref 0.0–0.2)

## 2020-02-29 LAB — BASIC METABOLIC PANEL
Anion gap: 18 — ABNORMAL HIGH (ref 5–15)
BUN: 13 mg/dL (ref 8–23)
CO2: 20 mmol/L — ABNORMAL LOW (ref 22–32)
Calcium: 9.6 mg/dL (ref 8.9–10.3)
Chloride: 97 mmol/L — ABNORMAL LOW (ref 98–111)
Creatinine, Ser: 0.95 mg/dL (ref 0.44–1.00)
GFR, Estimated: >60 mL/min (ref >60)
Glucose, Bld: 84 mg/dL (ref 70–99)
Potassium: 4 mmol/L (ref 3.5–5.1)
Sodium: 135 mmol/L (ref 135–145)

## 2020-02-29 LAB — CBG MONITORING, ED: Glucose-Capillary: 113 mg/dL — ABNORMAL HIGH (ref 70–99)

## 2020-02-29 MED ORDER — LACTATED RINGERS IV BOLUS (SEPSIS)
1000.0000 mL | Freq: Once | INTRAVENOUS | Status: AC
  Administered 2020-03-01: 1000 mL via INTRAVENOUS

## 2020-02-29 NOTE — ED Notes (Addendum)
Pt brought back to Triage 1 for re-eval due to tech notifying this RN of potential decompensation. Pt diaphoretic, unresponsive/disoriented, unequal grip- weaker in R hand. Droop noted in R corner of mouth. Temp 99 oral, BP 82/61; MD Curatolo in room to eval, pt to go to room 22 when clean

## 2020-02-29 NOTE — ED Provider Notes (Signed)
I have personally seen and examined the patient. I have reviewed the documentation on PMH/FH/Soc Hx. I have discussed the plan of care with the resident and patient.  I have reviewed and agree with the resident's documentation. Please see associated encounter note.  Briefly, the patient is a 80 y.o. female here with fall.  Patient arrives with chief complaint of fall.  She had normal vitals upon arrival.  Reportedly she had fallen several nights ago and had been on the floor since not been able to get back up.  She is reporting some pain in her right thigh area.  However while patient was awaiting a bed in the ED she became somewhat unresponsive.  She is found to be hypotensive.  May be had some left-sided droop and left hand weakness.  However she did have a stroke several months ago that left her with left-sided weakness.  Looking at old neurology note from November it appears that she is overall at her neurologic baseline.  Suspect that her mental status is secondary to transient hypotension.  She appears very dry on exam with dry mucous membranes.  Likely orthostatic given that she has been may be on the floor for several days without anything to eat or drink.  He does have some pain in the right hip.  There does not appear to be anything new or focal on neuro exam to activate a code stroke.  Overall she is able to ride some history and continues to say she has been on the floor for 3 days after falling over the couch.  She was too weak to get back up.  She states that she has been using a walker at home.  EKG shows sinus rhythm.  Overall we will get CT of head and neck and pursue infectious work-up.  We will get CK.  Will give fluid bolus.  Overall suspect physical deconditioning from being on the floor for several days.  Anticipate admission for hydration and physical therapy and Occupational Therapy.  Suspect that she had some stroke reactivation from being volume down.  This chart was dictated using  voice recognition software.  Despite best efforts to proofread,  errors can occur which can change the documentation meaning.     EKG Interpretation  Date/Time:  Friday February 29 2020 19:17:46 EST Ventricular Rate:  103 PR Interval:  134 QRS Duration: 78 QT Interval:  342 QTC Calculation: 448 R Axis:   51 Text Interpretation: Sinus tachycardia with Premature atrial complexes Possible Left atrial enlargement Left ventricular hypertrophy with repolarization abnormality ( Sokolow-Lyon ) Abnormal ECG Confirmed by Virgina Norfolk [id number]) on 02/29/2020 9:48:41 PM         Virgina Norfolk, DO 02/29/20 2311

## 2020-02-29 NOTE — ED Triage Notes (Signed)
Pt arrives via EMS from home. Pt reportedly fell Tues night. Per EMS patient has been on the floor since fall. Pt HTN, has not taken meds since fall. Pt reported right upper thigh pain. Pt initially refused transport but family would like patient evaluated for fall.

## 2020-02-29 NOTE — ED Provider Notes (Signed)
[facility] EMERGENCY DEPARTMENT Provider Note   CSN: [id number] Arrival date & time: 02/29/20  1906     History Chief Complaint  Patient presents with  . Fall    [name] is a 80 y.o. female.  HPI Pt is a 80 y/o female with a history of HLD, HTN, PVD, ischemic stroke who presents to ED after being found down. Patient states that she fell at home approximately 3 days ago and was just found by family today. Patient became hypotensive while waiting in triage with pressures in the 70s-80s/60s, and was brought back urgently for further evaluation at that time. Pt is somewhat withdrawn and somnolent; unable to tell me more details of her symptoms or the circumstances of the fall. Per triage, pt initially reported R upper thigh pain.  Spoke with patient's sister Margarita Grizzle [phone]: - Pt called her sister this evening and asked her to bring her something to eat - said she hadn't eaten anything for 3 days and had been on the floor for 3 days. - When sister got there, pt was on the floor and said she couldn't get up - said both hips were hurting. - Nothing similar has happened before - however sister is concerned pt might have had another fall recently. - Pt had stroke about 4-5 months ago  - sent her to rehab first before going home. Since then you can't understand what she's saying, tends to get confused. "Seems like she's had a setback"      Past Medical History:  Diagnosis Date  . Arthritis   . Depression   . High cholesterol   . Hypertension   . Pneumonia   . Stroke [facility])    1994 no residuals    Patient Active Problem List   Diagnosis Date Noted  . Acute ischemic stroke (HCC) 10/08/2019  . Status post left rotator cuff repair 02/13/2018  . Complete rotator cuff tear of left shoulder 01/27/2018  . S/P left rotator cuff repair 01/27/2018  . PVD (peripheral vascular disease) with claudication (HCC) 05/24/2012  . Peripheral vascular disease,  unspecified (HCC) 11/24/2011  . HYPERCHOLESTEROLEMIA 04/21/2006  . OBESITY, NOS 04/21/2006  . DEPRESSION, MAJOR, RECURRENT 04/21/2006  . TOBACCO DEPENDENCE 04/21/2006  . HYPERTENSION, BENIGN SYSTEMIC 04/21/2006  . HAND PAIN 04/21/2006    Past Surgical History:  Procedure Laterality Date  . ABDOMINAL HYSTERECTOMY    . catheterization of the left superficial femoral artery  10/20/2009  . SHOULDER ARTHROSCOPY WITH ROTATOR CUFF REPAIR AND SUBACROMIAL DECOMPRESSION Left 01/27/2018   Procedure: LEFT SHOULDER ARTHROSCOPY WITH DEBRIDEMENT, SUBACROMIAL DECOMPRESSION AND ROTATOR CUFF REPAIR;  Surgeon: Kathryne Hitch, MD;  Location: [facility] ORS;  Service: Orthopedics;  Laterality: Left;  . SHOULDER SURGERY     Left with dr. Gayla Doss arthroscopy with possible rotator cuff repair  . TONSILLECTOMY       OB History   No obstetric history on file.     Family History  Problem Relation Age of Onset  . Cancer Mother   . Diabetes Mother   . Heart disease Mother   . Hypertension Mother   . Heart attack Mother   . Hyperlipidemia Mother   . Hypertension Father   . Diabetes Sister   . Heart disease Sister   . Hyperlipidemia Sister   . Hypertension Sister   . Cancer Sister   . Diabetes Brother   . Heart disease Brother   . Hypertension Brother   . Cancer Brother   .  Hyperlipidemia Brother   . Heart disease Son   . Heart attack Son        amputation    Social History   Tobacco Use  . Smoking status: Former Smoker    Packs/day: 0.50    Types: Cigarettes    Quit date: 12/17/2017    Years since quitting: 2.2  . Smokeless tobacco: Never Used  . Tobacco comment: using nic patches  Vaping Use  . Vaping Use: Never used  Substance Use Topics  . Alcohol use: Not Currently  . Drug use: No    Home Medications Prior to Admission medications   Medication Sig Start Date End Date Taking? Authorizing Provider  albuterol (VENTOLIN HFA) 108 (90 Base) MCG/ACT inhaler Inhale 2 puffs into  the lungs every 6 (six) hours as needed for wheezing or shortness of breath.  09/15/19   [provider]  amLODipine (NORVASC) 10 MG tablet Take 1 tablet (10 mg total) by mouth daily. 10/12/19 11/11/19  [place], Alvira Philips, DO  atorvastatin (LIPITOR) 10 MG tablet Take 1 tablet (10 mg total) by mouth daily. 10/12/19 11/11/19  [place], Alvira Philips, DO  buPROPion (WELLBUTRIN XL) 150 MG 24 hr tablet Take 1 tablet (150 mg total) by mouth every morning. 10/12/19 11/11/19  [place], Alvira Philips, DO  Calcium Carbonate-Vitamin D (CALCIUM 600+D HIGH POTENCY PO) Take 1 tablet by mouth daily.     [provider]  Cholecalciferol (VITAMIN D) 2000 UNITS tablet Take 2,000 Units by mouth daily.    [provider]  clopidogrel (PLAVIX) 75 MG tablet Take 1 tablet (75 mg total) by mouth daily. 12/25/19   Ihor Austin, NP  escitalopram (LEXAPRO) 10 MG tablet 1  qam 10/12/19   [place], Alvira Philips, DO  ezetimibe (ZETIA) 10 MG tablet Take 1 tablet (10 mg total) by mouth daily. 10/12/19 11/11/19  [place], Alvira Philips, DO  hydrALAZINE (APRESOLINE) 25 MG tablet Take 25 mg by mouth daily. 11/26/19   [provider]  hydrochlorothiazide (HYDRODIURIL) 25 MG tablet Take 25 mg by mouth daily. 11/27/19   [provider]  lisinopril (ZESTRIL) 40 MG tablet Take 1 tablet (40 mg total) by mouth daily. 10/12/19 11/11/19  [place], Alvira Philips, DO  metoprolol tartrate (LOPRESSOR) 100 MG tablet Take 100 mg by mouth daily. 11/08/19   [provider]  MYRBETRIQ 25 MG TB24 tablet Take 1 tablet (25 mg total) by mouth daily. 10/12/19 11/11/19  [place], Alvira Philips, DO  pantoprazole (PROTONIX) 20 MG tablet Take 1 tablet (20 mg total) by mouth daily. 10/12/19 11/11/19  [place], Eric J, DO   Allergies    Penicillins, Shellfish allergy, and Zoloft [sertraline]  Review of Systems   Review of Systems  Unable to perform ROS: Mental status change    Physical Exam Updated Vital Signs BP (!) 82/61 (BP Location: Left Arm)   Pulse 84   Temp  99 F (37.2 C) (Oral)   Resp 14   SpO2 98%   Physical Exam Vitals and nursing note reviewed.  Constitutional:      Appearance: She is well-developed and well-nourished. She is ill-appearing and diaphoretic.  HENT:     Head: Normocephalic and atraumatic.     Nose: Nose normal.     Mouth/Throat:     Mouth: Mucous membranes are dry.     Comments: Left facial droop Eyes:     General: No scleral icterus.    Extraocular Movements: Extraocular movements intact.  Cardiovascular:     Rate and Rhythm:  Normal rate and regular rhythm.     Pulses: Normal pulses.     Heart sounds: No friction rub.  Pulmonary:     Effort: Pulmonary effort is normal. No respiratory distress.     Breath sounds: Rhonchi (R lung fields) present. No wheezing.  Abdominal:     Palpations: Abdomen is soft.     Tenderness: There is no abdominal tenderness. There is no guarding or rebound.  Musculoskeletal:        General: No edema.     Cervical back: Neck supple.     Comments: Trace nonpitting bilateral lower leg edema  Skin:    General: Skin is warm.  Neurological:     Mental Status: She is alert.     GCS: GCS eye subscore is 4. GCS verbal subscore is 4. GCS motor subscore is 6.     Cranial Nerves: Facial asymmetry present.  Psychiatric:        Mood and Affect: Mood and affect normal.     ED Results / Procedures / Treatments   Labs (all labs ordered are listed, but only abnormal results are displayed) Labs Reviewed  BASIC METABOLIC PANEL - Abnormal; Notable for the following components:      Result Value   Chloride 97 (*)    CO2 20 (*)    Anion gap 18 (*)    All other components within normal limits  CBC - Abnormal; Notable for the following components:   RBC 6.36 (*)    Hemoglobin 15.9 (*)    HCT 53.5 (*)    MCH 25.0 (*)    MCHC 29.7 (*)    All other components within normal limits  CBG MONITORING, ED - Abnormal; Notable for the following components:   Glucose-Capillary 113 (*)    All other  components within normal limits  URINALYSIS, ROUTINE W REFLEX MICROSCOPIC    EKG EKG Interpretation  Date/Time:  Friday February 29 2020 19:17:46 EST Ventricular Rate:  103 PR Interval:  134 QRS Duration: 78 QT Interval:  342 QTC Calculation: 448 R Axis:   51 Text Interpretation: Sinus tachycardia with Premature atrial complexes Possible Left atrial enlargement Left ventricular hypertrophy with repolarization abnormality ( Sokolow-Lyon ) Abnormal ECG Confirmed by Lennice Sites [phone]) on 02/29/2020 9:48:41 PM   Radiology DG Hip Unilat W or Wo Pelvis 2-3 Views Left  Result Date: 02/29/2020 CLINICAL DATA:  Fall several days ago with pain. EXAM: DG HIP (WITH OR WITHOUT PELVIS) 2-3V LEFT COMPARISON:  None. FINDINGS: Degenerative changes of the right hip with osteophytes. No left hip fracture identified. No dislocation. Vascular calcifications in the femoral arteries bilaterally. IMPRESSION: Degenerative changes. Vascular calcifications in the bilateral femoral arteries. No left hip fracture identified. Electronically Signed   By: Dorise Bullion III M.D   On: 02/29/2020 19:55    Procedures Procedures (including critical care time)  Medications Ordered in ED Medications  lactated ringers bolus 1,000 mL (0 mLs Intravenous Stopped 03/01/20 0237)    ED Course  I have reviewed the triage vital signs and the nursing notes.  Pertinent labs & imaging results that were available during my care of the patient were reviewed by me and considered in my medical decision making (see chart for details).    MDM Rules/Calculators/A&P                         80 y/o female with fall, extended downtime at home. Pt complaining of hip pain but unable  to provide much detail regarding the circumstances of her accident.  1 L LR bolus ordered as well as broad lab work-up.  Majority of labs are pending at time of handoff to night team.  However, VBG shows respiratory acidosis and patient has elevated CK (1831)  and troponin (225) with lactic acid 2.5.  Patient's hemoglobin and hematocrit are elevated concerning for hemoconcentration in this patient who is likely quite dehydrated.  Blood and urine cultures are pending. ECG shows sinus tachycardia with a rate of 103. PR 134, QRS 78, QTc 448. Normal axis. Likely L atrial enlargement. There is no STEMI. There are ST changes and T wave inversions in the lateral leads. Imaging interpreted by radiology and images personally reviewed. R femur XR shows possible R femoral neck fracture - will assess with CT of R hip.  CT head and cervical spine show no acute findings.  On reassessment, patient able to tell me that she fell while trying to transfer to a chair.  She denies head injury or loss of consciousness. Complains of pain in the hips.  At time of handoff to night team, numerous labs and CT of the right hip pending.  Have communicated history, work-up, and current plan to oncoming ED provider Roxy Horseman, PA-C.  Please see his note for documentation of the remainder of this encounter and final disposition.  Anticipate likely admission for patient's change in mental status with debility/difficulty performing her ADLs and mobilizing at home (where she lives alone).  Final Clinical Impression(s) / ED Diagnoses Final diagnoses:  Fall, initial encounter     Corliss Blacker, MD 03/01/20 0416    Virgina Norfolk, DO 03/01/20 1811

## 2020-03-01 ENCOUNTER — Observation Stay (HOSPITAL_COMMUNITY): Payer: Medicare Other

## 2020-03-01 ENCOUNTER — Emergency Department (HOSPITAL_COMMUNITY): Payer: Medicare Other

## 2020-03-01 ENCOUNTER — Observation Stay (HOSPITAL_BASED_OUTPATIENT_CLINIC_OR_DEPARTMENT_OTHER): Payer: Medicare Other

## 2020-03-01 DIAGNOSIS — F32A Depression, unspecified: Secondary | ICD-10-CM | POA: Diagnosis present

## 2020-03-01 DIAGNOSIS — R600 Localized edema: Secondary | ICD-10-CM

## 2020-03-01 DIAGNOSIS — I69354 Hemiplegia and hemiparesis following cerebral infarction affecting left non-dominant side: Secondary | ICD-10-CM | POA: Diagnosis not present

## 2020-03-01 DIAGNOSIS — I6389 Other cerebral infarction: Secondary | ICD-10-CM | POA: Diagnosis not present

## 2020-03-01 DIAGNOSIS — I10 Essential (primary) hypertension: Secondary | ICD-10-CM | POA: Diagnosis present

## 2020-03-01 DIAGNOSIS — Z79899 Other long term (current) drug therapy: Secondary | ICD-10-CM | POA: Diagnosis not present

## 2020-03-01 DIAGNOSIS — R778 Other specified abnormalities of plasma proteins: Secondary | ICD-10-CM

## 2020-03-01 DIAGNOSIS — T796XXA Traumatic ischemia of muscle, initial encounter: Secondary | ICD-10-CM | POA: Diagnosis present

## 2020-03-01 DIAGNOSIS — W19XXXA Unspecified fall, initial encounter: Secondary | ICD-10-CM | POA: Diagnosis present

## 2020-03-01 DIAGNOSIS — Z87891 Personal history of nicotine dependence: Secondary | ICD-10-CM | POA: Diagnosis not present

## 2020-03-01 DIAGNOSIS — Z8249 Family history of ischemic heart disease and other diseases of the circulatory system: Secondary | ICD-10-CM | POA: Diagnosis not present

## 2020-03-01 DIAGNOSIS — J3489 Other specified disorders of nose and nasal sinuses: Secondary | ICD-10-CM | POA: Diagnosis not present

## 2020-03-01 DIAGNOSIS — U071 COVID-19: Secondary | ICD-10-CM | POA: Diagnosis present

## 2020-03-01 DIAGNOSIS — Z83438 Family history of other disorder of lipoprotein metabolism and other lipidemia: Secondary | ICD-10-CM | POA: Diagnosis not present

## 2020-03-01 DIAGNOSIS — M6282 Rhabdomyolysis: Secondary | ICD-10-CM | POA: Diagnosis present

## 2020-03-01 DIAGNOSIS — E860 Dehydration: Secondary | ICD-10-CM | POA: Diagnosis present

## 2020-03-01 DIAGNOSIS — I9589 Other hypotension: Secondary | ICD-10-CM | POA: Diagnosis not present

## 2020-03-01 DIAGNOSIS — I70203 Unspecified atherosclerosis of native arteries of extremities, bilateral legs: Secondary | ICD-10-CM | POA: Diagnosis present

## 2020-03-01 DIAGNOSIS — Z888 Allergy status to other drugs, medicaments and biological substances status: Secondary | ICD-10-CM | POA: Diagnosis not present

## 2020-03-01 DIAGNOSIS — Z880 Allergy status to penicillin: Secondary | ICD-10-CM | POA: Diagnosis not present

## 2020-03-01 DIAGNOSIS — R7989 Other specified abnormal findings of blood chemistry: Secondary | ICD-10-CM | POA: Diagnosis not present

## 2020-03-01 DIAGNOSIS — E7800 Pure hypercholesterolemia, unspecified: Secondary | ICD-10-CM | POA: Diagnosis present

## 2020-03-01 DIAGNOSIS — E872 Acidosis: Secondary | ICD-10-CM | POA: Diagnosis present

## 2020-03-01 DIAGNOSIS — I700 Atherosclerosis of aorta: Secondary | ICD-10-CM | POA: Diagnosis not present

## 2020-03-01 DIAGNOSIS — I959 Hypotension, unspecified: Secondary | ICD-10-CM

## 2020-03-01 DIAGNOSIS — R29898 Other symptoms and signs involving the musculoskeletal system: Secondary | ICD-10-CM | POA: Diagnosis not present

## 2020-03-01 DIAGNOSIS — R29810 Facial weakness: Secondary | ICD-10-CM | POA: Diagnosis present

## 2020-03-01 DIAGNOSIS — H748X2 Other specified disorders of left middle ear and mastoid: Secondary | ICD-10-CM | POA: Diagnosis not present

## 2020-03-01 DIAGNOSIS — I6782 Cerebral ischemia: Secondary | ICD-10-CM | POA: Diagnosis not present

## 2020-03-01 DIAGNOSIS — Y92009 Unspecified place in unspecified non-institutional (private) residence as the place of occurrence of the external cause: Secondary | ICD-10-CM | POA: Diagnosis not present

## 2020-03-01 DIAGNOSIS — Z91013 Allergy to seafood: Secondary | ICD-10-CM | POA: Diagnosis not present

## 2020-03-01 DIAGNOSIS — Z7902 Long term (current) use of antithrombotics/antiplatelets: Secondary | ICD-10-CM | POA: Diagnosis not present

## 2020-03-01 DIAGNOSIS — Z90710 Acquired absence of both cervix and uterus: Secondary | ICD-10-CM | POA: Diagnosis not present

## 2020-03-01 DIAGNOSIS — E785 Hyperlipidemia, unspecified: Secondary | ICD-10-CM | POA: Diagnosis present

## 2020-03-01 DIAGNOSIS — M1611 Unilateral primary osteoarthritis, right hip: Secondary | ICD-10-CM | POA: Diagnosis not present

## 2020-03-01 LAB — RAPID URINE DRUG SCREEN, HOSP PERFORMED
Amphetamines: NOT DETECTED
Barbiturates: NOT DETECTED
Benzodiazepines: NOT DETECTED
Cocaine: NOT DETECTED
Opiates: NOT DETECTED
Tetrahydrocannabinol: NOT DETECTED

## 2020-03-01 LAB — BASIC METABOLIC PANEL
Anion gap: 16 — ABNORMAL HIGH (ref 5–15)
BUN: 25 mg/dL — ABNORMAL HIGH (ref 8–23)
CO2: 20 mmol/L — ABNORMAL LOW (ref 22–32)
Calcium: 9.5 mg/dL (ref 8.9–10.3)
Chloride: 99 mmol/L (ref 98–111)
Creatinine, Ser: 1.44 mg/dL — ABNORMAL HIGH (ref 0.44–1.00)
GFR, Estimated: 37 mL/min — ABNORMAL LOW (ref >60)
Glucose, Bld: 96 mg/dL (ref 70–99)
Potassium: 3.6 mmol/L (ref 3.5–5.1)
Sodium: 135 mmol/L (ref 135–145)

## 2020-03-01 LAB — ECHOCARDIOGRAM LIMITED
AR max vel: 3.32 cm2
AV Area VTI: 3.78 cm2
AV Area mean vel: 2.66 cm2
AV Mean grad: 4 mmHg
AV Peak grad: 8.5 mmHg
Ao pk vel: 1.46 m/s
Area-P 1/2: 4.68 cm2
S' Lateral: 2.8 cm

## 2020-03-01 LAB — CK
Total CK: 1236 U/L — ABNORMAL HIGH (ref 38–234)
Total CK: 1831 U/L — ABNORMAL HIGH (ref 38–234)

## 2020-03-01 LAB — URINALYSIS, ROUTINE W REFLEX MICROSCOPIC
Bilirubin Urine: NEGATIVE
Glucose, UA: NEGATIVE mg/dL
Hgb urine dipstick: NEGATIVE
Ketones, ur: NEGATIVE mg/dL
Leukocytes,Ua: NEGATIVE
Nitrite: NEGATIVE
Protein, ur: 100 mg/dL — AB
Specific Gravity, Urine: 1.017 (ref 1.005–1.030)
pH: 5 (ref 5.0–8.0)

## 2020-03-01 LAB — HEPATIC FUNCTION PANEL
ALT: 15 U/L (ref 0–44)
AST: 65 U/L — ABNORMAL HIGH (ref 15–41)
Albumin: 4.8 g/dL (ref 3.5–5.0)
Alkaline Phosphatase: 57 U/L (ref 38–126)
Bilirubin, Direct: 0.3 mg/dL — ABNORMAL HIGH (ref 0.0–0.2)
Indirect Bilirubin: 1.3 mg/dL — ABNORMAL HIGH (ref 0.3–0.9)
Total Bilirubin: 1.6 mg/dL — ABNORMAL HIGH (ref 0.3–1.2)
Total Protein: 8.8 g/dL — ABNORMAL HIGH (ref 6.5–8.1)

## 2020-03-01 LAB — T4, FREE: Free T4: 1.03 ng/dL (ref 0.61–1.12)

## 2020-03-01 LAB — I-STAT VENOUS BLOOD GAS, ED
Acid-base deficit: 1 mmol/L (ref 0.0–2.0)
Bicarbonate: 27.5 mmol/L (ref 20.0–28.0)
Calcium, Ion: 1.18 mmol/L (ref 1.15–1.40)
HCT: 57 % — ABNORMAL HIGH (ref 36.0–46.0)
Hemoglobin: 19.4 g/dL — ABNORMAL HIGH (ref 12.0–15.0)
O2 Saturation: 70 %
Potassium: 4 mmol/L (ref 3.5–5.1)
Sodium: 136 mmol/L (ref 135–145)
TCO2: 29 mmol/L (ref 22–32)
pCO2, Ven: 56.7 mmHg (ref 44.0–60.0)
pH, Ven: 7.295 (ref 7.250–7.430)
pO2, Ven: 42 mmHg (ref 32.0–45.0)

## 2020-03-01 LAB — CBG MONITORING, ED: Glucose-Capillary: 90 mg/dL (ref 70–99)

## 2020-03-01 LAB — APTT: aPTT: 29 seconds (ref 24–36)

## 2020-03-01 LAB — PROCALCITONIN: Procalcitonin: 0.21 ng/mL

## 2020-03-01 LAB — D-DIMER, QUANTITATIVE: D-Dimer, Quant: 1.98 ug/mL-FEU — ABNORMAL HIGH (ref 0.00–0.50)

## 2020-03-01 LAB — LACTIC ACID, PLASMA
Lactic Acid, Venous: 1.8 mmol/L (ref 0.5–1.9)
Lactic Acid, Venous: 2.5 mmol/L (ref 0.5–1.9)
Lactic Acid, Venous: 2.5 mmol/L (ref 0.5–1.9)

## 2020-03-01 LAB — TROPONIN I (HIGH SENSITIVITY)
Troponin I (High Sensitivity): 146 ng/L (ref <18)
Troponin I (High Sensitivity): 225 ng/L (ref <18)

## 2020-03-01 LAB — SALICYLATE LEVEL: Salicylate Lvl: <7 mg/dL — ABNORMAL LOW (ref 7.0–30.0)

## 2020-03-01 LAB — ACETAMINOPHEN LEVEL: Acetaminophen (Tylenol), Serum: <10 ug/mL — ABNORMAL LOW (ref 10–30)

## 2020-03-01 LAB — PROTIME-INR
INR: 1 (ref 0.8–1.2)
Prothrombin Time: 12.7 seconds (ref 11.4–15.2)

## 2020-03-01 LAB — SARS CORONAVIRUS 2 (TAT 6-24 HRS): SARS Coronavirus 2: POSITIVE — AB

## 2020-03-01 LAB — LACTATE DEHYDROGENASE: LDH: 283 U/L — ABNORMAL HIGH (ref 98–192)

## 2020-03-01 LAB — TSH: TSH: 2.662 u[IU]/mL (ref 0.350–4.500)

## 2020-03-01 LAB — FIBRINOGEN: Fibrinogen: 367 mg/dL (ref 210–475)

## 2020-03-01 LAB — C-REACTIVE PROTEIN: CRP: 0.8 mg/dL (ref <1.0)

## 2020-03-01 LAB — TRIGLYCERIDES: Triglycerides: 107 mg/dL (ref <150)

## 2020-03-01 LAB — FERRITIN: Ferritin: 358 ng/mL — ABNORMAL HIGH (ref 11–307)

## 2020-03-01 MED ORDER — ENOXAPARIN SODIUM 40 MG/0.4ML ~~LOC~~ SOLN
40.0000 mg | SUBCUTANEOUS | Status: DC
  Administered 2020-03-01 – 2020-03-06 (×6): 40 mg via SUBCUTANEOUS
  Filled 2020-03-01 (×6): qty 0.4

## 2020-03-01 MED ORDER — ACETAMINOPHEN 325 MG PO TABS
650.0000 mg | ORAL_TABLET | Freq: Four times a day (QID) | ORAL | Status: DC | PRN
  Administered 2020-03-03 (×2): 650 mg via ORAL
  Filled 2020-03-01 (×2): qty 2

## 2020-03-01 MED ORDER — SODIUM CHLORIDE 0.9 % IV SOLN: INTRAVENOUS | Status: AC

## 2020-03-01 NOTE — ED Provider Notes (Signed)
Signed out to me at shift change.  Patient with fall.  Has been down possibly several days.  Very dry appearing.  Trop 225.  Cr. 0.95.  CK 1800.    Imaging negative for acute traumatic findings.    Appreciate Dr. Loney Loh for admitting.   Roxy Horseman, PA-C 03/01/20 0129    Mesner, Barbara Cower, MD 03/01/20 [phone]

## 2020-03-01 NOTE — ED Notes (Signed)
This RN made multiple attempts to call pts sister, son and daughter to give an update, no answer from any one

## 2020-03-01 NOTE — ED Notes (Signed)
Patient transported to CT 

## 2020-03-01 NOTE — ED Notes (Signed)
Patient transported to MRI 

## 2020-03-01 NOTE — Hospital Course (Addendum)
80 year old woman reportedly lives alone, PMH stroke with residual left-sided weakness, hypertension, presented to the emergency department after found down alone at home, reportedly on the floor for 3 days.  Noted to be very dry, dehydrated.  Admitted for acute rhabdomyolysis, secondary to prolonged immobility, status post fall; hypotension secondary to dehydration, left-sided weakness noted.  Incidentally found to be Covid positive.  Rhabdomyolysis resolved with appropriate treatment as did hypotension and dehydration.  Left-sided weakness chronic, MRI unrevealing.  PT recommended SNF.  Medically stable, awaiting bed.  A & P  Acute rhabdomyolysis secondary to fall at home with prolonged immobility for 3 days.  Fortunately no bony injury or other injuries noted. -- Resolved.  Continue to hold statin.  Fall at home.  No apparent injury.  Reportedly lives alone. --PT, OT recommended SNF.  Troponin elevation, no signs or symptoms to suggest ACS at this point. --Troponins flat.  EKG nonacute, no ischemic changes seen. --Probably related to rhabdomyolysis.  No further evaluation suggested.   Left-sided facial droop, left-sided weakness; PMH stroke w/ left-sided weakness. --Had left-sided weakness status post stroke August 2021.  Appears to be at baseline at this point.  MRI negative for acute process. --continue Plavix, can resume statin on discharge. Amlodipine.  COVID-19 positive.  Incidental.  No respiratory compromise. -- Remains asymptomatic, chest x-ray was clear, CRP was unremarkable.  Elevated D-dimer probably from Covid.  Continue supportive care.  Hypotension in the emergency department, most consistent with severe dehydration.  Mild lactic acidosis probably from severe dehydration.  No fever or leukocytosis, no signs or symptoms of infection or sepsis. --Resolved --Procalcitonin 0.21, no evidence of infeciton  Elevated anion gap metabolic acidosis likely secondary to lactic acid  elevation secondary to dehydration. --Resolved.   Mild right lower extremity edema --Given elevated D-dimer, checked venous Doppler. No history to suggest PE.  Doppler was negative.  No further evaluation suggested.

## 2020-03-01 NOTE — H&P (Signed)
History and Physical    [name] UYZ:[id number] DOB: [birth year] DOA: 02/29/2020  PCP: Marton Redwood, MD Patient coming from: Home  Chief Complaint: Fall  HPI: [name] is a 80 y.o. female with medical history significant of hypertension, hyperlipidemia, stroke in 1994 and August 2021, depression, GERD presented to the ED via EMS who reported that patient had a fall about 3 days ago and since then has been on the floor and was found by family today.  She complained of right upper thigh pain.     History limited due to somnolence.  She does not remember how she fell.  She has no complaints.  Denies fevers, chills, cough, shortness of breath, chest pain, nausea, vomiting, abdominal pain, diarrhea, or dysuria.  Unclear whether she is vaccinated against Covid.  ED Course: Afebrile. Patient became hypotensive and less responsive in triage with left-sided facial droop and left hand weakness.  Neuro deficits were felt to be related to her prior stroke.  Blood pressure improved after 1 L fluid bolus.  No tachycardia.  WBC 4.1, hemoglobin 15.9, platelet count 233K.  Sodium 135, potassium 4.0, chloride 97, bicarb 20, anion gap 18, BUN 13, creatinine 0.9, glucose 84.  SARS-CoV-2 PCR test positive.  Lactic acid 2.5 >2.5.  INR 1.0.  CK 1831.  AST 65, ALT 15, alk phos 57, T bili 1.6.  TSH and free T4 normal.  Salicylate level undetectable.  Acetaminophen level undetectable.  High-sensitivity troponin 225.  EKG without acute changes.  Blood culture x2 pending.  UDS negative.  UA with negative nitrite, negative leukocytes, 6-10 WBCs, and rare bacteria.  Urine culture pending.  Chest x-ray not suggestive of pneumonia.  X-ray of left hip/pelvis negative for fracture.  X-ray of right femur showing oblique area of sclerosis involving the femoral neck, may represent an impaction fracture versus overlapping osteophytes.  CT of right hip negative for fracture.  Showing mild soft tissue changes of the lateral right  hip which could reflect contusive change or soft tissue edema.  Minimal edematous changes of the right gluteal musculature as well.  No large hematoma.  No soft tissue gas or foreign body.  Head CT negative for acute intracranial abnormality.  CT C-spine negative for acute finding.  Review of Systems:  All systems reviewed and apart from history of presenting illness, are negative.  Past Medical History:  Diagnosis Date  . Arthritis   . Depression   . High cholesterol   . Hypertension   . Pneumonia   . Stroke [facility])    1994 no residuals    Past Surgical History:  Procedure Laterality Date  . ABDOMINAL HYSTERECTOMY    . catheterization of the left superficial femoral artery  10/20/2009  . SHOULDER ARTHROSCOPY WITH ROTATOR CUFF REPAIR AND SUBACROMIAL DECOMPRESSION Left 01/27/2018   Procedure: LEFT SHOULDER ARTHROSCOPY WITH DEBRIDEMENT, SUBACROMIAL DECOMPRESSION AND ROTATOR CUFF REPAIR;  Surgeon: Mcarthur Rossetti, MD;  Location: [facility];  Service: Orthopedics;  Laterality: Left;  . SHOULDER SURGERY     Left with dr. Felicie Morn arthroscopy with possible rotator cuff repair  . TONSILLECTOMY       reports that she quit smoking about 2 years ago. Her smoking use included cigarettes. She smoked 0.50 packs per day. She has never used smokeless tobacco. She reports previous alcohol use. She reports that she does not use drugs.  Allergies  Allergen Reactions  . Penicillins Swelling and Other (See Comments)    Has patient had a PCN reaction causing  immediate rash, facial/tongue/throat swelling, SOB or lightheadedness with hypotension: Yes Has patient had a PCN reaction causing severe rash involving mucus membranes or skin necrosis: No Has patient had a PCN reaction that required hospitalization: No Has patient had a PCN reaction occurring within the last 10 years: No If all of the above answers are "NO", then may proceed with Cephalosporin use.   . Shellfish Allergy Itching and  Swelling  . Zoloft [Sertraline] Rash    Family History  Problem Relation Age of Onset  . Cancer Mother   . Diabetes Mother   . Heart disease Mother   . Hypertension Mother   . Heart attack Mother   . Hyperlipidemia Mother   . Hypertension Father   . Diabetes Sister   . Heart disease Sister   . Hyperlipidemia Sister   . Hypertension Sister   . Cancer Sister   . Diabetes Brother   . Heart disease Brother   . Hypertension Brother   . Cancer Brother   . Hyperlipidemia Brother   . Heart disease Son   . Heart attack Son        amputation    Prior to Admission medications   Medication Sig Start Date End Date Taking? Authorizing Provider  albuterol (VENTOLIN HFA) 108 (90 Base) MCG/ACT inhaler Inhale 2 puffs into the lungs every 6 (six) hours as needed for wheezing or shortness of breath.  09/15/19   [provider]  amLODipine (NORVASC) 10 MG tablet Take 1 tablet (10 mg total) by mouth daily. 10/12/19 11/11/19  [place] Donnamarie Poag, DO  atorvastatin (LIPITOR) 10 MG tablet Take 1 tablet (10 mg total) by mouth daily. 10/12/19 11/11/19  [place] Donnamarie Poag, DO  buPROPion (WELLBUTRIN XL) 150 MG 24 hr tablet Take 1 tablet (150 mg total) by mouth every morning. 10/12/19 11/11/19  [place] Donnamarie Poag, DO  Calcium Carbonate-Vitamin D (CALCIUM 600+D HIGH POTENCY PO) Take 1 tablet by mouth daily.     [provider]  Cholecalciferol (VITAMIN D) 2000 UNITS tablet Take 2,000 Units by mouth daily.    [provider]  clopidogrel (PLAVIX) 75 MG tablet Take 1 tablet (75 mg total) by mouth daily. 12/25/19   Frann Rider, NP  escitalopram (LEXAPRO) 10 MG tablet 1  qam 10/12/19   [place] Donnamarie Poag, DO  ezetimibe (ZETIA) 10 MG tablet Take 1 tablet (10 mg total) by mouth daily. 10/12/19 11/11/19  [place] Donnamarie Poag, DO  hydrALAZINE (APRESOLINE) 25 MG tablet Take 25 mg by mouth daily. 11/26/19   [provider]  hydrochlorothiazide (HYDRODIURIL) 25 MG tablet Take 25 mg by mouth daily. 11/27/19    [provider]  lisinopril (ZESTRIL) 40 MG tablet Take 1 tablet (40 mg total) by mouth daily. 10/12/19 11/11/19  [place] Donnamarie Poag, DO  metoprolol tartrate (LOPRESSOR) 100 MG tablet Take 100 mg by mouth daily. 11/08/19   [provider]  MYRBETRIQ 25 MG TB24 tablet Take 1 tablet (25 mg total) by mouth daily. 10/12/19 11/11/19  [place] Donnamarie Poag, DO  pantoprazole (PROTONIX) 20 MG tablet Take 1 tablet (20 mg total) by mouth daily. 10/12/19 11/11/19  [place] Eric J, DO    Physical Exam: Vitals:   03/01/20 0615 03/01/20 0630 03/01/20 0645 03/01/20 0823  BP: 118/71 113/68 125/75 125/79  Pulse: 91 91 91 88  Resp: 20 20 (!) 21 20  Temp:    98.5 F (36.9 C)  TempSrc:    Oral  SpO2: 97% 98% 97% 97%    Physical  Exam Constitutional:      General: She is not in acute distress.    Appearance: She is not diaphoretic.  HENT:     Head: Normocephalic and atraumatic.     Mouth/Throat:     Pharynx: Oropharynx is clear.     Comments: Very dry mucous membranes Eyes:     Extraocular Movements: Extraocular movements intact.     Conjunctiva/sclera: Conjunctivae normal.  Cardiovascular:     Rate and Rhythm: Normal rate and regular rhythm.     Pulses: Normal pulses.  Pulmonary:     Effort: Pulmonary effort is normal. No respiratory distress.     Breath sounds: No wheezing or rales.     Comments: Satting in the upper 90s on room air Abdominal:     General: Bowel sounds are normal. There is no distension.     Palpations: Abdomen is soft.     Tenderness: There is no abdominal tenderness.  Musculoskeletal:        General: No swelling or tenderness.     Cervical back: Normal range of motion and neck supple.  Skin:    General: Skin is warm and dry.  Neurological:     Mental Status: She is oriented to person, place, and time.     Comments: Somnolent     Labs on Admission: I have personally reviewed following labs and imaging studies  CBC: Recent Labs  Lab 02/29/20 2010  02/29/20 2357  WBC 4.5  --   HGB 15.9* 19.4*  HCT 53.5* 57.0*  MCV 84.1  --   PLT 233  --    Basic Metabolic Panel: Recent Labs  Lab 02/29/20 2010 02/29/20 2357  NA 135 136  K 4.0 4.0  CL 97*  --   CO2 20*  --   GLUCOSE 84  --   BUN 13  --   CREATININE 0.95  --   CALCIUM 9.6  --    GFR: CrCl cannot be calculated (Unknown ideal weight.). Liver Function Tests: Recent Labs  Lab 02/29/20 2340  AST 65*  ALT 15  ALKPHOS 57  BILITOT 1.6*  PROT 8.8*  ALBUMIN 4.8   No results for input(s): LIPASE, AMYLASE in the last 168 hours. No results for input(s): AMMONIA in the last 168 hours. Coagulation Profile: Recent Labs  Lab 02/29/20 2340  INR 1.0   Cardiac Enzymes: Recent Labs  Lab 02/29/20 2340  CKTOTAL 1,831*   BNP (last 3 results) No results for input(s): PROBNP in the last 8760 hours. HbA1C: No results for input(s): HGBA1C in the last 72 hours. CBG: Recent Labs  Lab 02/29/20 2103 03/01/20 0818  GLUCAP 113* 90   Lipid Profile: No results for input(s): CHOL, HDL, LDLCALC, TRIG, CHOLHDL, LDLDIRECT in the last 72 hours. Thyroid Function Tests: Recent Labs    02/29/20 2340  TSH 2.662  FREET4 1.03   Anemia Panel: No results for input(s): VITAMINB12, FOLATE, FERRITIN, TIBC, IRON, RETICCTPCT in the last 72 hours. Urine analysis:    Component Value Date/Time   COLORURINE AMBER (A) 03/01/2020 0133   APPEARANCEUR CLOUDY (A) 03/01/2020 0133   LABSPEC 1.017 03/01/2020 0133   PHURINE 5.0 03/01/2020 0133   GLUCOSEU NEGATIVE 03/01/2020 0133   HGBUR NEGATIVE 03/01/2020 0133   BILIRUBINUR NEGATIVE 03/01/2020 0133   KETONESUR NEGATIVE 03/01/2020 0133   PROTEINUR 100 (A) 03/01/2020 0133   UROBILINOGEN 1.0 12/09/2008 1557   NITRITE NEGATIVE 03/01/2020 0133   LEUKOCYTESUR NEGATIVE 03/01/2020 0133    Radiological Exams on Admission: CT  Head Wo Contrast  Result Date: 02/29/2020 CLINICAL DATA:  Neck trauma, intoxicated or obtunded (Age >= 16y) fall, unknown  downtime EXAM: CT HEAD WITHOUT CONTRAST CT CERVICAL SPINE WITHOUT CONTRAST TECHNIQUE: Multidetector CT imaging of the head and cervical spine was performed following the standard protocol without intravenous contrast. Multiplanar CT image reconstructions of the cervical spine were also generated. COMPARISON:  CT head and cervical spine 10/22/2018. FINDINGS: CT HEAD FINDINGS Brain: Patchy and confluent areas of decreased attenuation are noted throughout the deep and periventricular white matter of the cerebral hemispheres bilaterally, compatible with chronic microvascular ischemic disease. Similar-appearing right occipital encephalomalacia. No evidence of large-territorial acute infarction. No parenchymal hemorrhage. No mass lesion. No extra-axial collection. No mass effect or midline shift. No hydrocephalus. Basilar cisterns are patent. Vascular: No hyperdense vessel. Skull: No acute fracture or focal lesion. Sinuses/Orbits: Paranasal sinuses and mastoid air cells are clear. The orbits are unremarkable. Other: None. CT CERVICAL SPINE FINDINGS Alignment: Head rotation.  Normal. Skull base and vertebrae: Multilevel degenerative changes of the spine worse at the C5-C6 level. No acute fracture. No aggressive appearing focal osseous lesion or focal pathologic process. Soft tissues and spinal canal: No prevertebral fluid or swelling. No visible canal hematoma. Disc levels:  Maintained. Upper chest: Unremarkable. Other: None. IMPRESSION: 1. No acute intracranial abnormality. 2. No acute displaced fracture or traumatic listhesis of the cervical spine. Electronically Signed   By: Iven Finn M.D.   On: 02/29/2020 22:31   CT Cervical Spine Wo Contrast  Result Date: 02/29/2020 CLINICAL DATA:  Neck trauma, intoxicated or obtunded (Age >= 16y) fall, unknown downtime EXAM: CT HEAD WITHOUT CONTRAST CT CERVICAL SPINE WITHOUT CONTRAST TECHNIQUE: Multidetector CT imaging of the head and cervical spine was performed following  the standard protocol without intravenous contrast. Multiplanar CT image reconstructions of the cervical spine were also generated. COMPARISON:  CT head and cervical spine 10/22/2018. FINDINGS: CT HEAD FINDINGS Brain: Patchy and confluent areas of decreased attenuation are noted throughout the deep and periventricular white matter of the cerebral hemispheres bilaterally, compatible with chronic microvascular ischemic disease. Similar-appearing right occipital encephalomalacia. No evidence of large-territorial acute infarction. No parenchymal hemorrhage. No mass lesion. No extra-axial collection. No mass effect or midline shift. No hydrocephalus. Basilar cisterns are patent. Vascular: No hyperdense vessel. Skull: No acute fracture or focal lesion. Sinuses/Orbits: Paranasal sinuses and mastoid air cells are clear. The orbits are unremarkable. Other: None. CT CERVICAL SPINE FINDINGS Alignment: Head rotation.  Normal. Skull base and vertebrae: Multilevel degenerative changes of the spine worse at the C5-C6 level. No acute fracture. No aggressive appearing focal osseous lesion or focal pathologic process. Soft tissues and spinal canal: No prevertebral fluid or swelling. No visible canal hematoma. Disc levels:  Maintained. Upper chest: Unremarkable. Other: None. IMPRESSION: 1. No acute intracranial abnormality. 2. No acute displaced fracture or traumatic listhesis of the cervical spine. Electronically Signed   By: Iven Finn M.D.   On: 02/29/2020 22:31   DG Pelvis Portable  Result Date: 02/29/2020 CLINICAL DATA:  Post fall, found down after several days. Right hip pain. EXAM: PORTABLE PELVIS 1-2 VIEWS COMPARISON:  None. FINDINGS: Oblique area of sclerosis involving the right femoral neck, better seen on concurrent femur exam. No other evidence of pelvic fracture. Pubic symphysis and sacroiliac joints are congruent. Degenerative change of the right greater than left sacroiliac joint. Pubic rami are intact.  IMPRESSION: Oblique area of sclerosis involving the right femoral neck, better seen on concurrent femur exam. This may represent a  nondisplaced fracture versus artifact. Recommend further evaluation with CT or MRI. Electronically Signed   By: Keith Rake M.D.   On: 02/29/2020 22:03   CT Hip Right Wo Contrast  Result Date: 03/01/2020 CLINICAL DATA:  Found down after several days, upper thigh pain EXAM: CT OF THE RIGHT HIP WITHOUT CONTRAST TECHNIQUE: Multidetector CT imaging of the right hip was performed according to the standard protocol. Multiplanar CT image reconstructions were also generated. COMPARISON:  CT 01/14/2019 FINDINGS: Bones/Joint/Cartilage No acute fracture or traumatic malalignment is seen of the right hip or included bony pelvis. The proximal femur is intact with the femoral head normally located within the acetabulum. There is a background moderate right hip osteoarthrosis as well as enthesopathic changes upon the greater trochanter and ischial tuberosity. Additional sclerotic changes are noted at the SI joints, right greater than left, asymmetry of this finding could reflect sequela of prior sacroiliitis though overall the appearance is similar to comparison imaging in 2020. Mild degenerative changes at the pubic symphysis, also stable. No suspicious lytic or blastic lesions. Ligaments Suboptimally assessed by CT. Muscles and Tendons Question some minimal edematous change of the right gluteal musculature possibly related to contusion in the setting of fall though may be accentuated by patient motion artifact towards the superior margins of imaging. No visible intramuscular collection is seen within the limitations of this unenhanced CT. Soft tissues Included portions of the pelvis demonstrate extensive atherosclerotic calcification in the vasculature. Pelvic contents are free of acute abnormality. There is some mild stranding and edematous changes of the right hip, could reflect contusive  change or some more generalized soft tissue edema. No soft tissue gas foreign body. No large hematoma. IMPRESSION: 1. No acute fracture or traumatic malalignment of the right hip or included bony pelvis. 2. Mild soft tissue stranding and edematous changes of the lateral right hip, could reflect contusive change or some more generalized soft tissue edema. Some minimal edematous changes of the right gluteal musculature as well. No large hematoma. No soft tissue gas foreign body. 3. Aortic Atherosclerosis (ICD10-I70.0). Electronically Signed   By: Lovena Le M.D.   On: 03/01/2020 00:50   DG Chest Portable 1 View  Result Date: 02/29/2020 CLINICAL DATA:  Post fall, found down after several days. Rhonchi on the right. EXAM: PORTABLE CHEST 1 VIEW COMPARISON:  10/08/2019 FINDINGS: Low lung volumes. Borderline cardiomegaly, unchanged. Stable mediastinal contours with aortic atherosclerosis. No pulmonary edema. No visualized pneumothorax or pleural effusion. No acute osseous abnormalities are seen. Cervical collar in place. IMPRESSION: Low lung volumes without acute process. Electronically Signed   By: Keith Rake M.D.   On: 02/29/2020 22:00   DG Hip Unilat W or Wo Pelvis 2-3 Views Left  Result Date: 02/29/2020 CLINICAL DATA:  Fall several days ago with pain. EXAM: DG HIP (WITH OR WITHOUT PELVIS) 2-3V LEFT COMPARISON:  None. FINDINGS: Degenerative changes of the right hip with osteophytes. No left hip fracture identified. No dislocation. Vascular calcifications in the femoral arteries bilaterally. IMPRESSION: Degenerative changes. Vascular calcifications in the bilateral femoral arteries. No left hip fracture identified. Electronically Signed   By: Dorise Bullion III M.D   On: 02/29/2020 19:55   DG Femur Min 2 Views Right  Result Date: 02/29/2020 CLINICAL DATA:  Post fall, found down after several days. EXAM: RIGHT FEMUR 2 VIEWS COMPARISON:  None. FINDINGS: Oblique area of sclerosis involving the femoral neck  may represent an impaction fracture versus overlapping osteophytes. Femoral head is well seated in the acetabulum.  More distal femur is intact. Knee alignment is maintained. There are advanced vascular calcifications. IMPRESSION: Oblique area of sclerosis involving the femoral neck may represent an impaction fracture versus overlapping osteophytes. Recommend further evaluation with CT or MRI. Electronically Signed   By: Keith Rake M.D.   On: 02/29/2020 22:02    EKG: Independently reviewed.  Sinus tachycardia, PACs.  T wave inversions in lateral leads similar to prior tracing.  Assessment/Plan Principal Problem:   Rhabdomyolysis Active Problems:   Fall   Lab test positive for detection of COVID-19 virus   Hypotension   Elevated troponin  Acute traumatic rhabdomyolysis: Patient had a fall at home and reportedly on the floor for the past 3 days.  CK 1831.  No signs of AKI. -IV fluid hydration, trend CK  Fall -PT/OT, fall precautions  COVID-19 positive: SARS-CoV-2 PCR test positive.  Unclear whether she is vaccinated.  Not hypoxic.  Chest x-ray not suggestive of pneumonia.  Not febrile or endorsing any respiratory symptoms. -No indication for treatment at this time.  Check inflammatory markers.  Airborne and contact precautions.  Continuous pulse ox.  Hypotension: Likely due to severe dehydration as she has been on the floor for several days. She has very dry mucous membranes on exam.  Blood pressure improved after 1 L fluid bolus in the ED.  Mild lactic acidosis is likely due to severe dehydration.  No fever or leukocytosis to suggest sepsis. -Hold antihypertensives.  Check procalcitonin level.  Troponin elevation: High-sensitivity troponin elevated at 225.  ACS less likely as EKG without acute ischemic changes and patient is denying chest pain.  Etiology of troponin elevation unclear - ?related to rhabdo vs covid infection.  -Cardiac monitoring, trend troponin, echo ordered  Mild  high anion gap metabolic acidosis: Bicarb 20, anion gap 18.  Likely due to mild lactic acidosis.  Salicylate level undetectable. -IV fluid hydration, trend lactate  Neuro deficits, history of CVA: In triage it was felt that patient had left-sided facial droop and left hand weakness.  Neuro exam limited as patient is somnolent and not participating at this time.  Head CT done in the ED negative for acute intracranial abnormality.  ?New stroke versus residual deficits from prior stroke. -Brain MRI ordered  Hyperlipidemia -Hold statin given rhabdomyolysis  Pharmacy med rec pending.  DVT prophylaxis: Lovenox Code Status: Full code Family Communication: No family available this time. Disposition Plan: Status is: Observation  The patient remains OBS appropriate and will d/c before 2 midnights.  Dispo: The patient is from: Home              Anticipated d/c is to: Home              Anticipated d/c date is: 2 days              Patient currently is not medically stable to d/c.  The medical decision making on this patient was of high complexity and the patient is at high risk for clinical deterioration, therefore this is a level 3 visit.  Shela Leff MD [facility]  If 7PM-7AM, please contact night-coverage [email or web address]  03/01/2020, 8:24 AM

## 2020-03-01 NOTE — ED Notes (Signed)
ECHO at bedside.

## 2020-03-01 NOTE — ED Notes (Signed)
Pt returned from MRI, more alert at this time

## 2020-03-01 NOTE — Progress Notes (Signed)
PROGRESS NOTE  [name] UJW:[id number] DOB: 1940/12/24 DOA: 02/29/2020 PCP: Martha Clan, MD  Brief History   80 year old woman reportedly lives alone, PMH stroke with residual left-sided weakness, hypertension, presented to the emergency department after found down alone at home, reportedly on the floor for 3 days.  Noted to be very dry, dehydrated.  Admitted for acute rhabdomyolysis, secondary to prolonged immobility, status post fall; hypotension secondary to dehydration, left-sided weakness noted.  Incidentally found to be Covid positive.  No signs or symptoms of significant infection.  A & P  Acute rhabdomyolysis secondary to fall at home with prolonged immobility for 3 days.  Fortunately no bony injury or other injuries noted. --Trend CK, modest improvement today.  IV fluids.  Check CMP in a.m.  Hold statin.  Fall at home.  No apparent injury.  Reportedly lives alone. --PT, OT consultations  Hypotension in the emergency department, most consistent with severe dehydration.  Mild lactic acidosis probably from severe dehydration.  No fever or leukocytosis, no signs or symptoms of infection or sepsis. --Resolved.  Hold antihypertensives. Stop telemetry --Procalcitonin 0.21, no evidence of infeciton  Elevated anion gap metabolic acidosis likely secondary to lactic acid elevation secondary to dehydration. --BMP today  Troponin elevation, no signs or symptoms to suggest ACS at this point. --Troponins flat.  EKG nonacute, no ischemic changes seen. --Probably related to rhabdomyolysis.  No further evaluation suggested. Stop telemetry.  Left-sided facial droop, left-sided weakness. --Had left-sided weakness status post stroke August 2021.  Suspect at baseline at this point.  Follow-up MRI to exclude new process.  Mild right lower extremity edema --Given elevated D-dimer will check venous Doppler. No history to suggest PE.  COVID-19 positive.  Incidental.  No respiratory  compromise. --No hypoxia.  Chest x-ray clear.  CRP within normal limits.  D-dimer elevated, most likely related to COVID-19.  Asymptomatic, duration unknown, therefore no treatment indicated at this point.  But plan as above.  Disposition Plan:  Discussion: as above  Dispo: The patient is from: Home              Anticipated d/c is to: TBD              Anticipated d/c date is: 2 days              Patient currently is not medically stable to d/c.  DVT prophylaxis: enoxaparin (LOVENOX) injection 40 mg Start: 03/01/20 0800   Code Status: Full Code Family Communication: will try to contact later today  Brendia Sacks, MD  [facility] Direct contact: see [email or web address] (further directions at bottom of note if needed) 7PM-7AM contact night coverage as at bottom of note 03/01/2020, 1:37 PM  LOS: 0 days   Significant Hospital Events   .    Consults:  .    Procedures:  .   Significant Diagnostic Tests:  Marland Kitchen    Micro Data:  .    Antimicrobials:  .   Interval History/Subjective  CC: f/u fall  Feels ok, no pain, no complaints. Breathing fine.  Objective   Vitals:  Vitals:   03/01/20 1100 03/01/20 1336  BP: 135/84 (!) 143/118  Pulse: 87 87  Resp: 19 19  Temp:  98.6 F (37 C)  SpO2: 99% 98%    Exam:  Constitutional:   . Appears calm and comfortable ENMT:  . grossly normal hearing  Respiratory:  . CTA bilaterally, no w/r/r.  . Respiratory effort normal. . On RA Cardiovascular:  . RRR, no  m/r/g . Mild RLE extremity edema   Abdomen:  . Soft, ntnd Musculoskeletal:  . RUE, LUE, RLE, LLE   . Grossly normal tone, mild decreased strength LLE Neurologic:  . Left facial weakness noted Psychiatric:  . Mental status o Mood, affect appropriate o Oriented to self, location, month, year w/ brisk responses  I have personally reviewed the following:   Today's Data  . CBG stable . AST and Tbili elevated on admit likely rhabdomyolysis related . CK 1831 >  1236 . Troponin 225 > 146 . CRP 8.8 . Lactic acid has normalized at 1.8 . Hemoglobin was 15.9 consistent with hemoconcentration, was not normal in August 2021  Scheduled Meds: . enoxaparin (LOVENOX) injection  40 mg Subcutaneous Q24H   Continuous Infusions: . sodium chloride 150 mL/hr at 03/01/20 1336    Principal Problem:   Rhabdomyolysis Active Problems:   Fall   Lab test positive for detection of COVID-19 virus   Hypotension   Elevated troponin   Left leg weakness   Leg edema, right   LOS: 0 days   How to contact the [facility] Attending or Consulting provider 7A - 7P or covering provider during after hours 7P -7A, for this patient?  1. Check the care team in [facility] and look for a) attending/consulting TRH provider listed and b) the [facility] team listed 2. Log into [email or web address] and use [place]'s universal password to access. If you do not have the password, please contact the hospital operator. 3. Locate the [facility] provider you are looking for under [facility] and page to a number that you can be directly reached. 4. If you still have difficulty reaching the provider, please page the [facility] (Director on Call) for the Hospitalists listed on amion for assistance.

## 2020-03-01 NOTE — ED Notes (Signed)
Vascular at bedside
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
## 2020-03-01 NOTE — Progress Notes (Signed)
  Echocardiogram 2D Echocardiogram has been performed.  Gerda Diss 03/01/2020, 2:22 PM

## 2020-03-02 ENCOUNTER — Inpatient Hospital Stay (HOSPITAL_COMMUNITY): Payer: Medicare Other

## 2020-03-02 ENCOUNTER — Encounter (HOSPITAL_COMMUNITY): Payer: Self-pay | Admitting: Family Medicine

## 2020-03-02 ENCOUNTER — Other Ambulatory Visit: Payer: Self-pay

## 2020-03-02 DIAGNOSIS — R7989 Other specified abnormal findings of blood chemistry: Secondary | ICD-10-CM

## 2020-03-02 LAB — COMPREHENSIVE METABOLIC PANEL
ALT: 13 U/L (ref 0–44)
AST: 45 U/L — ABNORMAL HIGH (ref 15–41)
Albumin: 3.6 g/dL (ref 3.5–5.0)
Alkaline Phosphatase: 38 U/L (ref 38–126)
Anion gap: 11 (ref 5–15)
BUN: 30 mg/dL — ABNORMAL HIGH (ref 8–23)
CO2: 24 mmol/L (ref 22–32)
Calcium: 9 mg/dL (ref 8.9–10.3)
Chloride: 102 mmol/L (ref 98–111)
Creatinine, Ser: 1.41 mg/dL — ABNORMAL HIGH (ref 0.44–1.00)
GFR, Estimated: 38 mL/min — ABNORMAL LOW (ref >60)
Glucose, Bld: 93 mg/dL (ref 70–99)
Potassium: 3.6 mmol/L (ref 3.5–5.1)
Sodium: 137 mmol/L (ref 135–145)
Total Bilirubin: 1 mg/dL (ref 0.3–1.2)
Total Protein: 6.3 g/dL — ABNORMAL LOW (ref 6.5–8.1)

## 2020-03-02 LAB — URINE CULTURE: Culture: NO GROWTH

## 2020-03-02 LAB — CK: Total CK: 943 U/L — ABNORMAL HIGH (ref 38–234)

## 2020-03-02 LAB — CBC
HCT: 43.4 % (ref 36.0–46.0)
Hemoglobin: 13 g/dL (ref 12.0–15.0)
MCH: 24.7 pg — ABNORMAL LOW (ref 26.0–34.0)
MCHC: 30 g/dL (ref 30.0–36.0)
MCV: 82.4 fL (ref 80.0–100.0)
Platelets: 241 10*3/uL (ref 150–400)
RBC: 5.27 MIL/uL — ABNORMAL HIGH (ref 3.87–5.11)
RDW: 14.3 % (ref 11.5–15.5)
WBC: 4.5 10*3/uL (ref 4.0–10.5)
nRBC: 0 % (ref 0.0–0.2)

## 2020-03-02 MED ORDER — ALBUTEROL SULFATE HFA 108 (90 BASE) MCG/ACT IN AERS
2.0000 | INHALATION_SPRAY | Freq: Four times a day (QID) | RESPIRATORY_TRACT | Status: DC | PRN
  Filled 2020-03-02: qty 6.7

## 2020-03-02 MED ORDER — BUPROPION HCL ER (XL) 150 MG PO TB24
150.0000 mg | ORAL_TABLET | Freq: Every morning | ORAL | Status: DC
  Administered 2020-03-03 – 2020-03-06 (×4): 150 mg via ORAL
  Filled 2020-03-02 (×4): qty 1

## 2020-03-02 MED ORDER — SODIUM CHLORIDE 0.9 % IV SOLN: INTRAVENOUS | Status: DC

## 2020-03-02 MED ORDER — CLOPIDOGREL BISULFATE 75 MG PO TABS
75.0000 mg | ORAL_TABLET | Freq: Every day | ORAL | Status: DC
Start: 2020-03-02 — End: 2020-03-06
  Administered 2020-03-02 – 2020-03-06 (×5): 75 mg via ORAL
  Filled 2020-03-02 (×5): qty 1

## 2020-03-02 NOTE — Progress Notes (Signed)
Bilateral lower extremity venous duplex has been completed. Preliminary results can be found in CV Proc through chart review.   03/02/20 12:34 PM Olen Cordial RVT

## 2020-03-02 NOTE — NC FL2 (Signed)
[place] MEDICAID FL2 LEVEL OF CARE SCREENING TOOL     IDENTIFICATION  Patient Name: [name] Birthdate: Jul 05, 1940 Sex: female Admission Date (Current Location): 02/29/2020  [facility] and [place] Number:  Producer, television/film/video and Address:  [facility] [place], [address], [place], [place] [address]      Provider Number: [id number]  Attending Physician Name and Address:  Standley Brooking, MD  Relative Name and Phone Number:  [name] [phone]    Current Level of Care: Hospital Recommended Level of Care: Skilled Nursing Facility Prior Approval Number:    Date Approved/Denied:   PASRR Number: [id number]  Discharge Plan: SNF    Current Diagnoses: Patient Active Problem List   Diagnosis Date Noted  . Rhabdomyolysis 03/01/2020  . Fall 03/01/2020  . Lab test positive for detection of COVID-19 virus 03/01/2020  . Hypotension 03/01/2020  . Elevated troponin 03/01/2020  . Left leg weakness 03/01/2020  . Leg edema, right 03/01/2020  . Acute ischemic stroke (HCC) 10/08/2019  . Status post left rotator cuff repair 02/13/2018  . Complete rotator cuff tear of left shoulder 01/27/2018  . S/P left rotator cuff repair 01/27/2018  . PVD (peripheral vascular disease) with claudication (HCC) 05/24/2012  . Peripheral vascular disease, unspecified (HCC) 11/24/2011  . HYPERCHOLESTEROLEMIA 04/21/2006  . OBESITY, NOS 04/21/2006  . DEPRESSION, MAJOR, RECURRENT 04/21/2006  . TOBACCO DEPENDENCE 04/21/2006  . HYPERTENSION, BENIGN SYSTEMIC 04/21/2006  . HAND PAIN 04/21/2006    Orientation RESPIRATION BLADDER Height & Weight     Self,Time,Situation,Place  Normal Continent Weight:   Height:     BEHAVIORAL SYMPTOMS/MOOD NEUROLOGICAL BOWEL NUTRITION STATUS      Incontinent Diet (See discharge Summary)  AMBULATORY STATUS COMMUNICATION OF NEEDS Skin   Limited Assist Verbally Normal                       Personal Care Assistance Level of Assistance   Bathing,Feeding,Dressing Bathing Assistance: Limited assistance   Dressing Assistance: Limited assistance     Functional Limitations Info  Sight,Hearing,Speech Sight Info: Adequate Hearing Info: Impaired Speech Info: Adequate    SPECIAL CARE FACTORS FREQUENCY  PT (By licensed PT),OT (By licensed OT)     PT Frequency: 5x min weekly OT Frequency: 5x min weekly            Contractures Contractures Info: Not present    Additional Factors Info  Code Status,Psychotropic Code Status Info: FULL   Psychotropic Info: buPROPion (WELLBUTRIN XL) 24 hr tablet 150 mg every morning         Current Medications (03/02/2020):  This is the current hospital active medication list Current Facility-Administered Medications  Medication Dose Route Frequency Provider Last Rate Last Admin  . 0.9 %  sodium chloride infusion   Intravenous Continuous Standley Brooking, MD 150 mL/hr at 03/02/20 0923 New Bag at 03/02/20 0923  . acetaminophen (TYLENOL) tablet 650 mg  650 mg Oral Q6H PRN John Giovanni, MD      . albuterol (VENTOLIN HFA) 108 (90 Base) MCG/ACT inhaler 2 puff  2 puff Inhalation Q6H PRN Standley Brooking, MD      . buPROPion (WELLBUTRIN XL) 24 hr tablet 150 mg  150 mg Oral q morning - 10a Standley Brooking, MD      . clopidogrel (PLAVIX) tablet 75 mg  75 mg Oral Daily Standley Brooking, MD   75 mg at 03/02/20 1529  . enoxaparin (LOVENOX) injection 40 mg  40  mg Subcutaneous Q24H John Giovanni, MD   40 mg at 03/02/20 8563     Discharge Medications: Please see discharge summary for a list of discharge medications.  Relevant Imaging Results:  Relevant Lab Results:   Additional Information [id number]  Terrial Rhodes, LCSWA

## 2020-03-02 NOTE — Progress Notes (Addendum)
PROGRESS NOTE  [name] BTY:[id number] DOB: [birth year] DOA: 02/29/2020 PCP: Martha Clan, MD  Brief History   80 year old woman reportedly lives alone, PMH stroke with residual left-sided weakness, hypertension, presented to the emergency department after found down alone at home, reportedly on the floor for 3 days.  Noted to be very dry, dehydrated.  Admitted for acute rhabdomyolysis, secondary to prolonged immobility, status post fall; hypotension secondary to dehydration, left-sided weakness noted.  Incidentally found to be Covid positive.  No signs or symptoms of significant infection.  A & P  Acute rhabdomyolysis secondary to fall at home with prolonged immobility for 3 days.  Fortunately no bony injury or other injuries noted. --CK slightly improved, continue IV fluids, hold statin. --Labs in a.m.  Fall at home.  No apparent injury.  Reportedly lives alone. --PT, OT consultation.  PT recommended SNF.  Hypotension in the emergency department, most consistent with severe dehydration.  Mild lactic acidosis probably from severe dehydration.  No fever or leukocytosis, no signs or symptoms of infection or sepsis. --Remained stable.--Procalcitonin 0.21, no evidence of infeciton  Elevated anion gap metabolic acidosis likely secondary to lactic acid elevation secondary to dehydration. --Resolved.  Troponin elevation, no signs or symptoms to suggest ACS at this point. --Troponins flat.  EKG nonacute, no ischemic changes seen. --Probably related to rhabdomyolysis.  No further evaluation suggested.   Left-sided facial droop, left-sided weakness. --Had left-sided weakness status post stroke August 2021.  Appears to be at baseline at this point.  MRI negative for acute process.  Mild right lower extremity edema --Given elevated D-dimer, check venous Doppler. No history to suggest PE.  COVID-19 positive.  Incidental.  No respiratory compromise. --Asymptomatic, chest x-ray clear, CRP  unremarkable.  Elevated D-dimer probably from Covid.  Supportive care.  Disposition Plan:  Discussion: as above  Dispo: The patient is from: Home              Anticipated d/c is to: TBD              Anticipated d/c date is: 2 days              Patient currently is not medically stable to d/c.  DVT prophylaxis: enoxaparin (LOVENOX) injection 40 mg Start: 03/01/20 0800   Code Status: Full Code Family Communication: will try to contact later today  Brendia Sacks, MD  [facility] Direct contact: see [email or web address] (further directions at bottom of note if needed) 7PM-7AM contact night coverage as at bottom of note 03/02/2020, 10:03 AM  LOS: 1 day   Significant Hospital Events   .    Consults:  .    Procedures:  .   Significant Diagnostic Tests:  Marland Kitchen    Micro Data:  .    Antimicrobials:  .   Interval History/Subjective  CC: f/u fall  No issues overnight C/o pain all over Breathing ok  Objective   Vitals:  Vitals:   03/02/20 0300 03/02/20 0654  BP: 131/74 130/74  Pulse: 86 85  Resp: 19 18  Temp:  98.6 F (37 C)  SpO2: 96% 95%    Exam: Constitutional:   . Appears calm and comfortable ENMT:  . Slightly hard of hearing Respiratory:  . CTA bilaterally, no w/r/r.  . Respiratory effort normal. Cardiovascular:  . RRR, no m/r/g . No significant LE extremity edema   Musculoskeletal:  . RUE, LUE, RLE, LLE   . Moves all extremities Psychiatric:  . Mental status o Mood, affect appropriate  I have personally reviewed the following:   Today's Data  . Creatinine no significant change 1.41 . CK slightly down . CBC stable  Scheduled Meds: . buPROPion  150 mg Oral q morning - 10a  . clopidogrel  75 mg Oral Daily  . enoxaparin (LOVENOX) injection  40 mg Subcutaneous Q24H   Continuous Infusions: . sodium chloride 150 mL/hr at 03/02/20 7673    Principal Problem:   Rhabdomyolysis Active Problems:   Fall   Lab test positive for detection of COVID-19  virus   Hypotension   Elevated troponin   Left leg weakness   Leg edema, right   LOS: 1 day   How to contact the [facility] Attending or Consulting provider 7A - 7P or covering provider during after hours 7P -7A, for this patient?  1. Check the care team in [facility] and look for a) attending/consulting TRH provider listed and b) the [facility] team listed 2. Log into [email or web address] and use [place]'s universal password to access. If you do not have the password, please contact the hospital operator. 3. Locate the [facility] provider you are looking for under [facility] and page to a number that you can be directly reached. 4. If you still have difficulty reaching the provider, please page the [facility] (Director on Call) for the Hospitalists listed on amion for assistance.

## 2020-03-02 NOTE — ED Notes (Signed)
Patient resting in bed, no other issues noted.  Patient to be transported upstairs with all belongings.

## 2020-03-02 NOTE — Progress Notes (Signed)
Patient does not know her home medication list, patient states there is no one who may have this information. Spoke with patients sister she did not have any insight on med list as well.

## 2020-03-02 NOTE — TOC Initial Note (Signed)
Transition of Care [facility]) - Initial/Assessment Note    Patient Details  Name: [name] MRN: [id number] Date of Birth: [birth year]  Transition of Care [facility]) CM/SW Contact:    Terrial Rhodes, LCSWA Phone Number: 03/02/2020, 3:47 PM  Clinical Narrative:                  CSW received consult for possible SNF placement at time of discharge. CSW spoke with patient regarding PT recommendation of SNF placement at time of discharge. Patient comes from home alone.Patient expressed understanding of PT recommendation and is agreeable to SNF placement at time of discharge. Patient agreed for CSW to fax out initial referral near [place] area.  Patient has received COVID vaccines. No further questions reported at this time. CSW to continue to follow and assist with discharge planning needs.   Expected Discharge Plan: Skilled Nursing Facility Barriers to Discharge: Continued Medical Work up   Patient Goals and CMS Choice Patient states their goals for this hospitalization and ongoing recovery are:: to go to SNF CMS Medicare.gov Compare Post Acute Care list provided to:: Patient Choice offered to / list presented to : Patient  Expected Discharge Plan and Services Expected Discharge Plan: Skilled Nursing Facility       Living arrangements for the past 2 months: Single Family Home                                      Prior Living Arrangements/Services Living arrangements for the past 2 months: Single Family Home Lives with:: Self Patient language and need for interpreter reviewed:: Yes Do you feel safe going back to the place where you live?: No   SNF  Need for Family Participation in Patient Care: Yes (Comment) Care giver support system in place?: Yes (comment)   Criminal Activity/Legal Involvement Pertinent to Current Situation/Hospitalization: No - Comment as needed  Activities of Daily Living      Permission Sought/Granted Permission sought to share information with :  Case Manager,Family Electrical engineer Permission granted to share information with : Yes, Verbal Permission Granted  Share Information with NAME: [name]  Permission granted to share info w AGENCY: SNF  Permission granted to share info w Relationship: sister  Permission granted to share info w Contact Information: [name] [phone]  Emotional Assessment   Attitude/Demeanor/Rapport: Gracious Affect (typically observed): Calm Orientation: : Oriented to Self,Oriented to Place,Oriented to  Time,Oriented to Situation Alcohol / Substance Use: Not Applicable Psych Involvement: No (comment)  Admission diagnosis:  Rhabdomyolysis [M62.82] Fall, initial encounter [W19.XXXA] Patient Active Problem List   Diagnosis Date Noted  . Rhabdomyolysis 03/01/2020  . Fall 03/01/2020  . Lab test positive for detection of COVID-19 virus 03/01/2020  . Hypotension 03/01/2020  . Elevated troponin 03/01/2020  . Left leg weakness 03/01/2020  . Leg edema, right 03/01/2020  . Acute ischemic stroke (HCC) 10/08/2019  . Status post left rotator cuff repair 02/13/2018  . Complete rotator cuff tear of left shoulder 01/27/2018  . S/P left rotator cuff repair 01/27/2018  . PVD (peripheral vascular disease) with claudication (HCC) 05/24/2012  . Peripheral vascular disease, unspecified (HCC) 11/24/2011  . HYPERCHOLESTEROLEMIA 04/21/2006  . OBESITY, NOS 04/21/2006  . DEPRESSION, MAJOR, RECURRENT 04/21/2006  . TOBACCO DEPENDENCE 04/21/2006  . HYPERTENSION, BENIGN SYSTEMIC 04/21/2006  . HAND PAIN 04/21/2006   PCP:  Martha Clan, MD Pharmacy:   [facility] [phone] - [place], [place] - [address] AT [facility] [address] [place] [address] Phone: [phone] Fax: [phone]     Social Determinants of Health (SDOH) Interventions    Readmission Risk Interventions Readmission Risk Prevention Plan 10/09/2019  Post Dischage Appt Not  Complete  Appt Comments rec for SNF  Medication Screening Complete  Transportation Screening Complete  Some recent data might be hidden

## 2020-03-03 LAB — COMPREHENSIVE METABOLIC PANEL
ALT: 11 U/L (ref 0–44)
AST: 34 U/L (ref 15–41)
Albumin: 3.2 g/dL — ABNORMAL LOW (ref 3.5–5.0)
Alkaline Phosphatase: 35 U/L — ABNORMAL LOW (ref 38–126)
Anion gap: 8 (ref 5–15)
BUN: 22 mg/dL (ref 8–23)
CO2: 24 mmol/L (ref 22–32)
Calcium: 8.4 mg/dL — ABNORMAL LOW (ref 8.9–10.3)
Chloride: 107 mmol/L (ref 98–111)
Creatinine, Ser: 1.18 mg/dL — ABNORMAL HIGH (ref 0.44–1.00)
GFR, Estimated: 48 mL/min — ABNORMAL LOW (ref >60)
Glucose, Bld: 101 mg/dL — ABNORMAL HIGH (ref 70–99)
Potassium: 3.5 mmol/L (ref 3.5–5.1)
Sodium: 139 mmol/L (ref 135–145)
Total Bilirubin: 0.7 mg/dL (ref 0.3–1.2)
Total Protein: 5.5 g/dL — ABNORMAL LOW (ref 6.5–8.1)

## 2020-03-03 LAB — CK: Total CK: 655 U/L — ABNORMAL HIGH (ref 38–234)

## 2020-03-03 NOTE — Progress Notes (Signed)
Pt c/o pain in right leg from knee to ankle, slightly warm to touch, no edema noted, cramping in nature per pt. Negative Venous US of LE completed 1/9,  tylenol given. Endorsed to day shift Charity fundraiser. In addtion IV in RUA appears to be infiltrated, fluids stopped and IV team consulted.
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
## 2020-03-03 NOTE — Progress Notes (Signed)
PROGRESS NOTE  RAYMOND AZURE HKV:[id number] DOB: [birth year] DOA: 02/29/2020 PCP: Martha Clan, MD  Brief History   80 year old woman reportedly lives alone, PMH stroke with residual left-sided weakness, hypertension, presented to the emergency department after found down alone at home, reportedly on the floor for 3 days.  Noted to be very dry, dehydrated.  Admitted for acute rhabdomyolysis, secondary to prolonged immobility, status post fall; hypotension secondary to dehydration, left-sided weakness noted.  Incidentally found to be Covid positive.  Clinically improved, at this point appears medically stable for transfer to SNF.  A & P  Acute rhabdomyolysis secondary to fall at home with prolonged immobility for 3 days.  Fortunately no bony injury or other injuries noted. --Continues to improve, expect spontaneous resolution at this point.  Hold statin.  Fall at home.  No apparent injury.  Reportedly lives alone. --PT, OT recommended SNF.  Troponin elevation, no signs or symptoms to suggest ACS at this point. --Troponins flat.  EKG nonacute, no ischemic changes seen. --Probably related to rhabdomyolysis.  No further evaluation.   Left-sided facial droop, left-sided weakness. --Had left-sided weakness status post stroke August 2021.  Appears to be at baseline at this point.  MRI negative for acute process.  Mild right lower extremity edema --Given elevated D-dimer, checked venous Doppler. No history to suggest PE.  Doppler was negative.  No further evaluation suggested.  COVID-19 positive.  Incidental.  No respiratory compromise. --Asymptomatic, chest x-ray was clear, CRP was unremarkable.  Elevated D-dimer probably from Covid.  Continue supportive care.  Hypotension in the emergency department, most consistent with severe dehydration.  Mild lactic acidosis probably from severe dehydration.  No fever or leukocytosis, no signs or symptoms of infection or sepsis. --Resolved --Procalcitonin  0.21, no evidence of infeciton  Elevated anion gap metabolic acidosis likely secondary to lactic acid elevation secondary to dehydration. --Resolved.  Disposition Plan:  Discussion: Doing well at this point, stop IV fluids, continue supportive care, patient medically stable for transfer to SNF.  Dispo: The patient is from: Home              Anticipated d/c is to: SNF              Anticipated d/c date is: 1 day              Patient currently is medically stable to d/c.  DVT prophylaxis: enoxaparin (LOVENOX) injection 40 mg Start: 03/01/20 0800   Code Status: Full Code Family Communication: pt requested I call no one  Brendia Sacks, MD  [facility] Direct contact: see [email or web address] (further directions at bottom of note if needed) 7PM-7AM contact night coverage as at bottom of note 03/03/2020, 5:39 PM  LOS: 2 days   Significant Hospital Events   .    Consults:  .    Procedures:  .   Significant Diagnostic Tests:  Marland Kitchen    Micro Data:  .    Antimicrobials:  .   Interval History/Subjective  CC: f/u fall  Feels okay, breathing okay  Objective   Vitals:  Vitals:   03/03/20 0821 03/03/20 1717  BP: 135/90 (!) 159/105  Pulse:  79  Resp: 20 18  Temp: 98.1 F (36.7 C) 98.4 F (36.9 C)  SpO2:  95%    Exam: Constitutional:   . Appears calm and comfortable ENMT:  . grossly normal hearing  Respiratory:  . CTA bilaterally, no w/r/r.  . Respiratory effort normal.  Cardiovascular:  . RRR, no m/r/g . No  LE extremity edema   Musculoskeletal:  . RLE, LLE   Moves both legs to command Psychiatric:  . Mental status o Mood, affect appropriate o Oriented to hospital  I have personally reviewed the following:   Today's Data  . Creatinine near normal, trending down AST has normalized, CK trending down.  Scheduled Meds: . buPROPion  150 mg Oral q morning - 10a  . clopidogrel  75 mg Oral Daily  . enoxaparin (LOVENOX) injection  40 mg Subcutaneous Q24H    Continuous Infusions:   Principal Problem:   Rhabdomyolysis Active Problems:   Fall   Lab test positive for detection of COVID-19 virus   Hypotension   Elevated troponin   Left leg weakness   Leg edema, right   LOS: 2 days   How to contact the [facility] Attending or Consulting provider 7A - 7P or covering provider during after hours 7P -7A, for this patient?  1. Check the care team in [facility] and look for a) attending/consulting TRH provider listed and b) the [facility] team listed 2. Log into [email or web address] and use [place]'s universal password to access. If you do not have the password, please contact the hospital operator. 3. Locate the [facility] provider you are looking for under [facility] and page to a number that you can be directly reached. 4. If you still have difficulty reaching the provider, please page the [facility] (Director on Call) for the Hospitalists listed on amion for assistance.

## 2020-03-03 NOTE — Telephone Encounter (Signed)
POC signed and faxed to [facility].  OK transmission received.

## 2020-03-04 LAB — GLUCOSE, CAPILLARY: Glucose-Capillary: 86 mg/dL (ref 70–99)

## 2020-03-04 MED ORDER — EZETIMIBE 10 MG PO TABS
10.0000 mg | ORAL_TABLET | Freq: Every day | ORAL | Status: DC
  Administered 2020-03-04 – 2020-03-06 (×3): 10 mg via ORAL
  Filled 2020-03-04 (×3): qty 1

## 2020-03-04 MED ORDER — AMLODIPINE BESYLATE 10 MG PO TABS
10.0000 mg | ORAL_TABLET | Freq: Every day | ORAL | Status: DC
  Administered 2020-03-04 – 2020-03-06 (×3): 10 mg via ORAL
  Filled 2020-03-04: qty 1
  Filled 2020-03-04: qty 2
  Filled 2020-03-04: qty 1

## 2020-03-04 MED ORDER — HYDROCHLOROTHIAZIDE 25 MG PO TABS: 25.0000 mg | ORAL_TABLET | Freq: Every day | ORAL | Status: DC

## 2020-03-04 NOTE — Plan of Care (Signed)
  Problem: Education: Goal: Knowledge of risk factors and measures for prevention of condition will improve Outcome: Progressing   Problem: Coping: Goal: Psychosocial and spiritual needs will be supported Outcome: Progressing   Problem: Respiratory: Goal: Will maintain a patent airway Outcome: Progressing Goal: Complications related to the disease process, condition or treatment will be avoided or minimized Outcome: Progressing   

## 2020-03-04 NOTE — TOC Progression Note (Addendum)
Transition of Care [facility]) - Progression Note    Patient Details  Name: [name] MRN: [id number] Date of Birth: 03-16-1940  Transition of Care [facility]) CM/SW Contact  [name], LCSW Phone Number: 03/04/2020, 10:58 AM  Clinical Narrative:    [name] will notify CSW once COVID bed opens. No beds at [facility].   CSW updated patient's sister, [name], and she is in agreement with the plan.   Expected Discharge Plan: Skilled Nursing Facility Barriers to Discharge: Continued Medical Work up  Expected Discharge Plan and Services Expected Discharge Plan: Skilled Nursing Facility       Living arrangements for the past 2 months: Single Family Home                                       Social Determinants of Health (SDOH) Interventions    Readmission Risk Interventions Readmission Risk Prevention Plan 10/09/2019  Post Dischage Appt Not Complete  Appt Comments rec for SNF  Medication Screening Complete  Transportation Screening Complete  Some recent data might be hidden

## 2020-03-04 NOTE — Progress Notes (Signed)
Physical Therapy Treatment Patient Details Name: [name] MRN: [id number] DOB: 10-12-1940 Today's Date: 03/04/2020    History of Present Illness [name] is a 80 y.o. female admitted 02/29/20 after found down by family after suspected fall three days prior. BLE imaging negative for acute injury. Brain MRI negative for acute abnormality; multiple chronic infarcts. Workup for rhabdomyolysis. Incidental (+) COVID-19. PMH includes HTN, stroke (1994, 09/2019), depression.   [name] Comments    [name] slowly progressing with mobility. Today's session focused on transfer training with RW, [name] requiring consistent modA to stand. [name] unaware of bladder/bowel incontinence requiring assist for pericare/washup. [name] remains limited by generalized weakness, decreased activity tolerance cognitive impairment, including poor attention, decreased awareness and difficulty problem solving; at high risk for falls. Continue to recommend SNF-level therapies to maximize functional mobility and independence prior to return home.    Follow Up Recommendations  SNF;Supervision/Assistance - 24 hour     Equipment Recommendations  None recommended by [name]    Recommendations for Other Services       Precautions / Restrictions Precautions Precautions: Fall;Other (comment) Precaution Comments: Bladder/bowel incontinence Restrictions Weight Bearing Restrictions: No    Mobility  Bed Mobility Overal bed mobility: Needs Assistance Bed Mobility: Supine to Sit     Supine to sit: Min assist     General bed mobility comments: Cues to attend to task as [name] easily distracted; minA for trunk elevation; increased time and effort to scoot to EOB with cues  Transfers Overall transfer level: Needs assistance Equipment used: Rolling walker (2 wheeled) Transfers: Sit to/from Stand Sit to Stand: Mod assist         General transfer comment: Multiple sit<>stands from bed and recliner to RW with consistent modA for trunk elevation; increased  time and effort to achieve fully upright standing; repeated cues for hand placement and sequencing; [name] unaware of bladder/bowel incontinence upon standing  Ambulation/Gait Ambulation/Gait assistance: Min assist Gait Distance (Feet): 2 Feet Assistive device: Rolling walker (2 wheeled) Gait Pattern/deviations: Step-to pattern;Trunk flexed Gait velocity: Decreased   General Gait Details: Slow steps to recliner with RW and minA for stability; further distance limited by incontinence and fatigue after prolonged standing for washup   Stairs             Wheelchair Mobility    Modified Rankin (Stroke Patients Only)       Balance Overall balance assessment: Needs assistance;History of Falls Sitting-balance support: No upper extremity supported;Feet unsupported Sitting balance-Leahy Scale: Fair     Standing balance support: Single extremity supported;Bilateral upper extremity supported;During functional activity Standing balance-Leahy Scale: Poor Standing balance comment: [name] reliant on at least single UE support to maintain standing balance while performing pericare with other UE; assist to complete task, dependent for posterior pericare/washup                            Cognition Arousal/Alertness: Awake/alert Behavior During Therapy: Flat affect Overall Cognitive Status: No family/caregiver present to determine baseline cognitive functioning Area of Impairment: Attention;Following commands;Awareness;Problem solving                   Current Attention Level: Sustained;Selective   Following Commands: Follows one step commands with increased time   Awareness: Emergent Problem Solving: Requires verbal cues;Slow processing        Exercises      General Comments        Pertinent Vitals/Pain Pain Assessment: No/denies pain  Home Living                      Prior Function            [name] Goals (current goals can now be found in the  care plan section) Progress towards [name] goals: Progressing toward goals    Frequency    Min 2X/week      [name] Plan Current plan remains appropriate    Co-evaluation              AM-PAC [name] "6 Clicks" Mobility   Outcome Measure  Help needed turning from your back to your side while in a flat bed without using bedrails?: A Little Help needed moving from lying on your back to sitting on the side of a flat bed without using bedrails?: A Little Help needed moving to and from a bed to a chair (including a wheelchair)?: A Lot Help needed standing up from a chair using your arms (e.g., wheelchair or bedside chair)?: A Lot Help needed to walk in hospital room?: A Lot Help needed climbing 3-5 steps with a railing? : A Lot 6 Click Score: 14    End of Session Equipment Utilized During Treatment: Gait belt Activity Tolerance: Patient tolerated treatment well;Patient limited by fatigue Patient left: in chair;with call bell/phone within reach;with chair alarm set Nurse Communication: Mobility status [name] Visit Diagnosis: Muscle weakness (generalized) (M62.81);Other abnormalities of gait and mobility (R26.89)     Time: 0626-9485 [name] Time Calculation (min) (ACUTE ONLY): 28 min  Charges:  $Therapeutic Activity: 23-37 mins                    [name], [name], [name] Acute Rehabilitation Services  Pager [phone] Office [phone]  Malachy Chamber 03/04/2020, 5:17 PM

## 2020-03-04 NOTE — Progress Notes (Signed)
PROGRESS NOTE  [name] ZJQ:[id number] DOB: 1940/11/10 DOA: 02/29/2020 PCP: Martha Clan, MD  Brief History   80 year old woman reportedly lives alone, PMH stroke with residual left-sided weakness, hypertension, presented to the emergency department after found down alone at home, reportedly on the floor for 3 days.  Noted to be very dry, dehydrated.  Admitted for acute rhabdomyolysis, secondary to prolonged immobility, status post fall; hypotension secondary to dehydration, left-sided weakness noted.  Incidentally found to be Covid positive.  Rhabdomyolysis resolved with appropriate treatment as did hypotension and dehydration.  Left-sided weakness chronic, MRI unrevealing.  PT recommended SNF.  Medically stable, awaiting bed.  A & P  Acute rhabdomyolysis secondary to fall at home with prolonged immobility for 3 days.  Fortunately no bony injury or other injuries noted. -- Resolved.  Continue to hold statin.  Fall at home.  No apparent injury.  Reportedly lives alone. --PT, OT recommended SNF.  Troponin elevation, no signs or symptoms to suggest ACS at this point. --Troponins flat.  EKG nonacute, no ischemic changes seen. --Probably related to rhabdomyolysis.  No further evaluation suggested.   Left-sided facial droop, left-sided weakness; PMH stroke w/ left-sided weakness. --Had left-sided weakness status post stroke August 2021.  Appears to be at baseline at this point.  MRI negative for acute process. --continue Plavix, can resume statin on discharge. Amlodipine.  COVID-19 positive.  Incidental.  No respiratory compromise. -- Remains asymptomatic, chest x-ray was clear, CRP was unremarkable.  Elevated D-dimer probably from Covid.  Continue supportive care.  Hypotension in the emergency department, most consistent with severe dehydration.  Mild lactic acidosis probably from severe dehydration.  No fever or leukocytosis, no signs or symptoms of infection or  sepsis. --Resolved --Procalcitonin 0.21, no evidence of infeciton  Elevated anion gap metabolic acidosis likely secondary to lactic acid elevation secondary to dehydration. --Resolved.   Mild right lower extremity edema --Given elevated D-dimer, checked venous Doppler. No history to suggest PE.  Doppler was negative.  No further evaluation suggested.  Disposition Plan:  Discussion: Remains medically stable for transfer to SNF.  Dispo: The patient is from: Home              Anticipated d/c is to: SNF              Anticipated d/c date is: 1 day              Patient currently is medically stable to d/c.  DVT prophylaxis: enoxaparin (LOVENOX) injection 40 mg Start: 03/01/20 0800   Code Status: Full Code Family Communication: pt requested I call no one  Brendia Sacks, MD  [facility] Direct contact: see [email or web address] (further directions at bottom of note if needed) 7PM-7AM contact night coverage as at bottom of note 03/04/2020, 6:59 PM  LOS: 3 days   Interval History/Subjective  CC: f/u fall  Does not like the food here.  Otherwise feeling fine.  Objective   Vitals:  Vitals:   03/04/20 1000 03/04/20 1410  BP:  (!) 153/81  Pulse:  83  Resp:  20  Temp:  98.2 F (36.8 C)  SpO2: 99% 100%    Exam: Constitutional:   . Appears calm and comfortable ENMT:  . grossly normal hearing  Psychiatric:  . Mental status o Mood, affect appropriate  I have personally reviewed the following:   Today's Data  . CBG stable  Scheduled Meds: . amLODipine  10 mg Oral Daily  . buPROPion  150 mg Oral q morning -  10a  . clopidogrel  75 mg Oral Daily  . enoxaparin (LOVENOX) injection  40 mg Subcutaneous Q24H  . ezetimibe  10 mg Oral Daily   Continuous Infusions:   Principal Problem:   Rhabdomyolysis Active Problems:   Fall   Lab test positive for detection of COVID-19 virus   Hypotension   Elevated troponin   Left leg weakness   Leg edema, right   LOS: 3 days   How  to contact the [facility] Attending or Consulting provider 7A - 7P or covering provider during after hours 7P -7A, for this patient?  1. Check the care team in [facility] and look for a) attending/consulting TRH provider listed and b) the [facility], The team listed 2. Log into [email or web address] and use [place]'s universal password to access. If you do not have the password, please contact the hospital operator. 3. Locate the [facility] provider you are looking for under [facility] and page to a number that you can be directly reached. 4. If you still have difficulty reaching the provider, please page the [facility] (Director on Call) for the Hospitalists listed on amion for assistance.

## 2020-03-05 ENCOUNTER — Encounter (HOSPITAL_COMMUNITY): Payer: Self-pay | Admitting: Family Medicine

## 2020-03-05 NOTE — Progress Notes (Signed)
PROGRESS NOTE  [name] PNT:[id number] DOB: 10/07/1940 DOA: 02/29/2020 PCP: Martha Clan, MD  Brief History   80 year old woman reportedly lives alone, PMH stroke with residual left-sided weakness, hypertension, presented to the emergency department after found down alone at home, reportedly on the floor for 3 days.  Noted to be very dry, dehydrated.  Admitted for acute rhabdomyolysis, secondary to prolonged immobility, status post fall; hypotension secondary to dehydration, left-sided weakness noted.  Incidentally found to be Covid positive.  Rhabdomyolysis resolved with appropriate treatment as did hypotension and dehydration.  Left-sided weakness chronic, MRI unrevealing.  PT recommended SNF.  Medically stable, awaiting bed.  A & P  Acute rhabdomyolysis secondary to fall at home with prolonged immobility for 3 days.  Fortunately no bony injury or other injuries noted. -- Resolved.  Continue to hold statin.  Fall at home.  No apparent injury.  Reportedly lives alone. --PT, OT recommended SNF, we await bed.  Troponin elevation, no signs or symptoms to suggest ACS at this point. --Troponins flat.  EKG nonacute, no ischemic changes seen. --Probably related to rhabdomyolysis.  No further evaluation suggested.   Left-sided facial droop, left-sided weakness; PMH stroke w/ left-sided weakness. --Had left-sided weakness status post stroke August 2021.  Appears to be at baseline at this point.  MRI negative for acute process. --continue Plavix, can resume statin on discharge. Amlodipine.  COVID-19 positive.  Incidental.  No respiratory compromise. -- Remains asymptomatic, chest x-ray was clear, CRP was unremarkable.  Elevated D-dimer probably from Covid.  Continue supportive care.  Hypotension in the emergency department, sepsis ruled out was dehydrated this problem has resolved after IV fluids.  Elevated anion gap metabolic acidosis likely secondary to lactic acid elevation secondary to  dehydration. --Resolved.   Mild right lower extremity edema -- Negative leg ultrasound swelling improved.  Disposition Plan:  Discussion: Remains medically stable for transfer to SNF, bed awaited.  Dispo: The patient is from: Home              Anticipated d/c is to: SNF              Anticipated d/c date is: 1 day              Patient currently is medically stable to d/c.  DVT prophylaxis: enoxaparin (LOVENOX) injection 40 mg Start: 03/01/20 0800   Code Status: Full Code Family Communication: pt requested I call no one    03/05/2020, 10:03 AM  LOS: 4 days   Interval History/Subjective  CC: f/u fall  Patient in bed, appears comfortable, denies any headache, no fever, no chest pain or pressure, no shortness of breath , no abdominal pain. No focal weakness.   Objective   Vitals:  Vitals:   03/04/20 2045 03/05/20 0631  BP: (!) 145/69 (!) 128/92  Pulse: 84 77  Resp: 19 17  Temp: 98.3 F (36.8 C) 98.5 F (36.9 C)  SpO2: 100% 100%    Exam:  Awake Alert, No new F.N deficits, Normal affect [place].AT,PERRAL Supple Neck,No JVD, No cervical lymphadenopathy appriciated.  Symmetrical Chest wall movement, Good air movement bilaterally, CTAB RRR,No Gallops, Rubs or new Murmurs, No Parasternal Heave +ve B.Sounds, Abd Soft, No tenderness, No organomegaly appriciated, No rebound - guarding or rigidity. No Cyanosis, Clubbing or edema, No new Rash or bruise   I have personally reviewed the following:   Today's Data  . CBG stable  Scheduled Meds: . amLODipine  10 mg Oral Daily  . buPROPion  150 mg  Oral q morning - 10a  . clopidogrel  75 mg Oral Daily  . enoxaparin (LOVENOX) injection  40 mg Subcutaneous Q24H  . ezetimibe  10 mg Oral Daily   Continuous Infusions:   Principal Problem:   Rhabdomyolysis Active Problems:   Fall   Lab test positive for detection of COVID-19 virus   Hypotension   Elevated troponin   Left leg weakness   Leg edema, right   LOS: 4 days    Signature  Susa Raring M.D on 03/05/2020 at 10:04 AM   -  To page go to [email or web address]

## 2020-03-05 NOTE — TOC Progression Note (Signed)
Transition of Care [facility]) - Progression Note    Patient Details  Name: [name] MRN: [id number] Date of Birth: November 30, 1940  Transition of Care [facility]) CM/SW Contact  [name], LCSW Phone Number: 03/05/2020, 3:24 PM  Clinical Narrative:    CSW contacted patient's sister (number for daughter stated has not been set up yet). CSW presented [place] and [place] bed offers for tomorrow and she has selected [facility] due to proximity to her address. CSW faxed clinicals to insurance for review.    Expected Discharge Plan: Skilled Nursing Facility Barriers to Discharge: SNF Pending bed offer,Insurance Authorization  Expected Discharge Plan and Services Expected Discharge Plan: Skilled Nursing Facility       Living arrangements for the past 2 months: Single Family Home                                       Social Determinants of Health (SDOH) Interventions    Readmission Risk Interventions Readmission Risk Prevention Plan 10/09/2019  Post Dischage Appt Not Complete  Appt Comments rec for SNF  Medication Screening Complete  Transportation Screening Complete  Some recent data might be hidden

## 2020-03-05 NOTE — Care Management Important Message (Signed)
Important Message  Patient Details  Name: [name] MRN: [id number] Date of Birth: June 06, 1940   Medicare Important Message Given:  Yes - Important Message mailed due to current National Emergency  Verbal consent obtained due to current National Emergency  Relationship to patient: Self Contact Name: Aoife Bold Call Date: 03/05/20  Time: 1449 Phone: [phone] Outcome: No Answer/Busy Important Message mailed to: Patient address on file    Orson Aloe 03/05/2020, 2:49 PM

## 2020-03-06 DIAGNOSIS — N3281 Overactive bladder: Secondary | ICD-10-CM | POA: Diagnosis not present

## 2020-03-06 DIAGNOSIS — I69354 Hemiplegia and hemiparesis following cerebral infarction affecting left non-dominant side: Secondary | ICD-10-CM | POA: Diagnosis not present

## 2020-03-06 DIAGNOSIS — M6281 Muscle weakness (generalized): Secondary | ICD-10-CM | POA: Diagnosis not present

## 2020-03-06 DIAGNOSIS — M6282 Rhabdomyolysis: Secondary | ICD-10-CM | POA: Diagnosis not present

## 2020-03-06 DIAGNOSIS — I69392 Facial weakness following cerebral infarction: Secondary | ICD-10-CM | POA: Diagnosis not present

## 2020-03-06 DIAGNOSIS — T796XXD Traumatic ischemia of muscle, subsequent encounter: Secondary | ICD-10-CM | POA: Diagnosis not present

## 2020-03-06 DIAGNOSIS — R1312 Dysphagia, oropharyngeal phase: Secondary | ICD-10-CM | POA: Diagnosis not present

## 2020-03-06 DIAGNOSIS — M1990 Unspecified osteoarthritis, unspecified site: Secondary | ICD-10-CM | POA: Diagnosis not present

## 2020-03-06 DIAGNOSIS — I69391 Dysphagia following cerebral infarction: Secondary | ICD-10-CM | POA: Diagnosis not present

## 2020-03-06 DIAGNOSIS — K219 Gastro-esophageal reflux disease without esophagitis: Secondary | ICD-10-CM | POA: Diagnosis not present

## 2020-03-06 DIAGNOSIS — Z9181 History of falling: Secondary | ICD-10-CM | POA: Diagnosis not present

## 2020-03-06 DIAGNOSIS — E785 Hyperlipidemia, unspecified: Secondary | ICD-10-CM | POA: Diagnosis not present

## 2020-03-06 DIAGNOSIS — F329 Major depressive disorder, single episode, unspecified: Secondary | ICD-10-CM | POA: Diagnosis not present

## 2020-03-06 DIAGNOSIS — F330 Major depressive disorder, recurrent, mild: Secondary | ICD-10-CM | POA: Diagnosis not present

## 2020-03-06 DIAGNOSIS — T796XXA Traumatic ischemia of muscle, initial encounter: Secondary | ICD-10-CM | POA: Diagnosis not present

## 2020-03-06 DIAGNOSIS — R2681 Unsteadiness on feet: Secondary | ICD-10-CM | POA: Diagnosis not present

## 2020-03-06 DIAGNOSIS — R600 Localized edema: Secondary | ICD-10-CM | POA: Diagnosis not present

## 2020-03-06 DIAGNOSIS — I10 Essential (primary) hypertension: Secondary | ICD-10-CM | POA: Diagnosis not present

## 2020-03-06 DIAGNOSIS — W19XXXA Unspecified fall, initial encounter: Secondary | ICD-10-CM | POA: Diagnosis not present

## 2020-03-06 DIAGNOSIS — U071 COVID-19: Secondary | ICD-10-CM | POA: Diagnosis not present

## 2020-03-06 DIAGNOSIS — R778 Other specified abnormalities of plasma proteins: Secondary | ICD-10-CM | POA: Diagnosis not present

## 2020-03-06 DIAGNOSIS — Z8673 Personal history of transient ischemic attack (TIA), and cerebral infarction without residual deficits: Secondary | ICD-10-CM | POA: Diagnosis not present

## 2020-03-06 LAB — CULTURE, BLOOD (ROUTINE X 2)
Culture: NO GROWTH
Culture: NO GROWTH
Special Requests: ADEQUATE

## 2020-03-06 MED ORDER — METOPROLOL TARTRATE 50 MG PO TABS: 50.0000 mg | ORAL_TABLET | Freq: Every day | ORAL | Status: DC

## 2020-03-06 NOTE — Discharge Summary (Signed)
[name] NWG:[id number] DOB: [birth year] DOA: 02/29/2020  PCP: Martha Clan, MD  Admit date: 02/29/2020  Discharge date: 03/06/2020  Admitted From: Home   disposition: SNF   Recommendations for Outpatient Follow-up:   Follow up with PCP in 1-2 weeks  PCP Please obtain BMP/CBC, 2 view CXR in 1week,  (see Discharge instructions)   PCP Please follow up on the following pending results: Check CBC, CMP, 2 view chest x-ray in 7 to 10 days   Home Health: None Equipment/Devices: None  Consultations: None  Discharge Condition: Stable    CODE STATUS: Full    Diet Recommendation: Heart Healthy   Diet Order            Diet - low sodium heart healthy           Diet Heart Room service appropriate? Yes; Fluid consistency: Thin  Diet effective now                  Chief Complaint  Patient presents with  . Fall     Brief history of present illness from the day of admission and additional interim summary    80 year old woman reportedly lives alone, PMH stroke with residual left-sided weakness, hypertension, presented to the emergency department after found down alone at home, reportedly on the floor for 3 days.  Noted to be very dry, dehydrated.  Admitted for acute rhabdomyolysis, secondary to prolonged immobility, status post fall; hypotension secondary to dehydration, left-sided weakness noted.  Incidentally found to be Covid positive.  Rhabdomyolysis resolved with appropriate treatment as did hypotension and dehydration.  Left-sided weakness chronic, MRI unrevealing.  PT recommended SNF.  Medically stable, awaiting bed.                                                                  Hospital Course    Acute rhabdomyolysis secondary to fall at home with prolonged immobility for 3 days.  Fortunately no bony injury  or other injuries noted. -- Resolved.  Resume   Statin upon discharge.  Fall at home.  No apparent injury.  Reportedly lives alone. --PT, OT recommended SNF, we await bed.  Troponin elevation, no signs or symptoms to suggest ACS at this point. --Troponins flat.  EKG nonacute, no ischemic changes seen. --Probably related to rhabdomyolysis.  Echocardiogram with preserved EF and no wall motion abnormality.  Continue antiplatelets and statin along with beta-blocker for secondary prevention  Left-sided facial droop, left-sided weakness; PMH stroke w/ left-sided weakness. --Had left-sided weakness status post stroke August 2021.  Appears to be at baseline at this point.  MRI negative for acute process. --continue Plavix, can resume statin on discharge. Amlodipine.  COVID-19 positive.  Incidental.  No respiratory compromise. -- Remains asymptomatic, chest x-ray was clear, CRP was unremarkable.  Elevated  D-dimer probably from Covid.  Continue supportive care.  Hypertension.  Upon admission she was dehydrated and hypotensive, blood pressure medications adjusted, blood pressure now stable after IV fluids and hydration.  Elevated anion gap metabolic acidosis likely secondary to lactic acid elevation secondary to dehydration. --Resolved.  Mild right lower extremity edema -- Negative leg ultrasound swelling improved.   Discharge diagnosis     Principal Problem:   Rhabdomyolysis Active Problems:   Fall   Lab test positive for detection of COVID-19 virus   Hypotension   Elevated troponin   Left leg weakness   Leg edema, right    Discharge instructions    Discharge Instructions    Diet - low sodium heart healthy   Complete by: As directed    Discharge instructions   Complete by: As directed    Follow with Primary MD Martha Clan, MD in 7 days   Get CBC, CMP, 2 view Chest X ray -  checked next visit within 1 week by Primary MD or SNF MD   Activity: As tolerated with Full  fall precautions use walker/cane & assistance as needed  Disposition SNF  Diet: Heart Healthy  with feeding assistance and aspiration precautions.   Special Instructions: If you have smoked or chewed Tobacco  in the last 2 yrs please stop smoking, stop any regular Alcohol  and or any Recreational drug use.  On your next visit with your primary care physician please Get Medicines reviewed and adjusted.  Please request your Prim.MD to go over all Hospital Tests and Procedure/Radiological results at the follow up, please get all Hospital records sent to your Prim MD by signing hospital release before you go home.  If you experience worsening of your admission symptoms, develop shortness of breath, life threatening emergency, suicidal or homicidal thoughts you must seek medical attention immediately by calling 911 or calling your MD immediately  if symptoms less severe.  You Must read complete instructions/literature along with all the possible adverse reactions/side effects for all the Medicines you take and that have been prescribed to you. Take any new Medicines after you have completely understood and accpet all the possible adverse reactions/side effects.   Increase activity slowly   Complete by: As directed       Discharge Medications   Allergies as of 03/06/2020      Reactions   Penicillins Swelling, Other (See Comments)   Has patient had a PCN reaction causing immediate rash, facial/tongue/throat swelling, SOB or lightheadedness with hypotension: Yes Has patient had a PCN reaction causing severe rash involving mucus membranes or skin necrosis: No Has patient had a PCN reaction that required hospitalization: No Has patient had a PCN reaction occurring within the last 10 years: No If all of the above answers are "NO", then may proceed with Cephalosporin use.   Shellfish Allergy Itching, Swelling   Zoloft [sertraline] Rash      Medication List    STOP taking these medications    hydrALAZINE 25 MG tablet Commonly known as: APRESOLINE   hydrochlorothiazide 25 MG tablet Commonly known as: HYDRODIURIL   lisinopril 40 MG tablet Commonly known as: ZESTRIL     TAKE these medications   albuterol 108 (90 Base) MCG/ACT inhaler Commonly known as: VENTOLIN HFA Inhale 2 puffs into the lungs every 6 (six) hours as needed for wheezing or shortness of breath.   amLODipine 10 MG tablet Commonly known as: NORVASC Take 1 tablet (10 mg total) by mouth daily.   atorvastatin 10  MG tablet Commonly known as: LIPITOR Take 1 tablet (10 mg total) by mouth daily.   buPROPion 150 MG 24 hr tablet Commonly known as: WELLBUTRIN XL Take 1 tablet (150 mg total) by mouth every morning.   CALCIUM 600+D HIGH POTENCY PO Take 1 tablet by mouth daily.   clopidogrel 75 MG tablet Commonly known as: PLAVIX Take 1 tablet (75 mg total) by mouth daily.   escitalopram 10 MG tablet Commonly known as: LEXAPRO 1  qam   ezetimibe 10 MG tablet Commonly known as: ZETIA Take 1 tablet (10 mg total) by mouth daily.   metoprolol tartrate 50 MG tablet Commonly known as: LOPRESSOR Take 1 tablet (50 mg total) by mouth daily. What changed:   medication strength  how much to take   Myrbetriq 25 MG Tb24 tablet Generic drug: mirabegron ER Take 1 tablet (25 mg total) by mouth daily.   pantoprazole 20 MG tablet Commonly known as: PROTONIX Take 1 tablet (20 mg total) by mouth daily.   Vitamin D 50 MCG (2000 UT) tablet Take 2,000 Units by mouth daily.        Contact information for follow-up providers    Martha Clan, MD. Schedule an appointment as soon as possible for a visit in 1 week(s).   Specialty: Internal Medicine Contact information: [address] [place] [address] [phone]            Contact information for after-discharge care    Destination    [facility] Preferred SNF .   Service: Skilled Nursing Contact information: [address] [place] [address] [phone]                  Major procedures and Radiology Reports - PLEASE review detailed and final reports thoroughly  -       CT Head Wo Contrast  Result Date: 02/29/2020 CLINICAL DATA:  Neck trauma, intoxicated or obtunded (Age >= 16y) fall, unknown downtime EXAM: CT HEAD WITHOUT CONTRAST CT CERVICAL SPINE WITHOUT CONTRAST TECHNIQUE: Multidetector CT imaging of the head and cervical spine was performed following the standard protocol without intravenous contrast. Multiplanar CT image reconstructions of the cervical spine were also generated. COMPARISON:  CT head and cervical spine 10/22/2018. FINDINGS: CT HEAD FINDINGS Brain: Patchy and confluent areas of decreased attenuation are noted throughout the deep and periventricular white matter of the cerebral hemispheres bilaterally, compatible with chronic microvascular ischemic disease. Similar-appearing right occipital encephalomalacia. No evidence of large-territorial acute infarction. No parenchymal hemorrhage. No mass lesion. No extra-axial collection. No mass effect or midline shift. No hydrocephalus. Basilar cisterns are patent. Vascular: No hyperdense vessel. Skull: No acute fracture or focal lesion. Sinuses/Orbits: Paranasal sinuses and mastoid air cells are clear. The orbits are unremarkable. Other: None. CT CERVICAL SPINE FINDINGS Alignment: Head rotation.  Normal. Skull base and vertebrae: Multilevel degenerative changes of the spine worse at the C5-C6 level. No acute fracture. No aggressive appearing focal osseous lesion or focal pathologic process. Soft tissues and spinal canal: No prevertebral fluid or swelling. No visible canal hematoma. Disc levels:  Maintained. Upper chest: Unremarkable. Other: None. IMPRESSION: 1. No acute intracranial abnormality. 2. No acute displaced fracture or traumatic listhesis of the cervical spine. Electronically Signed   By: Tish Frederickson M.D.   On: 02/29/2020  22:31   CT Cervical Spine Wo Contrast  Result Date: 02/29/2020 CLINICAL DATA:  Neck trauma, intoxicated or obtunded (Age >= 16y) fall, unknown downtime EXAM: CT HEAD WITHOUT CONTRAST CT  CERVICAL SPINE WITHOUT CONTRAST TECHNIQUE: Multidetector CT imaging of the head and cervical spine was performed following the standard protocol without intravenous contrast. Multiplanar CT image reconstructions of the cervical spine were also generated. COMPARISON:  CT head and cervical spine 10/22/2018. FINDINGS: CT HEAD FINDINGS Brain: Patchy and confluent areas of decreased attenuation are noted throughout the deep and periventricular white matter of the cerebral hemispheres bilaterally, compatible with chronic microvascular ischemic disease. Similar-appearing right occipital encephalomalacia. No evidence of large-territorial acute infarction. No parenchymal hemorrhage. No mass lesion. No extra-axial collection. No mass effect or midline shift. No hydrocephalus. Basilar cisterns are patent. Vascular: No hyperdense vessel. Skull: No acute fracture or focal lesion. Sinuses/Orbits: Paranasal sinuses and mastoid air cells are clear. The orbits are unremarkable. Other: None. CT CERVICAL SPINE FINDINGS Alignment: Head rotation.  Normal. Skull base and vertebrae: Multilevel degenerative changes of the spine worse at the C5-C6 level. No acute fracture. No aggressive appearing focal osseous lesion or focal pathologic process. Soft tissues and spinal canal: No prevertebral fluid or swelling. No visible canal hematoma. Disc levels:  Maintained. Upper chest: Unremarkable. Other: None. IMPRESSION: 1. No acute intracranial abnormality. 2. No acute displaced fracture or traumatic listhesis of the cervical spine. Electronically Signed   By: Tish Frederickson M.D.   On: 02/29/2020 22:31   MR BRAIN WO CONTRAST  Result Date: 03/01/2020 CLINICAL DATA:  Fall, found down EXAM: MRI HEAD WITHOUT CONTRAST TECHNIQUE: Multiplanar, multiecho pulse  sequences of the brain and surrounding structures were obtained without intravenous contrast. COMPARISON:  10/08/2019 FINDINGS: Brain: There is no acute infarction. Mild diffusion hyperintensity seen the left centrum semiovale and right corona radiata reflecting T2 shine through. Patchy and confluent areas of T2 hyperintensity in the supratentorial white matter are nonspecific but likely reflects stable chronic microvascular ischemic changes. There are chronic small vessel infarcts of the corona radiata, basal ganglia, and thalamus bilaterally as well as the central pons and right cerebellum. There is also a chronic infarct of the right occipital lobe with associated chronic blood products. Unchanged scattered foci of susceptibility hypointensity reflecting chronic microhemorrhages likely secondary to chronic hypertension. There is no intracranial mass or mass effect.  No hydrocephalus. Vascular: Major vessel flow voids at the skull base are preserved. Skull and upper cervical spine: Normal marrow signal is preserved. Sinuses/Orbits: Mild mucosal thickening.  Orbits are unremarkable. Other: Sella is unremarkable. Minimal left mastoid fluid opacification. IMPRESSION: No acute infarction, hemorrhage, or mass. Stable chronic microvascular ischemic changes. Multiple chronic infarcts. Electronically Signed   By: Guadlupe Spanish M.D.   On: 03/01/2020 13:35   DG Pelvis Portable  Result Date: 02/29/2020 CLINICAL DATA:  Post fall, found down after several days. Right hip pain. EXAM: PORTABLE PELVIS 1-2 VIEWS COMPARISON:  None. FINDINGS: Oblique area of sclerosis involving the right femoral neck, better seen on concurrent femur exam. No other evidence of pelvic fracture. Pubic symphysis and sacroiliac joints are congruent. Degenerative change of the right greater than left sacroiliac joint. Pubic rami are intact. IMPRESSION: Oblique area of sclerosis involving the right femoral neck, better seen on concurrent femur exam.  This may represent a nondisplaced fracture versus artifact. Recommend further evaluation with CT or MRI. Electronically Signed   By: Narda Rutherford M.D.   On: 02/29/2020 22:03   CT Hip Right Wo Contrast  Result Date: 03/01/2020 CLINICAL DATA:  Found down after several days, upper thigh pain EXAM: CT OF THE RIGHT HIP WITHOUT CONTRAST TECHNIQUE: Multidetector CT imaging of the right hip was performed  according to the standard protocol. Multiplanar CT image reconstructions were also generated. COMPARISON:  CT 01/14/2019 FINDINGS: Bones/Joint/Cartilage No acute fracture or traumatic malalignment is seen of the right hip or included bony pelvis. The proximal femur is intact with the femoral head normally located within the acetabulum. There is a background moderate right hip osteoarthrosis as well as enthesopathic changes upon the greater trochanter and ischial tuberosity. Additional sclerotic changes are noted at the SI joints, right greater than left, asymmetry of this finding could reflect sequela of prior sacroiliitis though overall the appearance is similar to comparison imaging in 2020. Mild degenerative changes at the pubic symphysis, also stable. No suspicious lytic or blastic lesions. Ligaments Suboptimally assessed by CT. Muscles and Tendons Question some minimal edematous change of the right gluteal musculature possibly related to contusion in the setting of fall though may be accentuated by patient motion artifact towards the superior margins of imaging. No visible intramuscular collection is seen within the limitations of this unenhanced CT. Soft tissues Included portions of the pelvis demonstrate extensive atherosclerotic calcification in the vasculature. Pelvic contents are free of acute abnormality. There is some mild stranding and edematous changes of the right hip, could reflect contusive change or some more generalized soft tissue edema. No soft tissue gas foreign body. No large hematoma.  IMPRESSION: 1. No acute fracture or traumatic malalignment of the right hip or included bony pelvis. 2. Mild soft tissue stranding and edematous changes of the lateral right hip, could reflect contusive change or some more generalized soft tissue edema. Some minimal edematous changes of the right gluteal musculature as well. No large hematoma. No soft tissue gas foreign body. 3. Aortic Atherosclerosis (ICD10-I70.0). Electronically Signed   By: Kreg Shropshire M.D.   On: 03/01/2020 00:50   DG Chest Portable 1 View  Result Date: 02/29/2020 CLINICAL DATA:  Post fall, found down after several days. Rhonchi on the right. EXAM: PORTABLE CHEST 1 VIEW COMPARISON:  10/08/2019 FINDINGS: Low lung volumes. Borderline cardiomegaly, unchanged. Stable mediastinal contours with aortic atherosclerosis. No pulmonary edema. No visualized pneumothorax or pleural effusion. No acute osseous abnormalities are seen. Cervical collar in place. IMPRESSION: Low lung volumes without acute process. Electronically Signed   By: Narda Rutherford M.D.   On: 02/29/2020 22:00   DG Abd Portable 1 View  Result Date: 03/01/2020 CLINICAL DATA:  MRI screen EXAM: PORTABLE ABDOMEN - 1 VIEW COMPARISON:  February 29, 2020 FINDINGS: Air-filled nondilated loops of bowel. Severe degenerative changes of the RIGHT hip. Limited evaluation of the sacrum secondary to overlying bowel gas. There is an 18 mm circular metallic density projecting over the LEFT upper abdomen. Atherosclerotic calcifications of the aorta. Two calcific densities project over the LEFT hemiabdomen, unchanged and consistent with the sequela of fat necrosis. IMPRESSION: 1. 18 mm circular metallic density projecting over the LEFT upper abdomen. Recommend correlation with physical exam to determine whether this is external to patient. Electronically Signed   By: Meda Klinefelter MD   On: 03/01/2020 12:32   DG Hip Unilat W or Wo Pelvis 2-3 Views Left  Result Date: 02/29/2020 CLINICAL DATA:   Fall several days ago with pain. EXAM: DG HIP (WITH OR WITHOUT PELVIS) 2-3V LEFT COMPARISON:  None. FINDINGS: Degenerative changes of the right hip with osteophytes. No left hip fracture identified. No dislocation. Vascular calcifications in the femoral arteries bilaterally. IMPRESSION: Degenerative changes. Vascular calcifications in the bilateral femoral arteries. No left hip fracture identified. Electronically Signed   By: Gerome Sam III  M.D   On: 02/29/2020 19:55   DG Femur Min 2 Views Right  Result Date: 02/29/2020 CLINICAL DATA:  Post fall, found down after several days. EXAM: RIGHT FEMUR 2 VIEWS COMPARISON:  None. FINDINGS: Oblique area of sclerosis involving the femoral neck may represent an impaction fracture versus overlapping osteophytes. Femoral head is well seated in the acetabulum. More distal femur is intact. Knee alignment is maintained. There are advanced vascular calcifications. IMPRESSION: Oblique area of sclerosis involving the femoral neck may represent an impaction fracture versus overlapping osteophytes. Recommend further evaluation with CT or MRI. Electronically Signed   By: Narda Rutherford M.D.   On: 02/29/2020 22:02   VAS [place] LOWER EXTREMITY VENOUS (DVT)  Result Date: 03/02/2020  Lower Venous DVT Study Indications: Elevated Ddimer.  Risk Factors: COVID 19 positive. Limitations: Body habitus, poor ultrasound/tissue interface and patient positioning. Comparison Study: No prior studies. Performing Technologist: Chanda Busing RVT  Examination Guidelines: A complete evaluation includes B-mode imaging, spectral Doppler, color Doppler, and power Doppler as needed of all accessible portions of each vessel. Bilateral testing is considered an integral part of a complete examination. Limited examinations for reoccurring indications may be performed as noted. The reflux portion of the exam is performed with the patient in reverse Trendelenburg.   +---------+---------------+---------+-----------+----------+--------------+ RIGHT    CompressibilityPhasicitySpontaneityPropertiesThrombus Aging +---------+---------------+---------+-----------+----------+--------------+ CFV      Full           Yes      Yes                                 +---------+---------------+---------+-----------+----------+--------------+ SFJ      Full                                                        +---------+---------------+---------+-----------+----------+--------------+ FV Prox  Full                                                        +---------+---------------+---------+-----------+----------+--------------+ FV Mid   Full                                                        +---------+---------------+---------+-----------+----------+--------------+ FV DistalFull                                                        +---------+---------------+---------+-----------+----------+--------------+ PFV      Full                                                        +---------+---------------+---------+-----------+----------+--------------+ POP      Full  Yes      Yes                                 +---------+---------------+---------+-----------+----------+--------------+ PTV      Full                                                        +---------+---------------+---------+-----------+----------+--------------+ PERO     Full                                                        +---------+---------------+---------+-----------+----------+--------------+   +---------+---------------+---------+-----------+----------+--------------+ LEFT     CompressibilityPhasicitySpontaneityPropertiesThrombus Aging +---------+---------------+---------+-----------+----------+--------------+ CFV      Full           Yes      Yes                                  +---------+---------------+---------+-----------+----------+--------------+ SFJ      Full                                                        +---------+---------------+---------+-----------+----------+--------------+ FV Prox  Full                                                        +---------+---------------+---------+-----------+----------+--------------+ FV Mid   Full                                                        +---------+---------------+---------+-----------+----------+--------------+ FV DistalFull                                                        +---------+---------------+---------+-----------+----------+--------------+ PFV      Full                                                        +---------+---------------+---------+-----------+----------+--------------+ POP      Full           Yes      Yes                                 +---------+---------------+---------+-----------+----------+--------------+ PTV  Full                                                        +---------+---------------+---------+-----------+----------+--------------+ PERO     Full                                                        +---------+---------------+---------+-----------+----------+--------------+     Summary: RIGHT: - There is no evidence of deep vein thrombosis in the lower extremity. However, portions of this examination were limited- see technologist comments above.  - No cystic structure found in the popliteal fossa.  LEFT: - There is no evidence of deep vein thrombosis in the lower extremity. However, portions of this examination were limited- see technologist comments above.  - No cystic structure found in the popliteal fossa.  *See table(s) above for measurements and observations. Electronically signed by Sherald Hess MD on 03/02/2020 at 1:22:09 PM.    Final    ECHOCARDIOGRAM LIMITED  Result Date: 03/01/2020    ECHOCARDIOGRAM  LIMITED REPORT   Patient Name:   [name] Date of Exam: 03/01/2020 Medical Rec #:  [id number]   Height:       64.0 in Accession #:    [id number]  Weight:       168.0 lb Date of Birth:  1940/12/10   BSA:          1.817 m Patient Age:    79 years    BP:           143/118 mmHg Patient Gender: F           HR:           81 bpm. Exam Location:  Inpatient Procedure: Limited Echo, Cardiac Doppler and Color Doppler Indications:    Elevated troponin  History:        Patient has prior history of Echocardiogram examinations, most                 recent 10/09/2019. Risk Factors:Dyslipidemia, Former Smoker and                 Hypertension. COVID-19. PVD. H/O stroke.  Sonographer:    Ross Ludwig RDCS (AE) Referring Phys: [id number] [facility]  Sonographer Comments: COVID-19 IMPRESSIONS  1. Left ventricular ejection fraction, by estimation, is 70 to 75%. The left ventricle has hyperdynamic function. There is severe concentric left ventricular hypertrophy. Left ventricular diastolic parameters are consistent with Grade I diastolic dysfunction (impaired relaxation).  2. Right ventricular systolic function is normal. The right ventricular size is normal.  3. The mitral valve is grossly normal. Trivial mitral valve regurgitation.  4. The aortic valve is grossly normal. Aortic valve regurgitation is not visualized. FINDINGS  Left Ventricle: Left ventricular ejection fraction, by estimation, is 70 to 75%. The left ventricle has hyperdynamic function. The left ventricular internal cavity size was normal in size. There is severe concentric left ventricular hypertrophy. Left ventricular diastolic parameters are consistent with Grade I diastolic dysfunction (impaired relaxation). Right Ventricle: The right ventricular size is normal. Right ventricular systolic function is normal. Mitral Valve: The mitral valve is grossly normal. Mild to moderate mitral annular  calcification. Trivial mitral valve regurgitation. Aortic Valve: The aortic  valve is grossly normal. Aortic valve regurgitation is not visualized. Aortic valve mean gradient measures 4.0 mmHg. Aortic valve peak gradient measures 8.5 mmHg. Aortic valve area, by VTI measures 3.78 cm. LEFT VENTRICLE PLAX 2D LVIDd:         4.00 cm  Diastology LVIDs:         2.80 cm  LV e' medial:    6.09 cm/s LV PW:         2.00 cm  LV E/e' medial:  6.2 LV IVS:        2.00 cm  LV e' lateral:   6.09 cm/s LVOT diam:     2.10 cm  LV E/e' lateral: 6.2 LV SV:         75 LV SV Index:   41 LVOT Area:     3.46 cm  IVC IVC diam: 1.00 cm LEFT ATRIUM         Index LA diam:    3.60 cm 1.98 cm/m  AORTIC VALVE AV Area (Vmax):    3.32 cm AV Area (Vmean):   2.66 cm AV Area (VTI):     3.78 cm AV Vmax:           146.00 cm/s AV Vmean:          97.900 cm/s AV VTI:            0.198 m AV Peak Grad:      8.5 mmHg AV Mean Grad:      4.0 mmHg LVOT Vmax:         140.00 cm/s LVOT Vmean:        75.300 cm/s LVOT VTI:          0.216 m LVOT/AV VTI ratio: 1.09  AORTA Ao Root diam: 3.50 cm MITRAL VALVE MV Area (PHT): 4.68 cm    SHUNTS MV Decel Time: 162 msec    Systemic VTI:  0.22 m MV E velocity: 37.70 cm/s  Systemic Diam: 2.10 cm MV A velocity: 65.60 cm/s MV E/A ratio:  0.57 Kristeen MissPhilip Nahser MD Electronically signed by Kristeen MissPhilip Nahser MD Signature Date/Time: 03/01/2020/3:25:36 PM    Final     Micro Results     Recent Results (from the past 240 hour(s))  SARS CORONAVIRUS 2 (TAT 6-24 HRS) Nasopharyngeal Nasopharyngeal Swab     Status: Abnormal   Collection Time: 02/29/20 11:08 PM   Specimen: Nasopharyngeal Swab  Result Value Ref Range Status   SARS Coronavirus 2 POSITIVE (A) NEGATIVE Final    Comment: (NOTE) SARS-CoV-2 target nucleic acids are DETECTED.  The SARS-CoV-2 RNA is generally detectable in upper and lower respiratory specimens during the acute phase of infection. Positive results are indicative of the presence of SARS-CoV-2 RNA. Clinical correlation with patient history and other diagnostic information is   necessary to determine patient infection status. Positive results do not rule out bacterial infection or co-infection with other viruses.  The expected result is Negative.  Fact Sheet for Patients: HairSlick.nohttps://www.fda.gov/media/138098/download  Fact Sheet for Healthcare Providers: quierodirigir.comhttps://www.fda.gov/media/138095/download  This test is not yet approved or cleared by the [place] FDA and  has been authorized for detection and/or diagnosis of SARS-CoV-2 by FDA under an Emergency Use Authorization (EUA). This EUA will remain  in effect (meaning this test can be used) for the duration of the COVID-19 declaration under Section 564(b)(1) of the Act, 21 U. S.C. section 360bbb-3(b)(1), unless the authorization is terminated or revoked sooner.  Performed at [facility], [address] [place], [place] [address]   Blood Culture (routine x 2)     Status: None   Collection Time: 03/01/20 12:01 AM   Specimen: BLOOD RIGHT HAND  Result Value Ref Range Status   Specimen Description BLOOD RIGHT HAND  Final   Special Requests   Final    BOTTLES DRAWN AEROBIC AND ANAEROBIC Blood Culture adequate volume   Culture   Final    NO GROWTH 5 DAYS Performed at [facility], [address] [place], [place] [address]    Report Status 03/06/2020 FINAL  Final  Blood Culture (routine x 2)     Status: None   Collection Time: 03/01/20 12:01 AM   Specimen: BLOOD RIGHT ARM  Result Value Ref Range Status   Specimen Description BLOOD RIGHT ARM  Final   Special Requests   Final    BOTTLES DRAWN AEROBIC AND ANAEROBIC Blood Culture results may not be optimal due to an inadequate volume of blood received in culture bottles   Culture   Final    NO GROWTH 5 DAYS Performed at [facility], [address] [place], [place] [address]    Report Status 03/06/2020 FINAL  Final  Urine culture     Status: None   Collection Time: 03/01/20  1:26 AM   Specimen: In/Out Cath Urine  Result Value Ref  Range Status   Specimen Description IN/OUT CATH URINE  Final   Special Requests NONE  Final   Culture   Final    NO GROWTH Performed at [facility], [address] [place], [place] [address]    Report Status 03/02/2020 FINAL  Final    Today   Subjective    [name] today has no headache,no chest abdominal pain,no new weakness tingling or numbness, feels much better     Objective   Blood pressure 138/80, pulse 76, temperature 98.9 F (37.2 C), temperature source Axillary, resp. rate 19, SpO2 100 %.  No intake or output data in the 24 hours ending 03/06/20 0938  Exam  Awake Alert, No new F.N deficits, flat affect [place].AT,PERRAL Supple Neck,No JVD, No cervical lymphadenopathy appriciated.  Symmetrical Chest wall movement, Good air movement bilaterally, CTAB RRR,No Gallops,Rubs or new Murmurs, No Parasternal Heave +ve B.Sounds, Abd Soft, Non tender, No organomegaly appriciated, No rebound -guarding or rigidity. No Cyanosis, Clubbing or edema, No new Rash or bruise   Data Review   CBC w Diff:  Lab Results  Component Value Date   WBC 4.5 03/02/2020   HGB 13.0 03/02/2020   HCT 43.4 03/02/2020   PLT 241 03/02/2020   LYMPHOPCT 39 10/11/2019   MONOPCT 9 10/11/2019   EOSPCT 1 10/11/2019   BASOPCT 1 10/11/2019    CMP:  Lab Results  Component Value Date   NA 139 03/03/2020   K 3.5 03/03/2020   CL 107 03/03/2020   CO2 24 03/03/2020   BUN 22 03/03/2020   CREATININE 1.18 (H) 03/03/2020   PROT 5.5 (L) 03/03/2020   ALBUMIN 3.2 (L) 03/03/2020   BILITOT 0.7 03/03/2020   ALKPHOS 35 (L) 03/03/2020   AST 34 03/03/2020   ALT 11 03/03/2020  .   Total Time in preparing paper work, data evaluation and todays exam - 35 minutes  Susa RaringPrashant Singh M.D on 03/06/2020 at 9:38 AM  [facility]

## 2020-03-06 NOTE — Plan of Care (Signed)
  Problem: Coping: Goal: Psychosocial and spiritual needs will be supported Outcome: Progressing   Problem: Respiratory: Goal: Will maintain a patent airway Outcome: Progressing Goal: Complications related to the disease process, condition or treatment will be avoided or minimized Outcome: Progressing   

## 2020-03-06 NOTE — Discharge Instructions (Signed)
Follow with Primary MD Martha Clan, MD in 7 days   Get CBC, CMP, 2 view Chest X ray -  checked next visit within 1 week by Primary MD or SNF MD   Activity: As tolerated with Full fall precautions use walker/cane & assistance as needed  Disposition SNF  Diet: Heart Healthy  with feeding assistance and aspiration precautions.   Special Instructions: If you have smoked or chewed Tobacco  in the last 2 yrs please stop smoking, stop any regular Alcohol  and or any Recreational drug use.  On your next visit with your primary care physician please Get Medicines reviewed and adjusted.  Please request your Prim.MD to go over all Hospital Tests and Procedure/Radiological results at the follow up, please get all Hospital records sent to your Prim MD by signing hospital release before you go home.  If you experience worsening of your admission symptoms, develop shortness of breath, life threatening emergency, suicidal or homicidal thoughts you must seek medical attention immediately by calling 911 or calling your MD immediately  if symptoms less severe.  You Must read complete instructions/literature along with all the possible adverse reactions/side effects for all the Medicines you take and that have been prescribed to you. Take any new Medicines after you have completely understood and accpet all the possible adverse reactions/side effects.

## 2020-03-06 NOTE — TOC Progression Note (Signed)
Transition of Care [facility]) - Progression Note    Patient Details  Name: [name] MRN: [id number] Date of Birth: [birth year]  Transition of Care [facility]) CM/SW Contact  [name], LCSW Phone Number: 03/06/2020, 8:33 AM  Clinical Narrative:    CSW received insurance approval for patient to discharge today to [facility] SNF: #[id number], effective 03/06/20-03/10/20.   Expected Discharge Plan: Skilled Nursing Facility Barriers to Discharge: Barriers Resolved  Expected Discharge Plan and Services Expected Discharge Plan: Skilled Nursing Facility       Living arrangements for the past 2 months: Single Family Home                                       Social Determinants of Health (SDOH) Interventions    Readmission Risk Interventions Readmission Risk Prevention Plan 10/09/2019  Post Dischage Appt Not Complete  Appt Comments rec for SNF  Medication Screening Complete  Transportation Screening Complete  Some recent data might be hidden

## 2020-03-06 NOTE — TOC Transition Note (Signed)
Transition of Care [facility]) - CM/SW Discharge Note   Patient Details  Name: [name] MRN: [id number] Date of Birth: 1940-04-12  Transition of Care [facility]) CM/SW Contact:  [name], LCSW Phone Number: 03/06/2020, 11:41 AM   Clinical Narrative:    Patient will DC to: [facility] Anticipated DC date: 03/06/20 Family notified: Sister, [name] Transport by: Sharin Mons 12:30pm   Per MD patient ready for DC to [facility] RN to call report prior to discharge [phone]). RN, patient, patient's family, and facility notified of DC. Discharge Summary and FL2 sent to facility. DC packet on chart. Ambulance transport requested for patient.   CSW will sign off for now as social work intervention is no longer needed. Please consult [place] again if new needs arise.     Final next level of care: Skilled Nursing Facility Barriers to Discharge: Barriers Resolved   Patient Goals and CMS Choice Patient states their goals for this hospitalization and ongoing recovery are:: to go to SNF CMS Medicare.gov Compare Post Acute Care list provided to:: Patient Represenative (must comment) Choice offered to / list presented to : Patient  Discharge Placement   Existing PASRR number confirmed : 03/06/20          Patient [facility] bed at: [facility] Patient to be transferred to facility by: [facility] Name of family member notified: Sister Patient and family notified of of transfer: 03/06/20  Discharge Plan and Services                                     Social Determinants of Health (SDOH) Interventions     Readmission Risk Interventions Readmission Risk Prevention Plan 10/09/2019  Post Dischage Appt Not Complete  Appt Comments rec for SNF  Medication Screening Complete  Transportation Screening Complete  Some recent data might be hidden

## 2020-03-07 ENCOUNTER — Non-Acute Institutional Stay (SKILLED_NURSING_FACILITY): Payer: Medicare Other | Admitting: Internal Medicine

## 2020-03-07 ENCOUNTER — Encounter: Payer: Self-pay | Admitting: Internal Medicine

## 2020-03-07 DIAGNOSIS — R778 Other specified abnormalities of plasma proteins: Secondary | ICD-10-CM

## 2020-03-07 DIAGNOSIS — U071 COVID-19: Secondary | ICD-10-CM | POA: Diagnosis not present

## 2020-03-07 DIAGNOSIS — T796XXD Traumatic ischemia of muscle, subsequent encounter: Secondary | ICD-10-CM

## 2020-03-07 DIAGNOSIS — T796XXA Traumatic ischemia of muscle, initial encounter: Secondary | ICD-10-CM

## 2020-03-07 NOTE — Patient Instructions (Signed)
See assessment and plan under each diagnosis in the problem list and acutely for this visit 

## 2020-03-07 NOTE — Progress Notes (Signed)
NURSING HOME LOCATION:  [facility] ROOM NUMBER:  [id number]  CODE STATUS:  FULL CODE  PCP:  Martha Clan, MD  [address] [place] [address]  This is a comprehensive admission note to [facility] performed on this date less than 30 days from date of admission. Included are preadmission medical/surgical history; reconciled medication list; family history; social history and comprehensive review of systems.  Corrections and additions to the records were documented. Comprehensive physical exam was also performed. Additionally a clinical summary was entered for each active diagnosis pertinent to this admission in the Problem List to enhance continuity of care.  HPI: Patient was hospitalized 1/7 - 03/06/2020 found down alone at home, possibly on the floor for 3 days.  Obviously she was clinically very dehydrated.  Rhabdomyolysis was documented secondary to prolonged immobility.  She was also noted to be hypotensive, attributed to the dehydration.  Incidental finding was COVID positivity. There was residual left-sided weakness in the context of prior history of stroke in August 2021.  MRI was unrevealing. With the clinical dehydration and hypotension blood pressure medications were adjusted and reinitiated after IV fluid resuscitation. Rhabdo resolved with fluid resuscitation.  During hospitalization statin was held. Troponin was elevated but values stable without signs or symptoms to suggest ACS.  EKG revealed no ischemic changes.  Echo revealed preserved EF with no wall motion abnormality. Elevated lactic acid was attributed to dehydration. The COVID 19 positivity was felt to be incidental and there was no evidence of respiratory compromise.  Chest x-ray was clear.  C-reactive protein was not elevated.  Elevated D-dimer was assumed to be related to COVID.  There was mild right lower extremity edema; ultrasound was negative for DVT.  Past medical and surgical history: Includes  essential hypertension, dyslipidemia, depression, and degenerative joint disease. Surgeries and procedures include abdominal hysterectomy and shoulder arthroscopy with rotator cuff repair and subacromial decompression.  Social history: Nondrinker, former smoker.  Family history: Extensive family history reviewed; family history is strongly positive for heart disease, diabetes and hypertension.   Review of systems: She gave the correct date.  She stated that she was in the hospital because she had attempted to sit on a chair but hit only the edge falling on the floor. She stated she was unable to get up but cannot explain to me why she could not get up.  She did not give me any history to suggest any acute illness prior to the fall.  Review of systems was totally negative except for "some depression at times".  She specifically denied any cardiac, neuro, GI, or infectious symptoms.  Constitutional: No fever, significant weight change  Eyes: No redness, discharge, pain, vision change ENT/mouth: No nasal congestion, purulent discharge, earache, change in hearing, sore throat  Cardiovascular: No chest pain, palpitations, paroxysmal nocturnal dyspnea, claudication, edema  Respiratory: No cough, sputum production, hemoptysis, DOE, significant snoring, apnea Gastrointestinal: No heartburn, dysphagia, abdominal pain, nausea /vomiting, rectal bleeding, melena, change in bowels Genitourinary: No dysuria, hematuria, pyuria, incontinence, nocturia Musculoskeletal: No joint stiffness, joint swelling, weakness, pain Dermatologic: No rash, pruritus, change in appearance of skin Neurologic: No dizziness, headache, syncope, seizures, numbness, tingling Psychiatric: No significant anxiety,  insomnia, anorexia Endocrine: No change in hair/skin/nails, excessive thirst, excessive hunger, excessive urination  Hematologic/lymphatic: No significant bruising, lymphadenopathy, abnormal bleeding Allergy/immunology: No  itchy/watery eyes, significant sneezing, urticaria, angioedema  Physical exam:  Pertinent or positive findings: She is obese and appears adequately nourished.  Hair is disheveled.  The left  globe is prominent.  She does tend to have sort of a wide-eyed stare.  The left eye intermittently deviates medially and superiorly.  The left nasolabial fold is decreased compared to the right.  She is wearing only the upper denture.  She has slight hirsutism of the chin.  She has low-grade bilateral rales and rhonchi.  The first and second heart sounds are accentuated and the second heart sound is split.  Pedal pulses are decreased.  There is trace edema at the sock line.  She is weaker to opposition in the left upper extremity than the right.  Strength of the lower extremities clinically appears to be essentially equal.  General appearance: Adequately nourished; no acute distress, increased work of breathing is present.   Lymphatic: No lymphadenopathy about the head, neck, axilla. Eyes: No conjunctival inflammation or lid edema is present. There is no scleral icterus. Ears:  External ear exam shows no significant lesions or deformities.   Nose:  External nasal examination shows no deformity or inflammation. Nasal mucosa are pink and moist without lesions, exudates Oral exam: Lips and gums are healthy appearing. Neck:  No thyromegaly, masses, tenderness noted.    Heart:  Normal rate and regular rhythm without gallop, murmur, click, rub.  Lungs: without wheezes,rubs. Abdomen: Bowel sounds are normal.  Abdomen is soft and nontender with no organomegaly, hernias, masses. GU: Deferred  Extremities:  No cyanosis, clubbing. Neurologic exam: Balance, Rhomberg, finger to nose testing could not be completed due to clinical state Skin: Warm & dry w/o tenting. No significant lesions or rash.  See clinical summary under each active problem in the Problem List with associated updated therapeutic plan

## 2020-03-07 NOTE — Assessment & Plan Note (Signed)
Statin reinitiated after resolution of rhabdo.

## 2020-03-08 NOTE — Assessment & Plan Note (Signed)
No cardiopulmonary symptoms at this time. 

## 2020-03-08 NOTE — Assessment & Plan Note (Signed)
SARs-CoV-2, NAA could be considered if there is a question of any COVID component.

## 2020-03-13 LAB — COMPREHENSIVE METABOLIC PANEL
Albumin: 3.8 (ref 3.5–5.0)
Calcium: 9.3 (ref 8.7–10.7)
GFR calc Af Amer: 73.35
GFR calc non Af Amer: 63.28
Globulin: 2.4

## 2020-03-13 LAB — CBC AND DIFFERENTIAL
HCT: 35 — AB (ref 36–46)
Hemoglobin: 11.4 — AB (ref 12.0–16.0)
Neutrophils Absolute: 3.6
Platelets: 437 — AB (ref 150–399)
WBC: 5.4

## 2020-03-13 LAB — BASIC METABOLIC PANEL
BUN: 14 (ref 4–21)
CO2: 24 — AB (ref 13–22)
Chloride: 107 (ref 99–108)
Creatinine: 0.9 (ref 0.5–1.1)
Glucose: 97
Potassium: 4.4 (ref 3.4–5.3)
Sodium: 142 (ref 137–147)

## 2020-03-13 LAB — HEPATIC FUNCTION PANEL
ALT: 5 — AB (ref 7–35)
AST: 11 — AB (ref 13–35)
Alkaline Phosphatase: 56 (ref 25–125)
Bilirubin, Total: 0.6

## 2020-03-13 LAB — CBC: RBC: 4.52 (ref 3.87–5.11)

## 2020-03-20 ENCOUNTER — Encounter: Payer: Self-pay | Admitting: Adult Health

## 2020-03-20 ENCOUNTER — Non-Acute Institutional Stay (SKILLED_NURSING_FACILITY): Payer: Medicare Other | Admitting: Adult Health

## 2020-03-20 DIAGNOSIS — F330 Major depressive disorder, recurrent, mild: Secondary | ICD-10-CM

## 2020-03-20 DIAGNOSIS — I10 Essential (primary) hypertension: Secondary | ICD-10-CM

## 2020-03-20 DIAGNOSIS — N3281 Overactive bladder: Secondary | ICD-10-CM | POA: Diagnosis not present

## 2020-03-20 DIAGNOSIS — Z8673 Personal history of transient ischemic attack (TIA), and cerebral infarction without residual deficits: Secondary | ICD-10-CM | POA: Diagnosis not present

## 2020-03-20 NOTE — Progress Notes (Signed)
Location:  [facility] Room Number: 202-A Place of Service:  SNF (31) Provider:  Kenard Gower, DNP, FNP-BC  Patient Care Team: Martha Clan, MD as PCP - General (Internal Medicine)  Extended Emergency Contact Information Primary Emergency Contact: Margarita Grizzle Home Phone: [phone] Mobile Phone: [phone] Relation: Sister Preferred language: English Interpreter needed? No Secondary Emergency Contact: McCollum,[name] Address: [address]          [place], [place] Home Phone: [phone] Work Phone: [phone] Mobile Phone: [phone] Relation: Daughter  Code Status:  Full Code  Goals of care: Advanced Directive information Advanced Directives 03/02/2020  Does Patient Have a Medical Advance Directive? No  Type of Advance Directive -  Does patient want to make changes to medical advance directive? -  Would patient like information on creating a medical advance directive? No - Patient declined  Some encounter information is confidential and restricted. Go to Review Flowsheets activity to see all data.     Chief Complaint  Patient presents with  . Acute Visit    Short-term rehabilitation visit    HPI:  Pt is a 80 y.o. female seen today for medical management of chronic diseases. She is a short-term care resident of [facility].  She has a PMH of stroke with residual left-sided weakness and hypertension. SBPs ranging from102 to 145, outlier 165. She takes metoprolol tartrate 50 mg 1 tab daily and Norvasc 10 mg daily for hypertension. She has left-sided weakness due to history of CVA. She takes Plavix 75 mg daily, Lipitor 10 mg daily and Zetia 10 mg daily for history of CVA.  She participates in daily PT and OT for therapeutic strengthening exercises.  She was admitted to [facility] on 03/06/20 post hospitalization 02/29/20 to 03/06/20.  She presented to the  emergency department after being found down alone at home, reportedly on the floor for 3 days.  She was admitted for acute rhabdomyolysis secondary to prolonged immobility, status post fall.  She had hypotension secondary to dehydration, left-sided weakness noted. She had history of stroke August 2021 and MRI negative for acute process.  She was incidentally found to be Covid positive.  Chest x-ray was clear and CRP was unremarkable.   Past Medical History:  Diagnosis Date  . Arthritis   . Depression   . High cholesterol   . Hypertension   . Pneumonia   . Stroke [facility])    1994 no residuals   Past Surgical History:  Procedure Laterality Date  . ABDOMINAL HYSTERECTOMY    . catheterization of the left superficial femoral artery  10/20/2009  . SHOULDER ARTHROSCOPY WITH ROTATOR CUFF REPAIR AND SUBACROMIAL DECOMPRESSION Left 01/27/2018   Procedure: LEFT SHOULDER ARTHROSCOPY WITH DEBRIDEMENT, SUBACROMIAL DECOMPRESSION AND ROTATOR CUFF REPAIR;  Surgeon: Kathryne Hitch, MD;  Location: [facility] ORS;  Service: Orthopedics;  Laterality: Left;  . SHOULDER SURGERY     Left with dr. Gayla Doss arthroscopy with possible rotator cuff repair  . TONSILLECTOMY      Allergies  Allergen Reactions  . Penicillins Swelling and Other (See Comments)    Has patient had a PCN reaction causing immediate rash, facial/tongue/throat swelling, SOB or lightheadedness with hypotension: Yes Has patient had a PCN reaction causing severe rash involving mucus membranes or skin necrosis: No Has patient had a PCN reaction that required hospitalization: No Has patient had a PCN reaction occurring within the last 10 years: No If all of the above answers are "NO",  then may proceed with Cephalosporin use.   . Shellfish Allergy Itching and Swelling  . Zoloft [Sertraline] Rash    Outpatient Encounter Medications as of 03/20/2020  Medication Sig  . albuterol (VENTOLIN HFA) 108 (90 Base) MCG/ACT inhaler Inhale 2 puffs into the  lungs every 6 (six) hours as needed for wheezing or shortness of breath.   . magnesium hydroxide (MILK OF MAGNESIA) 400 MG/5ML suspension Take 30 mLs by mouth daily as needed for mild constipation.  . Sodium Phosphates (RA SALINE ENEMA RE) Place 1 each rectally daily as needed.  Marland Kitchen amLODipine (NORVASC) 10 MG tablet Take 10 mg by mouth daily.  Marland Kitchen atorvastatin (LIPITOR) 10 MG tablet Take 10 mg by mouth daily.  . bisacodyl (DULCOLAX) 10 MG suppository Place 10 mg rectally as needed for moderate constipation.  Marland Kitchen buPROPion (WELLBUTRIN XL) 150 MG 24 hr tablet Take 150 mg by mouth daily.  . Calcium Carbonate-Vitamin D (CALCIUM 600+D HIGH POTENCY PO) Take 1 tablet by mouth daily.   . Cholecalciferol (VITAMIN D) 2000 UNITS tablet Take 2,000 Units by mouth daily.  . clopidogrel (PLAVIX) 75 MG tablet Take 1 tablet (75 mg total) by mouth daily.  Marland Kitchen escitalopram (LEXAPRO) 10 MG tablet 1  qam  . ezetimibe (ZETIA) 10 MG tablet Take 10 mg by mouth daily.  . metoprolol tartrate (LOPRESSOR) 50 MG tablet Take 1 tablet (50 mg total) by mouth daily.  . pantoprazole (PROTONIX) 20 MG tablet Take 20 mg by mouth daily.   No facility-administered encounter medications on file as of 03/20/2020.    Review of Systems  GENERAL: No change in appetite, no fatigue, no weight changes, no fever, chills or weakness MOUTH and THROAT: Denies oral discomfort, gingival pain or bleeding RESPIRATORY: no cough, SOB, DOE, wheezing, hemoptysis CARDIAC: No chest pain, edema or palpitations GI: No abdominal pain, diarrhea, constipation, heart burn, nausea or vomiting GU: Denies dysuria, frequency, hematuria or discharge NEUROLOGICAL: Denies dizziness, syncope, numbness, or headache PSYCHIATRIC: Denies feelings of depression or anxiety. No report of hallucinations, insomnia, paranoia, or agitation    Immunization History  Administered Date(s) Administered  . Influenza, High Dose Seasonal PF 11/09/2016, 12/01/2018  . Pneumococcal  Polysaccharide-23 05/24/1998  . Td 03/26/1999   Pertinent  Health Maintenance Due  Topic Date Due  . DEXA SCAN  Never done  . PNA vac Low Risk Adult (1 of 2 - PCV13) 07/18/2007  . INFLUENZA VACCINE  09/23/2019   No flowsheet data found.   Vitals:   03/20/20 1638  BP: 136/68  Pulse: 62  Resp: 16  Temp: 97.8 F (36.6 C)  TempSrc: Oral  Weight: 160 lb 3.2 oz (72.7 kg)  Height: 5\' 3"  (1.6 m)   Body mass index is 28.38 kg/m.  Physical Exam  GENERAL APPEARANCE: Well nourished. In no acute distress.  SKIN:  Skin is warm and dry.  MOUTH and THROAT: Lips are without lesions. Oral mucosa is moist and without lesions. Tongue is normal in shape, size, and color and without lesions RESPIRATORY: Breathing is even & unlabored, BS CTAB CARDIAC: RRR, no murmur,no extra heart sounds, no edema GI: Abdomen soft, normal BS, no masses, no tenderness EXTREMITIES:  Able to move X 4 extremities NEUROLOGICAL: There is no tremor. Speech is clear.  PSYCHIATRIC:  Affect and behavior are appropriate  Labs reviewed: Recent Labs    10/09/19 0311 10/10/19 0739 03/01/20 0825 03/02/20 0651 03/03/20 0308  NA 140   < > 135 137 139  K 3.4*   < >  3.6 3.6 3.5  CL 105   < > 99 102 107  CO2 25   < > 20* 24 24  GLUCOSE 90   < > 96 93 101*  BUN 10   < > 25* 30* 22  CREATININE 0.74   < > 1.44* 1.41* 1.18*  CALCIUM 9.5   < > 9.5 9.0 8.4*  MG 2.0  --   --   --   --    < > = values in this interval not displayed.   Recent Labs    02/29/20 2340 03/02/20 0651 03/03/20 0308  AST 65* 45* 34  ALT 15 13 11   ALKPHOS 57 38 35*  BILITOT 1.6* 1.0 0.7  PROT 8.8* 6.3* 5.5*  ALBUMIN 4.8 3.6 3.2*   Recent Labs    10/09/19 0311 10/10/19 0739 10/11/19 0823 02/29/20 2010 02/29/20 2357 03/02/20 0651  WBC 3.2* 3.8* 4.4 4.5  --  4.5  NEUTROABS 1.5* 1.9 2.2  --   --   --   HGB 12.4 12.9 12.9 15.9* 19.4* 13.0  HCT 40.8 42.5 41.7 53.5* 57.0* 43.4  MCV 83.4 83.3 82.6 84.1  --  82.4  PLT 222 228 248 233  --   241   Lab Results  Component Value Date   TSH 2.662 02/29/2020   Lab Results  Component Value Date   HGBA1C 5.4 10/09/2019   Lab Results  Component Value Date   CHOL 158 10/09/2019   HDL 50 10/09/2019   LDLCALC 94 10/09/2019   TRIG 107 03/01/2020   CHOLHDL 3.2 10/09/2019    Significant Diagnostic Results in last 30 days:  CT Head Wo Contrast  Result Date: 02/29/2020 CLINICAL DATA:  Neck trauma, intoxicated or obtunded (Age >= 16y) fall, unknown downtime EXAM: CT HEAD WITHOUT CONTRAST CT CERVICAL SPINE WITHOUT CONTRAST TECHNIQUE: Multidetector CT imaging of the head and cervical spine was performed following the standard protocol without intravenous contrast. Multiplanar CT image reconstructions of the cervical spine were also generated. COMPARISON:  CT head and cervical spine 10/22/2018. FINDINGS: CT HEAD FINDINGS Brain: Patchy and confluent areas of decreased attenuation are noted throughout the deep and periventricular white matter of the cerebral hemispheres bilaterally, compatible with chronic microvascular ischemic disease. Similar-appearing right occipital encephalomalacia. No evidence of large-territorial acute infarction. No parenchymal hemorrhage. No mass lesion. No extra-axial collection. No mass effect or midline shift. No hydrocephalus. Basilar cisterns are patent. Vascular: No hyperdense vessel. Skull: No acute fracture or focal lesion. Sinuses/Orbits: Paranasal sinuses and mastoid air cells are clear. The orbits are unremarkable. Other: None. CT CERVICAL SPINE FINDINGS Alignment: Head rotation.  Normal. Skull base and vertebrae: Multilevel degenerative changes of the spine worse at the C5-C6 level. No acute fracture. No aggressive appearing focal osseous lesion or focal pathologic process. Soft tissues and spinal canal: No prevertebral fluid or swelling. No visible canal hematoma. Disc levels:  Maintained. Upper chest: Unremarkable. Other: None. IMPRESSION: 1. No acute  intracranial abnormality. 2. No acute displaced fracture or traumatic listhesis of the cervical spine. Electronically Signed   By: Tish Frederickson M.D.   On: 02/29/2020 22:31   CT Cervical Spine Wo Contrast  Result Date: 02/29/2020 CLINICAL DATA:  Neck trauma, intoxicated or obtunded (Age >= 16y) fall, unknown downtime EXAM: CT HEAD WITHOUT CONTRAST CT CERVICAL SPINE WITHOUT CONTRAST TECHNIQUE: Multidetector CT imaging of the head and cervical spine was performed following the standard protocol without intravenous contrast. Multiplanar CT image reconstructions of the cervical spine were also generated. COMPARISON:  CT head and cervical spine 10/22/2018. FINDINGS: CT HEAD FINDINGS Brain: Patchy and confluent areas of decreased attenuation are noted throughout the deep and periventricular white matter of the cerebral hemispheres bilaterally, compatible with chronic microvascular ischemic disease. Similar-appearing right occipital encephalomalacia. No evidence of large-territorial acute infarction. No parenchymal hemorrhage. No mass lesion. No extra-axial collection. No mass effect or midline shift. No hydrocephalus. Basilar cisterns are patent. Vascular: No hyperdense vessel. Skull: No acute fracture or focal lesion. Sinuses/Orbits: Paranasal sinuses and mastoid air cells are clear. The orbits are unremarkable. Other: None. CT CERVICAL SPINE FINDINGS Alignment: Head rotation.  Normal. Skull base and vertebrae: Multilevel degenerative changes of the spine worse at the C5-C6 level. No acute fracture. No aggressive appearing focal osseous lesion or focal pathologic process. Soft tissues and spinal canal: No prevertebral fluid or swelling. No visible canal hematoma. Disc levels:  Maintained. Upper chest: Unremarkable. Other: None. IMPRESSION: 1. No acute intracranial abnormality. 2. No acute displaced fracture or traumatic listhesis of the cervical spine. Electronically Signed   By: Tish Frederickson M.D.   On:  02/29/2020 22:31   MR BRAIN WO CONTRAST  Result Date: 03/01/2020 CLINICAL DATA:  Fall, found down EXAM: MRI HEAD WITHOUT CONTRAST TECHNIQUE: Multiplanar, multiecho pulse sequences of the brain and surrounding structures were obtained without intravenous contrast. COMPARISON:  10/08/2019 FINDINGS: Brain: There is no acute infarction. Mild diffusion hyperintensity seen the left centrum semiovale and right corona radiata reflecting T2 shine through. Patchy and confluent areas of T2 hyperintensity in the supratentorial white matter are nonspecific but likely reflects stable chronic microvascular ischemic changes. There are chronic small vessel infarcts of the corona radiata, basal ganglia, and thalamus bilaterally as well as the central pons and right cerebellum. There is also a chronic infarct of the right occipital lobe with associated chronic blood products. Unchanged scattered foci of susceptibility hypointensity reflecting chronic microhemorrhages likely secondary to chronic hypertension. There is no intracranial mass or mass effect.  No hydrocephalus. Vascular: Major vessel flow voids at the skull base are preserved. Skull and upper cervical spine: Normal marrow signal is preserved. Sinuses/Orbits: Mild mucosal thickening.  Orbits are unremarkable. Other: Sella is unremarkable. Minimal left mastoid fluid opacification. IMPRESSION: No acute infarction, hemorrhage, or mass. Stable chronic microvascular ischemic changes. Multiple chronic infarcts. Electronically Signed   By: Guadlupe Spanish M.D.   On: 03/01/2020 13:35   DG Pelvis Portable  Result Date: 02/29/2020 CLINICAL DATA:  Post fall, found down after several days. Right hip pain. EXAM: PORTABLE PELVIS 1-2 VIEWS COMPARISON:  None. FINDINGS: Oblique area of sclerosis involving the right femoral neck, better seen on concurrent femur exam. No other evidence of pelvic fracture. Pubic symphysis and sacroiliac joints are congruent. Degenerative change of the  right greater than left sacroiliac joint. Pubic rami are intact. IMPRESSION: Oblique area of sclerosis involving the right femoral neck, better seen on concurrent femur exam. This may represent a nondisplaced fracture versus artifact. Recommend further evaluation with CT or MRI. Electronically Signed   By: Narda Rutherford M.D.   On: 02/29/2020 22:03   CT Hip Right Wo Contrast  Result Date: 03/01/2020 CLINICAL DATA:  Found down after several days, upper thigh pain EXAM: CT OF THE RIGHT HIP WITHOUT CONTRAST TECHNIQUE: Multidetector CT imaging of the right hip was performed according to the standard protocol. Multiplanar CT image reconstructions were also generated. COMPARISON:  CT 01/14/2019 FINDINGS: Bones/Joint/Cartilage No acute fracture or traumatic malalignment is seen of the right hip or included bony pelvis. The proximal  femur is intact with the femoral head normally located within the acetabulum. There is a background moderate right hip osteoarthrosis as well as enthesopathic changes upon the greater trochanter and ischial tuberosity. Additional sclerotic changes are noted at the SI joints, right greater than left, asymmetry of this finding could reflect sequela of prior sacroiliitis though overall the appearance is similar to comparison imaging in 2020. Mild degenerative changes at the pubic symphysis, also stable. No suspicious lytic or blastic lesions. Ligaments Suboptimally assessed by CT. Muscles and Tendons Question some minimal edematous change of the right gluteal musculature possibly related to contusion in the setting of fall though may be accentuated by patient motion artifact towards the superior margins of imaging. No visible intramuscular collection is seen within the limitations of this unenhanced CT. Soft tissues Included portions of the pelvis demonstrate extensive atherosclerotic calcification in the vasculature. Pelvic contents are free of acute abnormality. There is some mild stranding  and edematous changes of the right hip, could reflect contusive change or some more generalized soft tissue edema. No soft tissue gas foreign body. No large hematoma. IMPRESSION: 1. No acute fracture or traumatic malalignment of the right hip or included bony pelvis. 2. Mild soft tissue stranding and edematous changes of the lateral right hip, could reflect contusive change or some more generalized soft tissue edema. Some minimal edematous changes of the right gluteal musculature as well. No large hematoma. No soft tissue gas foreign body. 3. Aortic Atherosclerosis (ICD10-I70.0). Electronically Signed   By: Kreg Shropshire M.D.   On: 03/01/2020 00:50   DG Chest Portable 1 View  Result Date: 02/29/2020 CLINICAL DATA:  Post fall, found down after several days. Rhonchi on the right. EXAM: PORTABLE CHEST 1 VIEW COMPARISON:  10/08/2019 FINDINGS: Low lung volumes. Borderline cardiomegaly, unchanged. Stable mediastinal contours with aortic atherosclerosis. No pulmonary edema. No visualized pneumothorax or pleural effusion. No acute osseous abnormalities are seen. Cervical collar in place. IMPRESSION: Low lung volumes without acute process. Electronically Signed   By: Narda Rutherford M.D.   On: 02/29/2020 22:00   DG Abd Portable 1 View  Result Date: 03/01/2020 CLINICAL DATA:  MRI screen EXAM: PORTABLE ABDOMEN - 1 VIEW COMPARISON:  February 29, 2020 FINDINGS: Air-filled nondilated loops of bowel. Severe degenerative changes of the RIGHT hip. Limited evaluation of the sacrum secondary to overlying bowel gas. There is an 18 mm circular metallic density projecting over the LEFT upper abdomen. Atherosclerotic calcifications of the aorta. Two calcific densities project over the LEFT hemiabdomen, unchanged and consistent with the sequela of fat necrosis. IMPRESSION: 1. 18 mm circular metallic density projecting over the LEFT upper abdomen. Recommend correlation with physical exam to determine whether this is external to patient.  Electronically Signed   By: Meda Klinefelter MD   On: 03/01/2020 12:32   DG Hip Unilat W or Wo Pelvis 2-3 Views Left  Result Date: 02/29/2020 CLINICAL DATA:  Fall several days ago with pain. EXAM: DG HIP (WITH OR WITHOUT PELVIS) 2-3V LEFT COMPARISON:  None. FINDINGS: Degenerative changes of the right hip with osteophytes. No left hip fracture identified. No dislocation. Vascular calcifications in the femoral arteries bilaterally. IMPRESSION: Degenerative changes. Vascular calcifications in the bilateral femoral arteries. No left hip fracture identified. Electronically Signed   By: Gerome Sam III M.D   On: 02/29/2020 19:55   DG Femur Min 2 Views Right  Result Date: 02/29/2020 CLINICAL DATA:  Post fall, found down after several days. EXAM: RIGHT FEMUR 2 VIEWS COMPARISON:  None.  FINDINGS: Oblique area of sclerosis involving the femoral neck may represent an impaction fracture versus overlapping osteophytes. Femoral head is well seated in the acetabulum. More distal femur is intact. Knee alignment is maintained. There are advanced vascular calcifications. IMPRESSION: Oblique area of sclerosis involving the femoral neck may represent an impaction fracture versus overlapping osteophytes. Recommend further evaluation with CT or MRI. Electronically Signed   By: Narda Rutherford M.D.   On: 02/29/2020 22:02   VAS [place] LOWER EXTREMITY VENOUS (DVT)  Result Date: 03/02/2020  Lower Venous DVT Study Indications: Elevated Ddimer.  Risk Factors: COVID 19 positive. Limitations: Body habitus, poor ultrasound/tissue interface and patient positioning. Comparison Study: No prior studies. Performing Technologist: Chanda Busing RVT  Examination Guidelines: A complete evaluation includes B-mode imaging, spectral Doppler, color Doppler, and power Doppler as needed of all accessible portions of each vessel. Bilateral testing is considered an integral part of a complete examination. Limited examinations for reoccurring  indications may be performed as noted. The reflux portion of the exam is performed with the patient in reverse Trendelenburg.  +---------+---------------+---------+-----------+----------+--------------+ RIGHT    CompressibilityPhasicitySpontaneityPropertiesThrombus Aging +---------+---------------+---------+-----------+----------+--------------+ CFV      Full           Yes      Yes                                 +---------+---------------+---------+-----------+----------+--------------+ SFJ      Full                                                        +---------+---------------+---------+-----------+----------+--------------+ FV Prox  Full                                                        +---------+---------------+---------+-----------+----------+--------------+ FV Mid   Full                                                        +---------+---------------+---------+-----------+----------+--------------+ FV DistalFull                                                        +---------+---------------+---------+-----------+----------+--------------+ PFV      Full                                                        +---------+---------------+---------+-----------+----------+--------------+ POP      Full           Yes      Yes                                 +---------+---------------+---------+-----------+----------+--------------+  PTV      Full                                                        +---------+---------------+---------+-----------+----------+--------------+ PERO     Full                                                        +---------+---------------+---------+-----------+----------+--------------+   +---------+---------------+---------+-----------+----------+--------------+ LEFT     CompressibilityPhasicitySpontaneityPropertiesThrombus Aging  +---------+---------------+---------+-----------+----------+--------------+ CFV      Full           Yes      Yes                                 +---------+---------------+---------+-----------+----------+--------------+ SFJ      Full                                                        +---------+---------------+---------+-----------+----------+--------------+ FV Prox  Full                                                        +---------+---------------+---------+-----------+----------+--------------+ FV Mid   Full                                                        +---------+---------------+---------+-----------+----------+--------------+ FV DistalFull                                                        +---------+---------------+---------+-----------+----------+--------------+ PFV      Full                                                        +---------+---------------+---------+-----------+----------+--------------+ POP      Full           Yes      Yes                                 +---------+---------------+---------+-----------+----------+--------------+ PTV      Full                                                        +---------+---------------+---------+-----------+----------+--------------+  PERO     Full                                                        +---------+---------------+---------+-----------+----------+--------------+     Summary: RIGHT: - There is no evidence of deep vein thrombosis in the lower extremity. However, portions of this examination were limited- see technologist comments above.  - No cystic structure found in the popliteal fossa.  LEFT: - There is no evidence of deep vein thrombosis in the lower extremity. However, portions of this examination were limited- see technologist comments above.  - No cystic structure found in the popliteal fossa.  *See table(s) above for measurements and observations.  Electronically signed by Sherald Hess MD on 03/02/2020 at 1:22:09 PM.    Final    ECHOCARDIOGRAM LIMITED  Result Date: 03/01/2020    ECHOCARDIOGRAM LIMITED REPORT   Patient Name:   [name] Date of Exam: 03/01/2020 Medical Rec #:  [id number]   Height:       64.0 in Accession #:    [id number]  Weight:       168.0 lb Date of Birth:  1940/05/30   BSA:          1.817 m Patient Age:    79 years    BP:           143/118 mmHg Patient Gender: F           HR:           81 bpm. Exam Location:  Inpatient Procedure: Limited Echo, Cardiac Doppler and Color Doppler Indications:    Elevated troponin  History:        Patient has prior history of Echocardiogram examinations, most                 recent 10/09/2019. Risk Factors:Dyslipidemia, Former Smoker and                 Hypertension. COVID-19. PVD. H/O stroke.  Sonographer:    Ross Ludwig RDCS (AE) Referring Phys: [id number] [facility] [place]  Sonographer Comments: COVID-19 IMPRESSIONS  1. Left ventricular ejection fraction, by estimation, is 70 to 75%. The left ventricle has hyperdynamic function. There is severe concentric left ventricular hypertrophy. Left ventricular diastolic parameters are consistent with Grade I diastolic dysfunction (impaired relaxation).  2. Right ventricular systolic function is normal. The right ventricular size is normal.  3. The mitral valve is grossly normal. Trivial mitral valve regurgitation.  4. The aortic valve is grossly normal. Aortic valve regurgitation is not visualized. FINDINGS  Left Ventricle: Left ventricular ejection fraction, by estimation, is 70 to 75%. The left ventricle has hyperdynamic function. The left ventricular internal cavity size was normal in size. There is severe concentric left ventricular hypertrophy. Left ventricular diastolic parameters are consistent with Grade I diastolic dysfunction (impaired relaxation). Right Ventricle: The right ventricular size is normal. Right ventricular systolic function is normal. Mitral  Valve: The mitral valve is grossly normal. Mild to moderate mitral annular calcification. Trivial mitral valve regurgitation. Aortic Valve: The aortic valve is grossly normal. Aortic valve regurgitation is not visualized. Aortic valve mean gradient measures 4.0 mmHg. Aortic valve peak gradient measures 8.5 mmHg. Aortic valve area, by VTI measures 3.78 cm. LEFT VENTRICLE PLAX 2D LVIDd:         4.00 cm  Diastology LVIDs:         2.80 cm  LV e' medial:    6.09 cm/s LV PW:         2.00 cm  LV E/e' medial:  6.2 LV IVS:        2.00 cm  LV e' lateral:   6.09 cm/s LVOT diam:     2.10 cm  LV E/e' lateral: 6.2 LV SV:         75 LV SV Index:   41 LVOT Area:     3.46 cm  IVC IVC diam: 1.00 cm LEFT ATRIUM         Index LA diam:    3.60 cm 1.98 cm/m  AORTIC VALVE AV Area (Vmax):    3.32 cm AV Area (Vmean):   2.66 cm AV Area (VTI):     3.78 cm AV Vmax:           146.00 cm/s AV Vmean:          97.900 cm/s AV VTI:            0.198 m AV Peak Grad:      8.5 mmHg AV Mean Grad:      4.0 mmHg LVOT Vmax:         140.00 cm/s LVOT Vmean:        75.300 cm/s LVOT VTI:          0.216 m LVOT/AV VTI ratio: 1.09  AORTA Ao Root diam: 3.50 cm MITRAL VALVE MV Area (PHT): 4.68 cm    SHUNTS MV Decel Time: 162 msec    Systemic VTI:  0.22 m MV E velocity: 37.70 cm/s  Systemic Diam: 2.10 cm MV A velocity: 65.60 cm/s MV E/A ratio:  0.57 Kristeen MissPhilip Nahser MD Electronically signed by Kristeen MissPhilip Nahser MD Signature Date/Time: 03/01/2020/3:25:36 PM    Final     Assessment/Plan  1. Primary hypertension -  BPs stable, continue Norvasc and metoprolol tartrate -   Monitor BPs  2. History of CVA (cerebrovascular accident) -   Stable, continue Plavix, Zetia and Lipitor -   Has left-sided weakness, continue PT and OT for therapeutic strengthening exercises  3. OAB (overactive bladder) -    Stable, continue Myrbetriq  4. Depression, major, recurrent, mild (HCC) -    Mood is stable, continue Wellbutrin XL and Lexapro     Family/ staff Communication:  Discussed plan of care with resident and charge nurse.  Labs/tests ordered: None  Goals of care:   Short-term care   Kenard GowerMonina Medina-Vargas, DNP, MSN, FNP-BC [facility] [phone] (Monday-Friday 8:00 a.m. - 5:00 p.m.) [phone] (after hours)

## 2020-03-24 ENCOUNTER — Non-Acute Institutional Stay (SKILLED_NURSING_FACILITY): Payer: Medicare Other | Admitting: Adult Health

## 2020-03-24 ENCOUNTER — Encounter: Payer: Self-pay | Admitting: Adult Health

## 2020-03-24 DIAGNOSIS — T796XXD Traumatic ischemia of muscle, subsequent encounter: Secondary | ICD-10-CM | POA: Diagnosis not present

## 2020-03-24 DIAGNOSIS — U071 COVID-19: Secondary | ICD-10-CM

## 2020-03-24 DIAGNOSIS — Z8673 Personal history of transient ischemic attack (TIA), and cerebral infarction without residual deficits: Secondary | ICD-10-CM

## 2020-03-24 DIAGNOSIS — N3281 Overactive bladder: Secondary | ICD-10-CM

## 2020-03-24 DIAGNOSIS — I10 Essential (primary) hypertension: Secondary | ICD-10-CM | POA: Diagnosis not present

## 2020-03-24 DIAGNOSIS — F330 Major depressive disorder, recurrent, mild: Secondary | ICD-10-CM

## 2020-03-24 NOTE — Progress Notes (Unsigned)
Location:  [facility] Room Number: 202-A Place of Service:  SNF (31) Provider:  Kenard Gower, DNP, FNP-BC  Patient Care Team: Martha Clan, MD as PCP - General (Internal Medicine)  Extended Emergency Contact Information Primary Emergency Contact: Margarita Grizzle Home Phone: [phone] Mobile Phone: [phone] Relation: Sister Preferred language: English Interpreter needed? No Secondary Emergency Contact: McCollum,[name] Address: [address]          [place], [place] Home Phone: [phone] Work Phone: [phone] Mobile Phone: [phone] Relation: Daughter  Code Status:  FULL CODE  Goals of care: Advanced Directive information Advanced Directives 03/02/2020  Does Patient Have a Medical Advance Directive? No  Type of Advance Directive -  Does patient want to make changes to medical advance directive? -  Would patient like information on creating a medical advance directive? No - Patient declined  Some encounter information is confidential and restricted. Go to Review Flowsheets activity to see all data.     Chief Complaint  Patient presents with  . Discharge Note    Patient is seen for discharge from SNF    HPI:  Pt is a 80 y.o. female who is for discharge home on 0n 03/25/20 with Home health PT and OT.  She was admitted to [facility] on 03/06/2020 post hospitalization 02/29/20 to 03/06/2020.  She presented to the emergency department after being found down alone at home, reportedly on the floor for 3 days.  She was admitted for acute rhabdomyolysis secondary to prolonged immobility, status post fall.  She had hypotension secondary to dehydration, left-sided weakness noted.  She had history of stroke August 2021 and MRI negative for acute process.  She was incidentally found to be COVID-19 positive.  Chest x-ray was clear and CRP was unremarkable.  She has a PMH of essential  hypertension, dyslipidemia, depression and DJD.  Patient was admitted to this facility for short-term rehabilitation after the patient's recent hospitalization.  Patient has completed SNF rehabilitation and therapy has cleared the patient for discharge.   Past Medical History:  Diagnosis Date  . Arthritis   . Depression   . High cholesterol   . Hypertension   . Pneumonia   . Stroke [facility])    1994 no residuals   Past Surgical History:  Procedure Laterality Date  . ABDOMINAL HYSTERECTOMY    . catheterization of the left superficial femoral artery  10/20/2009  . SHOULDER ARTHROSCOPY WITH ROTATOR CUFF REPAIR AND SUBACROMIAL DECOMPRESSION Left 01/27/2018   Procedure: LEFT SHOULDER ARTHROSCOPY WITH DEBRIDEMENT, SUBACROMIAL DECOMPRESSION AND ROTATOR CUFF REPAIR;  Surgeon: Kathryne Hitch, MD;  Location: [facility] ORS;  Service: Orthopedics;  Laterality: Left;  . SHOULDER SURGERY     Left with dr. Gayla Doss arthroscopy with possible rotator cuff repair  . TONSILLECTOMY      Allergies  Allergen Reactions  . Penicillins Swelling and Other (See Comments)    Has patient had a PCN reaction causing immediate rash, facial/tongue/throat swelling, SOB or lightheadedness with hypotension: Yes Has patient had a PCN reaction causing severe rash involving mucus membranes or skin necrosis: No Has patient had a PCN reaction that required hospitalization: No Has patient had a PCN reaction occurring within the last 10 years: No If all of the above answers are "NO", then may proceed with Cephalosporin use.   . Shellfish Allergy Itching and Swelling  . Zoloft [Sertraline] Rash    Outpatient Encounter Medications as of 03/24/2020  Medication Sig  . albuterol (VENTOLIN  HFA) 108 (90 Base) MCG/ACT inhaler Inhale 2 puffs into the lungs every 6 (six) hours as needed for wheezing or shortness of breath.   Marland Kitchen amLODipine (NORVASC) 10 MG tablet Take 10 mg by mouth daily.  Marland Kitchen atorvastatin (LIPITOR) 10 MG tablet Take  10 mg by mouth daily.  . bisacodyl (DULCOLAX) 10 MG suppository Place 10 mg rectally as needed for moderate constipation.  Marland Kitchen buPROPion (WELLBUTRIN XL) 150 MG 24 hr tablet Take 150 mg by mouth daily.  . Calcium Carbonate-Vitamin D (CALCIUM 600+D HIGH POTENCY PO) Take 1 tablet by mouth daily.   . Cholecalciferol (VITAMIN D) 2000 UNITS tablet Take 2,000 Units by mouth daily.  . clopidogrel (PLAVIX) 75 MG tablet Take 1 tablet (75 mg total) by mouth daily.  Marland Kitchen escitalopram (LEXAPRO) 10 MG tablet 1  qam  . ezetimibe (ZETIA) 10 MG tablet Take 10 mg by mouth daily.  . magnesium hydroxide (MILK OF MAGNESIA) 400 MG/5ML suspension Take 30 mLs by mouth daily as needed for mild constipation.  . metoprolol tartrate (LOPRESSOR) 50 MG tablet Take 1 tablet (50 mg total) by mouth daily.  . mirabegron ER (MYRBETRIQ) 25 MG TB24 tablet Take 25 mg by mouth daily.  . pantoprazole (PROTONIX) 20 MG tablet Take 20 mg by mouth daily.  . Sodium Phosphates (RA SALINE ENEMA RE) Place 1 each rectally daily as needed.   No facility-administered encounter medications on file as of 03/24/2020.    Review of Systems  GENERAL: No change in appetite, no fatigue, no weight changes, no fever, chills or weakness MOUTH and THROAT: Denies oral discomfort, gingival pain or bleeding RESPIRATORY: no cough, SOB, DOE, wheezing, hemoptysis CARDIAC: No chest pain or palpitations GI: No abdominal pain, diarrhea, constipation, heart burn, nausea or vomiting GU: Denies dysuria, frequency, hematuria or discharge NEUROLOGICAL: Denies dizziness, syncope, numbness, or headache PSYCHIATRIC: Denies feelings of depression or anxiety. No report of hallucinations, insomnia, paranoia, or agitation   Immunization History  Administered Date(s) Administered  . Influenza, High Dose Seasonal PF 11/09/2016, 12/01/2018  . PFIZER(Purple Top)SARS-COV-2 Vaccination 04/28/2019, 05/18/2019  . Pneumococcal Polysaccharide-23 05/24/1998  . Td 03/26/1999    Pertinent  Health Maintenance Due  Topic Date Due  . DEXA SCAN  Never done  . PNA vac Low Risk Adult (1 of 2 - PCV13) 07/18/2007  . INFLUENZA VACCINE  09/23/2019    Vitals:   03/24/20 1251  BP: 135/70  Pulse: 72  Resp: 16  Temp: (!) 96.8 F (36 C)  TempSrc: Oral  Weight: 160 lb 3.2 oz (72.7 kg)  Height: 5\' 3"  (1.6 m)   Body mass index is 28.38 kg/m.  Physical Exam  GENERAL APPEARANCE: Well nourished. In no acute distress. SKIN:  Skin is warm and dry.  MOUTH and THROAT: Lips are without lesions. Oral mucosa is moist and without lesions. Tongue is normal in shape, size, and color and without lesions RESPIRATORY: Breathing is even & unlabored, BS CTAB CARDIAC: RRR, no murmur,no extra heart sounds, no edema GI: Abdomen soft, normal BS, no masses, no tenderness EXTREMITIES:  Able to move X 4 extremities NEUROLOGICAL: There is no tremor. Speech is clear.  Left-sided weakness PSYCHIATRIC:  Affect and behavior are appropriate  Labs reviewed: Recent Labs    10/09/19 0311 10/10/19 0739 03/01/20 0825 03/02/20 0651 03/03/20 0308 03/13/20 0000  NA 140   < > 135 137 139 142  K 3.4*   < > 3.6 3.6 3.5 4.4  CL 105   < > 99 102 107  107  CO2 25   < > 20* 24 24 24*  GLUCOSE 90   < > 96 93 101*  --   BUN 10   < > 25* 30* 22 14  CREATININE 0.74   < > 1.44* 1.41* 1.18* 0.9  CALCIUM 9.5   < > 9.5 9.0 8.4* 9.3  MG 2.0  --   --   --   --   --    < > = values in this interval not displayed.   Recent Labs    02/29/20 2340 03/02/20 0651 03/03/20 0308 03/13/20 0000  AST 65* 45* 34 11*  ALT 15 13 11  5*  ALKPHOS 57 38 35* 56  BILITOT 1.6* 1.0 0.7  --   PROT 8.8* 6.3* 5.5*  --   ALBUMIN 4.8 3.6 3.2* 3.8   Recent Labs    10/10/19 0739 10/11/19 0823 02/29/20 2010 02/29/20 2357 03/02/20 0651 03/13/20 0000  WBC 3.8* 4.4 4.5  --  4.5 5.4  NEUTROABS 1.9 2.2  --   --   --  3.60  HGB 12.9 12.9 15.9* 19.4* 13.0 11.4*  HCT 42.5 41.7 53.5* 57.0* 43.4 35*  MCV 83.3 82.6 84.1  --   82.4  --   PLT 228 248 233  --  241 437*   Lab Results  Component Value Date   TSH 2.662 02/29/2020   Lab Results  Component Value Date   HGBA1C 5.4 10/09/2019   Lab Results  Component Value Date   CHOL 158 10/09/2019   HDL 50 10/09/2019   LDLCALC 94 10/09/2019   TRIG 107 03/01/2020   CHOLHDL 3.2 10/09/2019    Significant Diagnostic Results in last 30 days:  CT Head Wo Contrast  Result Date: 02/29/2020 CLINICAL DATA:  Neck trauma, intoxicated or obtunded (Age >= 16y) fall, unknown downtime EXAM: CT HEAD WITHOUT CONTRAST CT CERVICAL SPINE WITHOUT CONTRAST TECHNIQUE: Multidetector CT imaging of the head and cervical spine was performed following the standard protocol without intravenous contrast. Multiplanar CT image reconstructions of the cervical spine were also generated. COMPARISON:  CT head and cervical spine 10/22/2018. FINDINGS: CT HEAD FINDINGS Brain: Patchy and confluent areas of decreased attenuation are noted throughout the deep and periventricular white matter of the cerebral hemispheres bilaterally, compatible with chronic microvascular ischemic disease. Similar-appearing right occipital encephalomalacia. No evidence of large-territorial acute infarction. No parenchymal hemorrhage. No mass lesion. No extra-axial collection. No mass effect or midline shift. No hydrocephalus. Basilar cisterns are patent. Vascular: No hyperdense vessel. Skull: No acute fracture or focal lesion. Sinuses/Orbits: Paranasal sinuses and mastoid air cells are clear. The orbits are unremarkable. Other: None. CT CERVICAL SPINE FINDINGS Alignment: Head rotation.  Normal. Skull base and vertebrae: Multilevel degenerative changes of the spine worse at the C5-C6 level. No acute fracture. No aggressive appearing focal osseous lesion or focal pathologic process. Soft tissues and spinal canal: No prevertebral fluid or swelling. No visible canal hematoma. Disc levels:  Maintained. Upper chest: Unremarkable. Other:  None. IMPRESSION: 1. No acute intracranial abnormality. 2. No acute displaced fracture or traumatic listhesis of the cervical spine. Electronically Signed   By: Tish Frederickson M.D.   On: 02/29/2020 22:31   CT Cervical Spine Wo Contrast  Result Date: 02/29/2020 CLINICAL DATA:  Neck trauma, intoxicated or obtunded (Age >= 16y) fall, unknown downtime EXAM: CT HEAD WITHOUT CONTRAST CT CERVICAL SPINE WITHOUT CONTRAST TECHNIQUE: Multidetector CT imaging of the head and cervical spine was performed following the standard protocol without intravenous contrast.  Multiplanar CT image reconstructions of the cervical spine were also generated. COMPARISON:  CT head and cervical spine 10/22/2018. FINDINGS: CT HEAD FINDINGS Brain: Patchy and confluent areas of decreased attenuation are noted throughout the deep and periventricular white matter of the cerebral hemispheres bilaterally, compatible with chronic microvascular ischemic disease. Similar-appearing right occipital encephalomalacia. No evidence of large-territorial acute infarction. No parenchymal hemorrhage. No mass lesion. No extra-axial collection. No mass effect or midline shift. No hydrocephalus. Basilar cisterns are patent. Vascular: No hyperdense vessel. Skull: No acute fracture or focal lesion. Sinuses/Orbits: Paranasal sinuses and mastoid air cells are clear. The orbits are unremarkable. Other: None. CT CERVICAL SPINE FINDINGS Alignment: Head rotation.  Normal. Skull base and vertebrae: Multilevel degenerative changes of the spine worse at the C5-C6 level. No acute fracture. No aggressive appearing focal osseous lesion or focal pathologic process. Soft tissues and spinal canal: No prevertebral fluid or swelling. No visible canal hematoma. Disc levels:  Maintained. Upper chest: Unremarkable. Other: None. IMPRESSION: 1. No acute intracranial abnormality. 2. No acute displaced fracture or traumatic listhesis of the cervical spine. Electronically Signed   By:  Tish Frederickson M.D.   On: 02/29/2020 22:31   MR BRAIN WO CONTRAST  Result Date: 03/01/2020 CLINICAL DATA:  Fall, found down EXAM: MRI HEAD WITHOUT CONTRAST TECHNIQUE: Multiplanar, multiecho pulse sequences of the brain and surrounding structures were obtained without intravenous contrast. COMPARISON:  10/08/2019 FINDINGS: Brain: There is no acute infarction. Mild diffusion hyperintensity seen the left centrum semiovale and right corona radiata reflecting T2 shine through. Patchy and confluent areas of T2 hyperintensity in the supratentorial white matter are nonspecific but likely reflects stable chronic microvascular ischemic changes. There are chronic small vessel infarcts of the corona radiata, basal ganglia, and thalamus bilaterally as well as the central pons and right cerebellum. There is also a chronic infarct of the right occipital lobe with associated chronic blood products. Unchanged scattered foci of susceptibility hypointensity reflecting chronic microhemorrhages likely secondary to chronic hypertension. There is no intracranial mass or mass effect.  No hydrocephalus. Vascular: Major vessel flow voids at the skull base are preserved. Skull and upper cervical spine: Normal marrow signal is preserved. Sinuses/Orbits: Mild mucosal thickening.  Orbits are unremarkable. Other: Sella is unremarkable. Minimal left mastoid fluid opacification. IMPRESSION: No acute infarction, hemorrhage, or mass. Stable chronic microvascular ischemic changes. Multiple chronic infarcts. Electronically Signed   By: Guadlupe Spanish M.D.   On: 03/01/2020 13:35   DG Pelvis Portable  Result Date: 02/29/2020 CLINICAL DATA:  Post fall, found down after several days. Right hip pain. EXAM: PORTABLE PELVIS 1-2 VIEWS COMPARISON:  None. FINDINGS: Oblique area of sclerosis involving the right femoral neck, better seen on concurrent femur exam. No other evidence of pelvic fracture. Pubic symphysis and sacroiliac joints are congruent.  Degenerative change of the right greater than left sacroiliac joint. Pubic rami are intact. IMPRESSION: Oblique area of sclerosis involving the right femoral neck, better seen on concurrent femur exam. This may represent a nondisplaced fracture versus artifact. Recommend further evaluation with CT or MRI. Electronically Signed   By: Narda Rutherford M.D.   On: 02/29/2020 22:03   CT Hip Right Wo Contrast  Result Date: 03/01/2020 CLINICAL DATA:  Found down after several days, upper thigh pain EXAM: CT OF THE RIGHT HIP WITHOUT CONTRAST TECHNIQUE: Multidetector CT imaging of the right hip was performed according to the standard protocol. Multiplanar CT image reconstructions were also generated. COMPARISON:  CT 01/14/2019 FINDINGS: Bones/Joint/Cartilage No acute fracture or traumatic  malalignment is seen of the right hip or included bony pelvis. The proximal femur is intact with the femoral head normally located within the acetabulum. There is a background moderate right hip osteoarthrosis as well as enthesopathic changes upon the greater trochanter and ischial tuberosity. Additional sclerotic changes are noted at the SI joints, right greater than left, asymmetry of this finding could reflect sequela of prior sacroiliitis though overall the appearance is similar to comparison imaging in 2020. Mild degenerative changes at the pubic symphysis, also stable. No suspicious lytic or blastic lesions. Ligaments Suboptimally assessed by CT. Muscles and Tendons Question some minimal edematous change of the right gluteal musculature possibly related to contusion in the setting of fall though may be accentuated by patient motion artifact towards the superior margins of imaging. No visible intramuscular collection is seen within the limitations of this unenhanced CT. Soft tissues Included portions of the pelvis demonstrate extensive atherosclerotic calcification in the vasculature. Pelvic contents are free of acute abnormality.  There is some mild stranding and edematous changes of the right hip, could reflect contusive change or some more generalized soft tissue edema. No soft tissue gas foreign body. No large hematoma. IMPRESSION: 1. No acute fracture or traumatic malalignment of the right hip or included bony pelvis. 2. Mild soft tissue stranding and edematous changes of the lateral right hip, could reflect contusive change or some more generalized soft tissue edema. Some minimal edematous changes of the right gluteal musculature as well. No large hematoma. No soft tissue gas foreign body. 3. Aortic Atherosclerosis (ICD10-I70.0). Electronically Signed   By: Kreg Shropshire M.D.   On: 03/01/2020 00:50   DG Chest Portable 1 View  Result Date: 02/29/2020 CLINICAL DATA:  Post fall, found down after several days. Rhonchi on the right. EXAM: PORTABLE CHEST 1 VIEW COMPARISON:  10/08/2019 FINDINGS: Low lung volumes. Borderline cardiomegaly, unchanged. Stable mediastinal contours with aortic atherosclerosis. No pulmonary edema. No visualized pneumothorax or pleural effusion. No acute osseous abnormalities are seen. Cervical collar in place. IMPRESSION: Low lung volumes without acute process. Electronically Signed   By: Narda Rutherford M.D.   On: 02/29/2020 22:00   DG Abd Portable 1 View  Result Date: 03/01/2020 CLINICAL DATA:  MRI screen EXAM: PORTABLE ABDOMEN - 1 VIEW COMPARISON:  February 29, 2020 FINDINGS: Air-filled nondilated loops of bowel. Severe degenerative changes of the RIGHT hip. Limited evaluation of the sacrum secondary to overlying bowel gas. There is an 18 mm circular metallic density projecting over the LEFT upper abdomen. Atherosclerotic calcifications of the aorta. Two calcific densities project over the LEFT hemiabdomen, unchanged and consistent with the sequela of fat necrosis. IMPRESSION: 1. 18 mm circular metallic density projecting over the LEFT upper abdomen. Recommend correlation with physical exam to determine whether  this is external to patient. Electronically Signed   By: Meda Klinefelter MD   On: 03/01/2020 12:32   DG Hip Unilat W or Wo Pelvis 2-3 Views Left  Result Date: 02/29/2020 CLINICAL DATA:  Fall several days ago with pain. EXAM: DG HIP (WITH OR WITHOUT PELVIS) 2-3V LEFT COMPARISON:  None. FINDINGS: Degenerative changes of the right hip with osteophytes. No left hip fracture identified. No dislocation. Vascular calcifications in the femoral arteries bilaterally. IMPRESSION: Degenerative changes. Vascular calcifications in the bilateral femoral arteries. No left hip fracture identified. Electronically Signed   By: Gerome Sam III M.D   On: 02/29/2020 19:55   DG Femur Min 2 Views Right  Result Date: 02/29/2020 CLINICAL DATA:  Post fall,  found down after several days. EXAM: RIGHT FEMUR 2 VIEWS COMPARISON:  None. FINDINGS: Oblique area of sclerosis involving the femoral neck may represent an impaction fracture versus overlapping osteophytes. Femoral head is well seated in the acetabulum. More distal femur is intact. Knee alignment is maintained. There are advanced vascular calcifications. IMPRESSION: Oblique area of sclerosis involving the femoral neck may represent an impaction fracture versus overlapping osteophytes. Recommend further evaluation with CT or MRI. Electronically Signed   By: Narda RutherfordMelanie  Sanford M.D.   On: 02/29/2020 22:02   VAS US LOWER EXTREMITY VENOUS (DVT)  Result Date: 03/02/2020  Lower Venous DVT Study Indications: Elevated Ddimer.  Risk Factors: COVID 19 positive. Limitations: Body habitus, poor ultrasound/tissue interface and patient positioning. Comparison Study: No prior studies. Performing Technologist: Chanda BusingGregory Collins RVT  Examination Guidelines: A complete evaluation includes B-mode imaging, spectral Doppler, color Doppler, and power Doppler as needed of all accessible portions of each vessel. Bilateral testing is considered an integral part of a complete examination. Limited  examinations for reoccurring indications may be performed as noted. The reflux portion of the exam is performed with the patient in reverse Trendelenburg.  +---------+---------------+---------+-----------+----------+--------------+ RIGHT    CompressibilityPhasicitySpontaneityPropertiesThrombus Aging +---------+---------------+---------+-----------+----------+--------------+ CFV      Full           Yes      Yes                                 +---------+---------------+---------+-----------+----------+--------------+ SFJ      Full                                                        +---------+---------------+---------+-----------+----------+--------------+ FV Prox  Full                                                        +---------+---------------+---------+-----------+----------+--------------+ FV Mid   Full                                                        +---------+---------------+---------+-----------+----------+--------------+ FV DistalFull                                                        +---------+---------------+---------+-----------+----------+--------------+ PFV      Full                                                        +---------+---------------+---------+-----------+----------+--------------+ POP      Full           Yes      Yes                                 +---------+---------------+---------+-----------+----------+--------------+  PTV      Full                                                        +---------+---------------+---------+-----------+----------+--------------+ PERO     Full                                                        +---------+---------------+---------+-----------+----------+--------------+   +---------+---------------+---------+-----------+----------+--------------+ LEFT     CompressibilityPhasicitySpontaneityPropertiesThrombus Aging  +---------+---------------+---------+-----------+----------+--------------+ CFV      Full           Yes      Yes                                 +---------+---------------+---------+-----------+----------+--------------+ SFJ      Full                                                        +---------+---------------+---------+-----------+----------+--------------+ FV Prox  Full                                                        +---------+---------------+---------+-----------+----------+--------------+ FV Mid   Full                                                        +---------+---------------+---------+-----------+----------+--------------+ FV DistalFull                                                        +---------+---------------+---------+-----------+----------+--------------+ PFV      Full                                                        +---------+---------------+---------+-----------+----------+--------------+ POP      Full           Yes      Yes                                 +---------+---------------+---------+-----------+----------+--------------+ PTV      Full                                                        +---------+---------------+---------+-----------+----------+--------------+  PERO     Full                                                        +---------+---------------+---------+-----------+----------+--------------+     Summary: RIGHT: - There is no evidence of deep vein thrombosis in the lower extremity. However, portions of this examination were limited- see technologist comments above.  - No cystic structure found in the popliteal fossa.  LEFT: - There is no evidence of deep vein thrombosis in the lower extremity. However, portions of this examination were limited- see technologist comments above.  - No cystic structure found in the popliteal fossa.  *See table(s) above for measurements and observations.  Electronically signed by Sherald Hess MD on 03/02/2020 at 1:22:09 PM.    Final    ECHOCARDIOGRAM LIMITED  Result Date: 03/01/2020    ECHOCARDIOGRAM LIMITED REPORT   Patient Name:   Jene E Custer Date of Exam: 03/01/2020 Medical Rec #:  [id number]   Height:       64.0 in Accession #:    [id number]  Weight:       168.0 lb Date of Birth:  1940/05/30   BSA:          1.817 m Patient Age:    79 years    BP:           143/118 mmHg Patient Gender: F           HR:           81 bpm. Exam Location:  Inpatient Procedure: Limited Echo, Cardiac Doppler and Color Doppler Indications:    Elevated troponin  History:        Patient has prior history of Echocardiogram examinations, most                 recent 10/09/2019. Risk Factors:Dyslipidemia, Former Smoker and                 Hypertension. COVID-19. PVD. H/O stroke.  Sonographer:    Ross Ludwig RDCS (AE) Referring Phys: [id number] [facility] [place]  Sonographer Comments: COVID-19 IMPRESSIONS  1. Left ventricular ejection fraction, by estimation, is 70 to 75%. The left ventricle has hyperdynamic function. There is severe concentric left ventricular hypertrophy. Left ventricular diastolic parameters are consistent with Grade I diastolic dysfunction (impaired relaxation).  2. Right ventricular systolic function is normal. The right ventricular size is normal.  3. The mitral valve is grossly normal. Trivial mitral valve regurgitation.  4. The aortic valve is grossly normal. Aortic valve regurgitation is not visualized. FINDINGS  Left Ventricle: Left ventricular ejection fraction, by estimation, is 70 to 75%. The left ventricle has hyperdynamic function. The left ventricular internal cavity size was normal in size. There is severe concentric left ventricular hypertrophy. Left ventricular diastolic parameters are consistent with Grade I diastolic dysfunction (impaired relaxation). Right Ventricle: The right ventricular size is normal. Right ventricular systolic function is normal. Mitral  Valve: The mitral valve is grossly normal. Mild to moderate mitral annular calcification. Trivial mitral valve regurgitation. Aortic Valve: The aortic valve is grossly normal. Aortic valve regurgitation is not visualized. Aortic valve mean gradient measures 4.0 mmHg. Aortic valve peak gradient measures 8.5 mmHg. Aortic valve area, by VTI measures 3.78 cm. LEFT VENTRICLE PLAX 2D LVIDd:         4.00 cm  Diastology LVIDs:         2.80 cm  LV e' medial:    6.09 cm/s LV PW:         2.00 cm  LV E/e' medial:  6.2 LV IVS:        2.00 cm  LV e' lateral:   6.09 cm/s LVOT diam:     2.10 cm  LV E/e' lateral: 6.2 LV SV:         75 LV SV Index:   41 LVOT Area:     3.46 cm  IVC IVC diam: 1.00 cm LEFT ATRIUM         Index LA diam:    3.60 cm 1.98 cm/m  AORTIC VALVE AV Area (Vmax):    3.32 cm AV Area (Vmean):   2.66 cm AV Area (VTI):     3.78 cm AV Vmax:           146.00 cm/s AV Vmean:          97.900 cm/s AV VTI:            0.198 m AV Peak Grad:      8.5 mmHg AV Mean Grad:      4.0 mmHg LVOT Vmax:         140.00 cm/s LVOT Vmean:        75.300 cm/s LVOT VTI:          0.216 m LVOT/AV VTI ratio: 1.09  AORTA Ao Root diam: 3.50 cm MITRAL VALVE MV Area (PHT): 4.68 cm    SHUNTS MV Decel Time: 162 msec    Systemic VTI:  0.22 m MV E velocity: 37.70 cm/s  Systemic Diam: 2.10 cm MV A velocity: 65.60 cm/s MV E/A ratio:  0.57 Kristeen Miss MD Electronically signed by Kristeen Miss MD Signature Date/Time: 03/01/2020/3:25:36 PM    Final     Assessment/Plan  1. Traumatic rhabdomyolysis, subsequent encounter -  resolved  2. Lab test positive for detection of COVID-19 virus -Incidental finding, asymptomatic, chest x-ray was clear and CRP was unremarkable  3. Primary hypertension - amLODipine (NORVASC) 10 MG tablet; Take 1 tablet (10 mg total) by mouth daily.  Dispense: 30 tablet; Refill: 0 - metoprolol tartrate (LOPRESSOR) 50 MG tablet; Take 1 tablet (50 mg total) by mouth daily.  Dispense: 30 tablet; Refill: 0  4. History of CVA  (cerebrovascular accident) - atorvastatin (LIPITOR) 10 MG tablet; Take 1 tablet (10 mg total) by mouth daily.  Dispense: 30 tablet; Refill: 0 - clopidogrel (PLAVIX) 75 MG tablet; Take 1 tablet (75 mg total) by mouth daily.  Dispense: 30 tablet; Refill: 0 - ezetimibe (ZETIA) 10 MG tablet; Take 1 tablet (10 mg total) by mouth daily.  Dispense: 30 tablet; Refill: 0  5. OAB (overactive bladder) - mirabegron ER (MYRBETRIQ) 25 MG TB24 tablet; Take 1 tablet (25 mg total) by mouth daily.  Dispense: 30 tablet; Refill: 0  6. Depression, major, recurrent, mild (HCC) - buPROPion (WELLBUTRIN XL) 150 MG 24 hr tablet; Take 1 tablet (150 mg total) by mouth daily.  Dispense: 30 tablet; Refill: 0 - escitalopram (LEXAPRO) 10 MG tablet; 1  qam  Dispense: 30 tablet; Refill: 0     I have filled out patient's discharge paperwork and e-prescribed medications.  Patient will have home health PT and OT.  DME provided:  3-in-1  Total discharge time: Greater than 30 minutes Greater than 50% was spent in counseling and coordination of care.   Discharge time involved coordination of  the discharge process with Child psychotherapist, nursing staff and therapy department. Medical justification for home health services/DME verified.    Kenard Gower, DNP, MSN, FNP-BC [facility] [phone] (Monday-Friday 8:00 a.m. - 5:00 p.m.) [phone] (after hours)

## 2020-03-25 DIAGNOSIS — U071 COVID-19: Secondary | ICD-10-CM | POA: Diagnosis not present

## 2020-03-25 NOTE — Progress Notes (Incomplete)
Location:  [facility] Room Number: 202-A Place of Service:  SNF (31) Provider:  Kenard GowerMedina-Vargas, Jadin Kagel, DNP, FNP-BC  Patient Care Team: Martha ClanShaw, William, MD as PCP - General (Internal Medicine)  Extended Emergency Contact Information Primary Emergency Contact: [name], [name]: [phone] Mobile Phone: [phone] Relation: Sister Preferred language: English Interpreter needed? No Secondary Emergency Contact: McCollum,[name] Address: [address]          [name] Home Phone: [phone] Work Phone: [phone] Mobile Phone: [phone] Relation: Daughter  Code Status:  Full Code  Goals of care: Advanced Directive information Advanced Directives 03/02/2020  Does Patient Have a Medical Advance Directive? No  Type of Advance Directive -  Does patient want to make changes to medical advance directive? -  Would patient like information on creating a medical advance directive? No - Patient declined  Some encounter information is confidential and restricted. Go to Review Flowsheets activity to see all data.     Chief Complaint  Patient presents with  . Acute Visit    Short-term rehabilitation visit    HPI:  Pt is a 80 y.o. female seen today for medical management of chronic diseases. She is a short-term care resident of [facility].  She has a PMH of stroke with residual left-sided weakness and hypertension.   She was admitted to [facility] on 03/06/20 post hospitalization 02/29/20 to 03/06/20.  She presented to the emergency department after being found down alone at home, reportedly on the floor for 3 days.  She was admitted for acute rhabdomyolysis secondary to prolonged immobility, status post fall.  She had hypotension secondary to dehydration, left-sided weakness noted. She had history of stroke August 2021 and MRI negative for acute process.  She was  incidentally found to be Covid positive.  Chest x-ray was clear and CRP was unremarkable.   Past Medical History:  Diagnosis Date  . Arthritis   . Depression   . High cholesterol   . Hypertension   . Pneumonia   . Stroke [facility])    1994 no residuals   Past Surgical History:  Procedure Laterality Date  . ABDOMINAL HYSTERECTOMY    . catheterization of the left superficial femoral artery  10/20/2009  . SHOULDER ARTHROSCOPY WITH ROTATOR CUFF REPAIR AND SUBACROMIAL DECOMPRESSION Left 01/27/2018   Procedure: LEFT SHOULDER ARTHROSCOPY WITH DEBRIDEMENT, SUBACROMIAL DECOMPRESSION AND ROTATOR CUFF REPAIR;  Surgeon: Kathryne HitchBlackman, Christopher Y, MD;  Location: [facility] ORS;  Service: Orthopedics;  Laterality: Left;  . SHOULDER SURGERY     Left with dr. Gayla DossBalckman arthroscopy with possible rotator cuff repair  . TONSILLECTOMY      Allergies  Allergen Reactions  . Penicillins Swelling and Other (See Comments)    Has patient had a PCN reaction causing immediate rash, facial/tongue/throat swelling, SOB or lightheadedness with hypotension: Yes Has patient had a PCN reaction causing severe rash involving mucus membranes or skin necrosis: No Has patient had a PCN reaction that required hospitalization: No Has patient had a PCN reaction occurring within the last 10 years: No If all of the above answers are "NO", then may proceed with Cephalosporin use.   . Shellfish Allergy Itching and Swelling  . Zoloft [Sertraline] Rash    Outpatient Encounter Medications as of 03/20/2020  Medication Sig  . albuterol (VENTOLIN HFA) 108 (90 Base) MCG/ACT inhaler Inhale 2 puffs into the lungs every 6 (six) hours as needed for wheezing or shortness of breath.   .Marland Kitchen  magnesium hydroxide (MILK OF MAGNESIA) 400 MG/5ML suspension Take 30 mLs by mouth daily as needed for mild constipation.  . Sodium Phosphates (RA SALINE ENEMA RE) Place 1 each rectally daily as needed.  Marland Kitchen amLODipine (NORVASC) 10 MG tablet Take 10 mg by mouth daily.  Marland Kitchen  atorvastatin (LIPITOR) 10 MG tablet Take 10 mg by mouth daily.  . bisacodyl (DULCOLAX) 10 MG suppository Place 10 mg rectally as needed for moderate constipation.  Marland Kitchen buPROPion (WELLBUTRIN XL) 150 MG 24 hr tablet Take 150 mg by mouth daily.  . Calcium Carbonate-Vitamin D (CALCIUM 600+D HIGH POTENCY PO) Take 1 tablet by mouth daily.   . Cholecalciferol (VITAMIN D) 2000 UNITS tablet Take 2,000 Units by mouth daily.  . clopidogrel (PLAVIX) 75 MG tablet Take 1 tablet (75 mg total) by mouth daily.  Marland Kitchen escitalopram (LEXAPRO) 10 MG tablet 1  qam  . ezetimibe (ZETIA) 10 MG tablet Take 10 mg by mouth daily.  . metoprolol tartrate (LOPRESSOR) 50 MG tablet Take 1 tablet (50 mg total) by mouth daily.  . pantoprazole (PROTONIX) 20 MG tablet Take 20 mg by mouth daily.   No facility-administered encounter medications on file as of 03/20/2020.    Review of Systems  GENERAL: No change in appetite, no fatigue, no weight changes, no fever, chills or weakness SKIN: Denies rash, itching, wounds, ulcer sores, or nail abnormalities EYES: Denies change in vision, dry eyes, eye pain, itching or discharge EARS: Denies change in hearing, ringing in ears, or earache NOSE: Denies nasal congestion or epistaxis MOUTH and THROAT: Denies oral discomfort, gingival pain or bleeding, pain from teeth or hoarseness   RESPIRATORY: no cough, SOB, DOE, wheezing, hemoptysis CARDIAC: No chest pain, edema or palpitations GI: No abdominal pain, diarrhea, constipation, heart burn, nausea or vomiting GU: Denies dysuria, frequency, hematuria, incontinence, or discharge MUSCULOSKELETAL: Denies joint pain, muscle pain, back pain, restricted movement, or unusual weakness CIRCULATION: Denies claudication, edema of legs, varicosities, or cold extremities NEUROLOGICAL: Denies dizziness, syncope, numbness, or headache PSYCHIATRIC: Denies feelings of depression or anxiety. No report of hallucinations, insomnia, paranoia, or agitation ENDOCRINE:  Denies polyphagia, polyuria, polydipsia, heat or cold intolerance HEME/LYMPH: Denies excessive bruising, petechia, enlarged lymph nodes, or bleeding problems IMMUNOLOGIC: Denies history of frequent infections, AIDS, or use of immunosuppressive agents   Immunization History  Administered Date(s) Administered  . Influenza, High Dose Seasonal PF 11/09/2016, 12/01/2018  . Pneumococcal Polysaccharide-23 05/24/1998  . Td 03/26/1999   Pertinent  Health Maintenance Due  Topic Date Due  . DEXA SCAN  Never done  . PNA vac Low Risk Adult (1 of 2 - PCV13) 07/18/2007  . INFLUENZA VACCINE  09/23/2019   No flowsheet data found.   Vitals:   03/20/20 1638  BP: 136/68  Pulse: 62  Resp: 16  Temp: 97.8 F (36.6 C)  TempSrc: Oral  Weight: 160 lb 3.2 oz (72.7 kg)  Height: 5\' 3"  (1.6 m)   Body mass index is 28.38 kg/m.  Physical Exam  GENERAL APPEARANCE: Well nourished. In no acute distress. Normal body habitus SKIN:  Skin is warm and dry. There are no suspicious lesions or rash HEAD: Normal in size and contour. No evidence of trauma EYES: Lids open and close normally. No blepharitis, entropion or ectropion. PERRL. Conjunctivae are clear and sclerae are white. Lenses are without opacity EARS: Pinnae are normal. Patient hears normal voice tunes of the examiner MOUTH and THROAT: Lips are without lesions. Oral mucosa is moist and without lesions. Tongue is normal in  shape, size, and color and without lesions NECK: supple, trachea midline, no neck masses, no thyroid tenderness, no thyromegaly LYMPHATICS: No LAN in the neck, no supraclavicular LAN RESPIRATORY: Breathing is even & unlabored, BS CTAB CARDIAC: RRR, no murmur,no extra heart sounds, no edema GI: Abdomen soft, normal BS, no masses, no tenderness, no hepatomegaly, no splenomegaly MUSCULOSKELETAL: No deformities. Movement at each extremity is full and painless. Strength is 5/5 at each extremity. Back is without kyphosis or  scoliosis CIRCULATION: Pedal pulses are 2+. There is no edema of the legs, ankles and feet NEUROLOGICAL: There is no tremor. Speech is clear PSYCHIATRIC: Alert and oriented X 3. Affect and behavior are appropriate  Labs reviewed: Recent Labs    10/09/19 0311 10/10/19 0739 03/01/20 0825 03/02/20 0651 03/03/20 0308  NA 140   < > 135 137 139  K 3.4*   < > 3.6 3.6 3.5  CL 105   < > 99 102 107  CO2 25   < > 20* 24 24  GLUCOSE 90   < > 96 93 101*  BUN 10   < > 25* 30* 22  CREATININE 0.74   < > 1.44* 1.41* 1.18*  CALCIUM 9.5   < > 9.5 9.0 8.4*  MG 2.0  --   --   --   --    < > = values in this interval not displayed.   Recent Labs    02/29/20 2340 03/02/20 0651 03/03/20 0308  AST 65* 45* 34  ALT 15 13 11   ALKPHOS 57 38 35*  BILITOT 1.6* 1.0 0.7  PROT 8.8* 6.3* 5.5*  ALBUMIN 4.8 3.6 3.2*   Recent Labs    10/09/19 0311 10/10/19 0739 10/11/19 0823 02/29/20 2010 02/29/20 2357 03/02/20 0651  WBC 3.2* 3.8* 4.4 4.5  --  4.5  NEUTROABS 1.5* 1.9 2.2  --   --   --   HGB 12.4 12.9 12.9 15.9* 19.4* 13.0  HCT 40.8 42.5 41.7 53.5* 57.0* 43.4  MCV 83.4 83.3 82.6 84.1  --  82.4  PLT 222 228 248 233  --  241   Lab Results  Component Value Date   TSH 2.662 02/29/2020   Lab Results  Component Value Date   HGBA1C 5.4 10/09/2019   Lab Results  Component Value Date   CHOL 158 10/09/2019   HDL 50 10/09/2019   LDLCALC 94 10/09/2019   TRIG 107 03/01/2020   CHOLHDL 3.2 10/09/2019    Significant Diagnostic Results in last 30 days:  CT Head Wo Contrast  Result Date: 02/29/2020 CLINICAL DATA:  Neck trauma, intoxicated or obtunded (Age >= 16y) fall, unknown downtime EXAM: CT HEAD WITHOUT CONTRAST CT CERVICAL SPINE WITHOUT CONTRAST TECHNIQUE: Multidetector CT imaging of the head and cervical spine was performed following the standard protocol without intravenous contrast. Multiplanar CT image reconstructions of the cervical spine were also generated. COMPARISON:  CT head and cervical  spine 10/22/2018. FINDINGS: CT HEAD FINDINGS Brain: Patchy and confluent areas of decreased attenuation are noted throughout the deep and periventricular white matter of the cerebral hemispheres bilaterally, compatible with chronic microvascular ischemic disease. Similar-appearing right occipital encephalomalacia. No evidence of large-territorial acute infarction. No parenchymal hemorrhage. No mass lesion. No extra-axial collection. No mass effect or midline shift. No hydrocephalus. Basilar cisterns are patent. Vascular: No hyperdense vessel. Skull: No acute fracture or focal lesion. Sinuses/Orbits: Paranasal sinuses and mastoid air cells are clear. The orbits are unremarkable. Other: None. CT CERVICAL SPINE FINDINGS Alignment: Head rotation.  Normal. Skull base and vertebrae: Multilevel degenerative changes of the spine worse at the C5-C6 level. No acute fracture. No aggressive appearing focal osseous lesion or focal pathologic process. Soft tissues and spinal canal: No prevertebral fluid or swelling. No visible canal hematoma. Disc levels:  Maintained. Upper chest: Unremarkable. Other: None. IMPRESSION: 1. No acute intracranial abnormality. 2. No acute displaced fracture or traumatic listhesis of the cervical spine. Electronically Signed   By: Tish Frederickson M.D.   On: 02/29/2020 22:31   CT Cervical Spine Wo Contrast  Result Date: 02/29/2020 CLINICAL DATA:  Neck trauma, intoxicated or obtunded (Age >= 16y) fall, unknown downtime EXAM: CT HEAD WITHOUT CONTRAST CT CERVICAL SPINE WITHOUT CONTRAST TECHNIQUE: Multidetector CT imaging of the head and cervical spine was performed following the standard protocol without intravenous contrast. Multiplanar CT image reconstructions of the cervical spine were also generated. COMPARISON:  CT head and cervical spine 10/22/2018. FINDINGS: CT HEAD FINDINGS Brain: Patchy and confluent areas of decreased attenuation are noted throughout the deep and periventricular white matter  of the cerebral hemispheres bilaterally, compatible with chronic microvascular ischemic disease. Similar-appearing right occipital encephalomalacia. No evidence of large-territorial acute infarction. No parenchymal hemorrhage. No mass lesion. No extra-axial collection. No mass effect or midline shift. No hydrocephalus. Basilar cisterns are patent. Vascular: No hyperdense vessel. Skull: No acute fracture or focal lesion. Sinuses/Orbits: Paranasal sinuses and mastoid air cells are clear. The orbits are unremarkable. Other: None. CT CERVICAL SPINE FINDINGS Alignment: Head rotation.  Normal. Skull base and vertebrae: Multilevel degenerative changes of the spine worse at the C5-C6 level. No acute fracture. No aggressive appearing focal osseous lesion or focal pathologic process. Soft tissues and spinal canal: No prevertebral fluid or swelling. No visible canal hematoma. Disc levels:  Maintained. Upper chest: Unremarkable. Other: None. IMPRESSION: 1. No acute intracranial abnormality. 2. No acute displaced fracture or traumatic listhesis of the cervical spine. Electronically Signed   By: Tish Frederickson M.D.   On: 02/29/2020 22:31   MR BRAIN WO CONTRAST  Result Date: 03/01/2020 CLINICAL DATA:  Fall, found down EXAM: MRI HEAD WITHOUT CONTRAST TECHNIQUE: Multiplanar, multiecho pulse sequences of the brain and surrounding structures were obtained without intravenous contrast. COMPARISON:  10/08/2019 FINDINGS: Brain: There is no acute infarction. Mild diffusion hyperintensity seen the left centrum semiovale and right corona radiata reflecting T2 shine through. Patchy and confluent areas of T2 hyperintensity in the supratentorial white matter are nonspecific but likely reflects stable chronic microvascular ischemic changes. There are chronic small vessel infarcts of the corona radiata, basal ganglia, and thalamus bilaterally as well as the central pons and right cerebellum. There is also a chronic infarct of the right  occipital lobe with associated chronic blood products. Unchanged scattered foci of susceptibility hypointensity reflecting chronic microhemorrhages likely secondary to chronic hypertension. There is no intracranial mass or mass effect.  No hydrocephalus. Vascular: Major vessel flow voids at the skull base are preserved. Skull and upper cervical spine: Normal marrow signal is preserved. Sinuses/Orbits: Mild mucosal thickening.  Orbits are unremarkable. Other: Sella is unremarkable. Minimal left mastoid fluid opacification. IMPRESSION: No acute infarction, hemorrhage, or mass. Stable chronic microvascular ischemic changes. Multiple chronic infarcts. Electronically Signed   By: Guadlupe Spanish M.D.   On: 03/01/2020 13:35   DG Pelvis Portable  Result Date: 02/29/2020 CLINICAL DATA:  Post fall, found down after several days. Right hip pain. EXAM: PORTABLE PELVIS 1-2 VIEWS COMPARISON:  None. FINDINGS: Oblique area of sclerosis involving the right femoral neck, better seen on  concurrent femur exam. No other evidence of pelvic fracture. Pubic symphysis and sacroiliac joints are congruent. Degenerative change of the right greater than left sacroiliac joint. Pubic rami are intact. IMPRESSION: Oblique area of sclerosis involving the right femoral neck, better seen on concurrent femur exam. This may represent a nondisplaced fracture versus artifact. Recommend further evaluation with CT or MRI. Electronically Signed   By: Narda Rutherford M.D.   On: 02/29/2020 22:03   CT Hip Right Wo Contrast  Result Date: 03/01/2020 CLINICAL DATA:  Found down after several days, upper thigh pain EXAM: CT OF THE RIGHT HIP WITHOUT CONTRAST TECHNIQUE: Multidetector CT imaging of the right hip was performed according to the standard protocol. Multiplanar CT image reconstructions were also generated. COMPARISON:  CT 01/14/2019 FINDINGS: Bones/Joint/Cartilage No acute fracture or traumatic malalignment is seen of the right hip or included bony  pelvis. The proximal femur is intact with the femoral head normally located within the acetabulum. There is a background moderate right hip osteoarthrosis as well as enthesopathic changes upon the greater trochanter and ischial tuberosity. Additional sclerotic changes are noted at the SI joints, right greater than left, asymmetry of this finding could reflect sequela of prior sacroiliitis though overall the appearance is similar to comparison imaging in 2020. Mild degenerative changes at the pubic symphysis, also stable. No suspicious lytic or blastic lesions. Ligaments Suboptimally assessed by CT. Muscles and Tendons Question some minimal edematous change of the right gluteal musculature possibly related to contusion in the setting of fall though may be accentuated by patient motion artifact towards the superior margins of imaging. No visible intramuscular collection is seen within the limitations of this unenhanced CT. Soft tissues Included portions of the pelvis demonstrate extensive atherosclerotic calcification in the vasculature. Pelvic contents are free of acute abnormality. There is some mild stranding and edematous changes of the right hip, could reflect contusive change or some more generalized soft tissue edema. No soft tissue gas foreign body. No large hematoma. IMPRESSION: 1. No acute fracture or traumatic malalignment of the right hip or included bony pelvis. 2. Mild soft tissue stranding and edematous changes of the lateral right hip, could reflect contusive change or some more generalized soft tissue edema. Some minimal edematous changes of the right gluteal musculature as well. No large hematoma. No soft tissue gas foreign body. 3. Aortic Atherosclerosis (ICD10-I70.0). Electronically Signed   By: Kreg Shropshire M.D.   On: 03/01/2020 00:50   DG Chest Portable 1 View  Result Date: 02/29/2020 CLINICAL DATA:  Post fall, found down after several days. Rhonchi on the right. EXAM: PORTABLE CHEST 1 VIEW  COMPARISON:  10/08/2019 FINDINGS: Low lung volumes. Borderline cardiomegaly, unchanged. Stable mediastinal contours with aortic atherosclerosis. No pulmonary edema. No visualized pneumothorax or pleural effusion. No acute osseous abnormalities are seen. Cervical collar in place. IMPRESSION: Low lung volumes without acute process. Electronically Signed   By: Narda Rutherford M.D.   On: 02/29/2020 22:00   DG Abd Portable 1 View  Result Date: 03/01/2020 CLINICAL DATA:  MRI screen EXAM: PORTABLE ABDOMEN - 1 VIEW COMPARISON:  February 29, 2020 FINDINGS: Air-filled nondilated loops of bowel. Severe degenerative changes of the RIGHT hip. Limited evaluation of the sacrum secondary to overlying bowel gas. There is an 18 mm circular metallic density projecting over the LEFT upper abdomen. Atherosclerotic calcifications of the aorta. Two calcific densities project over the LEFT hemiabdomen, unchanged and consistent with the sequela of fat necrosis. IMPRESSION: 1. 18 mm circular metallic density projecting over  the LEFT upper abdomen. Recommend correlation with physical exam to determine whether this is external to patient. Electronically Signed   By: Meda Klinefelter MD   On: 03/01/2020 12:32   DG Hip Unilat W or Wo Pelvis 2-3 Views Left  Result Date: 02/29/2020 CLINICAL DATA:  Fall several days ago with pain. EXAM: DG HIP (WITH OR WITHOUT PELVIS) 2-3V LEFT COMPARISON:  None. FINDINGS: Degenerative changes of the right hip with osteophytes. No left hip fracture identified. No dislocation. Vascular calcifications in the femoral arteries bilaterally. IMPRESSION: Degenerative changes. Vascular calcifications in the bilateral femoral arteries. No left hip fracture identified. Electronically Signed   By: Gerome Sam III M.D   On: 02/29/2020 19:55   DG Femur Min 2 Views Right  Result Date: 02/29/2020 CLINICAL DATA:  Post fall, found down after several days. EXAM: RIGHT FEMUR 2 VIEWS COMPARISON:  None. FINDINGS: Oblique  area of sclerosis involving the femoral neck may represent an impaction fracture versus overlapping osteophytes. Femoral head is well seated in the acetabulum. More distal femur is intact. Knee alignment is maintained. There are advanced vascular calcifications. IMPRESSION: Oblique area of sclerosis involving the femoral neck may represent an impaction fracture versus overlapping osteophytes. Recommend further evaluation with CT or MRI. Electronically Signed   By: Narda Rutherford M.D.   On: 02/29/2020 22:02   VAS [place] LOWER EXTREMITY VENOUS (DVT)  Result Date: 03/02/2020  Lower Venous DVT Study Indications: Elevated Ddimer.  Risk Factors: COVID 19 positive. Limitations: Body habitus, poor ultrasound/tissue interface and patient positioning. Comparison Study: No prior studies. Performing Technologist: Chanda Busing RVT  Examination Guidelines: A complete evaluation includes B-mode imaging, spectral Doppler, color Doppler, and power Doppler as needed of all accessible portions of each vessel. Bilateral testing is considered an integral part of a complete examination. Limited examinations for reoccurring indications may be performed as noted. The reflux portion of the exam is performed with the patient in reverse Trendelenburg.  +---------+---------------+---------+-----------+----------+--------------+ RIGHT    CompressibilityPhasicitySpontaneityPropertiesThrombus Aging +---------+---------------+---------+-----------+----------+--------------+ CFV      Full           Yes      Yes                                 +---------+---------------+---------+-----------+----------+--------------+ SFJ      Full                                                        +---------+---------------+---------+-----------+----------+--------------+ FV Prox  Full                                                        +---------+---------------+---------+-----------+----------+--------------+ FV Mid   Full                                                         +---------+---------------+---------+-----------+----------+--------------+ FV DistalFull                                                        +---------+---------------+---------+-----------+----------+--------------+  PFV      Full                                                        +---------+---------------+---------+-----------+----------+--------------+ POP      Full           Yes      Yes                                 +---------+---------------+---------+-----------+----------+--------------+ PTV      Full                                                        +---------+---------------+---------+-----------+----------+--------------+ PERO     Full                                                        +---------+---------------+---------+-----------+----------+--------------+   +---------+---------------+---------+-----------+----------+--------------+ LEFT     CompressibilityPhasicitySpontaneityPropertiesThrombus Aging +---------+---------------+---------+-----------+----------+--------------+ CFV      Full           Yes      Yes                                 +---------+---------------+---------+-----------+----------+--------------+ SFJ      Full                                                        +---------+---------------+---------+-----------+----------+--------------+ FV Prox  Full                                                        +---------+---------------+---------+-----------+----------+--------------+ FV Mid   Full                                                        +---------+---------------+---------+-----------+----------+--------------+ FV DistalFull                                                        +---------+---------------+---------+-----------+----------+--------------+ PFV      Full                                                         +---------+---------------+---------+-----------+----------+--------------+  POP      Full           Yes      Yes                                 +---------+---------------+---------+-----------+----------+--------------+ PTV      Full                                                        +---------+---------------+---------+-----------+----------+--------------+ PERO     Full                                                        +---------+---------------+---------+-----------+----------+--------------+     Summary: RIGHT: - There is no evidence of deep vein thrombosis in the lower extremity. However, portions of this examination were limited- see technologist comments above.  - No cystic structure found in the popliteal fossa.  LEFT: - There is no evidence of deep vein thrombosis in the lower extremity. However, portions of this examination were limited- see technologist comments above.  - No cystic structure found in the popliteal fossa.  *See table(s) above for measurements and observations. Electronically signed by Sherald Hess MD on 03/02/2020 at 1:22:09 PM.    Final    ECHOCARDIOGRAM LIMITED  Result Date: 03/01/2020    ECHOCARDIOGRAM LIMITED REPORT   Patient Name:   [name] Date of Exam: 03/01/2020 Medical Rec #:  [id number]   Height:       64.0 in Accession #:    [id number]  Weight:       168.0 lb Date of Birth:  18-Dec-1940   BSA:          1.817 m Patient Age:    79 years    BP:           143/118 mmHg Patient Gender: F           HR:           81 bpm. Exam Location:  Inpatient Procedure: Limited Echo, Cardiac Doppler and Color Doppler Indications:    Elevated troponin  History:        Patient has prior history of Echocardiogram examinations, most                 recent 10/09/2019. Risk Factors:Dyslipidemia, Former Smoker and                 Hypertension. COVID-19. PVD. H/O stroke.  Sonographer:    Ross Ludwig RDCS (AE) Referring Phys: [id number] [facility]   Sonographer Comments: COVID-19 IMPRESSIONS  1. Left ventricular ejection fraction, by estimation, is 70 to 75%. The left ventricle has hyperdynamic function. There is severe concentric left ventricular hypertrophy. Left ventricular diastolic parameters are consistent with Grade I diastolic dysfunction (impaired relaxation).  2. Right ventricular systolic function is normal. The right ventricular size is normal.  3. The mitral valve is grossly normal. Trivial mitral valve regurgitation.  4. The aortic valve is grossly normal. Aortic valve regurgitation is not visualized. FINDINGS  Left Ventricle: Left ventricular ejection fraction, by estimation, is 70 to 75%.  The left ventricle has hyperdynamic function. The left ventricular internal cavity size was normal in size. There is severe concentric left ventricular hypertrophy. Left ventricular diastolic parameters are consistent with Grade I diastolic dysfunction (impaired relaxation). Right Ventricle: The right ventricular size is normal. Right ventricular systolic function is normal. Mitral Valve: The mitral valve is grossly normal. Mild to moderate mitral annular calcification. Trivial mitral valve regurgitation. Aortic Valve: The aortic valve is grossly normal. Aortic valve regurgitation is not visualized. Aortic valve mean gradient measures 4.0 mmHg. Aortic valve peak gradient measures 8.5 mmHg. Aortic valve area, by VTI measures 3.78 cm. LEFT VENTRICLE PLAX 2D LVIDd:         4.00 cm  Diastology LVIDs:         2.80 cm  LV e' medial:    6.09 cm/s LV PW:         2.00 cm  LV E/e' medial:  6.2 LV IVS:        2.00 cm  LV e' lateral:   6.09 cm/s LVOT diam:     2.10 cm  LV E/e' lateral: 6.2 LV SV:         75 LV SV Index:   41 LVOT Area:     3.46 cm  IVC IVC diam: 1.00 cm LEFT ATRIUM         Index LA diam:    3.60 cm 1.98 cm/m  AORTIC VALVE AV Area (Vmax):    3.32 cm AV Area (Vmean):   2.66 cm AV Area (VTI):     3.78 cm AV Vmax:           146.00 cm/s AV Vmean:           97.900 cm/s AV VTI:            0.198 m AV Peak Grad:      8.5 mmHg AV Mean Grad:      4.0 mmHg LVOT Vmax:         140.00 cm/s LVOT Vmean:        75.300 cm/s LVOT VTI:          0.216 m LVOT/AV VTI ratio: 1.09  AORTA Ao Root diam: 3.50 cm MITRAL VALVE MV Area (PHT): 4.68 cm    SHUNTS MV Decel Time: 162 msec    Systemic VTI:  0.22 m MV E velocity: 37.70 cm/s  Systemic Diam: 2.10 cm MV A velocity: 65.60 cm/s MV E/A ratio:  0.57 Kristeen Miss MD Electronically signed by Kristeen Miss MD Signature Date/Time: 03/01/2020/3:25:36 PM    Final     Assessment/Plan ***   Family/ staff Communication: ***  Labs/tests ordered:  ***  Goals of care:   Short-term rehabilitation.    Kenard Gower, DNP, MSN, FNP-BC [facility] [phone] (Monday-Friday 8:00 a.m. - 5:00 p.m.) [phone] (after hours)

## 2020-03-26 DIAGNOSIS — R159 Full incontinence of feces: Secondary | ICD-10-CM | POA: Diagnosis not present

## 2020-03-26 DIAGNOSIS — I10 Essential (primary) hypertension: Secondary | ICD-10-CM | POA: Diagnosis not present

## 2020-03-26 DIAGNOSIS — Z602 Problems related to living alone: Secondary | ICD-10-CM | POA: Diagnosis not present

## 2020-03-26 DIAGNOSIS — H9193 Unspecified hearing loss, bilateral: Secondary | ICD-10-CM | POA: Diagnosis not present

## 2020-03-26 DIAGNOSIS — J1282 Pneumonia due to coronavirus disease 2019: Secondary | ICD-10-CM | POA: Diagnosis not present

## 2020-03-26 DIAGNOSIS — R1310 Dysphagia, unspecified: Secondary | ICD-10-CM | POA: Diagnosis not present

## 2020-03-26 DIAGNOSIS — U071 COVID-19: Secondary | ICD-10-CM | POA: Diagnosis not present

## 2020-03-26 DIAGNOSIS — R32 Unspecified urinary incontinence: Secondary | ICD-10-CM | POA: Diagnosis not present

## 2020-03-26 DIAGNOSIS — M545 Low back pain, unspecified: Secondary | ICD-10-CM | POA: Diagnosis not present

## 2020-03-26 DIAGNOSIS — I69354 Hemiplegia and hemiparesis following cerebral infarction affecting left non-dominant side: Secondary | ICD-10-CM | POA: Diagnosis not present

## 2020-03-26 DIAGNOSIS — I69392 Facial weakness following cerebral infarction: Secondary | ICD-10-CM | POA: Diagnosis not present

## 2020-03-26 DIAGNOSIS — Z7902 Long term (current) use of antithrombotics/antiplatelets: Secondary | ICD-10-CM | POA: Diagnosis not present

## 2020-03-26 DIAGNOSIS — K219 Gastro-esophageal reflux disease without esophagitis: Secondary | ICD-10-CM | POA: Diagnosis not present

## 2020-03-26 DIAGNOSIS — Z9181 History of falling: Secondary | ICD-10-CM | POA: Diagnosis not present

## 2020-03-26 MED ORDER — AMLODIPINE BESYLATE 10 MG PO TABS: 10.0000 mg | ORAL_TABLET | Freq: Every day | ORAL | 0 refills | Status: DC

## 2020-03-26 MED ORDER — METOPROLOL TARTRATE 50 MG PO TABS: 50.0000 mg | ORAL_TABLET | Freq: Every day | ORAL | 0 refills | Status: DC

## 2020-03-26 MED ORDER — CLOPIDOGREL BISULFATE 75 MG PO TABS: 75.0000 mg | ORAL_TABLET | Freq: Every day | ORAL | 0 refills | Status: DC

## 2020-03-26 MED ORDER — MIRABEGRON ER 25 MG PO TB24: 25.0000 mg | ORAL_TABLET | Freq: Every day | ORAL | 0 refills | Status: DC

## 2020-03-26 MED ORDER — ATORVASTATIN CALCIUM 10 MG PO TABS: 10.0000 mg | ORAL_TABLET | Freq: Every day | ORAL | 0 refills | Status: DC

## 2020-03-26 MED ORDER — ESCITALOPRAM OXALATE 10 MG PO TABS: ORAL_TABLET | ORAL | 0 refills | Status: DC

## 2020-03-26 MED ORDER — BUPROPION HCL ER (XL) 150 MG PO TB24: 150.0000 mg | ORAL_TABLET | Freq: Every day | ORAL | 0 refills | Status: DC

## 2020-03-26 MED ORDER — EZETIMIBE 10 MG PO TABS: 10.0000 mg | ORAL_TABLET | Freq: Every day | ORAL | 0 refills | Status: DC

## 2020-03-26 MED ORDER — ALBUTEROL SULFATE HFA 108 (90 BASE) MCG/ACT IN AERS: 2.0000 | INHALATION_SPRAY | Freq: Four times a day (QID) | RESPIRATORY_TRACT | 0 refills | Status: DC | PRN

## 2020-04-02 ENCOUNTER — Ambulatory Visit: Payer: Medicare HMO | Admitting: Adult Health

## 2020-05-01 ENCOUNTER — Ambulatory Visit: Payer: Medicare Other | Admitting: Podiatry

## 2020-05-07 ENCOUNTER — Other Ambulatory Visit: Payer: Self-pay

## 2020-05-07 ENCOUNTER — Ambulatory Visit: Payer: Medicare Other | Admitting: Podiatry

## 2020-05-07 ENCOUNTER — Encounter: Payer: Self-pay | Admitting: Podiatry

## 2020-05-07 DIAGNOSIS — B351 Tinea unguium: Secondary | ICD-10-CM

## 2020-05-07 DIAGNOSIS — L84 Corns and callosities: Secondary | ICD-10-CM

## 2020-05-07 DIAGNOSIS — M79675 Pain in left toe(s): Secondary | ICD-10-CM | POA: Diagnosis not present

## 2020-05-07 DIAGNOSIS — I739 Peripheral vascular disease, unspecified: Secondary | ICD-10-CM | POA: Diagnosis not present

## 2020-05-07 DIAGNOSIS — M79674 Pain in right toe(s): Secondary | ICD-10-CM

## 2020-05-07 NOTE — Progress Notes (Signed)
Subjective:   Patient ID: [name], female   DOB: 80 y.o.   MRN: [id number]   HPI Patient presents stating that she is got bad nails that she cannot take care of and severe corns on both feet that are painful and make walking difficult.  Patient does have vascular disease and is in poor health and has had some falls    ROS      Objective:  Physical Exam  Neurovascular status found to be diminished but intact with thick keratotic lesions plantar aspect both feet nucleated and thick yellow brittle nailbeds 1-5 both feet that are painful.  Patient does have diminished pulses DP PT and diminished cap fill time     Assessment:  At risk vascular patient mycotic nail infection 1-5 both feet with diminished hair growth also diminished skin turgor with thick yellow brittle nailbeds painful and thick lesions bilateral x3 each foot painful     Plan:  H&P discussed conditions debrided nailbeds debrided lesions reappoint routine care

## 2020-07-29 DIAGNOSIS — Z23 Encounter for immunization: Secondary | ICD-10-CM | POA: Diagnosis not present

## 2020-08-04 ENCOUNTER — Other Ambulatory Visit: Payer: Self-pay

## 2020-08-04 ENCOUNTER — Ambulatory Visit (INDEPENDENT_AMBULATORY_CARE_PROVIDER_SITE_OTHER): Payer: Medicare HMO | Admitting: Podiatry

## 2020-08-04 ENCOUNTER — Encounter: Payer: Self-pay | Admitting: Podiatry

## 2020-08-04 DIAGNOSIS — I739 Peripheral vascular disease, unspecified: Secondary | ICD-10-CM

## 2020-08-04 DIAGNOSIS — M79675 Pain in left toe(s): Secondary | ICD-10-CM | POA: Diagnosis not present

## 2020-08-04 DIAGNOSIS — M79674 Pain in right toe(s): Secondary | ICD-10-CM

## 2020-08-04 DIAGNOSIS — B351 Tinea unguium: Secondary | ICD-10-CM

## 2020-08-04 DIAGNOSIS — L84 Corns and callosities: Secondary | ICD-10-CM

## 2020-08-05 NOTE — Progress Notes (Signed)
Subjective:   Patient ID: [name], female   DOB: 80 y.o.   MRN: [id number]   HPI Patient presents with very thickened painful nailbeds 1-5 both feet and lesions on the bottom of both feet thick keratotic and hard for her to take care of with pain   ROS      Objective:  Physical Exam  At risk patient with painful mycotic thick yellow brittle nailbeds 1-5 both feet that are painful when pressed along with lesion formation plantar aspect first and fifth metatarsal heel region bilateral keratotic and painful     Assessment:  Chronic lesion formation painful chronic mycotic nail infection painful     Plan:  Need to draw the nailbeds 1-5 both feet neurogenic bleeding debridement of lesions bilateral no iatrogenic bleeding reappoint routine care

## 2020-08-27 DIAGNOSIS — M859 Disorder of bone density and structure, unspecified: Secondary | ICD-10-CM | POA: Diagnosis not present

## 2020-08-27 DIAGNOSIS — E785 Hyperlipidemia, unspecified: Secondary | ICD-10-CM | POA: Diagnosis not present

## 2020-09-03 DIAGNOSIS — R82998 Other abnormal findings in urine: Secondary | ICD-10-CM | POA: Diagnosis not present

## 2020-09-03 DIAGNOSIS — K921 Melena: Secondary | ICD-10-CM | POA: Diagnosis not present

## 2020-09-10 DIAGNOSIS — F319 Bipolar disorder, unspecified: Secondary | ICD-10-CM | POA: Diagnosis not present

## 2020-09-10 DIAGNOSIS — Z1339 Encounter for screening examination for other mental health and behavioral disorders: Secondary | ICD-10-CM | POA: Diagnosis not present

## 2020-09-10 DIAGNOSIS — I739 Peripheral vascular disease, unspecified: Secondary | ICD-10-CM | POA: Diagnosis not present

## 2020-09-10 DIAGNOSIS — M8580 Other specified disorders of bone density and structure, unspecified site: Secondary | ICD-10-CM | POA: Diagnosis not present

## 2020-09-10 DIAGNOSIS — I6529 Occlusion and stenosis of unspecified carotid artery: Secondary | ICD-10-CM | POA: Diagnosis not present

## 2020-09-10 DIAGNOSIS — I700 Atherosclerosis of aorta: Secondary | ICD-10-CM | POA: Diagnosis not present

## 2020-09-10 DIAGNOSIS — Z0000 Encounter for general adult medical examination without abnormal findings: Secondary | ICD-10-CM | POA: Diagnosis not present

## 2020-09-10 DIAGNOSIS — Z1331 Encounter for screening for depression: Secondary | ICD-10-CM | POA: Diagnosis not present

## 2020-09-10 DIAGNOSIS — I10 Essential (primary) hypertension: Secondary | ICD-10-CM | POA: Diagnosis not present

## 2020-09-10 DIAGNOSIS — I69354 Hemiplegia and hemiparesis following cerebral infarction affecting left non-dominant side: Secondary | ICD-10-CM | POA: Diagnosis not present

## 2020-09-10 DIAGNOSIS — E785 Hyperlipidemia, unspecified: Secondary | ICD-10-CM | POA: Diagnosis not present

## 2020-09-21 DIAGNOSIS — F331 Major depressive disorder, recurrent, moderate: Secondary | ICD-10-CM | POA: Diagnosis not present

## 2020-09-21 DIAGNOSIS — E785 Hyperlipidemia, unspecified: Secondary | ICD-10-CM | POA: Diagnosis not present

## 2020-09-21 DIAGNOSIS — M8580 Other specified disorders of bone density and structure, unspecified site: Secondary | ICD-10-CM | POA: Diagnosis not present

## 2020-09-21 DIAGNOSIS — I10 Essential (primary) hypertension: Secondary | ICD-10-CM | POA: Diagnosis not present

## 2020-10-01 DIAGNOSIS — M79671 Pain in right foot: Secondary | ICD-10-CM | POA: Diagnosis not present

## 2020-10-01 DIAGNOSIS — M79672 Pain in left foot: Secondary | ICD-10-CM | POA: Diagnosis not present

## 2020-10-01 DIAGNOSIS — L84 Corns and callosities: Secondary | ICD-10-CM | POA: Diagnosis not present

## 2020-10-17 ENCOUNTER — Other Ambulatory Visit: Payer: Self-pay

## 2020-10-17 ENCOUNTER — Ambulatory Visit (INDEPENDENT_AMBULATORY_CARE_PROVIDER_SITE_OTHER): Payer: Medicare HMO | Admitting: Podiatry

## 2020-10-17 ENCOUNTER — Encounter: Payer: Self-pay | Admitting: Podiatry

## 2020-10-17 DIAGNOSIS — M79674 Pain in right toe(s): Secondary | ICD-10-CM

## 2020-10-17 DIAGNOSIS — I739 Peripheral vascular disease, unspecified: Secondary | ICD-10-CM

## 2020-10-17 DIAGNOSIS — Q828 Other specified congenital malformations of skin: Secondary | ICD-10-CM | POA: Diagnosis not present

## 2020-10-17 DIAGNOSIS — G3184 Mild cognitive impairment, so stated: Secondary | ICD-10-CM | POA: Insufficient documentation

## 2020-10-17 DIAGNOSIS — M79675 Pain in left toe(s): Secondary | ICD-10-CM | POA: Diagnosis not present

## 2020-10-17 DIAGNOSIS — I700 Atherosclerosis of aorta: Secondary | ICD-10-CM | POA: Insufficient documentation

## 2020-10-17 DIAGNOSIS — I69959 Hemiplegia and hemiparesis following unspecified cerebrovascular disease affecting unspecified side: Secondary | ICD-10-CM | POA: Insufficient documentation

## 2020-10-17 DIAGNOSIS — L84 Corns and callosities: Secondary | ICD-10-CM | POA: Insufficient documentation

## 2020-10-17 DIAGNOSIS — B351 Tinea unguium: Secondary | ICD-10-CM

## 2020-10-17 DIAGNOSIS — Z0000 Encounter for general adult medical examination without abnormal findings: Secondary | ICD-10-CM | POA: Insufficient documentation

## 2020-10-17 DIAGNOSIS — M8580 Other specified disorders of bone density and structure, unspecified site: Secondary | ICD-10-CM | POA: Insufficient documentation

## 2020-10-17 DIAGNOSIS — J984 Other disorders of lung: Secondary | ICD-10-CM | POA: Insufficient documentation

## 2020-10-22 DIAGNOSIS — F331 Major depressive disorder, recurrent, moderate: Secondary | ICD-10-CM | POA: Diagnosis not present

## 2020-10-22 DIAGNOSIS — M8580 Other specified disorders of bone density and structure, unspecified site: Secondary | ICD-10-CM | POA: Diagnosis not present

## 2020-10-22 DIAGNOSIS — E785 Hyperlipidemia, unspecified: Secondary | ICD-10-CM | POA: Diagnosis not present

## 2020-10-22 DIAGNOSIS — I10 Essential (primary) hypertension: Secondary | ICD-10-CM | POA: Diagnosis not present

## 2020-10-22 NOTE — Progress Notes (Signed)
  Subjective:  Patient ID: [name], female    DOB: 02-24-1940,  MRN: [id number]  80 y.o. female presents with for at risk foot care. Patient has h/o PAD and painful porokeratotic lesions right foot.  Pain prevent comfortable ambulation. Aggravating factor is weightbearing with or without shoegear.Marland Kitchen    PCP: Cleatis Polka., MD and last visit was: 08/27/2020  Review of Systems: Negative except as noted in the HPI.   Allergies  Allergen Reactions   Atorvastatin     Other reaction(s): myalgias   Celecoxib     Other reaction(s): chest pain   Ezetimibe     Other reaction(s): GI side effects   Penicillin G     Other reaction(s): swelling   Penicillins Swelling and Other (See Comments)    Has patient had a PCN reaction causing immediate rash, facial/tongue/throat swelling, SOB or lightheadedness with hypotension: Yes Has patient had a PCN reaction causing severe rash involving mucus membranes or skin necrosis: No Has patient had a PCN reaction that required hospitalization: No Has patient had a PCN reaction occurring within the last 10 years: No If all of the above answers are "NO", then may proceed with Cephalosporin use.    Rosuvastatin     Other reaction(s): myalgias   Shellfish Allergy Itching and Swelling   Zoloft [Sertraline] Rash    Objective:  There were no vitals filed for this visit. Constitutional Patient is a pleasant 80 y.o. African American female WD, WN in NAD. AAO x 3.  Vascular Capillary fill time to digits <3 seconds b/l lower extremities. Nonpalpable DP pulse(s) b/l lower extremities. Nonpalpable PT pulse(s) b/l lower extremities. Pedal hair absent. Lower extremity skin temperature gradient within normal limits. No pain with calf compression b/l. No edema noted b/l lower extremities. No cyanosis or clubbing noted.  Neurologic Normal speech. Protective sensation intact 5/5 intact bilaterally with 10g monofilament b/l. Vibratory sensation intact b/l.  Dermatologic  Pedal skin is thin shiny, atrophic b/l lower extremities. No open wounds b/l lower extremities. No interdigital macerations b/l lower extremities. Toenails 1-5 b/l elongated, incurvated, discolored, dystrophic, thickened, crumbly with subungual debris and tenderness to dorsal palpation.  Orthopedic: Normal muscle strength 5/5 to all lower extremity muscle groups bilaterally. Patient ambulates independent of any assistive aids.    Assessment:   1. Pain due to onychomycosis of toenails of both feet   2. Porokeratosis   3. PVD (peripheral vascular disease) with claudication (HCC)    Plan:  Patient was evaluated and treated and all questions answered. Consent given for treatment as described below: -Examined patient. -Patient to continue soft, supportive shoe gear daily. -Toenails 1-5 b/l were debrided in length and girth with sterile nail nippers and dremel without iatrogenic bleeding.  -Painful porokeratotic lesion(s) submet head 5 right foot pared and enucleated with sterile scalpel blade without incident. Total number of lesions debrided=1. -Patient to report any pedal injuries to medical professional immediately. -Patient/POA to call should there be question/concern in the interim.  No follow-ups on file.  Freddie Breech, DPM

## 2020-11-21 DIAGNOSIS — F331 Major depressive disorder, recurrent, moderate: Secondary | ICD-10-CM | POA: Diagnosis not present

## 2020-11-21 DIAGNOSIS — I10 Essential (primary) hypertension: Secondary | ICD-10-CM | POA: Diagnosis not present

## 2020-11-21 DIAGNOSIS — M8580 Other specified disorders of bone density and structure, unspecified site: Secondary | ICD-10-CM | POA: Diagnosis not present

## 2020-11-21 DIAGNOSIS — E785 Hyperlipidemia, unspecified: Secondary | ICD-10-CM | POA: Diagnosis not present

## 2020-12-03 DIAGNOSIS — H52203 Unspecified astigmatism, bilateral: Secondary | ICD-10-CM | POA: Diagnosis not present

## 2020-12-03 DIAGNOSIS — Z0100 Encounter for examination of eyes and vision without abnormal findings: Secondary | ICD-10-CM | POA: Diagnosis not present

## 2020-12-03 DIAGNOSIS — H2513 Age-related nuclear cataract, bilateral: Secondary | ICD-10-CM | POA: Diagnosis not present

## 2020-12-03 DIAGNOSIS — H524 Presbyopia: Secondary | ICD-10-CM | POA: Diagnosis not present

## 2020-12-03 DIAGNOSIS — H5213 Myopia, bilateral: Secondary | ICD-10-CM | POA: Diagnosis not present

## 2020-12-03 DIAGNOSIS — H25013 Cortical age-related cataract, bilateral: Secondary | ICD-10-CM | POA: Diagnosis not present

## 2020-12-04 ENCOUNTER — Ambulatory Visit: Payer: Medicare HMO | Admitting: Podiatry

## 2020-12-05 DIAGNOSIS — Z6828 Body mass index (BMI) 28.0-28.9, adult: Secondary | ICD-10-CM | POA: Diagnosis not present

## 2020-12-05 DIAGNOSIS — E663 Overweight: Secondary | ICD-10-CM | POA: Diagnosis not present

## 2020-12-05 DIAGNOSIS — Z0000 Encounter for general adult medical examination without abnormal findings: Secondary | ICD-10-CM | POA: Diagnosis not present

## 2020-12-05 DIAGNOSIS — Z008 Encounter for other general examination: Secondary | ICD-10-CM | POA: Diagnosis not present

## 2020-12-22 DIAGNOSIS — E785 Hyperlipidemia, unspecified: Secondary | ICD-10-CM | POA: Diagnosis not present

## 2020-12-22 DIAGNOSIS — F331 Major depressive disorder, recurrent, moderate: Secondary | ICD-10-CM | POA: Diagnosis not present

## 2020-12-22 DIAGNOSIS — M8580 Other specified disorders of bone density and structure, unspecified site: Secondary | ICD-10-CM | POA: Diagnosis not present

## 2020-12-22 DIAGNOSIS — I10 Essential (primary) hypertension: Secondary | ICD-10-CM | POA: Diagnosis not present

## 2021-01-21 DIAGNOSIS — I10 Essential (primary) hypertension: Secondary | ICD-10-CM | POA: Diagnosis not present

## 2021-01-21 DIAGNOSIS — M8580 Other specified disorders of bone density and structure, unspecified site: Secondary | ICD-10-CM | POA: Diagnosis not present

## 2021-01-21 DIAGNOSIS — E785 Hyperlipidemia, unspecified: Secondary | ICD-10-CM | POA: Diagnosis not present

## 2021-01-21 DIAGNOSIS — I700 Atherosclerosis of aorta: Secondary | ICD-10-CM | POA: Diagnosis not present

## 2021-02-20 DIAGNOSIS — E785 Hyperlipidemia, unspecified: Secondary | ICD-10-CM | POA: Diagnosis not present

## 2021-02-20 DIAGNOSIS — I10 Essential (primary) hypertension: Secondary | ICD-10-CM | POA: Diagnosis not present

## 2021-02-20 DIAGNOSIS — M8580 Other specified disorders of bone density and structure, unspecified site: Secondary | ICD-10-CM | POA: Diagnosis not present

## 2021-02-20 DIAGNOSIS — I700 Atherosclerosis of aorta: Secondary | ICD-10-CM | POA: Diagnosis not present

## 2021-02-27 ENCOUNTER — Ambulatory Visit (INDEPENDENT_AMBULATORY_CARE_PROVIDER_SITE_OTHER): Payer: Self-pay | Admitting: Podiatry

## 2021-02-27 DIAGNOSIS — Z91199 Patient's noncompliance with other medical treatment and regimen due to unspecified reason: Secondary | ICD-10-CM

## 2021-02-28 NOTE — Progress Notes (Signed)
   Complete physical exam  Patient: [name]   DOB: 12/12/1996   81 y.o. Female  MRN: [id number]  Subjective:    No chief complaint on file.   [name] is a 81 y.o. female who presents today for a complete physical exam. She reports consuming a {diet types:17450} diet. {types:19826} She generally feels {DESC; WELL/FAIRLY WELL/POORLY:18703}. She reports sleeping {DESC; WELL/FAIRLY WELL/POORLY:18703}. She {does/does not:200015} have additional problems to discuss today.    Most recent fall risk assessment:    08/19/2021   10:42 AM  Fall Risk   Falls in the past year? 0  Number falls in past yr: 0  Injury with Fall? 0  Risk for fall due to : No Fall Risks  Follow up Falls evaluation completed     Most recent depression screenings:    08/19/2021   10:42 AM 07/10/2020   10:46 AM  PHQ 2/9 Scores  PHQ - 2 Score 0 0  PHQ- 9 Score 5     {VISON DENTAL STD PSA (Optional):27386}  {History (Optional):23778}  Patient Care Team: Jessup, Joy, NP as PCP - General (Nurse Practitioner)   Outpatient Medications Prior to Visit  Medication Sig   fluticasone (FLONASE) 50 MCG/ACT nasal spray Place 2 sprays into both nostrils in the morning and at bedtime. After 7 days, reduce to once daily.   norgestimate-ethinyl estradiol (SPRINTEC 28) 0.25-35 MG-MCG tablet Take 1 tablet by mouth daily.   Nystatin POWD Apply liberally to affected area 2 times per day   spironolactone (ALDACTONE) 100 MG tablet Take 1 tablet (100 mg total) by mouth daily.   No facility-administered medications prior to visit.    ROS        Objective:     There were no vitals taken for this visit. {Vitals History (Optional):23777}  Physical Exam   No results found for any visits on 09/24/21. {Show previous labs (optional):23779}    Assessment & Plan:    Routine Health Maintenance and Physical Exam  Immunization History  Administered Date(s) Administered   DTaP 02/25/1999, 04/23/1999,  07/02/1999, 03/17/2000, 10/01/2003   Hepatitis A 07/28/2007, 08/02/2008   Hepatitis B 12/13/1998, 01/20/1999, 07/02/1999   HiB (PRP-OMP) 02/25/1999, 04/23/1999, 07/02/1999, 03/17/2000   IPV 02/25/1999, 04/23/1999, 12/21/1999, 10/01/2003   Influenza,inj,Quad PF,6+ Mos 11/02/2013   Influenza-Unspecified 02/02/2012   MMR 12/20/2000, 10/01/2003   Meningococcal Polysaccharide 08/02/2011   Pneumococcal Conjugate-13 03/17/2000   Pneumococcal-Unspecified 07/02/1999, 09/15/1999   Tdap 08/02/2011   Varicella 12/21/1999, 07/28/2007    Health Maintenance  Topic Date Due   HIV Screening  Never done   Hepatitis C Screening  Never done   INFLUENZA VACCINE  09/22/2021   PAP-Cervical Cytology Screening  09/24/2021 (Originally 12/12/2019)   PAP SMEAR-Modifier  09/24/2021 (Originally 12/12/2019)   TETANUS/TDAP  09/24/2021 (Originally 08/01/2021)   HPV VACCINES  Discontinued   COVID-19 Vaccine  Discontinued    Discussed health benefits of physical activity, and encouraged her to engage in regular exercise appropriate for her age and condition.  Problem List Items Addressed This Visit   None Visit Diagnoses     Annual physical exam    -  Primary   Cervical cancer screening       Need for Tdap vaccination          No follow-ups on file.     Joy Jessup, NP   

## 2021-03-06 ENCOUNTER — Ambulatory Visit (INDEPENDENT_AMBULATORY_CARE_PROVIDER_SITE_OTHER): Payer: Self-pay | Admitting: Podiatry

## 2021-03-06 DIAGNOSIS — Z91199 Patient's noncompliance with other medical treatment and regimen due to unspecified reason: Secondary | ICD-10-CM

## 2021-03-09 NOTE — Progress Notes (Signed)
   Complete physical exam  Patient: [name]   DOB: 12/12/1996   81 y.o. Female  MRN: [id number]  Subjective:    No chief complaint on file.   [name] is a 81 y.o. female who presents today for a complete physical exam. She reports consuming a {diet types:17450} diet. {types:19826} She generally feels {DESC; WELL/FAIRLY WELL/POORLY:18703}. She reports sleeping {DESC; WELL/FAIRLY WELL/POORLY:18703}. She {does/does not:200015} have additional problems to discuss today.    Most recent fall risk assessment:    08/19/2021   10:42 AM  Fall Risk   Falls in the past year? 0  Number falls in past yr: 0  Injury with Fall? 0  Risk for fall due to : No Fall Risks  Follow up Falls evaluation completed     Most recent depression screenings:    08/19/2021   10:42 AM 07/10/2020   10:46 AM  PHQ 2/9 Scores  PHQ - 2 Score 0 0  PHQ- 9 Score 5     {VISON DENTAL STD PSA (Optional):27386}  {History (Optional):23778}  Patient Care Team: Jessup, Joy, NP as PCP - General (Nurse Practitioner)   Outpatient Medications Prior to Visit  Medication Sig   fluticasone (FLONASE) 50 MCG/ACT nasal spray Place 2 sprays into both nostrils in the morning and at bedtime. After 7 days, reduce to once daily.   norgestimate-ethinyl estradiol (SPRINTEC 28) 0.25-35 MG-MCG tablet Take 1 tablet by mouth daily.   Nystatin POWD Apply liberally to affected area 2 times per day   spironolactone (ALDACTONE) 100 MG tablet Take 1 tablet (100 mg total) by mouth daily.   No facility-administered medications prior to visit.    ROS        Objective:     There were no vitals taken for this visit. {Vitals History (Optional):23777}  Physical Exam   No results found for any visits on 09/24/21. {Show previous labs (optional):23779}    Assessment & Plan:    Routine Health Maintenance and Physical Exam  Immunization History  Administered Date(s) Administered   DTaP 02/25/1999, 04/23/1999,  07/02/1999, 03/17/2000, 10/01/2003   Hepatitis A 07/28/2007, 08/02/2008   Hepatitis B 12/13/1998, 01/20/1999, 07/02/1999   HiB (PRP-OMP) 02/25/1999, 04/23/1999, 07/02/1999, 03/17/2000   IPV 02/25/1999, 04/23/1999, 12/21/1999, 10/01/2003   Influenza,inj,Quad PF,6+ Mos 11/02/2013   Influenza-Unspecified 02/02/2012   MMR 12/20/2000, 10/01/2003   Meningococcal Polysaccharide 08/02/2011   Pneumococcal Conjugate-13 03/17/2000   Pneumococcal-Unspecified 07/02/1999, 09/15/1999   Tdap 08/02/2011   Varicella 12/21/1999, 07/28/2007    Health Maintenance  Topic Date Due   HIV Screening  Never done   Hepatitis C Screening  Never done   INFLUENZA VACCINE  09/22/2021   PAP-Cervical Cytology Screening  09/24/2021 (Originally 12/12/2019)   PAP SMEAR-Modifier  09/24/2021 (Originally 12/12/2019)   TETANUS/TDAP  09/24/2021 (Originally 08/01/2021)   HPV VACCINES  Discontinued   COVID-19 Vaccine  Discontinued    Discussed health benefits of physical activity, and encouraged her to engage in regular exercise appropriate for her age and condition.  Problem List Items Addressed This Visit   None Visit Diagnoses     Annual physical exam    -  Primary   Cervical cancer screening       Need for Tdap vaccination          No follow-ups on file.     Joy Jessup, NP   

## 2021-03-13 ENCOUNTER — Encounter: Payer: Self-pay | Admitting: Podiatry

## 2021-03-13 ENCOUNTER — Other Ambulatory Visit: Payer: Self-pay

## 2021-03-13 ENCOUNTER — Ambulatory Visit: Payer: Medicare HMO | Admitting: Podiatry

## 2021-03-13 DIAGNOSIS — B351 Tinea unguium: Secondary | ICD-10-CM

## 2021-03-13 DIAGNOSIS — M79674 Pain in right toe(s): Secondary | ICD-10-CM | POA: Diagnosis not present

## 2021-03-13 DIAGNOSIS — Q828 Other specified congenital malformations of skin: Secondary | ICD-10-CM | POA: Diagnosis not present

## 2021-03-13 DIAGNOSIS — M79675 Pain in left toe(s): Secondary | ICD-10-CM

## 2021-03-13 DIAGNOSIS — I739 Peripheral vascular disease, unspecified: Secondary | ICD-10-CM

## 2021-03-19 DIAGNOSIS — Z23 Encounter for immunization: Secondary | ICD-10-CM | POA: Diagnosis not present

## 2021-03-19 DIAGNOSIS — I10 Essential (primary) hypertension: Secondary | ICD-10-CM | POA: Diagnosis not present

## 2021-03-19 DIAGNOSIS — I700 Atherosclerosis of aorta: Secondary | ICD-10-CM | POA: Diagnosis not present

## 2021-03-19 DIAGNOSIS — I69354 Hemiplegia and hemiparesis following cerebral infarction affecting left non-dominant side: Secondary | ICD-10-CM | POA: Diagnosis not present

## 2021-03-19 DIAGNOSIS — I739 Peripheral vascular disease, unspecified: Secondary | ICD-10-CM | POA: Diagnosis not present

## 2021-03-19 DIAGNOSIS — J984 Other disorders of lung: Secondary | ICD-10-CM | POA: Diagnosis not present

## 2021-03-19 DIAGNOSIS — F319 Bipolar disorder, unspecified: Secondary | ICD-10-CM | POA: Diagnosis not present

## 2021-03-19 DIAGNOSIS — R7301 Impaired fasting glucose: Secondary | ICD-10-CM | POA: Diagnosis not present

## 2021-03-19 DIAGNOSIS — I6529 Occlusion and stenosis of unspecified carotid artery: Secondary | ICD-10-CM | POA: Diagnosis not present

## 2021-03-19 DIAGNOSIS — E785 Hyperlipidemia, unspecified: Secondary | ICD-10-CM | POA: Diagnosis not present

## 2021-03-19 DIAGNOSIS — D649 Anemia, unspecified: Secondary | ICD-10-CM | POA: Diagnosis not present

## 2021-03-19 NOTE — Progress Notes (Signed)
°  Subjective:  Patient ID: [name], female    DOB: 04-19-1940,  MRN: [id number]  HAELEY Price presents to clinic today for for at risk foot care. Patient has h/o PAD and painful porokeratotic lesion(s) bilaterally and painful mycotic toenails that limit ambulation. Painful toenails interfere with ambulation. Aggravating factors include wearing enclosed shoe gear. Pain is relieved with periodic professional debridement. Painful porokeratotic lesions are aggravated when weightbearing with and without shoegear. Pain is relieved with periodic professional debridement.  Patient voices no new pedal complaints.   PCP is [name]., MD , and last visit was 12/22/2020.  Allergies  Allergen Reactions   Atorvastatin     Other reaction(s): myalgias   Celecoxib     Other reaction(s): chest pain   Ezetimibe     Other reaction(s): GI side effects   Penicillin G     Other reaction(s): swelling   Penicillins Swelling and Other (See Comments)    Has patient had a PCN reaction causing immediate rash, facial/tongue/throat swelling, SOB or lightheadedness with hypotension: Yes Has patient had a PCN reaction causing severe rash involving mucus membranes or skin necrosis: No Has patient had a PCN reaction that required hospitalization: No Has patient had a PCN reaction occurring within the last 10 years: No If all of the above answers are "NO", then may proceed with Cephalosporin use.    Rosuvastatin     Other reaction(s): myalgias   Shellfish Allergy Itching and Swelling   Zoloft [Sertraline] Rash    Review of Systems: Negative except as noted in the HPI. Objective:   Constitutional [name] is a pleasant 81 y.o. African American female, in NAD. AAO x 3.   Vascular CFT delayed b/l LE. Nonpalpable pedal pulses b/l. Pedal hair absent. No pain with calf compression b/l. Lower extremity skin temperature gradient warm to cool. +1 pitting edema noted BLE. Evidence of chronic venous  insufficiency b/l LE. No ischemia or gangrene noted b/l LE. No cyanosis or clubbing noted b/l LE.  Neurologic Normal speech. Oriented to person, place, and time. Protective sensation intact 5/5 intact bilaterally with 10g monofilament b/l.  Dermatologic Pedal skin thin and atrophic b/l LE. No open wounds b/l LE. No interdigital macerations noted b/l LE. Toenails 1-5 b/l elongated, discolored, dystrophic, thickened, crumbly with subungual debris and tenderness to dorsal palpation. Porokeratotic lesion(s) plantar heel pad of right foot and submet head 5 right foot. No erythema, no edema, no drainage, no fluctuance.  Orthopedic: Muscle strength 5/5 to all lower extremity muscle groups bilaterally. Pes planus deformity noted bilateral LE.   Radiographs: None  Assessment:   1. Pain due to onychomycosis of toenails of both feet   2. Porokeratosis   3. PVD (peripheral vascular disease) with claudication (HCC)    Plan:  Patient was evaluated and treated and all questions answered. Consent given for treatment as described below: -Patient has established relationship with Vascular Surgery and seems to have missed her last appointment over two years ago. Will forward to Vascular Surgery so that she may schedule followup. -Mycotic toenails 1-5 bilaterally were debrided in length and girth with sterile nail nippers and dremel without incident. -Painful porokeratotic lesion(s) plantar heel pad of right foot and submet head 5 right foot pared and enucleated with sterile scalpel blade without incident. Total number of lesions debrided=2. -Patient/POA to call should there be question/concern in the interim.  Return in about 3 months (around 06/11/2021).  Freddie Breech, DPM

## 2021-03-22 DIAGNOSIS — E785 Hyperlipidemia, unspecified: Secondary | ICD-10-CM | POA: Diagnosis not present

## 2021-03-22 DIAGNOSIS — I10 Essential (primary) hypertension: Secondary | ICD-10-CM | POA: Diagnosis not present

## 2021-03-22 DIAGNOSIS — M8580 Other specified disorders of bone density and structure, unspecified site: Secondary | ICD-10-CM | POA: Diagnosis not present

## 2021-03-22 DIAGNOSIS — I700 Atherosclerosis of aorta: Secondary | ICD-10-CM | POA: Diagnosis not present

## 2021-03-26 DIAGNOSIS — H2513 Age-related nuclear cataract, bilateral: Secondary | ICD-10-CM | POA: Diagnosis not present

## 2021-03-26 DIAGNOSIS — H25013 Cortical age-related cataract, bilateral: Secondary | ICD-10-CM | POA: Diagnosis not present

## 2021-04-02 ENCOUNTER — Other Ambulatory Visit: Payer: Self-pay

## 2021-04-02 DIAGNOSIS — I739 Peripheral vascular disease, unspecified: Secondary | ICD-10-CM

## 2021-04-09 ENCOUNTER — Encounter (HOSPITAL_COMMUNITY): Payer: Medicare HMO

## 2021-04-09 ENCOUNTER — Encounter: Payer: Medicare HMO | Admitting: Vascular Surgery

## 2021-04-21 DIAGNOSIS — I700 Atherosclerosis of aorta: Secondary | ICD-10-CM | POA: Diagnosis not present

## 2021-04-21 DIAGNOSIS — I10 Essential (primary) hypertension: Secondary | ICD-10-CM | POA: Diagnosis not present

## 2021-04-21 DIAGNOSIS — M8580 Other specified disorders of bone density and structure, unspecified site: Secondary | ICD-10-CM | POA: Diagnosis not present

## 2021-04-21 DIAGNOSIS — E785 Hyperlipidemia, unspecified: Secondary | ICD-10-CM | POA: Diagnosis not present

## 2021-04-24 ENCOUNTER — Inpatient Hospital Stay (HOSPITAL_COMMUNITY): Admission: RE | Admit: 2021-04-24 | Payer: Medicare HMO | Source: Ambulatory Visit

## 2021-04-24 ENCOUNTER — Encounter: Payer: Medicare HMO | Admitting: Vascular Surgery

## 2021-05-13 DIAGNOSIS — H25011 Cortical age-related cataract, right eye: Secondary | ICD-10-CM | POA: Diagnosis not present

## 2021-05-13 DIAGNOSIS — H2511 Age-related nuclear cataract, right eye: Secondary | ICD-10-CM | POA: Diagnosis not present

## 2021-05-20 DIAGNOSIS — H25012 Cortical age-related cataract, left eye: Secondary | ICD-10-CM | POA: Diagnosis not present

## 2021-05-20 DIAGNOSIS — H2512 Age-related nuclear cataract, left eye: Secondary | ICD-10-CM | POA: Diagnosis not present

## 2021-05-22 DIAGNOSIS — M8580 Other specified disorders of bone density and structure, unspecified site: Secondary | ICD-10-CM | POA: Diagnosis not present

## 2021-05-22 DIAGNOSIS — E785 Hyperlipidemia, unspecified: Secondary | ICD-10-CM | POA: Diagnosis not present

## 2021-05-22 DIAGNOSIS — I700 Atherosclerosis of aorta: Secondary | ICD-10-CM | POA: Diagnosis not present

## 2021-05-22 DIAGNOSIS — I10 Essential (primary) hypertension: Secondary | ICD-10-CM | POA: Diagnosis not present

## 2021-06-15 ENCOUNTER — Ambulatory Visit (INDEPENDENT_AMBULATORY_CARE_PROVIDER_SITE_OTHER): Payer: Self-pay | Admitting: Podiatry

## 2021-06-15 DIAGNOSIS — Z91199 Patient's noncompliance with other medical treatment and regimen due to unspecified reason: Secondary | ICD-10-CM

## 2021-06-21 DIAGNOSIS — E785 Hyperlipidemia, unspecified: Secondary | ICD-10-CM | POA: Diagnosis not present

## 2021-06-21 DIAGNOSIS — I700 Atherosclerosis of aorta: Secondary | ICD-10-CM | POA: Diagnosis not present

## 2021-06-21 DIAGNOSIS — I10 Essential (primary) hypertension: Secondary | ICD-10-CM | POA: Diagnosis not present

## 2021-06-21 NOTE — Progress Notes (Signed)
   Complete physical exam  Patient: [name]   DOB: 12/12/1996   81 y.o. Female  MRN: [id number]  Subjective:    No chief complaint on file.   [name] is a 81 y.o. female who presents today for a complete physical exam. She reports consuming a {diet types:17450} diet. {types:19826} She generally feels {DESC; WELL/FAIRLY WELL/POORLY:18703}. She reports sleeping {DESC; WELL/FAIRLY WELL/POORLY:18703}. She {does/does not:200015} have additional problems to discuss today.    Most recent fall risk assessment:    08/19/2021   10:42 AM  Fall Risk   Falls in the past year? 0  Number falls in past yr: 0  Injury with Fall? 0  Risk for fall due to : No Fall Risks  Follow up Falls evaluation completed     Most recent depression screenings:    08/19/2021   10:42 AM 07/10/2020   10:46 AM  PHQ 2/9 Scores  PHQ - 2 Score 0 0  PHQ- 9 Score 5     {VISON DENTAL STD PSA (Optional):27386}  {History (Optional):23778}  Patient Care Team: Jessup, Joy, NP as PCP - General (Nurse Practitioner)   Outpatient Medications Prior to Visit  Medication Sig   fluticasone (FLONASE) 50 MCG/ACT nasal spray Place 2 sprays into both nostrils in the morning and at bedtime. After 7 days, reduce to once daily.   norgestimate-ethinyl estradiol (SPRINTEC 28) 0.25-35 MG-MCG tablet Take 1 tablet by mouth daily.   Nystatin POWD Apply liberally to affected area 2 times per day   spironolactone (ALDACTONE) 100 MG tablet Take 1 tablet (100 mg total) by mouth daily.   No facility-administered medications prior to visit.    ROS        Objective:     There were no vitals taken for this visit. {Vitals History (Optional):23777}  Physical Exam   No results found for any visits on 09/24/21. {Show previous labs (optional):23779}    Assessment & Plan:    Routine Health Maintenance and Physical Exam  Immunization History  Administered Date(s) Administered   DTaP 02/25/1999, 04/23/1999,  07/02/1999, 03/17/2000, 10/01/2003   Hepatitis A 07/28/2007, 08/02/2008   Hepatitis B 12/13/1998, 01/20/1999, 07/02/1999   HiB (PRP-OMP) 02/25/1999, 04/23/1999, 07/02/1999, 03/17/2000   IPV 02/25/1999, 04/23/1999, 12/21/1999, 10/01/2003   Influenza,inj,Quad PF,6+ Mos 11/02/2013   Influenza-Unspecified 02/02/2012   MMR 12/20/2000, 10/01/2003   Meningococcal Polysaccharide 08/02/2011   Pneumococcal Conjugate-13 03/17/2000   Pneumococcal-Unspecified 07/02/1999, 09/15/1999   Tdap 08/02/2011   Varicella 12/21/1999, 07/28/2007    Health Maintenance  Topic Date Due   HIV Screening  Never done   Hepatitis C Screening  Never done   INFLUENZA VACCINE  09/22/2021   PAP-Cervical Cytology Screening  09/24/2021 (Originally 12/12/2019)   PAP SMEAR-Modifier  09/24/2021 (Originally 12/12/2019)   TETANUS/TDAP  09/24/2021 (Originally 08/01/2021)   HPV VACCINES  Discontinued   COVID-19 Vaccine  Discontinued    Discussed health benefits of physical activity, and encouraged her to engage in regular exercise appropriate for her age and condition.  Problem List Items Addressed This Visit   None Visit Diagnoses     Annual physical exam    -  Primary   Cervical cancer screening       Need for Tdap vaccination          No follow-ups on file.     Joy Jessup, NP   

## 2021-06-24 ENCOUNTER — Inpatient Hospital Stay (HOSPITAL_COMMUNITY): Payer: Medicare HMO

## 2021-06-24 ENCOUNTER — Emergency Department (HOSPITAL_COMMUNITY): Payer: Medicare HMO

## 2021-06-24 ENCOUNTER — Encounter (HOSPITAL_COMMUNITY): Payer: Self-pay | Admitting: Emergency Medicine

## 2021-06-24 ENCOUNTER — Inpatient Hospital Stay (HOSPITAL_COMMUNITY)
Admission: EM | Admit: 2021-06-24 | Discharge: 2021-07-08 | DRG: 065 | Disposition: A | Discharge status: Skilled Nursing Facility | Payer: Medicare HMO | Attending: Family Medicine | Admitting: Family Medicine

## 2021-06-24 ENCOUNTER — Other Ambulatory Visit: Payer: Self-pay

## 2021-06-24 DIAGNOSIS — R2681 Unsteadiness on feet: Secondary | ICD-10-CM | POA: Diagnosis not present

## 2021-06-24 DIAGNOSIS — I4891 Unspecified atrial fibrillation: Secondary | ICD-10-CM | POA: Diagnosis not present

## 2021-06-24 DIAGNOSIS — I739 Peripheral vascular disease, unspecified: Secondary | ICD-10-CM | POA: Diagnosis present

## 2021-06-24 DIAGNOSIS — Z4682 Encounter for fitting and adjustment of non-vascular catheter: Secondary | ICD-10-CM | POA: Diagnosis not present

## 2021-06-24 DIAGNOSIS — M6281 Muscle weakness (generalized): Secondary | ICD-10-CM | POA: Diagnosis not present

## 2021-06-24 DIAGNOSIS — I6523 Occlusion and stenosis of bilateral carotid arteries: Secondary | ICD-10-CM | POA: Diagnosis not present

## 2021-06-24 DIAGNOSIS — I10 Essential (primary) hypertension: Secondary | ICD-10-CM | POA: Diagnosis present

## 2021-06-24 DIAGNOSIS — W19XXXA Unspecified fall, initial encounter: Secondary | ICD-10-CM | POA: Diagnosis present

## 2021-06-24 DIAGNOSIS — Z87891 Personal history of nicotine dependence: Secondary | ICD-10-CM

## 2021-06-24 DIAGNOSIS — Z01818 Encounter for other preprocedural examination: Secondary | ICD-10-CM | POA: Diagnosis not present

## 2021-06-24 DIAGNOSIS — Z8673 Personal history of transient ischemic attack (TIA), and cerebral infarction without residual deficits: Secondary | ICD-10-CM | POA: Diagnosis not present

## 2021-06-24 DIAGNOSIS — I69354 Hemiplegia and hemiparesis following cerebral infarction affecting left non-dominant side: Secondary | ICD-10-CM

## 2021-06-24 DIAGNOSIS — I674 Hypertensive encephalopathy: Secondary | ICD-10-CM | POA: Diagnosis present

## 2021-06-24 DIAGNOSIS — F419 Anxiety disorder, unspecified: Secondary | ICD-10-CM

## 2021-06-24 DIAGNOSIS — Z888 Allergy status to other drugs, medicaments and biological substances status: Secondary | ICD-10-CM | POA: Diagnosis not present

## 2021-06-24 DIAGNOSIS — Z880 Allergy status to penicillin: Secondary | ICD-10-CM | POA: Diagnosis not present

## 2021-06-24 DIAGNOSIS — R4701 Aphasia: Secondary | ICD-10-CM | POA: Diagnosis present

## 2021-06-24 DIAGNOSIS — I679 Cerebrovascular disease, unspecified: Secondary | ICD-10-CM

## 2021-06-24 DIAGNOSIS — R410 Disorientation, unspecified: Secondary | ICD-10-CM | POA: Diagnosis not present

## 2021-06-24 DIAGNOSIS — R531 Weakness: Secondary | ICD-10-CM | POA: Diagnosis not present

## 2021-06-24 DIAGNOSIS — R7989 Other specified abnormal findings of blood chemistry: Secondary | ICD-10-CM | POA: Diagnosis not present

## 2021-06-24 DIAGNOSIS — Z789 Other specified health status: Secondary | ICD-10-CM | POA: Diagnosis not present

## 2021-06-24 DIAGNOSIS — R1312 Dysphagia, oropharyngeal phase: Secondary | ICD-10-CM | POA: Diagnosis present

## 2021-06-24 DIAGNOSIS — Z431 Encounter for attention to gastrostomy: Secondary | ICD-10-CM | POA: Diagnosis not present

## 2021-06-24 DIAGNOSIS — R4182 Altered mental status, unspecified: Secondary | ICD-10-CM

## 2021-06-24 DIAGNOSIS — Z79899 Other long term (current) drug therapy: Secondary | ICD-10-CM | POA: Diagnosis not present

## 2021-06-24 DIAGNOSIS — Z743 Need for continuous supervision: Secondary | ICD-10-CM | POA: Diagnosis not present

## 2021-06-24 DIAGNOSIS — A419 Sepsis, unspecified organism: Secondary | ICD-10-CM | POA: Diagnosis not present

## 2021-06-24 DIAGNOSIS — E440 Moderate protein-calorie malnutrition: Secondary | ICD-10-CM | POA: Insufficient documentation

## 2021-06-24 DIAGNOSIS — R29898 Other symptoms and signs involving the musculoskeletal system: Secondary | ICD-10-CM | POA: Diagnosis not present

## 2021-06-24 DIAGNOSIS — R29810 Facial weakness: Secondary | ICD-10-CM | POA: Diagnosis present

## 2021-06-24 DIAGNOSIS — H5340 Unspecified visual field defects: Secondary | ICD-10-CM | POA: Diagnosis present

## 2021-06-24 DIAGNOSIS — Z66 Do not resuscitate: Secondary | ICD-10-CM | POA: Diagnosis present

## 2021-06-24 DIAGNOSIS — R471 Dysarthria and anarthria: Secondary | ICD-10-CM | POA: Diagnosis present

## 2021-06-24 DIAGNOSIS — Z7401 Bed confinement status: Secondary | ICD-10-CM | POA: Diagnosis not present

## 2021-06-24 DIAGNOSIS — I69391 Dysphagia following cerebral infarction: Secondary | ICD-10-CM | POA: Diagnosis not present

## 2021-06-24 DIAGNOSIS — I6602 Occlusion and stenosis of left middle cerebral artery: Secondary | ICD-10-CM | POA: Diagnosis not present

## 2021-06-24 DIAGNOSIS — I700 Atherosclerosis of aorta: Secondary | ICD-10-CM | POA: Diagnosis not present

## 2021-06-24 DIAGNOSIS — Z833 Family history of diabetes mellitus: Secondary | ICD-10-CM

## 2021-06-24 DIAGNOSIS — R41841 Cognitive communication deficit: Secondary | ICD-10-CM | POA: Diagnosis not present

## 2021-06-24 DIAGNOSIS — Z7902 Long term (current) use of antithrombotics/antiplatelets: Secondary | ICD-10-CM

## 2021-06-24 DIAGNOSIS — I6381 Other cerebral infarction due to occlusion or stenosis of small artery: Secondary | ICD-10-CM | POA: Diagnosis not present

## 2021-06-24 DIAGNOSIS — Z515 Encounter for palliative care: Secondary | ICD-10-CM | POA: Diagnosis not present

## 2021-06-24 DIAGNOSIS — I6521 Occlusion and stenosis of right carotid artery: Secondary | ICD-10-CM | POA: Diagnosis present

## 2021-06-24 DIAGNOSIS — Z7189 Other specified counseling: Secondary | ICD-10-CM

## 2021-06-24 DIAGNOSIS — I6389 Other cerebral infarction: Secondary | ICD-10-CM | POA: Diagnosis not present

## 2021-06-24 DIAGNOSIS — E7800 Pure hypercholesterolemia, unspecified: Secondary | ICD-10-CM | POA: Diagnosis present

## 2021-06-24 DIAGNOSIS — I672 Cerebral atherosclerosis: Secondary | ICD-10-CM | POA: Diagnosis not present

## 2021-06-24 DIAGNOSIS — K8020 Calculus of gallbladder without cholecystitis without obstruction: Secondary | ICD-10-CM | POA: Diagnosis not present

## 2021-06-24 DIAGNOSIS — R414 Neurologic neglect syndrome: Secondary | ICD-10-CM | POA: Diagnosis present

## 2021-06-24 DIAGNOSIS — R1310 Dysphagia, unspecified: Secondary | ICD-10-CM | POA: Diagnosis not present

## 2021-06-24 DIAGNOSIS — Z043 Encounter for examination and observation following other accident: Secondary | ICD-10-CM | POA: Diagnosis not present

## 2021-06-24 DIAGNOSIS — R6889 Other general symptoms and signs: Secondary | ICD-10-CM | POA: Diagnosis not present

## 2021-06-24 DIAGNOSIS — R001 Bradycardia, unspecified: Secondary | ICD-10-CM | POA: Diagnosis not present

## 2021-06-24 DIAGNOSIS — M6282 Rhabdomyolysis: Secondary | ICD-10-CM | POA: Diagnosis not present

## 2021-06-24 DIAGNOSIS — Z91013 Allergy to seafood: Secondary | ICD-10-CM

## 2021-06-24 DIAGNOSIS — Z8249 Family history of ischemic heart disease and other diseases of the circulatory system: Secondary | ICD-10-CM

## 2021-06-24 DIAGNOSIS — I69351 Hemiplegia and hemiparesis following cerebral infarction affecting right dominant side: Secondary | ICD-10-CM | POA: Diagnosis not present

## 2021-06-24 DIAGNOSIS — I639 Cerebral infarction, unspecified: Secondary | ICD-10-CM | POA: Diagnosis not present

## 2021-06-24 DIAGNOSIS — Z7982 Long term (current) use of aspirin: Secondary | ICD-10-CM

## 2021-06-24 DIAGNOSIS — R2689 Other abnormalities of gait and mobility: Secondary | ICD-10-CM | POA: Diagnosis not present

## 2021-06-24 DIAGNOSIS — R404 Transient alteration of awareness: Secondary | ICD-10-CM | POA: Diagnosis not present

## 2021-06-24 DIAGNOSIS — Z83438 Family history of other disorder of lipoprotein metabolism and other lipidemia: Secondary | ICD-10-CM

## 2021-06-24 DIAGNOSIS — I161 Hypertensive emergency: Secondary | ICD-10-CM | POA: Diagnosis not present

## 2021-06-24 DIAGNOSIS — W19XXXS Unspecified fall, sequela: Secondary | ICD-10-CM | POA: Diagnosis not present

## 2021-06-24 LAB — CBC
HCT: 39 % (ref 36.0–46.0)
Hemoglobin: 11.8 g/dL — ABNORMAL LOW (ref 12.0–15.0)
MCH: 25.8 pg — ABNORMAL LOW (ref 26.0–34.0)
MCHC: 30.3 g/dL (ref 30.0–36.0)
MCV: 85.3 fL (ref 80.0–100.0)
Platelets: 263 10*3/uL (ref 150–400)
RBC: 4.57 MIL/uL (ref 3.87–5.11)
RDW: 14.2 % (ref 11.5–15.5)
WBC: 4.9 10*3/uL (ref 4.0–10.5)
nRBC: 0 % (ref 0.0–0.2)

## 2021-06-24 LAB — BRAIN NATRIURETIC PEPTIDE: B Natriuretic Peptide: 157.6 pg/mL — ABNORMAL HIGH (ref 0.0–100.0)

## 2021-06-24 LAB — COMPREHENSIVE METABOLIC PANEL
ALT: 6 U/L (ref 0–44)
AST: 16 U/L (ref 15–41)
Albumin: 4 g/dL (ref 3.5–5.0)
Alkaline Phosphatase: 49 U/L (ref 38–126)
Anion gap: 12 (ref 5–15)
BUN: 15 mg/dL (ref 8–23)
CO2: 21 mmol/L — ABNORMAL LOW (ref 22–32)
Calcium: 9.3 mg/dL (ref 8.9–10.3)
Chloride: 105 mmol/L (ref 98–111)
Creatinine, Ser: 1.06 mg/dL — ABNORMAL HIGH (ref 0.44–1.00)
GFR, Estimated: 54 mL/min — ABNORMAL LOW (ref >60)
Glucose, Bld: 81 mg/dL (ref 70–99)
Potassium: 4.1 mmol/L (ref 3.5–5.1)
Sodium: 138 mmol/L (ref 135–145)
Total Bilirubin: 1.1 mg/dL (ref 0.3–1.2)
Total Protein: 6.9 g/dL (ref 6.5–8.1)

## 2021-06-24 LAB — URINALYSIS, ROUTINE W REFLEX MICROSCOPIC
Bilirubin Urine: NEGATIVE
Glucose, UA: 50 mg/dL — AB
Hgb urine dipstick: NEGATIVE
Ketones, ur: NEGATIVE mg/dL
Leukocytes,Ua: NEGATIVE
Nitrite: NEGATIVE
Protein, ur: NEGATIVE mg/dL
Specific Gravity, Urine: 1.005 (ref 1.005–1.030)
pH: 8 (ref 5.0–8.0)

## 2021-06-24 LAB — APTT: aPTT: 30 seconds (ref 24–36)

## 2021-06-24 LAB — AMMONIA: Ammonia: 33 umol/L (ref 9–35)

## 2021-06-24 LAB — TROPONIN I (HIGH SENSITIVITY)
Troponin I (High Sensitivity): 11 ng/L (ref <18)
Troponin I (High Sensitivity): 10 ng/L (ref <18)

## 2021-06-24 LAB — PROTIME-INR
INR: 1 (ref 0.8–1.2)
Prothrombin Time: 13.1 seconds (ref 11.4–15.2)

## 2021-06-24 LAB — LACTIC ACID, PLASMA
Lactic Acid, Venous: 2 mmol/L (ref 0.5–1.9)
Lactic Acid, Venous: 0.8 mmol/L (ref 0.5–1.9)

## 2021-06-24 LAB — CK: Total CK: 78 U/L (ref 38–234)

## 2021-06-24 LAB — CBG MONITORING, ED: Glucose-Capillary: 82 mg/dL (ref 70–99)

## 2021-06-24 MED ORDER — ENOXAPARIN SODIUM 40 MG/0.4ML IJ SOSY
40.0000 mg | PREFILLED_SYRINGE | INTRAMUSCULAR | Status: AC
  Administered 2021-06-24 – 2021-07-04 (×11): 40 mg via SUBCUTANEOUS
  Filled 2021-06-24 (×11): qty 0.4

## 2021-06-24 MED ORDER — EZETIMIBE 10 MG PO TABS
10.0000 mg | ORAL_TABLET | Freq: Every day | ORAL | Status: DC
  Administered 2021-06-26: 10 mg via ORAL
  Filled 2021-06-24: qty 1

## 2021-06-24 MED ORDER — SODIUM CHLORIDE 0.9 % IV SOLN: INTRAVENOUS | Status: DC

## 2021-06-24 MED ORDER — CLOPIDOGREL BISULFATE 75 MG PO TABS: 75.0000 mg | ORAL_TABLET | Freq: Every day | ORAL | Status: DC

## 2021-06-24 MED ORDER — IOHEXOL 350 MG/ML SOLN
75.0000 mL | Freq: Once | INTRAVENOUS | Status: AC | PRN
  Administered 2021-06-24: 75 mL via INTRAVENOUS

## 2021-06-24 MED ORDER — ATORVASTATIN CALCIUM 10 MG PO TABS: 10.0000 mg | ORAL_TABLET | Freq: Every day | ORAL | Status: DC

## 2021-06-24 MED ORDER — ACETAMINOPHEN 325 MG PO TABS: 650.0000 mg | ORAL_TABLET | ORAL | Status: DC | PRN

## 2021-06-24 MED ORDER — ESCITALOPRAM OXALATE 10 MG PO TABS
10.0000 mg | ORAL_TABLET | Freq: Every day | ORAL | Status: DC
  Administered 2021-06-26: 10 mg via ORAL
  Filled 2021-06-24: qty 1

## 2021-06-24 MED ORDER — STROKE: EARLY STAGES OF RECOVERY BOOK: Freq: Once | Status: AC

## 2021-06-24 MED ORDER — ACETAMINOPHEN 650 MG RE SUPP: 650.0000 mg | RECTAL | Status: DC | PRN

## 2021-06-24 MED ORDER — MAGNESIUM HYDROXIDE 400 MG/5ML PO SUSP: 30.0000 mL | Freq: Every day | ORAL | Status: DC | PRN

## 2021-06-24 MED ORDER — ASPIRIN EC 81 MG PO TBEC: 81.0000 mg | DELAYED_RELEASE_TABLET | Freq: Every day | ORAL | Status: DC

## 2021-06-24 MED ORDER — SODIUM CHLORIDE 0.9 % IV BOLUS
500.0000 mL | Freq: Once | INTRAVENOUS | Status: AC
  Administered 2021-06-24: 500 mL via INTRAVENOUS

## 2021-06-24 MED ORDER — BISACODYL 10 MG RE SUPP: 10.0000 mg | RECTAL | Status: DC | PRN

## 2021-06-24 MED ORDER — MIRABEGRON ER 25 MG PO TB24
25.0000 mg | ORAL_TABLET | Freq: Every day | ORAL | Status: DC
  Administered 2021-06-26: 25 mg via ORAL
  Filled 2021-06-24 (×2): qty 1

## 2021-06-24 MED ORDER — BUPROPION HCL ER (XL) 150 MG PO TB24
150.0000 mg | ORAL_TABLET | Freq: Every day | ORAL | Status: DC
  Administered 2021-06-26: 150 mg via ORAL
  Filled 2021-06-24 (×2): qty 1

## 2021-06-24 MED ORDER — SENNOSIDES-DOCUSATE SODIUM 8.6-50 MG PO TABS: 1.0000 | ORAL_TABLET | Freq: Every evening | ORAL | Status: DC | PRN

## 2021-06-24 MED ORDER — ACETAMINOPHEN 160 MG/5ML PO SOLN: 650.0000 mg | ORAL | Status: DC | PRN

## 2021-06-24 MED ORDER — HYDRALAZINE HCL 20 MG/ML IJ SOLN
10.0000 mg | Freq: Once | INTRAMUSCULAR | Status: DC
Start: 2021-06-24 — End: 2021-06-24

## 2021-06-24 MED ORDER — HYDRALAZINE HCL 20 MG/ML IJ SOLN
5.0000 mg | Freq: Four times a day (QID) | INTRAMUSCULAR | Status: DC | PRN
Start: 2021-06-24 — End: 2021-06-25
  Administered 2021-06-24 – 2021-06-25 (×2): 5 mg via INTRAVENOUS
  Filled 2021-06-24 (×2): qty 1

## 2021-06-24 MED ORDER — PANTOPRAZOLE SODIUM 20 MG PO TBEC
20.0000 mg | DELAYED_RELEASE_TABLET | Freq: Every day | ORAL | Status: DC
  Administered 2021-06-26: 20 mg via ORAL
  Filled 2021-06-24 (×3): qty 1

## 2021-06-24 NOTE — H&P (Signed)
?History and Physical  ? ? ?[name] [id number] DOB: 1941/02/04 DOA: 06/24/2021 ? ?PCP: [name]., MD (Confirm with patient/family/NH records and if not entered, this has to be entered at [facility] point of entry) ?Patient coming from: Home ? ?I have personally briefly reviewed patient's old medical records in [place] ? ?Chief Complaint: Patient not talking ? ?HPI: [name] is a 81 y.o. female with medical history significant of HTN, remote stroke with residual left-sided weakness, anxiety/depression, HLD, sent by daughter for fall this afternoon.  ? ?Last known normal was around 10 AM today.  Daughter found patient on the floor this afternoon around 1 PM.  Patient obviously unable to talk but EMS arrived, EMS interviewed the daughter on scene but found little additional information.  EMS also found the patient's pill bottles not organized and some of her BP meds pill boxes full with expiration date already passed. ? ?ED Course: Blood pressure significant elevated, SBP> 220.  Patient has a significant right-sided weakness and left facial droop as well as left neglect. ? ?Initial CT head negative for acute findings ? ?Review of Systems: Unable to perform, patient not talking. ? ?Past Medical History:  ?Diagnosis Date  ? Arthritis   ? Depression   ? High cholesterol   ? Hypertension   ? Pneumonia   ? Stroke [facility])   ? 1994 no residuals  ? ? ?Past Surgical History:  ?Procedure Laterality Date  ? ABDOMINAL HYSTERECTOMY    ? catheterization of the left superficial femoral artery  10/20/2009  ? SHOULDER ARTHROSCOPY WITH ROTATOR CUFF REPAIR AND SUBACROMIAL DECOMPRESSION Left 01/27/2018  ? Procedure: LEFT SHOULDER ARTHROSCOPY WITH DEBRIDEMENT, SUBACROMIAL DECOMPRESSION AND ROTATOR CUFF REPAIR;  Surgeon: Mcarthur Rossetti, MD;  Location: [facility] ORS;  Service: Orthopedics;  Laterality: Left;  ? SHOULDER SURGERY    ? Left with dr. Felicie Morn arthroscopy with possible rotator cuff repair  ? TONSILLECTOMY     ? ? ? reports that she quit smoking about 3 years ago. Her smoking use included cigarettes. She smoked an average of .5 packs per day. She has never used smokeless tobacco. She reports that she does not currently use alcohol. She reports that she does not use drugs. ? ?Allergies  ?Allergen Reactions  ? Atorvastatin   ?  Other reaction(s): myalgias  ? Celecoxib   ?  Other reaction(s): chest pain  ? Ezetimibe   ?  Other reaction(s): GI side effects  ? Penicillin G   ?  Other reaction(s): swelling  ? Penicillins Swelling and Other (See Comments)  ?  Has patient had a PCN reaction causing immediate rash, facial/tongue/throat swelling, SOB or lightheadedness with hypotension: Yes ?Has patient had a PCN reaction causing severe rash involving mucus membranes or skin necrosis: No ?Has patient had a PCN reaction that required hospitalization: No ?Has patient had a PCN reaction occurring within the last 10 years: No ?If all of the above answers are "NO", then may proceed with Cephalosporin use. ?  ? Rosuvastatin   ?  Other reaction(s): myalgias  ? Shellfish Allergy Itching and Swelling  ? Zoloft [Sertraline] Rash  ? ? ?Family History  ?Problem Relation Age of Onset  ? Cancer Mother   ? Diabetes Mother   ? Heart disease Mother   ? Hypertension Mother   ? Heart attack Mother   ? Hyperlipidemia Mother   ? Hypertension Father   ? Diabetes Sister   ? Heart disease Sister   ? Hyperlipidemia  Sister   ? Hypertension Sister   ? Cancer Sister   ? Diabetes Brother   ? Heart disease Brother   ? Hypertension Brother   ? Cancer Brother   ? Hyperlipidemia Brother   ? Heart disease Son   ? Heart attack Son   ?     amputation  ? ? ? ?Prior to Admission medications   ?Medication Sig Start Date End Date Taking? Authorizing Provider  ?albuterol (PROAIR HFA) 108 (90 Base) MCG/ACT inhaler 2 puffs every 6 hours as needed for wheezing    [provider]  ?amLODipine (NORVASC) 10 MG tablet Take 1 tablet (10 mg total) by mouth daily. 03/26/20    Medina-Vargas, Monina C, NP  ?amLODipine (NORVASC) 10 MG tablet Take 1 tablet    [provider]  ?aspirin 81 MG EC tablet Take one (1) tablet by mouth daily    [provider]  ?atorvastatin (LIPITOR) 10 MG tablet Take 1 tablet (10 mg total) by mouth daily. 03/26/20   Medina-Vargas, Monina C, NP  ?bisacodyl (DULCOLAX) 10 MG suppository Place 10 mg rectally as needed for moderate constipation.    [provider]  ?buPROPion (WELLBUTRIN XL) 150 MG 24 hr tablet Take 1 tablet (150 mg total) by mouth daily. 03/26/20   Medina-Vargas, Monina C, NP  ?buPROPion (WELLBUTRIN XL) 150 MG 24 hr tablet Take 1 tablet by mouth daily.    [provider]  ?Calcium Carbonate-Vitamin D (CALCIUM 600+D HIGH POTENCY PO) Take 1 tablet by mouth daily.     [provider]  ?Cholecalciferol (VITAMIN D) 2000 UNITS tablet Take 2,000 Units by mouth daily.    [provider]  ?clopidogrel (PLAVIX) 75 MG tablet Take 1 tablet (75 mg total) by mouth daily. 03/26/20   Medina-Vargas, Monina C, NP  ?clopidogrel (PLAVIX) 75 MG tablet Take 1 tablet by mouth daily.    [provider]  ?Elastic Bandages & Supports (MEDICAL COMPRESSION STOCKINGS) MISC 20-30mmHg knee high compression stockings 11/20/14   [provider]  ?escitalopram (LEXAPRO) 10 MG tablet 1  qam 03/26/20   Medina-Vargas, Monina C, NP  ?ezetimibe (ZETIA) 10 MG tablet Take 1 tablet (10 mg total) by mouth daily. 03/26/20   Medina-Vargas, Monina C, NP  ?hydrALAZINE (APRESOLINE) 25 MG tablet Take 1 tablet by mouth 3 (three) times daily. 09/24/20   [provider]  ?hydrochlorothiazide (HYDRODIURIL) 25 MG tablet Take 1 tablet by mouth daily. 09/24/20   [provider]  ?HYDROcodone-acetaminophen (NORCO/VICODIN) 5-325 MG tablet Take 1-2 Tablets by mouth every 4hrs as needed for moderate pain 01/31/18   [provider]  ?lisinopril (ZESTRIL) 40 MG tablet Take 1 tablet by mouth daily. 10/08/20   [provider]  ?magnesium hydroxide (MILK OF MAGNESIA) 400 MG/5ML suspension Take 30 mLs by mouth daily as needed for mild constipation.    [provider]  ?metoprolol tartrate (LOPRESSOR) 50 MG tablet Take 1 tablet (50 mg total) by mouth daily. 03/26/20   Medina-Vargas, Monina C, NP  ?mirabegron ER (MYRBETRIQ) 25 MG TB24 tablet Take 1 tablet (25 mg total) by mouth daily. 03/26/20   Medina-Vargas, Monina C, NP  ?pantoprazole (PROTONIX) 20 MG tablet Take 20 mg by mouth daily.    [provider]  ?Sodium Phosphates (RA SALINE ENEMA RE) Place 1 each rectally daily as needed.    [provider]  ? ? ?Physical Exam: ?Vitals:  ? 06/24/21 1630 06/24/21 1645 06/24/21 1700 06/24/21 1715  ?BP: (!) 212/106 (!) 214/94 Marland Kitchen)  195/100 (!) 211/159  ?Pulse:    62  ?Resp: 18 20 19 16   ?Temp:      ?TempSrc:      ?SpO2:    99%  ?Weight:      ?Height:      ? ? ?Constitutional: NAD, calm, comfortable ?Vitals:  ? 06/24/21 1630 06/24/21 1645 06/24/21 1700 06/24/21 1715  ?BP: (!) 212/106 (!) 214/94 (!) 195/100 (!) 211/159  ?Pulse:    62  ?Resp: 18 20 19 16   ?Temp:      ?TempSrc:      ?SpO2:    99%  ?Weight:      ?Height:      ? ?Eyes: PERRL, lids and conjunctivae normal ?ENMT: Mucous membranes are moist. Posterior pharynx clear of any exudate or lesions.Normal dentition.  ?Neck: normal, supple, no masses, no thyromegaly ?Respiratory: clear to auscultation bilaterally, no wheezing, no crackles. Normal respiratory effort. No accessory muscle use.  ?Cardiovascular: Regular rate and rhythm, no murmurs / rubs / gallops. No extremity edema. 2+ pedal pulses. No carotid bruits.  ?Abdomen: no tenderness, no masses palpated. No hepatosplenomegaly. Bowel sounds positive.  ?Musculoskeletal: no clubbing / cyanosis. No joint deformity upper and lower extremities. Good ROM, no contractures. Normal muscle tone.  ?Skin: no rashes, lesions, ulcers. No induration ?Neurologic: Slight left-sided facial droop and right-sided neglect, not  moving right arm, rest of to the neuro exam cannot be performed due to lack of cooperation ?Psychiatric: Normal judgment and insight. Alert and oriented x 3. Normal mood.  ? ? ? ?Labs on Admission: I have personal

## 2021-06-24 NOTE — ED Provider Notes (Addendum)
Care transferred from [place], [place]. See note for full HPI.  ? ?In summation, 81 year old with long hx here for evaluation of AMS and fall. ? ?AMS, unsure baseline, Per EMS full pill packets at home with various dates, does not seem to be taking meds ?LKN 10am yesterday ?Long Hx, neuro 2021 with chronic LEFT def. Possible ight sided weakness and left gaze today unsure of baseline per EMS ? ?Previous provider attempted to contact emergency contact without reaching anyone. ? ?FU on labs, imaging suspect admit for AMS ? ? ? ?Physical Exam  ?BP (!) 211/159   Pulse 62   Temp (S) 97.9 ?F (36.6 ?C) (Rectal)   Resp 16   Ht 5\' 3"  (1.6 m)   Wt 72.6 kg   SpO2 99%   BMI 28.34 kg/m?  ? ?Physical Exam ?Vitals and nursing note reviewed.  ?Constitutional:   ?   General: She is not in acute distress. ?   Appearance: She is well-developed. She is ill-appearing.  ?   Comments: Chronically ill appearing  ?HENT:  ?   Head: Normocephalic and atraumatic.  ?   Nose: Nose normal.  ?   Mouth/Throat:  ?   Mouth: Mucous membranes are dry.  ?Eyes:  ?   Pupils: Pupils are equal, round, and reactive to light.  ?   Comments: Left sided gaze however does occasional cross midline  ?Neck:  ?   Comments: No midline tenderness ?Cardiovascular:  ?   Rate and Rhythm: Normal rate.  ?Pulmonary:  ?   Effort: Pulmonary effort is normal. No respiratory distress.  ?   Breath sounds: Normal breath sounds.  ?   Comments: Clear bil, aphasic  ?Abdominal:  ?   General: Bowel sounds are normal. There is no distension.  ?   Palpations: Abdomen is soft.  ?   Tenderness: There is no abdominal tenderness. There is no guarding or rebound.  ?   Comments: Soft non tender  ?Musculoskeletal:     ?   General: Normal range of motion.  ?   Cervical back: Normal range of motion.  ?   Comments: No obvious deformity, no shortening or rotation of legs, wigges toes ?Attempt to ROM all 4 extremities however with obvious unilateral weakness  ?Skin: ?   General: Skin is warm  and dry.  ?   Capillary Refill: Capillary refill takes less than 2 seconds.  ?   Comments: No obvious rash or lesions  ?Neurological:  ?   Mental Status: She is lethargic and confused.  ?   Cranial Nerves: Cranial nerve deficit and facial asymmetry present.  ?   Motor: Weakness present.  ?   Comments: Right sided facial droop with slight increase in right sided weakness ?Diffuse global weakness worse to right> Left ?Aphasic ?Unable to assess drift, heel to shin  ?Psychiatric:     ?   Mood and Affect: Mood normal.  ? ?Procedures  ?Mar[facility] KitchenCritical Care ?Performed by: Nettie Elm, PA-C ?Authorized by: Nettie Elm, PA-C  ? ?Critical care provider statement:  ?  Critical care time (minutes):  35 ?  Critical care was necessary to treat or prevent imminent or life-threatening deterioration of the following conditions:  CNS failure or compromise ?  Critical care was time spent personally by me on the following activities:  Development of treatment plan with patient or surrogate, discussions with consultants, evaluation of patient's response to treatment, examination of patient, ordering and review of laboratory studies, ordering and  review of radiographic studies, ordering and performing treatments and interventions, pulse oximetry, re-evaluation of patient's condition and review of old charts ?Labs Reviewed  ?COMPREHENSIVE METABOLIC PANEL - Abnormal; Notable for the following components:  ?    Result Value  ? CO2 21 (*)   ? Creatinine, Ser 1.06 (*)   ? GFR, Estimated 54 (*)   ? All other components within normal limits  ?BRAIN NATRIURETIC PEPTIDE - Abnormal; Notable for the following components:  ? B Natriuretic Peptide 157.6 (*)   ? All other components within normal limits  ?LACTIC ACID, PLASMA - Abnormal; Notable for the following components:  ? Lactic Acid, Venous 2.0 (*)   ? All other components within normal limits  ?URINALYSIS, ROUTINE W REFLEX MICROSCOPIC - Abnormal; Notable for the following components:  ?  Color, Urine STRAW (*)   ? Glucose, UA 50 (*)   ? All other components within normal limits  ?CBC - Abnormal; Notable for the following components:  ? Hemoglobin 11.8 (*)   ? MCH 25.8 (*)   ? All other components within normal limits  ?CULTURE, BLOOD (ROUTINE X 2)  ?CULTURE, BLOOD (ROUTINE X 2)  ?AMMONIA  ?PROTIME-INR  ?APTT  ?CK  ?LACTIC ACID, PLASMA  ?CBG MONITORING, ED  ?TROPONIN I (HIGH SENSITIVITY)  ?TROPONIN I (HIGH SENSITIVITY)  ? DG Chest 2 View ? ?Result Date: 06/24/2021 ?CLINICAL DATA:  Sepsis EXAM: CHEST - 2 VIEW COMPARISON:  02/29/2020 FINDINGS: Frontal and lateral views of the chest demonstrate a stable mildly enlarged cardiac silhouette. No airspace disease, effusion, or pneumothorax. No acute bony abnormalities. IMPRESSION: 1. No acute intrathoracic process. Electronically Signed   By: Randa Ngo M.D.   On: 06/24/2021 16:02  ? ?DG Pelvis 1-2 Views ? ?Result Date: 06/24/2021 ?CLINICAL DATA:  [place], weakness, sepsis EXAM: PELVIS - 1-2 VIEW COMPARISON:  02/29/2020 FINDINGS: Supine frontal view of the pelvis excludes portions of the left greater trochanter by collimation. There are no acute displaced fractures. Stable bilateral hip osteoarthritis. Alignment is anatomic. Sacroiliac joints are normal. IMPRESSION: 1. Stable bilateral hip osteoarthritis. No acute displaced fracture. Electronically Signed   By: Randa Ngo M.D.   On: 06/24/2021 16:03  ? ?CT Head Wo Contrast ? ?Result Date: 06/24/2021 ?CLINICAL DATA:  Neuro deficit, acute, stroke suspected EXAM: CT HEAD WITHOUT CONTRAST TECHNIQUE: Contiguous axial images were obtained from the base of the skull through the vertex without intravenous contrast. RADIATION DOSE REDUCTION: This exam was performed according to the departmental dose-optimization program which includes automated exposure control, adjustment of the mA and/or kV according to patient size and/or use of iterative reconstruction technique. COMPARISON:  January 2022 FINDINGS: Brain: There is  no acute intracranial hemorrhage, mass effect, or edema. No new loss of gray-white differentiation. Multiple chronic infarcts are identified including involvement of right occipital lobe, bilateral central white matter and deep gray nuclei, and right cerebellum. Additional patchy and confluent hypoattenuation in the supratentorial white matter probably reflects stable chronic microvascular ischemic changes. Ventricles and sulci are stable in size and configuration. No extra-axial collection. Vascular: There is atherosclerotic calcification at the skull base. Skull: Calvarium is unremarkable. Sinuses/Orbits: No acute finding. Other: None. IMPRESSION: No acute intracranial abnormality. Stable chronic/nonemergent findings detailed above. Electronically Signed   By: Macy Mis M.D.   On: 06/24/2021 15:23  ? ?CT Cervical Spine Wo Contrast ? ?Result Date: 06/24/2021 ?CLINICAL DATA:  Fall today. EXAM: CT CERVICAL SPINE WITHOUT CONTRAST TECHNIQUE: Multidetector CT imaging of the cervical spine was performed without intravenous  contrast. Multiplanar CT image reconstructions were also generated. RADIATION DOSE REDUCTION: This exam was performed according to the departmental dose-optimization program which includes automated exposure control, adjustment of the mA and/or kV according to patient size and/or use of iterative reconstruction technique. COMPARISON:  CT cervical spine 02/29/2020. MRI cervical spine 09/27/2015 FINDINGS: Alignment: Normal Skull base and vertebrae: Negative for fracture Soft tissues and spinal canal: Atherosclerotic calcification in the carotid artery bilaterally. Right thyroid nodule 2.3 cm. Disc levels: Multilevel disc and facet degeneration. Facet degeneration most prominent on the right C2 through C6. Disc degeneration and spurring most prominent at C5-6 with spinal and foraminal stenosis similar to the prior study. Upper chest: Lung apices clear bilaterally. Other: None IMPRESSION: Cervical  spondylosis.  Negative for fracture. Electronically Signed   By: Franchot Gallo M.D.   On: 06/24/2021 16:25    ?ED Course / MDM  ?  ?81 year old here for evaluation of fall and AMX, LKN 1000 yesterday, 36 hours P

## 2021-06-24 NOTE — Consult Note (Addendum)
?                    NEURO HOSPITALIST CONSULT NOTE  ? ?Requestig physician: Dr. Roosevelt Locks ? ?Reason for Consult: New onset of right sided weakness with left sided gaze preference ? ?History obtained from:  Chart    ? ?HPI:                                                                                                                                         ? [name] is an 81 y.o. female with a PMHx of depression, arthritis, hypercholesterolemia, tobacco abuse, PVD, HTN, stroke in 1994 with left sided deficits and stroke in 2021 with residual dysphagia, left sided weakness and gait impairment who presented to the ED via EMS this afternoon after a mechanical trip and fall at home without striking her head, but with progressive lethargy afterwards in addition to new onset of right sided weakness and left sided gaze preference. LKN was 10 AM on Tuesday. Per EMS, her medication packets at home were poorly organized as well as being full, and level of medication adherence was questioned. She does have a history of being on Plavix. She was severely hypertensive with a BP per EMS of 234/116. Exam by EDP revealed patient to be poorly arousable and unable to adequately follow commands, with possible facial droop and pupillary asymmetry, but no other clear unilateral deficits.  ? ?Past Medical History:  ?Diagnosis Date  ? Arthritis   ? Depression   ? High cholesterol   ? Hypertension   ? Pneumonia   ? Stroke [facility])   ? 1994 no residuals  ? ? ?Past Surgical History:  ?Procedure Laterality Date  ? ABDOMINAL HYSTERECTOMY    ? catheterization of the left superficial femoral artery  10/20/2009  ? SHOULDER ARTHROSCOPY WITH ROTATOR CUFF REPAIR AND SUBACROMIAL DECOMPRESSION Left 01/27/2018  ? Procedure: LEFT SHOULDER ARTHROSCOPY WITH DEBRIDEMENT, SUBACROMIAL DECOMPRESSION AND ROTATOR CUFF REPAIR;  Surgeon: Mcarthur Rossetti, MD;  Location: [facility];  Service: Orthopedics;  Laterality: Left;  ? SHOULDER SURGERY    ? Left with dr.  Felicie Morn arthroscopy with possible rotator cuff repair  ? TONSILLECTOMY    ? ? ?Family History  ?Problem Relation Age of Onset  ? Cancer Mother   ? Diabetes Mother   ? Heart disease Mother   ? Hypertension Mother   ? Heart attack Mother   ? Hyperlipidemia Mother   ? Hypertension Father   ? Diabetes Sister   ? Heart disease Sister   ? Hyperlipidemia Sister   ? Hypertension Sister   ? Cancer Sister   ? Diabetes Brother   ? Heart disease Brother   ? Hypertension Brother   ? Cancer Brother   ? Hyperlipidemia Brother   ? Heart disease Son   ? Heart attack Son   ?     amputation  ?         ? ?  Social History:  reports that she quit smoking about 3 years ago. Her smoking use included cigarettes. She smoked an average of .5 packs per day. She has never used smokeless tobacco. She reports that she does not currently use alcohol. She reports that she does not use drugs. ? ?Allergies  ?Allergen Reactions  ? Atorvastatin   ?  Other reaction(s): myalgias  ? Celecoxib   ?  Other reaction(s): chest pain  ? Ezetimibe   ?  Other reaction(s): GI side effects  ? Penicillin G   ?  Other reaction(s): swelling  ? Penicillins Swelling and Other (See Comments)  ?  Has patient had a PCN reaction causing immediate rash, facial/tongue/throat swelling, SOB or lightheadedness with hypotension: Yes ?Has patient had a PCN reaction causing severe rash involving mucus membranes or skin necrosis: No ?Has patient had a PCN reaction that required hospitalization: No ?Has patient had a PCN reaction occurring within the last 10 years: No ?If all of the above answers are "NO", then may proceed with Cephalosporin use. ?  ? Rosuvastatin   ?  Other reaction(s): myalgias  ? Shellfish Allergy Itching and Swelling  ? Zoloft [Sertraline] Rash  ? ? ?MEDICATIONS:                                                                                                                     ?No current facility-administered medications on file prior to encounter.  ? ?Current  Outpatient Medications on File Prior to Encounter  ?Medication Sig Dispense Refill  ? albuterol (PROAIR HFA) 108 (90 Base) MCG/ACT inhaler 2 puffs every 6 hours as needed for wheezing    ? amLODipine (NORVASC) 10 MG tablet Take 1 tablet (10 mg total) by mouth daily. 30 tablet 0  ? amLODipine (NORVASC) 10 MG tablet Take 1 tablet    ? aspirin 81 MG EC tablet Take one (1) tablet by mouth daily    ? atorvastatin (LIPITOR) 10 MG tablet Take 1 tablet (10 mg total) by mouth daily. 30 tablet 0  ? bisacodyl (DULCOLAX) 10 MG suppository Place 10 mg rectally as needed for moderate constipation.    ? buPROPion (WELLBUTRIN XL) 150 MG 24 hr tablet Take 1 tablet (150 mg total) by mouth daily. 30 tablet 0  ? buPROPion (WELLBUTRIN XL) 150 MG 24 hr tablet Take 1 tablet by mouth daily.    ? Calcium Carbonate-Vitamin D (CALCIUM 600+D HIGH POTENCY PO) Take 1 tablet by mouth daily.     ? Cholecalciferol (VITAMIN D) 2000 UNITS tablet Take 2,000 Units by mouth daily.    ? clopidogrel (PLAVIX) 75 MG tablet Take 1 tablet (75 mg total) by mouth daily. 30 tablet 0  ? clopidogrel (PLAVIX) 75 MG tablet Take 1 tablet by mouth daily.    ? Elastic Bandages & Supports (MEDICAL COMPRESSION STOCKINGS) MISC 20-38mmHg knee high compression stockings    ? escitalopram (LEXAPRO) 10 MG tablet 1  qam 30 tablet 0  ? ezetimibe (ZETIA) 10 MG tablet Take 1  tablet (10 mg total) by mouth daily. 30 tablet 0  ? hydrALAZINE (APRESOLINE) 25 MG tablet Take 1 tablet by mouth 3 (three) times daily.    ? hydrochlorothiazide (HYDRODIURIL) 25 MG tablet Take 1 tablet by mouth daily.    ? HYDROcodone-acetaminophen (NORCO/VICODIN) 5-325 MG tablet Take 1-2 Tablets by mouth every 4hrs as needed for moderate pain    ? lisinopril (ZESTRIL) 40 MG tablet Take 1 tablet by mouth daily.    ? magnesium hydroxide (MILK OF MAGNESIA) 400 MG/5ML suspension Take 30 mLs by mouth daily as needed for mild constipation.    ? metoprolol tartrate (LOPRESSOR) 50 MG tablet Take 1 tablet (50 mg  total) by mouth daily. 30 tablet 0  ? mirabegron ER (MYRBETRIQ) 25 MG TB24 tablet Take 1 tablet (25 mg total) by mouth daily. 30 tablet 0  ? pantoprazole (PROTONIX) 20 MG tablet Take 20 mg by mouth daily.    ? Sodium Phosphates (RA SALINE ENEMA RE) Place 1 each rectally daily as needed.    ? ? ?Scheduled: ? aspirin EC  81 mg Oral Daily  ? atorvastatin  10 mg Oral Daily  ? [START ON 06/25/2021] buPROPion  150 mg Oral Daily  ? [START ON 06/25/2021] clopidogrel  75 mg Oral Daily  ? enoxaparin (LOVENOX) injection  40 mg Subcutaneous Q24H  ? [START ON 06/25/2021] escitalopram  10 mg Oral Daily  ? [START ON 06/25/2021] ezetimibe  10 mg Oral Daily  ? [START ON 06/25/2021] mirabegron ER  25 mg Oral Daily  ? pantoprazole  20 mg Oral Daily  ? ?Continuous: ? sodium chloride 100 mL/hr at 06/24/21 1843  ? ? ? ?ROS:                                                                                                                                       ?Unable to obtain a ROS due to severe dysarthria as well as bradyphrenia. ? ? ?Blood pressure (!) 218/104, pulse 65, temperature (S) 97.9 ?F (36.6 ?C), temperature source Rectal, resp. rate 11, height 5\' 3"  (1.6 m), weight 72.6 kg, SpO2 100 %. ? ? ?General Examination:                                                                                                      ? ?Physical Exam  ?HEENT-  [place]/AT  ?Lungs- Respirations unlabored ?Extremities- Warm and well perfused. Mild edema to distal BLE.  ? ?Neurological Examination ?Mental Status:  ?Bradyphrenic with slow  speech and mildly decreased level of alertness. Speech is severely dysarthric and dysfluent, with patient answering slowly with one-word statements only. She is oriented to the day of the week, year and month, as well as the city and state. She can name three fingers, count fingers and follow all motor commands. Attends more consistently to left sided stimuli than those on the right.  ?Cranial Nerves: ?II: Right sided visual field cut.    ?III,IV, VI: EOMI but with hesitation on gazing to the right. No nystagmus.  ?V: Subjectively with equal temperature sensation bilaterally ?VII: Right facial droop.  ?VIII: Hearing intact to voice ?IX,X: No

## 2021-06-24 NOTE — ED Notes (Signed)
Patient to MRI.

## 2021-06-24 NOTE — ED Provider Notes (Signed)
?[facility] ?Provider Note ? ? ?CSN: [id number] ?Arrival date & time: 06/24/21  1421 ? ?  ? ?History ? ?Chief Complaint  ?Patient presents with  ? Weakness  ? Hypertension  ? ? ?[name] is a 81 y.o. female with past medical history significant for hyperlipidemia, tobacco abuse, hypertension, peripheral vascular disease, previous stroke, history of left-sided deficits, bipolar disorder who presents via EMS after complaint by family of mechanical fall around 1 PM today.  Daughter denied that patient hit her head.  Patient reportedly just got back from around 3-day trip and was acting normal as of 10 AM yesterday.  EMS reports right-sided weakness, left-sided gaze.  They found medication packets at home with confused dates, dates of administration, unclear whether or not patient has been taking any of her medication.  She does have a history of being on Plavix.  Additionally patient arrives quite hypertensive with systolic [id number], diastolic [id number] by EMS.  CBG 133 with EMS. ? ? ?Weakness ?Hypertension ? ? ?  ? ?Home Medications ?Prior to Admission medications   ?Medication Sig Start Date End Date Taking? Authorizing Provider  ?albuterol (PROAIR HFA) 108 (90 Base) MCG/ACT inhaler 2 puffs every 6 hours as needed for wheezing    [provider]  ?amLODipine (NORVASC) 10 MG tablet Take 1 tablet (10 mg total) by mouth daily. 03/26/20   Medina-Vargas, Monina C, NP  ?amLODipine (NORVASC) 10 MG tablet Take 1 tablet    [provider]  ?aspirin 81 MG EC tablet Take one (1) tablet by mouth daily    [provider]  ?atorvastatin (LIPITOR) 10 MG tablet Take 1 tablet (10 mg total) by mouth daily. 03/26/20   Medina-Vargas, Monina C, NP  ?bisacodyl (DULCOLAX) 10 MG suppository Place 10 mg rectally as needed for moderate constipation.    [provider]  ?buPROPion (WELLBUTRIN XL) 150 MG 24 hr tablet Take 1 tablet (150 mg total) by mouth daily. 03/26/20   Medina-Vargas,  Monina C, NP  ?buPROPion (WELLBUTRIN XL) 150 MG 24 hr tablet Take 1 tablet by mouth daily.    [provider]  ?Calcium Carbonate-Vitamin D (CALCIUM 600+D HIGH POTENCY PO) Take 1 tablet by mouth daily.     [provider]  ?Cholecalciferol (VITAMIN D) 2000 UNITS tablet Take 2,000 Units by mouth daily.    [provider]  ?clopidogrel (PLAVIX) 75 MG tablet Take 1 tablet (75 mg total) by mouth daily. 03/26/20   Medina-Vargas, Monina C, NP  ?clopidogrel (PLAVIX) 75 MG tablet Take 1 tablet by mouth daily.    [provider]  ?Elastic Bandages & Supports (MEDICAL COMPRESSION STOCKINGS) MISC 20-31mmHg knee high compression stockings 11/20/14   [provider]  ?escitalopram (LEXAPRO) 10 MG tablet 1  qam 03/26/20   Medina-Vargas, Monina C, NP  ?ezetimibe (ZETIA) 10 MG tablet Take 1 tablet (10 mg total) by mouth daily. 03/26/20   Medina-Vargas, Monina C, NP  ?hydrALAZINE (APRESOLINE) 25 MG tablet Take 1 tablet by mouth 3 (three) times daily. 09/24/20   [provider]  ?hydrochlorothiazide (HYDRODIURIL) 25 MG tablet Take 1 tablet by mouth daily. 09/24/20   [provider]  ?HYDROcodone-acetaminophen (NORCO/VICODIN) 5-325 MG tablet Take 1-2 Tablets by mouth every 4hrs as needed for moderate pain 01/31/18   [provider]  ?lisinopril (ZESTRIL) 40 MG tablet Take 1 tablet by mouth daily. 10/08/20   [provider]  ?magnesium hydroxide (MILK OF MAGNESIA) 400 MG/5ML suspension Take 30 mLs by  mouth daily as needed for mild constipation.    [provider]  ?metoprolol tartrate (LOPRESSOR) 50 MG tablet Take 1 tablet (50 mg total) by mouth daily. 03/26/20   Medina-Vargas, Monina C, NP  ?mirabegron ER (MYRBETRIQ) 25 MG TB24 tablet Take 1 tablet (25 mg total) by mouth daily. 03/26/20   Medina-Vargas, Monina C, NP  ?pantoprazole (PROTONIX) 20 MG tablet Take 20 mg by mouth daily.    [provider]  ?Sodium Phosphates (RA SALINE ENEMA RE) Place 1 each  rectally daily as needed.    [provider]  ?   ? ?Allergies    ?Atorvastatin, Celecoxib, Ezetimibe, Penicillin g, Penicillins, Rosuvastatin, Shellfish allergy, and Zoloft [sertraline]   ? ?Review of Systems   ?Review of Systems  ?Neurological:  Positive for weakness.  ?All other systems reviewed and are negative. ? ?Physical Exam ?Updated Vital Signs ?BP (!) 221/97   Pulse 85   Temp (S) 97.9 ?F (36.6 ?C) (Rectal)   Resp 12   Ht 5\' 3"  (1.6 m)   Wt 72.6 kg   SpO2 99%   BMI 28.34 kg/m?  ?Physical Exam ?Vitals and nursing note reviewed.  ?Constitutional:   ?   General: She is not in acute distress. ?   Comments: Disheveled appearance, occasional yawning, snoring  ?HENT:  ?   Head: Normocephalic and atraumatic.  ?   Mouth/Throat:  ?   Mouth: Mucous membranes are dry.  ?   Comments: Some saliva leaking from right corner of mouth, oropharynx otherwise unremarkable somewhat dry appearance of lips and tongue ?Eyes:  ?   General:     ?   Right eye: No discharge.     ?   Left eye: No discharge.  ?Cardiovascular:  ?   Rate and Rhythm: Normal rate and regular rhythm.  ?   Heart sounds: No murmur heard. ?  No friction rub. No gallop.  ?Pulmonary:  ?   Effort: Pulmonary effort is normal. No respiratory distress.  ?   Breath sounds: Normal breath sounds.  ?   Comments: Occasional crackles, rhonchi, some tachypnea, otherwise overall normal respiratory effort, no acute distress ?Abdominal:  ?   General: Bowel sounds are normal.  ?   Palpations: Abdomen is soft.  ?Genitourinary: ?   Comments: No external vulvar lesions, ulcers, patient was incontinent of urine on arrival ?Musculoskeletal:     ?   General: No deformity.  ?Skin: ?   General: Skin is dry.  ?   Capillary Refill: Capillary refill takes less than 2 seconds.  ?   Comments: Somewhat cool to the touch, no ulcers or other wounds noted  ?Neurological:  ?   Mental Status: She is alert.  ?   Comments: Patient is poorly arousable, does open eyes and makes some  purposeful sound to sternal rub and loud questioning.  I do not know any obvious unilateral deficits without patient being able to adequately follow commands, respond.  She is not moving her limbs spontaneously at this time, but does have some muscular tone noted in bilateral arms and legs. ? ?No clear pupillary discrepancy in size. Possible apparent right sided facial droop.  ?Psychiatric:     ?   Mood and Affect: Mood normal.     ?   Behavior: Behavior normal.  ? ? ?ED Results / Procedures / Treatments   ?Labs ?(all labs ordered are listed, but only abnormal results are displayed) ?Labs Reviewed  ?CULTURE, BLOOD (ROUTINE X 2)  ?CULTURE,  BLOOD (ROUTINE X 2)  ?COMPREHENSIVE METABOLIC PANEL  ?BRAIN NATRIURETIC PEPTIDE  ?LACTIC ACID, PLASMA  ?LACTIC ACID, PLASMA  ?URINALYSIS, ROUTINE W REFLEX MICROSCOPIC  ?AMMONIA  ?CBC  ?PROTIME-INR  ?APTT  ?CBG MONITORING, ED  ? ? ?EKG ?EKG Interpretation ? ?Date/Time:  Wednesday Jun 24 2021 14:38:35 EDT ?Ventricular Rate:  61 ?PR Interval:  145 ?QRS Duration: 95 ?QT Interval:  421 ?QTC Calculation: 424 ?R Axis:   71 ?Text Interpretation: Sinus rhythm Probable left atrial enlargement LVH with secondary repolarization abnormality T wave abnormality Abnormal ECG Confirmed by Carmin Muskrat [id number]) on 06/24/2021 2:58:06 PM ? ?Radiology ?No results found. ? ?Procedures ?Procedures  ? ? ?Medications Ordered in ED ?Medications - No data to display ? ?ED Course/ Medical Decision Making/ A&P ?  ?                        ?Medical Decision Making ?Amount and/or Complexity of Data Reviewed ?Labs: ordered. ?Radiology: ordered. ? ?This patient presents to the ED for concern of altered mental status, mechanical fall, Hypothermia, lethargy, this involves an extensive number of treatment options, and is a complaint that carries with it a high risk of complications and morbidity. The emergent differential diagnosis prior to evaluation includes, but is not limited to, infection, neglect, stroke,  intracranial bleed, Electrolyte abnormality, vs other..  ? ?This is not an exhaustive differential.  ? ?Past Medical History / Co-morbidities / Social History: ?hyperlipidemia, tobacco abuse, hypertension, peripher

## 2021-06-24 NOTE — ED Triage Notes (Signed)
Per [facility] pt coming from home, called by daughter stating patient had a mechanical trip and fell around 1pm today. EMS denies pt hitting head. Patient becoming steadily lethargic throughout the day. Per family pt was LKN 10am yesterday. Patient having right sided weakness and left sided gaze. Report hx of stroke but unsure of any deficits. Unsure if pt has been taking medications.  ?

## 2021-06-25 ENCOUNTER — Inpatient Hospital Stay (HOSPITAL_COMMUNITY): Payer: Medicare HMO

## 2021-06-25 DIAGNOSIS — W19XXXA Unspecified fall, initial encounter: Secondary | ICD-10-CM

## 2021-06-25 DIAGNOSIS — I6389 Other cerebral infarction: Secondary | ICD-10-CM | POA: Diagnosis not present

## 2021-06-25 DIAGNOSIS — I679 Cerebrovascular disease, unspecified: Secondary | ICD-10-CM

## 2021-06-25 DIAGNOSIS — F419 Anxiety disorder, unspecified: Secondary | ICD-10-CM | POA: Diagnosis not present

## 2021-06-25 DIAGNOSIS — W19XXXS Unspecified fall, sequela: Secondary | ICD-10-CM | POA: Diagnosis not present

## 2021-06-25 DIAGNOSIS — I639 Cerebral infarction, unspecified: Secondary | ICD-10-CM

## 2021-06-25 DIAGNOSIS — R7989 Other specified abnormal findings of blood chemistry: Secondary | ICD-10-CM | POA: Diagnosis not present

## 2021-06-25 DIAGNOSIS — I161 Hypertensive emergency: Secondary | ICD-10-CM

## 2021-06-25 DIAGNOSIS — R531 Weakness: Secondary | ICD-10-CM

## 2021-06-25 LAB — CBG MONITORING, ED: Glucose-Capillary: 89 mg/dL (ref 70–99)

## 2021-06-25 LAB — ECHOCARDIOGRAM COMPLETE
AR max vel: 2.72 cm2
AV Area VTI: 2.84 cm2
AV Area mean vel: 2.49 cm2
AV Mean grad: 5 mmHg
AV Peak grad: 10 mmHg
Ao pk vel: 1.58 m/s
Area-P 1/2: 3.93 cm2
Calc EF: 56.7 %
Height: 63 in
S' Lateral: 2.03 cm
Single Plane A2C EF: 54.5 %
Single Plane A4C EF: 59.1 %
Weight: 2560 [oz_av]

## 2021-06-25 LAB — LIPID PANEL
Cholesterol: 132 mg/dL (ref 0–200)
HDL: 50 mg/dL (ref >40)
LDL Cholesterol: 70 mg/dL (ref 0–99)
Total CHOL/HDL Ratio: 2.6 RATIO
Triglycerides: 61 mg/dL (ref <150)
VLDL: 12 mg/dL (ref 0–40)

## 2021-06-25 MED ORDER — ENALAPRILAT 1.25 MG/ML IV SOLN
1.2500 mg | Freq: Once | INTRAVENOUS | Status: AC
  Administered 2021-06-25: 1.25 mg via INTRAVENOUS
  Filled 2021-06-25: qty 1

## 2021-06-25 MED ORDER — HYDRALAZINE HCL 20 MG/ML IJ SOLN
5.0000 mg | Freq: Four times a day (QID) | INTRAMUSCULAR | Status: DC | PRN
  Administered 2021-06-25: 5 mg via INTRAVENOUS
  Filled 2021-06-25: qty 1

## 2021-06-25 MED ORDER — HYDROCHLOROTHIAZIDE 25 MG PO TABS
25.0000 mg | ORAL_TABLET | Freq: Every day | ORAL | Status: DC
  Administered 2021-06-26: 25 mg via ORAL
  Filled 2021-06-25: qty 1

## 2021-06-25 MED ORDER — FUROSEMIDE 10 MG/ML IJ SOLN
40.0000 mg | Freq: Once | INTRAMUSCULAR | Status: AC
  Administered 2021-06-25: 40 mg via INTRAVENOUS
  Filled 2021-06-25: qty 4

## 2021-06-25 MED ORDER — ASPIRIN 81 MG PO CHEW
81.0000 mg | CHEWABLE_TABLET | Freq: Every day | ORAL | Status: DC
  Administered 2021-06-26: 81 mg via ORAL
  Filled 2021-06-25: qty 1

## 2021-06-25 MED ORDER — TICAGRELOR 90 MG PO TABS
90.0000 mg | ORAL_TABLET | Freq: Two times a day (BID) | ORAL | Status: DC
  Administered 2021-06-26 (×2): 90 mg via ORAL
  Filled 2021-06-25 (×3): qty 1

## 2021-06-25 MED ORDER — ASPIRIN 300 MG RE SUPP
300.0000 mg | Freq: Once | RECTAL | Status: AC
  Administered 2021-06-25: 300 mg via RECTAL
  Filled 2021-06-25: qty 1

## 2021-06-25 MED ORDER — HYDRALAZINE HCL 20 MG/ML IJ SOLN
10.0000 mg | Freq: Four times a day (QID) | INTRAMUSCULAR | Status: DC | PRN
  Administered 2021-06-25 – 2021-06-26 (×3): 10 mg via INTRAVENOUS
  Filled 2021-06-25 (×3): qty 1

## 2021-06-25 MED ORDER — LABETALOL HCL 5 MG/ML IV SOLN
10.0000 mg | INTRAVENOUS | Status: DC | PRN
  Administered 2021-06-25: 10 mg via INTRAVENOUS
  Filled 2021-06-25 (×2): qty 4

## 2021-06-25 MED ORDER — ASPIRIN 300 MG RE SUPP
300.0000 mg | Freq: Once | RECTAL | Status: DC
Start: 2021-06-25 — End: 2021-06-25

## 2021-06-25 MED ORDER — LABETALOL HCL 5 MG/ML IV SOLN
20.0000 mg | INTRAVENOUS | Status: DC | PRN
  Administered 2021-06-25 – 2021-06-26 (×3): 20 mg via INTRAVENOUS
  Filled 2021-06-25 (×2): qty 4

## 2021-06-25 MED ORDER — ORAL CARE MOUTH RINSE
15.0000 mL | Freq: Two times a day (BID) | OROMUCOSAL | Status: DC
  Administered 2021-06-25 – 2021-07-08 (×26): 15 mL via OROMUCOSAL

## 2021-06-25 MED ORDER — ATORVASTATIN CALCIUM 40 MG PO TABS
40.0000 mg | ORAL_TABLET | Freq: Every day | ORAL | Status: DC
  Administered 2021-06-26: 40 mg via ORAL
  Filled 2021-06-25: qty 1

## 2021-06-25 MED ORDER — ASPIRIN 300 MG RE SUPP: 300.0000 mg | Freq: Every day | RECTAL | Status: DC

## 2021-06-25 MED ORDER — ASPIRIN EC 325 MG PO TBEC: 325.0000 mg | DELAYED_RELEASE_TABLET | Freq: Every day | ORAL | Status: DC

## 2021-06-25 NOTE — Assessment & Plan Note (Signed)
PT eval.

## 2021-06-25 NOTE — Progress Notes (Signed)
SLP Cancellation Note ? ?[name] ?MRN: [id number] ?DOB: [birth year] ? ? ?Cancelled treatment:       Reason Eval/Treat Not Completed: Medical issues which prohibited therapy; pt's BP elevated, so MBS unable to be completed this date.  ST will f/u next date to assess pt's ability to participate in [place] completion. ? ? ?Elvina Sidle, M.S., CCC-SLP ?06/25/2021, 3:22 PM ?

## 2021-06-25 NOTE — ED Notes (Signed)
Patient's bed linens changed for comfort and patient repositioned in bed.  ?

## 2021-06-25 NOTE — Assessment & Plan Note (Addendum)
Initial BP >220/120 and encephalopathy and stroke. ? ?BP now improved, 130s-160s. ?- Continue amlodipine, hydralazine, HCTZ, lisinopril, metoprolol ? ?

## 2021-06-25 NOTE — Progress Notes (Signed)
?  Progress Note ? ? ?Patient: [name] [id number] DOB: 12-17-1940 DOA: 06/24/2021     1 ?DOS: the patient was seen and examined on 06/25/2021 at 8:20AM ?  ? ? ? ?Brief hospital course: ?Mrs. Ensminger is a 81 y.o. F with hx CVA residual left hemiparesis who presented after a fall and subsequent appearance of right hemiparesis and left gaze preference. ? ?In the ER, she had facial droop, decreased responsiveness.  BP 223/165 mmHg and MRI brain showed small L thalamic stroke. ? ? ? ? ? ? ?Assessment and Plan: ?* Acute ischemic stroke ([place]) ?MRI brain shows a small L thalamic stroke ?-Non-invasive angiography showed severe distal left M1 stenosis, 3 x 4 mm ACOM aneurysm, and 50% stenosis of the proximal right ICA  ?- Follow echo ?-Lipids ordered: LDL 70, continue atorvastatin, Zetia ?-Aspirin ordered at admission --> continue low-dose aspirin ?-Atrial fibrillation: not present on tele ? ? ?Hypertensive emergency ?In the presence of her stroke, we will clarify her blood pressure goals with neurology, but I expect we will do some permissive hypertension ?-Hold home amlodipine, hydralazine, HCTZ, lisinopril, metoprolol ?- For now, PRN hydralazine for SBP > 200, clarify with Neuro ? ?Anxiety ?- Continue SSRI, bupropion ? ?Cerebrovascular disease ?- Continue aspirin, atorvastatin, Zetia ? ?Elevated lactic acid level ?At this point I do not suspect that this elevated lactic acid is due to infection.  UA clear.  Chest x-ray clear. ? ? ? ? ? ? ? ? ? ?Subjective: Patient still slowed, dysarthric, but she is oriented to hospital, self, situation.  She denies changes in her right-sided weakness, no new focal findings.  No fever, respiratory distress, vomiting. ? ? ? ? ?Physical Exam: ?Vitals:  ? 06/25/21 0330 06/25/21 0500 06/25/21 0530 06/25/21 0700  ?BP: (!) 168/76 (!) 165/109 (!) 210/96 (!) 182/80  ?Pulse: 69 70 78 (!) 35  ?Resp: (!) 21 (!) 22 14 (!) 21  ?Temp:      ?TempSrc:      ?SpO2: 96% 98% 99% 94%  ?Weight:      ?Height:       ? ?Elderly adult female, lying in bed, sleeping. ?Her face is flaccid, unable to tell if the left or the right side is drooping, her speech is severely dysarthric still, she still has marked bradyphrenia/psychomotor slowing.  She is oriented to person and place.  Her upper extremity strength is bilaterally weak, I can appreciate no differences.  Extraocular movements intact. ?RRR, no murmurs, no peripheral edema ?Respiratory rate normal, lungs clear without rales or wheezes ?Abdomen soft no tenderness palpation or guarding. ? ? ? ? ? ?Data Reviewed: ?Discussed with neurology, nursing notes reviewed, vital signs reviewed. ?Creatinine 1.06, stable relative to baseline ?Urinalysis without cells ?CT head unremarkable ?BNP 157 ?CK and troponin normal ?Chest x-ray clear ?CBC normal ?CT angiogram shows several high-grade stenoses as described above ? ?Family Communication:  ? ? ? ?Disposition: ?Status is: Inpatient ?The patient was admitted with acute ischemic stroke, she is presently not able to swallow or stand, will need significant rehabilitation return to her prior level of function. ? ? ? ? ? ? ? ? ? ? ? ? ?Author: ?Edwin Dada, MD ?06/25/2021 8:46 AM ? ?For on call review [email or web address].  ? ? ?

## 2021-06-25 NOTE — Assessment & Plan Note (Addendum)
Family and nursing and I have some concern for depressive, apathetic reaction to this illness. ?- Continue home bupropion as short acting (prior med held given couldn't crush) ?- Continue escitalopram, dose increased here ?

## 2021-06-25 NOTE — Evaluation (Signed)
Clinical/Bedside Swallow Evaluation ?Patient Details  ?Name: [name] ?MRN: [id number] ?Date of Birth: September 30, 1940 ? ?Today's Date: 06/25/2021 ?Time: SLP Start Time (ACUTE ONLY): 0940 SLP Stop Time (ACUTE ONLY): 1008 ?SLP Time Calculation (min) (ACUTE ONLY): 28 min ? ?Past Medical History:  ?Past Medical History:  ?Diagnosis Date  ? Arthritis   ? Depression   ? High cholesterol   ? Hypertension   ? Pneumonia   ? Stroke [facility])   ? 1994 no residuals  ? ?Past Surgical History:  ?Past Surgical History:  ?Procedure Laterality Date  ? ABDOMINAL HYSTERECTOMY    ? catheterization of the left superficial femoral artery  10/20/2009  ? SHOULDER ARTHROSCOPY WITH ROTATOR CUFF REPAIR AND SUBACROMIAL DECOMPRESSION Left 01/27/2018  ? Procedure: LEFT SHOULDER ARTHROSCOPY WITH DEBRIDEMENT, SUBACROMIAL DECOMPRESSION AND ROTATOR CUFF REPAIR;  Surgeon: Mcarthur Rossetti, MD;  Location: [facility];  Service: Orthopedics;  Laterality: Left;  ? SHOULDER SURGERY    ? Left with dr. Felicie Morn arthroscopy with possible rotator cuff repair  ? TONSILLECTOMY    ? ?HPI:  ?81 y.o. female with medical history significant of HTN, remote stroke with residual left-sided weakness, anxiety/depression, HLD, sent by daughter for fall this afternoon.      Last known normal was around 10 AM today.  Daughter found patient on the floor this afternoon around 1 PM.  Patient obviously unable to talk but EMS arrived, EMS interviewed the daughter on scene but found little additional information.  EMS also found the patient's pill bottles not organized and some of her BP meds pill boxes full with expiration date already passed; MRI head on 06/25/21 indicated small acute infarct lateral L thalamus; old occipital infarct; BSE/SLE generated  ?  ?Assessment / Plan / Recommendation  ?Clinical Impression ? Pt seen for clinical swallowing evaluation with oral holding, decreased oral manipulation and overall delay in the initiation of the swallow noted with delayed throat  clearing after thin liquids via cup edge.  Pt also exhibited multiple swallows with all consistencies assessed.  Oral cavity/mucosa dry prior to SLP completing oral care before BSE.  Pt with improved vocal quality, although her voice was noted to be hoarse/breathy and with low vocal intensity during assessment.  OME revealed L facial/lingual weakness/decreased sensation and decreased ROM and L asymmetry.  Recommend NPO status until objective assessment completed.  Meds crushed (if permissible) in puree and ice chips/tsp amounts of water allowed until MBS completed.  Thank you for this consult. ?SLP Visit Diagnosis: Dysphagia, oropharyngeal phase (R13.12) ?   ?Aspiration Risk ? Moderate aspiration risk;Risk for inadequate nutrition/hydration  ?  ?Diet Recommendation   NPO ? ?Medication Administration: Crushed with puree  ?  ?Other  Recommendations Oral Care Recommendations: Oral care QID;Oral care prior to ice chip/H20;Staff/trained caregiver to provide oral care   ? ?Recommendations for follow up therapy are one component of a multi-disciplinary discharge planning process, led by the attending physician.  Recommendations may be updated based on patient status, additional functional criteria and insurance authorization. ? ?Follow up Recommendations Follow physician's recommendations for discharge plan and follow up therapies  ? ? ?  ?Assistance Recommended at Discharge  To be determined  ?Functional Status Assessment Patient has had a recent decline in their functional status and demonstrates the ability to make significant improvements in function in a reasonable and predictable amount of time.  ?Frequency and Duration min 2x/week  ?1 week ?  ?   ? ?Prognosis Prognosis for Safe Diet Advancement: Good  ? ?  ? ?  Swallow Study   ?General Date of Onset: 06/24/21 ?HPI: 81 y.o. female with medical history significant of HTN, remote stroke with residual left-sided weakness, anxiety/depression, HLD, sent by daughter for fall  this afternoon.      Last known normal was around 10 AM today.  Daughter found patient on the floor this afternoon around 1 PM.  Patient obviously unable to talk but EMS arrived, EMS interviewed the daughter on scene but found little additional information.  EMS also found the patient's pill bottles not organized and some of her BP meds pill boxes full with expiration date already passed; MRI head on 06/25/21 indicated small acute infarct lateral L thalamus; old occipital infarct; BSE/SLE generated ?Type of Study: Bedside Swallow Evaluation ?Previous Swallow Assessment: [facility] failed 06/24/21 d/t lethargy ?Diet Prior to this Study: NPO ?Temperature Spikes Noted: No ?Respiratory Status: Room air ?History of Recent Intubation: No ?Behavior/Cognition: Alert;Requires cueing;Other (Comment) (overall decreased processing) ?Oral Cavity Assessment: Dry ?Oral Care Completed by SLP: Yes ?Oral Cavity - Dentition: Edentulous ?Self-Feeding Abilities: Needs assist;Needs set up ?Patient Positioning: Upright in bed ?Baseline Vocal Quality: Low vocal intensity;Wet;Hoarse ?Volitional Cough: Weak ?Volitional Swallow: Able to elicit (delayed onset)  ?  ?Oral/Motor/Sensory Function Overall Oral Motor/Sensory Function: Mild impairment ?Facial ROM: Reduced left ?Facial Symmetry: Abnormal symmetry left ?Facial Strength: Reduced left ?Facial Sensation: Reduced left ?Lingual ROM: Reduced left;Reduced right ?Lingual Symmetry: Abnormal symmetry left;Other (Comment) (slight) ?Lingual Strength: Reduced   ?Ice Chips Ice chips: Impaired ?Presentation: Spoon ?Oral Phase Impairments: Reduced lingual movement/coordination ?Oral Phase Functional Implications: Oral holding;Prolonged oral transit ?Pharyngeal Phase Impairments: Suspected delayed Swallow;Multiple swallows   ?Thin Liquid Thin Liquid: Impaired ?Presentation: Straw;Cup;Spoon ?Oral Phase Impairments: Reduced lingual movement/coordination ?Oral Phase Functional Implications: Prolonged oral  transit;Oral holding ?Pharyngeal  Phase Impairments: Suspected delayed Swallow;Multiple swallows;Throat Clearing - Delayed ?Other Comments: unable to suck through straw to consume  ?  ?Nectar Thick Nectar Thick Liquid: Not tested   ?Honey Thick Honey Thick Liquid: Not tested   ?Puree Puree: Impaired ?Presentation: Spoon ?Oral Phase Impairments: Reduced lingual movement/coordination ?Oral Phase Functional Implications: Prolonged oral transit ?Pharyngeal Phase Impairments: Suspected delayed Swallow;Multiple swallows   ?Solid ? ? ?  Solid: Not tested  ? ?  ? ?Elvina Sidle, M.S., CCC-SLP ?06/25/2021,10:43 AM ? ? ? ? ?

## 2021-06-25 NOTE — Progress Notes (Addendum)
STROKE TEAM PROGRESS NOTE  ? ?INTERVAL HISTORY ?Patient is seen in her room with no family at the bedside.  Yesterday, she had a fall at home and was noted to have new onset right sided weakness and left gaze preference.  MRI shows an acute infarct in the left lateral thalamus. ? ?Vitals:  ? 06/25/21 1100 06/25/21 1132 06/25/21 1200 06/25/21 1200  ?BP: (!) 186/87  (!) 182/87   ?Pulse:  78    ?Resp: 16 17 18 16   ?Temp:  98.3 ?F (36.8 ?C)    ?TempSrc:  Oral    ?SpO2:  100%  100%  ?Weight:      ?Height:      ? ?CBC:  ?Recent Labs  ?Lab 06/24/21 ?1503  ?WBC 4.9  ?HGB 11.8*  ?HCT 39.0  ?MCV 85.3  ?PLT 263  ? ?Basic Metabolic Panel:  ?Recent Labs  ?Lab 06/24/21 ?1453  ?NA 138  ?K 4.1  ?CL 105  ?CO2 21*  ?GLUCOSE 81  ?BUN 15  ?CREATININE 1.06*  ?CALCIUM 9.3  ? ?Lipid Panel:  ?Recent Labs  ?Lab 06/25/21 ?0430  ?CHOL 132  ?TRIG 61  ?HDL 50  ?CHOLHDL 2.6  ?VLDL 12  ?[place] 70  ? ?HgbA1c: No results for input(s): HGBA1C in the last 168 hours. ?Urine Drug Screen: No results for input(s): LABOPIA, COCAINSCRNUR, LABBENZ, AMPHETMU, THCU, LABBARB in the last 168 hours.  ?Alcohol Level No results for input(s): ETH in the last 168 hours. ? ?IMAGING past 24 hours ?CT ANGIO HEAD NECK W WO CM ? ?Result Date: 06/24/2021 ?CLINICAL DATA:  Stroke follow-up EXAM: CT ANGIOGRAPHY HEAD AND NECK TECHNIQUE: Multidetector CT imaging of the head and neck was performed using the standard protocol during bolus administration of intravenous contrast. Multiplanar CT image reconstructions and MIPs were obtained to evaluate the vascular anatomy. Carotid stenosis measurements (when applicable) are obtained utilizing NASCET criteria, using the distal internal carotid diameter as the denominator. RADIATION DOSE REDUCTION: This exam was performed according to the departmental dose-optimization program which includes automated exposure control, adjustment of the mA and/or kV according to patient size and/or use of iterative reconstruction technique. CONTRAST:   32mL OMNIPAQUE IOHEXOL 350 MG/ML SOLN COMPARISON:  10/08/2019 MRA head and neck FINDINGS: CTA NECK FINDINGS SKELETON: There is no bony spinal canal stenosis. No lytic or blastic lesion. OTHER NECK: Normal pharynx, larynx and major salivary glands. No cervical lymphadenopathy. Unremarkable thyroid gland. UPPER CHEST: No pneumothorax or pleural effusion. No nodules or masses. AORTIC ARCH: There is calcific atherosclerosis of the aortic arch. There is no aneurysm, dissection or hemodynamically significant stenosis of the visualized portion of the aorta. Conventional 3 vessel aortic branching pattern. The visualized proximal subclavian arteries are widely patent. RIGHT CAROTID SYSTEM: No dissection, occlusion or aneurysm. There is calcified atherosclerosis extending into the proximal ICA, resulting in 50% stenosis. LEFT CAROTID SYSTEM: No dissection, occlusion or aneurysm. Mild atherosclerotic calcification at the carotid bifurcation without hemodynamically significant stenosis. VERTEBRAL ARTERIES: Left dominant configuration. Both origins are clearly patent. There is no dissection, occlusion or flow-limiting stenosis to the skull base (V1-V3 segments). CTA HEAD FINDINGS POSTERIOR CIRCULATION: --Vertebral arteries: Normal V4 segments. --Inferior cerebellar arteries: Normal. --Basilar artery: Normal. --Superior cerebellar arteries: Normal. --Posterior cerebral arteries (PCA): Normal. ANTERIOR CIRCULATION: --Intracranial internal carotid arteries: Atherosclerotic calcification of the internal carotid arteries at the skull base without hemodynamically significant stenosis. --Anterior cerebral arteries (ACA): Superiorly projecting A-comm aneurysm measures 3 x 4 mm. --Middle cerebral arteries (MCA): Severe stenosis of the distal left M1  segment. VENOUS SINUSES: As permitted by contrast timing, patent. ANATOMIC VARIANTS: None Review of the MIP images confirms the above findings. IMPRESSION: 1. No emergent large vessel  occlusion. 2. Severe stenosis of the distal left M1 segment. 3. 3 x 4 mm A-comm aneurysm. 4. 50% stenosis of the proximal right ICA secondary to calcified atherosclerosis. Aortic Atherosclerosis (ICD10-I70.0). Electronically Signed   By: Ulyses Jarred M.D.   On: 06/24/2021 22:25  ? ?DG Chest 2 View ? ?Result Date: 06/24/2021 ?CLINICAL DATA:  Sepsis EXAM: CHEST - 2 VIEW COMPARISON:  02/29/2020 FINDINGS: Frontal and lateral views of the chest demonstrate a stable mildly enlarged cardiac silhouette. No airspace disease, effusion, or pneumothorax. No acute bony abnormalities. IMPRESSION: 1. No acute intrathoracic process. Electronically Signed   By: Randa Ngo M.D.   On: 06/24/2021 16:02  ? ?DG Pelvis 1-2 Views ? ?Result Date: 06/24/2021 ?CLINICAL DATA:  [place], weakness, sepsis EXAM: PELVIS - 1-2 VIEW COMPARISON:  02/29/2020 FINDINGS: Supine frontal view of the pelvis excludes portions of the left greater trochanter by collimation. There are no acute displaced fractures. Stable bilateral hip osteoarthritis. Alignment is anatomic. Sacroiliac joints are normal. IMPRESSION: 1. Stable bilateral hip osteoarthritis. No acute displaced fracture. Electronically Signed   By: Randa Ngo M.D.   On: 06/24/2021 16:03  ? ?CT Head Wo Contrast ? ?Result Date: 06/24/2021 ?CLINICAL DATA:  Neuro deficit, acute, stroke suspected EXAM: CT HEAD WITHOUT CONTRAST TECHNIQUE: Contiguous axial images were obtained from the base of the skull through the vertex without intravenous contrast. RADIATION DOSE REDUCTION: This exam was performed according to the departmental dose-optimization program which includes automated exposure control, adjustment of the mA and/or kV according to patient size and/or use of iterative reconstruction technique. COMPARISON:  January 2022 FINDINGS: Brain: There is no acute intracranial hemorrhage, mass effect, or edema. No new loss of gray-white differentiation. Multiple chronic infarcts are identified including  involvement of right occipital lobe, bilateral central white matter and deep gray nuclei, and right cerebellum. Additional patchy and confluent hypoattenuation in the supratentorial white matter probably reflects stable chronic microvascular ischemic changes. Ventricles and sulci are stable in size and configuration. No extra-axial collection. Vascular: There is atherosclerotic calcification at the skull base. Skull: Calvarium is unremarkable. Sinuses/Orbits: No acute finding. Other: None. IMPRESSION: No acute intracranial abnormality. Stable chronic/nonemergent findings detailed above. Electronically Signed   By: Macy Mis M.D.   On: 06/24/2021 15:23  ? ?CT Cervical Spine Wo Contrast ? ?Result Date: 06/24/2021 ?CLINICAL DATA:  Fall today. EXAM: CT CERVICAL SPINE WITHOUT CONTRAST TECHNIQUE: Multidetector CT imaging of the cervical spine was performed without intravenous contrast. Multiplanar CT image reconstructions were also generated. RADIATION DOSE REDUCTION: This exam was performed according to the departmental dose-optimization program which includes automated exposure control, adjustment of the mA and/or kV according to patient size and/or use of iterative reconstruction technique. COMPARISON:  CT cervical spine 02/29/2020. MRI cervical spine 09/27/2015 FINDINGS: Alignment: Normal Skull base and vertebrae: Negative for fracture Soft tissues and spinal canal: Atherosclerotic calcification in the carotid artery bilaterally. Right thyroid nodule 2.3 cm. Disc levels: Multilevel disc and facet degeneration. Facet degeneration most prominent on the right C2 through C6. Disc degeneration and spurring most prominent at C5-6 with spinal and foraminal stenosis similar to the prior study. Upper chest: Lung apices clear bilaterally. Other: None IMPRESSION: Cervical spondylosis.  Negative for fracture. Electronically Signed   By: Franchot Gallo M.D.   On: 06/24/2021 16:25  ? ?MR BRAIN WO CONTRAST ? ?Result Date:  06/25/2021 ?CLINICAL DATA:  Delirium and right-sided weakness EXAM: MRI HEAD WITHOUT CONTRAST TECHNIQUE: Multiplanar, multiecho pulse sequences of the brain and surrounding structures were obtained without intravenous con

## 2021-06-25 NOTE — Evaluation (Signed)
Occupational Therapy Evaluation ?[name] ?MRN: [id number] ?DOB: [birth year] ?Today's Date: 06/25/2021 ? ? ?History of Present Illness Pt is a 81 y/o female admitted secondary to R weakness and fall. MRI showed L thalamus infarct. PMH includes HTN and CVA.  ? ?Clinical Impression ?  ?Pt admitted for concerns listed above. PTA pt reported that she was independent with all ADL's and IADL's. At this time, pt demonstrates increased weakness, expressive aphasia, balance deficits, and decreased activity tolerance. Overall she is requiring mod A for functional mobility using a RW and min-max A for all BADL's. Recommending AIR to maximize pt's independence and safety. OT will follow acutely.   ?   ? ?Recommendations for follow up therapy are one component of a multi-disciplinary discharge planning process, led by the attending physician.  Recommendations may be updated based on patient status, additional functional criteria and insurance authorization.  ? ?Follow Up Recommendations ? Acute inpatient rehab (3hours/day)  ?  ?Assistance Recommended at Discharge Frequent or constant Supervision/Assistance  ?Patient can return home with the following A lot of help with walking and/or transfers;A lot of help with bathing/dressing/bathroom;Assistance with cooking/housework;Direct supervision/assist for medications management;Assist for transportation;Direct supervision/assist for financial management;Help with stairs or ramp for entrance ? ?  ?Functional Status Assessment ? Patient has had a recent decline in their functional status and demonstrates the ability to make significant improvements in function in a reasonable and predictable amount of time.  ?Equipment Recommendations ? Tub/shower bench;Other (comment) (RW)  ?  ?Recommendations for Other Services Rehab consult ? ? ?  ?Precautions / Restrictions Precautions ?Precautions: Fall ?Restrictions ?Weight Bearing Restrictions: No  ? ?  ? ?Mobility Bed  Mobility ?Overal bed mobility: Needs Assistance ?Bed Mobility: Supine to Sit, Sit to Supine ?  ?  ?Supine to sit: Min assist ?Sit to supine: Min assist ?  ?General bed mobility comments: Assist for LE and trunk management. Increased time required. ?  ? ?Transfers ?Overall transfer level: Needs assistance ?Equipment used: Rolling walker (2 wheels) ?Transfers: Sit to/from Stand ?Sit to Stand: Mod assist ?  ?  ?  ?  ?  ?General transfer comment: Required mod A for lift assist and steadying to stand. Mild posterior lean noted. ?  ? ?  ?Balance Overall balance assessment: Needs assistance ?Sitting-balance support: No upper extremity supported, Feet supported ?Sitting balance-Leahy Scale: Fair ?  ?  ?Standing balance support: Bilateral upper extremity supported ?Standing balance-Leahy Scale: Poor ?Standing balance comment: Reliant on BUE support ?  ?  ?  ?  ?  ?  ?  ?  ?  ?  ?  ?   ? ?ADL either performed or assessed with clinical judgement  ? ?ADL Overall ADL's : Needs assistance/impaired ?Eating/Feeding: NPO ?  ?Grooming: Minimal assistance;Sitting ?  ?Upper Body Bathing: Minimal assistance;Sitting ?  ?Lower Body Bathing: Maximal assistance;Sitting/lateral leans;Sit to/from stand ?  ?Upper Body Dressing : Minimal assistance;Sitting ?  ?Lower Body Dressing: Maximal assistance;Sitting/lateral leans;Sit to/from stand ?  ?Toilet Transfer: Moderate assistance;Ambulation ?  ?Toileting- Clothing Manipulation and Hygiene: Maximal assistance;Sitting/lateral lean;Sit to/from stand ?  ?  ?  ?Functional mobility during ADLs: Moderate assistance;Rolling walker (2 wheels) ?General ADL Comments: Pt limited by weakness, balance deficits, and decreased activity tolerance, requiring increased time and assist for all ADL's.  ? ? ? ?Vision Baseline Vision/History: 1 Wears glasses ?Ability to See in Adequate Light: 0 Adequate ?Patient Visual Report: No change from baseline ?Vision Assessment?: No apparent visual deficits  ?   ?  Perception    ?  ?Praxis   ?  ? ?Pertinent Vitals/Pain Pain Assessment ?Pain Assessment: No/denies pain  ? ? ? ?Hand Dominance Right ?  ?Extremity/Trunk Assessment Upper Extremity Assessment ?Upper Extremity Assessment: Generalized weakness ?  ?Lower Extremity Assessment ?Lower Extremity Assessment: Defer to PT evaluation ?  ?Cervical / Trunk Assessment ?Cervical / Trunk Assessment: Normal ?  ?Communication Communication ?Communication: Expressive difficulties ?  ?Cognition Arousal/Alertness: Awake/alert ?Behavior During Therapy: Flat affect ?Overall Cognitive Status: Difficult to assess ?  ?  ?  ?  ?  ?  ?  ?  ?  ?  ?  ?  ?  ?  ?  ?  ?General Comments: Extremely slow processing. followed simple one step commands with increased time. Difficulty with verbally responding to questions. ?  ?  ?General Comments  VSS on RA, granddaughter present and supportive ? ?  ?Exercises   ?  ?Shoulder Instructions    ? ? ?Home Living Family/patient expects to be discharged to:: Private residence ?Living Arrangements: Children ?Available Help at Discharge: Family;Available PRN/intermittently ?Type of Home: Apartment ?Home Access: Level entry ?  ?  ?Home Layout: One level ?  ?  ?Bathroom Shower/Tub: Tub/shower unit ?  ?Bathroom Toilet: Standard ?  ?  ?Home Equipment: Agricultural consultantolling Walker (2 wheels) ?  ?Additional Comments: info provided by grandaughter ?  ? ?  ?Prior Functioning/Environment Prior Level of Function : Independent/Modified Independent ?  ?  ?  ?  ?  ?  ?Mobility Comments: used no AD ?ADLs Comments: Indep ?  ? ?  ?  ?OT Problem List: Decreased strength;Decreased activity tolerance;Impaired balance (sitting and/or standing);Decreased cognition;Decreased safety awareness;Impaired sensation;Impaired UE functional use ?  ?   ?OT Treatment/Interventions: Self-care/ADL training;Therapeutic exercise;Neuromuscular education;Energy conservation;DME and/or AE instruction;Therapeutic activities;Cognitive remediation/compensation;Patient/family  education;Balance training  ?  ?OT Goals(Current goals can be found in the care plan section) Acute Rehab OT Goals ?Patient Stated Goal: To go home ?OT Goal Formulation: With patient/family ?Time For Goal Achievement: 07/09/21 ?Potential to Achieve Goals: Good ?ADL Goals ?Pt Will Perform Grooming: with modified independence;standing ?Pt Will Perform Lower Body Bathing: with supervision;sitting/lateral leans;sit to/from stand ?Pt Will Perform Lower Body Dressing: with supervision;sitting/lateral leans;sit to/from stand ?Pt Will Transfer to Toilet: with supervision;ambulating ?Pt Will Perform Toileting - Clothing Manipulation and hygiene: with supervision;sitting/lateral leans;sit to/from stand  ?OT Frequency: Min 2X/week ?  ? ?Co-evaluation   ?  ?  ?  ?  ? ?  ?AM-PAC OT "6 Clicks" Daily Activity     ?Outcome Measure Help from another person eating meals?: A Little ?Help from another person taking care of personal grooming?: A Little ?Help from another person toileting, which includes using toliet, bedpan, or urinal?: A Lot ?Help from another person bathing (including washing, rinsing, drying)?: A Lot ?Help from another person to put on and taking off regular upper body clothing?: A Little ?Help from another person to put on and taking off regular lower body clothing?: A Lot ?6 Click Score: 15 ?  ?End of Session Equipment Utilized During Treatment: Gait belt;Rolling walker (2 wheels) ?Nurse Communication: Mobility status ? ?Activity Tolerance: Patient tolerated treatment well ?Patient left: in bed;with call bell/phone within reach;with bed alarm set;with nursing/sitter in room;with family/visitor present ? ?OT Visit Diagnosis: Unsteadiness on feet (R26.81);Other abnormalities of gait and mobility (R26.89);Muscle weakness (generalized) (M62.81)  ?              ?Time: [id number] ?OT Time Calculation (min): 25 min ?Charges:  OT General  Charges ?$OT Visit: 1 Visit ?OT Evaluation ?$OT Eval Moderate Complexity: 1 Mod ?OT  Treatments ?$Self Care/Home Management : 8-22 mins ? ?Aron Inge H., OTR/L ?Acute Rehabilitation ? ?Jekhi Bolin Elane Bing Plume ?06/25/2021, 5:44 PM ?

## 2021-06-25 NOTE — ED Notes (Signed)
Speech at bedside. Pt w/ delayed responses, dysarthria, slow speech.  ?

## 2021-06-25 NOTE — Assessment & Plan Note (Addendum)
MRI brain shows a small L thalamic stroke.   ?See below "Dysphagia" about how overall clinical picture appears worse out of proportion to small extent of stroke. ? ?- Continue aspirin, Brilinta for now ?- Continue Zetia, atorvastatin ?- Continue BP control ? ?Non-invasive angiography showed severe distal left M1 stenosis, 3 x 4 mm ACOM aneurysm, and 50% stenosis of the proximal right ICA.  Echo without cardiogenic source of embolism.  ? ?Atrial fibrillation: she has had a few very brief (1-2 seconds) runs SVT on monitoring -> recommend 1 month cardiac event monitor at D/c ? ?

## 2021-06-25 NOTE — ED Notes (Addendum)
Patient cleaned of incontinence and new brief applied.  Patient's purewick adjusted. Patient self positioned onto left side. ?

## 2021-06-25 NOTE — Evaluation (Signed)
Speech Language Pathology Evaluation ?[name] ?MRN: [id number] ?DOB: [birth year] ?Today's Date: 06/25/2021 ?Time: [id number] ?SLP Time Calculation (min) (ACUTE ONLY): 28 min ? ?Problem List:  ?Patient Active Problem List  ? Diagnosis Date Noted  ? Elevated lactic acid level 06/25/2021  ? Cerebrovascular disease 06/25/2021  ? Anxiety 06/25/2021  ? Encounter for general adult medical examination without abnormal findings 10/17/2020  ? Hardening of the aorta (main artery of the heart) ([place]) 10/17/2020  ? Hemiplegia of nondominant side as late effect of cerebrovascular disease ([place]) 10/17/2020  ? Mild cognitive impairment 10/17/2020  ? Osteopenia 10/17/2020  ? Plantar callosity 10/17/2020  ? Restrictive lung disease 10/17/2020  ? Rhabdomyolysis 03/01/2020  ? Fall 03/01/2020  ? Lab test positive for detection of COVID-19 virus 03/01/2020  ? Hypotension 03/01/2020  ? Elevated troponin 03/01/2020  ? Left leg weakness 03/01/2020  ? Leg edema, right 03/01/2020  ? Acute ischemic stroke ([place]) 10/08/2019  ? Status post left rotator cuff repair 02/13/2018  ? Complete rotator cuff tear of left shoulder 01/27/2018  ? S/P left rotator cuff repair 01/27/2018  ? Mixed incontinence 04/30/2013  ? PVD (peripheral vascular disease) with claudication ([place]) 05/24/2012  ? Peripheral vascular disease, unspecified ([place]) 11/24/2011  ? Neck pain 06/26/2010  ? Bipolar disorder ([place]) 01/09/2010  ? Hyperlipidemia 03/03/2009  ? Impaired fasting glucose 03/03/2009  ? HYPERCHOLESTEROLEMIA 04/21/2006  ? OBESITY, NOS 04/21/2006  ? Major depressive disorder, recurrent episode ([place]) 04/21/2006  ? TOBACCO DEPENDENCE 04/21/2006  ? Hypertensive emergency 04/21/2006  ? HAND PAIN 04/21/2006  ? ?Past Medical History:  ?Past Medical History:  ?Diagnosis Date  ? Arthritis   ? Depression   ? High cholesterol   ? Hypertension   ? Pneumonia   ? Stroke [facility])   ? 1994 no residuals  ? ?Past Surgical History:  ?Past Surgical History:  ?Procedure  Laterality Date  ? ABDOMINAL HYSTERECTOMY    ? catheterization of the left superficial femoral artery  10/20/2009  ? SHOULDER ARTHROSCOPY WITH ROTATOR CUFF REPAIR AND SUBACROMIAL DECOMPRESSION Left 01/27/2018  ? Procedure: LEFT SHOULDER ARTHROSCOPY WITH DEBRIDEMENT, SUBACROMIAL DECOMPRESSION AND ROTATOR CUFF REPAIR;  Surgeon: Mcarthur Rossetti, MD;  Location: [facility] ORS;  Service: Orthopedics;  Laterality: Left;  ? SHOULDER SURGERY    ? Left with dr. Felicie Morn arthroscopy with possible rotator cuff repair  ? TONSILLECTOMY    ? ?HPI:  ?81 y.o. female with medical history significant of HTN, remote stroke with residual left-sided weakness, anxiety/depression, HLD, sent by daughter for fall this afternoon.      Last known normal was around 10 AM today.  Daughter found patient on the floor this afternoon around 1 PM.  Patient obviously unable to talk but EMS arrived, EMS interviewed the daughter on scene but found little additional information.  EMS also found the patient's pill bottles not organized and some of her BP meds pill boxes full with expiration date already passed; MRI brain on 06/25/21 indicated small acute infarct lateral L thalamus; old occipital infarct; BSE/SLE generated  ? ?Assessment / Plan / Recommendation ?Clinical Impression ? Pt assessed for speech/language with overall decreased processing of information noted throughout session with delayed responses to questions and following directives.  Pt oriented x4 with extra processing time allowed for responses.  OME revealed L facial/lingual weakness/asymmetry.  Pt presents with dysarthric speech characterized by imprecise articulation and decreased vocal intensity.  Dysphagia present per BSE with NPO recommendations pending objective assessment.  Cognition difficult to assess d/t pt's  level of alertness and speech/language deficits.  Recommend ST f/u in acute setting for dysarthria/language deficits, dysphagia tx and ongoing cognitive assessment. ?   ?SLP  Assessment ? SLP Recommendation/Assessment: Patient needs continued Speech Language Pathology Services ?SLP Visit Diagnosis: Dysarthria and anarthria (R47.1);Cognitive communication deficit (R41.841)  ?  ?Recommendations for follow up therapy are one component of a multi-disciplinary discharge planning process, led by the attending physician.  Recommendations may be updated based on patient status, additional functional criteria and insurance authorization. ?   ?Follow Up Recommendations ? Follow physician's recommendations for discharge plan and follow up therapies  ?  ?Assistance Recommended at Discharge ? Frequent or constant Supervision/Assistance  ?Functional Status Assessment Patient has had a recent decline in their functional status and demonstrates the ability to make significant improvements in function in a reasonable and predictable amount of time.  ?Frequency and Duration min 2x/week  ?1 week ?  ?   ?SLP Evaluation ?Cognition ? Overall Cognitive Status: Impaired/Different from baseline ?Arousal/Alertness: Awake/alert ?Orientation Level: Oriented X4 ?Year: 2023 ?Month: May ?Comments: Overall decreased processing  ?  ?   ?Comprehension ? Auditory Comprehension ?Overall Auditory Comprehension: Impaired ?Yes/No Questions: Not tested ?Commands: Impaired ?Multistep Basic Commands: 50-74% accurate (overall decreased processing of information) ?Conversation: Simple ?Interfering Components: Processing speed ?EffectiveTechniques: Extra processing time;Repetition ?Visual Recognition/Discrimination ?Discrimination: Not tested ?Reading Comprehension ?Reading Status: Not tested  ?  ?Expression Expression ?Primary Mode of Expression: Verbal ?Verbal Expression ?Overall Verbal Expression: Impaired ?Level of Generative/Spontaneous Verbalization: Phrase ?Repetition: Impaired ?Level of Impairment: Sentence level ?Naming: Impairment ?Responsive: 51-75% accurate ?Confrontation: Impaired ?Convergent: 0-24%  accurate ?Divergent: 0-24% accurate ?Pragmatics: Unable to assess ?Interfering Components: Speech intelligibility ?Non-Verbal Means of Communication: Not applicable ?Written Expression ?Dominant Hand: Right ?Written Expression: Not tested   ?Oral / Motor ? Oral Motor/Sensory Function ?Overall Oral Motor/Sensory Function: Mild impairment ?Facial ROM: Reduced left ?Facial Symmetry: Abnormal symmetry left ?Facial Strength: Reduced left ?Facial Sensation: Reduced left ?Lingual ROM: Reduced left;Reduced right ?Lingual Symmetry: Abnormal symmetry left;Other (Comment) (slight) ?Lingual Strength: Reduced ?Motor Speech ?Overall Motor Speech: Other (comment) (DTA) ?Respiration: Impaired ?Level of Impairment: Phrase ?Phonation: Hoarse;Low vocal intensity ?Resonance: Within functional limits ?Articulation: Impaired ?Level of Impairment: Word ?Intelligibility: Intelligibility reduced ?Word: 50-74% accurate ?Phrase: 25-49% accurate ?Sentence: 25-49% accurate ?Conversation: 25-49% accurate ?Motor Planning: Not tested ?Motor Speech Errors: Not applicable   ?        ? ?Elvina Sidle, M.S., CCC-SLP ?06/25/2021, 10:52 AM ? ? ?

## 2021-06-25 NOTE — Assessment & Plan Note (Signed)
At this point I do not suspect that this elevated lactic acid is due to infection.  UA clear.  Chest x-ray clear. ?

## 2021-06-25 NOTE — Evaluation (Signed)
Physical Therapy Evaluation ?[name] ?MRN: [id number] ?DOB: [birth year] ?Today's Date: 06/25/2021 ? ?History of Present Illness ? Pt is a 80 y/o female admitted secondary to R weakness and fall. MRI showed L thalamus infarct. PMH includes HTN and CVA.  ?Clinical Impression ? Pt admitted secondary to problem above with deficits below. Pt with slow processing and slow to follow commands. Had difficulty verbally responding to questions as well. Requiring min A for bed mobility and mod A to stand and take side steps at EOB. Mild posterior lean noted. Recommending AIR level therapies to increase independence and safety prior to return home. Will continue to follow acutely.    ?   ? ?Recommendations for follow up therapy are one component of a multi-disciplinary discharge planning process, led by the attending physician.  Recommendations may be updated based on patient status, additional functional criteria and insurance authorization. ? ?Follow Up Recommendations Acute inpatient rehab (3hours/day) ? ?  ?Assistance Recommended at Discharge Frequent or constant Supervision/Assistance  ?Patient can return home with the following ? A lot of help with walking and/or transfers;A lot of help with bathing/dressing/bathroom;Assistance with cooking/housework;Direct supervision/assist for medications management;Direct supervision/assist for financial management;Assist for transportation;Help with stairs or ramp for entrance ? ?  ?Equipment Recommendations Other (comment) (TBD pending progression)  ?Recommendations for Other Services ?    ?  ?Functional Status Assessment Patient has had a recent decline in their functional status and demonstrates the ability to make significant improvements in function in a reasonable and predictable amount of time.  ? ?  ?Precautions / Restrictions Precautions ?Precautions: Fall ?Restrictions ?Weight Bearing Restrictions: No  ? ?  ? ?Mobility ? Bed Mobility ?Overal bed  mobility: Needs Assistance ?Bed Mobility: Supine to Sit, Sit to Supine ?  ?  ?Supine to sit: Min assist ?Sit to supine: Min assist ?  ?General bed mobility comments: Assist for LE and trunk management. Increased time required. ?  ? ?Transfers ?Overall transfer level: Needs assistance ?Equipment used: 1 person hand held assist ?Transfers: Sit to/from Stand ?Sit to Stand: Mod assist ?  ?  ?  ?  ?  ?General transfer comment: Required mod A for lift assist and steadying to stand. Had pt hold to PT arms for support. Mild posterior lean noted. ?  ? ?Ambulation/Gait ?Ambulation/Gait assistance: Mod assist ?  ?Assistive device: 1 person hand held assist ?  ?  ?  ?  ?General Gait Details: Took side steps at EOB. Mod A for steadying and required manual assist for sequencing. Further mobility unsafe to perform with +1 assist in ED ? ?Stairs ?  ?  ?  ?  ?  ? ?Wheelchair Mobility ?  ? ?Modified Rankin (Stroke Patients Only) ?  ? ?  ? ?Balance Overall balance assessment: Needs assistance ?Sitting-balance support: No upper extremity supported, Feet supported ?Sitting balance-Leahy Scale: Fair ?  ?  ?Standing balance support: Bilateral upper extremity supported ?Standing balance-Leahy Scale: Poor ?Standing balance comment: Reliant on BUE support ?  ?  ?  ?  ?  ?  ?  ?  ?  ?  ?  ?   ? ? ? ?Pertinent Vitals/Pain Pain Assessment ?Pain Assessment: Faces ?Faces Pain Scale: No hurt  ? ? ?Home Living Family/patient expects to be discharged to:: Private residence ?Living Arrangements: Children ?Available Help at Discharge: Family;Available PRN/intermittently ?Type of Home: Apartment ?Home Access: Level entry ?  ?  ?  ?Home Layout: One level ?Home Equipment: Conservation officer, nature (2  wheels) ?Additional Comments: Unsure of accuracy given cognitive deficits  ?  ?Prior Function Prior Level of Function : Patient poor historian/Family not available ?  ?  ?  ?  ?  ?  ?Mobility Comments: Pt difficult to understand at times and having trouble articulating  PLOF ?  ?  ? ? ?Hand Dominance  ? Dominant Hand: Right ? ?  ?Extremity/Trunk Assessment  ? Upper Extremity Assessment ?Upper Extremity Assessment: Defer to OT evaluation ?  ? ?Lower Extremity Assessment ?Lower Extremity Assessment: Generalized weakness;Difficult to assess due to impaired cognition ?  ? ?Cervical / Trunk Assessment ?Cervical / Trunk Assessment: Normal  ?Communication  ? Communication: Expressive difficulties  ?Cognition Arousal/Alertness: Awake/alert ?Behavior During Therapy: Flat affect ?Overall Cognitive Status: No family/caregiver present to determine baseline cognitive functioning ?  ?  ?  ?  ?  ?  ?  ?  ?  ?  ?  ?  ?  ?  ?  ?  ?General Comments: Extremely slow processing. followed simple one step commands with increased time. Difficulty with verbally responding to questions. ?  ?  ? ?  ?General Comments   ? ?  ?Exercises    ? ?Assessment/Plan  ?  ?PT Assessment Patient needs continued PT services  ?PT Problem List Decreased strength;Decreased activity tolerance;Decreased balance;Decreased mobility;Decreased knowledge of use of DME;Decreased knowledge of precautions;Decreased safety awareness;Decreased cognition ? ?   ?  ?PT Treatment Interventions DME instruction;Gait training;Functional mobility training;Balance training;Therapeutic exercise;Therapeutic activities;Cognitive remediation;Patient/family education   ? ?PT Goals (Current goals can be found in the Care Plan section)  ?Acute Rehab PT Goals ?PT Goal Formulation: Patient unable to participate in goal setting ?Time For Goal Achievement: 07/09/21 ?Potential to Achieve Goals: Good ? ?  ?Frequency Min 4X/week ?  ? ? ?Co-evaluation   ?  ?  ?  ?  ? ? ?  ?AM-PAC PT "6 Clicks" Mobility  ?Outcome Measure Help needed turning from your back to your side while in a flat bed without using bedrails?: A Little ?Help needed moving from lying on your back to sitting on the side of a flat bed without using bedrails?: A Little ?Help needed moving to and  from a bed to a chair (including a wheelchair)?: A Lot ?Help needed standing up from a chair using your arms (e.g., wheelchair or bedside chair)?: A Lot ?Help needed to walk in hospital room?: A Lot ?Help needed climbing 3-5 steps with a railing? : Total ?6 Click Score: 13 ? ?  ?End of Session Equipment Utilized During Treatment: Gait belt ?Activity Tolerance: Patient tolerated treatment well ?Patient left: in bed;with call bell/phone within reach;with bed alarm set (on bed in ED) ?Nurse Communication: Mobility status ?PT Visit Diagnosis: Unsteadiness on feet (R26.81);Muscle weakness (generalized) (M62.81);History of falling (Z91.81);Difficulty in walking, not elsewhere classified (R26.2) ?  ? ?Time: [id number] ?PT Time Calculation (min) (ACUTE ONLY): 16 min ? ? ?Charges:   PT Evaluation ?$PT Eval Moderate Complexity: 1 Mod ?  ?  ?   ? ? ?Reuel Derby, PT, DPT  ?Acute Rehabilitation Services  ?Pager: [phone] ?Office: [phone] ? ? ?[facility] ?06/25/2021, 2:05 PM ? ?

## 2021-06-25 NOTE — ED Notes (Signed)
Patient cleaned of incontinence and repositioned on left side. ?

## 2021-06-25 NOTE — ED Notes (Addendum)
Patient back from MRI, brief placed, purewick adjusted. Patient repositioned on right side. ? ?

## 2021-06-25 NOTE — Hospital Course (Addendum)
[name] is a 81 y.o. F with hx CVA residual left hemiparesis who presented after a fall and subsequent appearance of right hemiparesis and left gaze preference. ? ?In the ER, she had facial droop, decreased responsiveness.  BP 223/165 mmHg and MRI brain showed small L thalamic stroke. ? ?5/3: Admitted and antihypertensives started ?5/5: Failed modified barium, Cortrak placed ?5/8: IR and Palliative care consulted for ?PEG ?

## 2021-06-25 NOTE — Progress Notes (Signed)
Pt. sBP>200.  [place], MD came to bedside and adjusted medications.  Will give PRNs and notify MD if sBP>220 or dBP>120 ?

## 2021-06-25 NOTE — ED Notes (Signed)
Neurology at bedside.

## 2021-06-25 NOTE — Progress Notes (Signed)
Inpatient Rehab Admissions Coordinator Note:  ? ?Per PT recommendations patient was screened for CIR candidacy by Stephania Fragmin, PT. At this time, pt appears to be a potential candidate for CIR. I will place an order for rehab consult for full assessment, per our protocol.  Please contact me any with questions.. ? ?Estill Dooms, PT, DPT ?[phone] ?06/25/21 ?5:19 PM  ?

## 2021-06-25 NOTE — Assessment & Plan Note (Addendum)
See above

## 2021-06-26 ENCOUNTER — Inpatient Hospital Stay (HOSPITAL_COMMUNITY): Payer: Medicare HMO

## 2021-06-26 ENCOUNTER — Ambulatory Visit: Payer: Medicare HMO | Admitting: Podiatry

## 2021-06-26 DIAGNOSIS — I639 Cerebral infarction, unspecified: Secondary | ICD-10-CM | POA: Diagnosis not present

## 2021-06-26 DIAGNOSIS — E440 Moderate protein-calorie malnutrition: Secondary | ICD-10-CM | POA: Insufficient documentation

## 2021-06-26 LAB — BASIC METABOLIC PANEL
Anion gap: 14 (ref 5–15)
BUN: 13 mg/dL (ref 8–23)
CO2: 18 mmol/L — ABNORMAL LOW (ref 22–32)
Calcium: 9.5 mg/dL (ref 8.9–10.3)
Chloride: 105 mmol/L (ref 98–111)
Creatinine, Ser: 1.06 mg/dL — ABNORMAL HIGH (ref 0.44–1.00)
GFR, Estimated: 54 mL/min — ABNORMAL LOW (ref >60)
Glucose, Bld: 80 mg/dL (ref 70–99)
Potassium: 4.1 mmol/L (ref 3.5–5.1)
Sodium: 137 mmol/L (ref 135–145)

## 2021-06-26 LAB — HEMOGLOBIN A1C
Hgb A1c MFr Bld: 5.3 % (ref 4.8–5.6)
Mean Plasma Glucose: 105.41 mg/dL

## 2021-06-26 LAB — MAGNESIUM: Magnesium: 2.1 mg/dL (ref 1.7–2.4)

## 2021-06-26 LAB — PHOSPHORUS: Phosphorus: 4.3 mg/dL (ref 2.5–4.6)

## 2021-06-26 MED ORDER — HYDRALAZINE HCL 50 MG PO TABS
50.0000 mg | ORAL_TABLET | Freq: Three times a day (TID) | ORAL | Status: DC
  Administered 2021-06-26 – 2021-06-27 (×2): 50 mg via ORAL
  Filled 2021-06-26 (×2): qty 1

## 2021-06-26 MED ORDER — HYDRALAZINE HCL 25 MG PO TABS
25.0000 mg | ORAL_TABLET | Freq: Three times a day (TID) | ORAL | Status: DC
  Administered 2021-06-26: 25 mg via ORAL
  Filled 2021-06-26: qty 1

## 2021-06-26 MED ORDER — FREE WATER
130.0000 mL | Status: DC
  Administered 2021-06-26 – 2021-06-30 (×26): 130 mL

## 2021-06-26 MED ORDER — AMLODIPINE BESYLATE 5 MG PO TABS
5.0000 mg | ORAL_TABLET | Freq: Every day | ORAL | Status: DC
  Administered 2021-06-26: 5 mg via ORAL
  Filled 2021-06-26: qty 1

## 2021-06-26 MED ORDER — AMLODIPINE BESYLATE 10 MG PO TABS: 10.0000 mg | ORAL_TABLET | Freq: Every day | ORAL | Status: DC

## 2021-06-26 MED ORDER — PROSOURCE TF PO LIQD
45.0000 mL | Freq: Two times a day (BID) | ORAL | Status: DC
  Administered 2021-06-26 – 2021-07-08 (×23): 45 mL
  Filled 2021-06-26 (×23): qty 45

## 2021-06-26 MED ORDER — OSMOLITE 1.5 CAL PO LIQD
1000.0000 mL | ORAL | Status: DC
  Administered 2021-06-26 – 2021-07-05 (×9): 1000 mL
  Filled 2021-06-26 (×2): qty 1000

## 2021-06-26 NOTE — Progress Notes (Signed)
Initial Nutrition Assessment ? ?DOCUMENTATION CODES:  ? ?Non-severe (moderate) malnutrition in context of chronic illness ? ?INTERVENTION:  ?Initiate tube feeding via Cortrak: ?Start Osmolite 1.5 at 25 ml and advance by 68ml every 6 hours until goal rate of 50 ml/h (1200 ml per day) ?Prosource TF 45 ml BID ? ?Provides 1880 kcal, 97 gm protein, 914 ml free water daily ? ?Free water flushes of 160ml Q4H (free water + flushes= 1640ml per day) ? ?NUTRITION DIAGNOSIS:  ? ?Moderate Malnutrition related to chronic illness (h/o stroke) as evidenced by mild fat depletion, moderate muscle depletion, mild muscle depletion. ? ?GOAL:  ? ?Patient will meet greater than or equal to 90% of their needs ? ?MONITOR:  ? ?PO intake, Supplement acceptance, Diet advancement, Labs, Weight trends, TF tolerance ? ?REASON FOR ASSESSMENT:  ? ?Consult ?Enteral/tube feeding initiation and management ? ?ASSESSMENT:  ? ?Pt admitted after a fall likely secondary to acute ischemic stroke. PMH significant for HTN, remote stroke with residual L-sided weakness, anxiety/depression and HLD. ? ?Pt being followed by SLP. S/p MBS today, noted to have severe oral, moderate pharyngeal phase dysphagia- recommended to continue with NPO status and placement of Cortrak.  ? ?Pt sitting in bed unable to provide history d/t residual effects of stroke. No family present at bedside. Noted Cortrak in place. Cortrak placement confirmed in distal stomach. Spoke with MD, ok to start tube feeding.  ? ?Per review of chart, limited weight history on file to assess for weight changes. Current weight noted to be 72.6 kg.  ? ?Pt noted to have mild-moderate muscle and fat depletions. Pt with h/o stroke. Per review of chart, pt with dysphagia d/t prior stroke which could likely be contributing factor to observed depletions and resulting malnutrition.  ? ?Medications reviewed ? ?Labs: Cr 1.06, GFR 54, HgbA1c 5.3% ? ?NUTRITION - FOCUSED PHYSICAL EXAM: ? ?Flowsheet Row Most Recent  Value  ?Orbital Region No depletion  ?Upper Arm Region Mild depletion  ?Thoracic and Lumbar Region Mild depletion  ?Buccal Region Mild depletion  ?Temple Region No depletion  ?Clavicle Bone Region Mild depletion  ?Clavicle and Acromion Bone Region Mild depletion  ?Scapular Bone Region No depletion  ?Dorsal Hand No depletion  ?Patellar Region Moderate depletion  ?Anterior Thigh Region Moderate depletion  ?Posterior Calf Region Moderate depletion  ?Edema (RD Assessment) None  ?Hair Reviewed  ?Eyes Reviewed  ?Mouth Reviewed  ?Skin Reviewed  ?Nails Reviewed  ? ?  ? ? ?Diet Order:   ?Diet Order   ? ?       ?  Diet NPO time specified  Diet effective now       ?  ? ?  ?  ? ?  ? ? ?EDUCATION NEEDS:  ? ?No education needs have been identified at this time ? ?Skin:  Skin Assessment: Reviewed RN Assessment ? ?Last BM:  PTA ? ?Height:  ? ?Ht Readings from Last 1 Encounters:  ?06/24/21 5\' 3"  (1.6 m)  ? ? ?Weight:  ? ?Wt Readings from Last 1 Encounters:  ?06/24/21 72.6 kg  ? ?BMI:  Body mass index is 28.34 kg/m?. ? ?Estimated Nutritional Needs:  ? ?Kcal:  1700-1900 ? ?Protein:  90-105g ? ?Fluid:  >/=1.7L ? ?Clayborne Dana, RDN, LDN ?Clinical Nutrition ?

## 2021-06-26 NOTE — Progress Notes (Signed)
Physical Therapy Treatment ?[name] ?MRN: [id number] ?DOB: 1940/05/14 ?Today's Date: 06/26/2021 ? ? ?History of Present Illness Pt is a 81 y/o female admitted secondary to R weakness and fall. MRI showed L thalamus infarct. PMH includes HTN and CVA. ? ?  ?PT Comments  ? ? Pt required min assist bed mobility, mod assist sit to stand, and min assist ambulation 25' with RW. Pt presents with slow processing requiring increased time to respond to cues and complete tasks. She followed simple commands consistently, including directional cues/instructions during ambulation. No verbalizations during session. Pt in bed at end of session in preparation for MBS. ?   ?Recommendations for follow up therapy are one component of a multi-disciplinary discharge planning process, led by the attending physician.  Recommendations may be updated based on patient status, additional functional criteria and insurance authorization. ? ?Follow Up Recommendations ? Acute inpatient rehab (3hours/day) ?  ?  ?Assistance Recommended at Discharge Frequent or constant Supervision/Assistance  ?Patient can return home with the following A lot of help with walking and/or transfers;A lot of help with bathing/dressing/bathroom;Assistance with cooking/housework;Direct supervision/assist for medications management;Direct supervision/assist for financial management;Assist for transportation;Help with stairs or ramp for entrance ?  ?Equipment Recommendations ? Other (comment) (TBD pending progress)  ?  ?Recommendations for Other Services Rehab consult ? ? ?  ?Precautions / Restrictions Precautions ?Precautions: Fall  ?  ? ?Mobility ? Bed Mobility ?Overal bed mobility: Needs Assistance ?Bed Mobility: Supine to Sit, Sit to Supine ?  ?  ?Supine to sit: Min assist, HOB elevated ?Sit to supine: Min assist ?  ?General bed mobility comments: increased time ?  ? ?Transfers ?Overall transfer level: Needs assistance ?Equipment used: Rolling  walker (2 wheels) ?Transfers: Sit to/from Stand ?Sit to Stand: Mod assist ?  ?  ?  ?  ?  ?General transfer comment: sit to stand x 3 trials from EOB ?  ? ?Ambulation/Gait ?Ambulation/Gait assistance: Min assist ?Gait Distance (Feet): 25 Feet ?Assistive device: Rolling walker (2 wheels) ?Gait Pattern/deviations: Step-through pattern, Decreased stride length, Trunk flexed ?Gait velocity: decreased ?Gait velocity interpretation: <1.8 ft/sec, indicate of risk for recurrent falls ?  ?General Gait Details: slow, guarded gait with RW. Following directional cues/instructions with increased time. Overall flexed posture. ? ? ?Stairs ?  ?  ?  ?  ?  ? ? ?Wheelchair Mobility ?  ? ?Modified Rankin (Stroke Patients Only) ?Modified Rankin (Stroke Patients Only) ?Pre-Morbid Rankin Score: No significant disability ?Modified Rankin: Moderately severe disability ? ? ?  ?Balance Overall balance assessment: Needs assistance ?Sitting-balance support: No upper extremity supported, Feet supported ?Sitting balance-Leahy Scale: Fair ?  ?  ?Standing balance support: Bilateral upper extremity supported, During functional activity, Reliant on assistive device for balance ?Standing balance-Leahy Scale: Poor ?  ?  ?  ?  ?  ?  ?  ?  ?  ?  ?  ?  ?  ? ?  ?Cognition Arousal/Alertness: Awake/alert ?Behavior During Therapy: Flat affect ?Overall Cognitive Status: Difficult to assess ?  ?  ?  ?  ?  ?  ?  ?  ?  ?  ?  ?  ?  ?  ?  ?  ?General Comments: Extremely slow processing. Following simple commands with increased time. No verbalizations during session. ?  ?  ? ?  ?Exercises   ? ?  ?General Comments   ?  ?  ? ?Pertinent Vitals/Pain Pain Assessment ?Pain Assessment: Faces ?Faces Pain Scale: No hurt  ? ? ?  Home Living   ?  ?  ?  ?  ?  ?  ?  ?  ?  ?   ?  ?Prior Function    ?  ?  ?   ? ?PT Goals (current goals can now be found in the care plan section) Progress towards PT goals: Progressing toward goals ? ?  ?Frequency ? ? ? Min 4X/week ? ? ? ?  ?PT Plan  Current plan remains appropriate  ? ? ?Co-evaluation   ?  ?  ?  ?  ? ?  ?AM-PAC PT "6 Clicks" Mobility   ?Outcome Measure ? Help needed turning from your back to your side while in a flat bed without using bedrails?: A Little ?Help needed moving from lying on your back to sitting on the side of a flat bed without using bedrails?: A Little ?Help needed moving to and from a bed to a chair (including a wheelchair)?: A Lot ?Help needed standing up from a chair using your arms (e.g., wheelchair or bedside chair)?: A Lot ?Help needed to walk in hospital room?: A Little ?Help needed climbing 3-5 steps with a railing? : Total ?6 Click Score: 14 ? ?  ?End of Session Equipment Utilized During Treatment: Gait belt ?Activity Tolerance: Patient tolerated treatment well ?Patient left: in bed;with call bell/phone within reach;with bed alarm set ?Nurse Communication: Mobility status ?PT Visit Diagnosis: Unsteadiness on feet (R26.81);Muscle weakness (generalized) (M62.81);History of falling (Z91.81);Difficulty in walking, not elsewhere classified (R26.2) ?  ? ? ?Time: [id number] ?PT Time Calculation (min) (ACUTE ONLY): 24 min ? ?Charges:  $Gait Training: 8-22 mins ?$Therapeutic Activity: 8-22 mins          ?          ? ?Lorrin Goodell, PT  ?Office # [phone] ?Pager [phone] ? ? ? ?Lorriane Shire ?06/26/2021, 11:53 AM ? ?

## 2021-06-26 NOTE — Progress Notes (Signed)
?  Progress Note ? ? ?Patient: [name] [id number] DOB: 12/17/1940 DOA: 06/24/2021     2 ?DOS: the patient was seen and examined on 06/26/2021 at 9:29AM ?  ? ? ? ?Brief hospital course: ?Mrs. Pelchat is a 81 y.o. F with hx CVA residual left hemiparesis who presented after a fall and subsequent appearance of right hemiparesis and left gaze preference. ? ?In the ER, she had facial droop, decreased responsiveness.  BP 223/165 mmHg and MRI brain showed small L thalamic stroke. ? ? ? ? ? ? ?Assessment and Plan: ?* Acute ischemic stroke ([place]) ?MRI brain shows a small L thalamic stroke ? ?Non-invasive angiography showed severe distal left M1 stenosis, 3 x 4 mm ACOM aneurysm, and 50% stenosis of the proximal right ICA  ? ?Echo without cardiogenic source. ? ?Lipids ordered wtih LDL 70, continue atorvastatin, Zetia ? ?- Continue Aspirin, Brilinta ?- Atrial fibrillation: not present on tele ? ? ?Dysphagia ?Due to stroke ?Failed MBS today. ?- Place Cortrak today and start tube feeds ? ? ?Hypertensive emergency ?Patient presented with severe range hypertension >220 with evidence of stroke and encephalopathy. ? ?BP reduced by 25% in the last 24 hours, but unable to take pills due to stroke and actually BP poorly responsive to multiple antihypertensives ? ?Today, able to take pills now via Cortrak ?- Resume  amlodipine, hydralazine, HCTZ ?- Hold  lisinopril, metoprolol for now ?- Continue PRNs for SBP > 180 ? ?Anxiety ?- Continue SSRI, bupropion ? ?Cerebrovascular disease ?- Continue aspirin, Brilinta, atorvastatin, Zetia ?  ?  ? ? ? ? ? ? ? ? ?Subjective: Patient still feels weak and tired, she is not able to swallow, failed MBS today, she has no new weakness, no fever, no cough, no confusion ? ? ? ? ?Physical Exam: ?Vitals:  ? 06/25/21 2319 06/26/21 [id number] 06/26/21 0435 06/26/21 0755  ?BP: (!) 175/92 (!) 218/109 (!) 187/89 (!) 168/92  ?Pulse: 68 71 69 63  ?Resp: 17  18 20   ?Temp:  98.3 ?F (36.8 ?C) 98.3 ?F (36.8 ?C) 97.8 ?F (36.6  ?C)  ?TempSrc: Oral Oral Oral Oral  ?SpO2: 100% 100% 99% 98%  ?Weight:      ?Height:      ? ?Elderly adult female, lying in bed, weak and tired ?Voice is hypophonic, speech is dysarthric, she has some generalized weakness, but symmetric strength, face symmetric. ?RRR, no murmurs, no peripheral edema ?Respiratory effort normal, lungs clear without rales or wheezes ?Abdomen soft without tenderness palpation or ? ?Data Reviewed: ?Discussed with neurology, nursing notes reviewed, vital signs reviewed ?Patient metabolic panel with no change, creatinine stable ?Echocardiogram grade 1 diastolic dysfunction ?LDL 70 ?A1c 5.3 ? ?Family Communication: daughter at Bedside ? ? ? ?Disposition: ?Status is: Inpatient ? ? ? ? ? ? ? ? ?Author: ?Edwin Dada, MD ?06/26/2021 5:18 PM ? ?For on call review [email or web address].  ? ? ?

## 2021-06-26 NOTE — Progress Notes (Signed)
Patient is from home with family. Current recommendations are for CIR. TOC following. ?

## 2021-06-26 NOTE — Progress Notes (Signed)
Speech Language Pathology Treatment: Dysphagia  ?[name] ?MRN: [id number] ?DOB: March 29, 1940 ?Today's Date: 06/26/2021 ?Time: [id number] ?SLP Time Calculation (min) (ACUTE ONLY): 25 min ? ?Assessment / Plan / Recommendation ?Clinical Impression ? Pt seen for swallowing treatment in preparation for today's MBS at noon.  She demonstrates significant delays in response time, poor initiation with regard to swallowing and communication. She participated in oral care, using toothbrush/suctioning with hand-over-hand assist.  She accepted ice chips, sips of water, tspns of applesauce with max cues needed to seal lips and initiate a swallow. There was initial coughing - this subsided as session progressed.  There was poor awareness of material orally with spillage and oral residue.  ? ?Will move forward with plan for MBS today - it is scheduled for noon.  Pt may need cortrak regardless of performance today.  Poor initiation and task persistence may be primary obstacle to adequate oral intake.   ? ?During the interim, allow meds crushed with applesauce.   ? ?  ?HPI HPI: 81 y.o. female with medical history significant of HTN, remote stroke with residual left-sided weakness, anxiety/depression, HLD, sent by daughter for fall this afternoon.      Last known normal was around 10 AM today.  Daughter found patient on the floor this afternoon around 1 PM.  Patient obviously unable to talk but EMS arrived, EMS interviewed the daughter on scene but found little additional information.  EMS also found the patient's pill bottles not organized and some of her BP meds pill boxes full with expiration date already passed; MRI head on 06/25/21 indicated small acute infarct lateral L thalamus; old occipital infarct; BSE/SLE generated ?  ?   ?SLP Plan ? MBS;Continue with current plan of care ? ?  ?  ?Recommendations for follow up therapy are one component of a multi-disciplinary discharge planning process, led by the  attending physician.  Recommendations may be updated based on patient status, additional functional criteria and insurance authorization. ?  ? ?Recommendations  ?Medication Administration: Crushed with puree  ?   ?    ?   ? ? ? ? Oral Care Recommendations: Oral care QID;Oral care prior to ice chip/H20;Staff/trained caregiver to provide oral care ?Follow Up Recommendations: Acute inpatient rehab (3hours/day) ?Assistance recommended at discharge: Frequent or constant Supervision/Assistance ?SLP Visit Diagnosis: Dysphagia, oropharyngeal phase (R13.12) ?Plan: MBS;Continue with current plan of care ? ? ? ? ?  ?  ?[name] L. Barbara Henneman, MA CCC/SLP ?Acute Rehabilitation Services ?Office number [phone] ?Pager [phone] ? ? ?[name] ? ?06/26/2021, 10:23 AM ?

## 2021-06-26 NOTE — Procedures (Signed)
Cortrak  Person Inserting Tube:  Branna Cortina D, RD Tube Type:  Cortrak - 43 inches Tube Size:  10 Tube Location:  Left nare Secured by: Bridle Technique Used to Measure Tube Placement:  Marking at nare/corner of mouth Cortrak Secured At:  70 cm  Cortrak Tube Team Note:  Consult received to place a Cortrak feeding tube.   X-ray is required, abdominal x-ray has been ordered by the Cortrak team. Please confirm tube placement before using the Cortrak tube.   If the tube becomes dislodged please keep the tube and contact the Cortrak team at [email or web address] (password [email or web address]) for replacement.  If after hours and replacement cannot be delayed, place a NG tube and confirm placement with an abdominal x-ray.    Sydny Schnitzler, RD, LDN Clinical Dietitian RD pager # available in [employer]  After hours/weekend pager # available in [employer]   

## 2021-06-26 NOTE — Progress Notes (Signed)
STROKE TEAM PROGRESS NOTE  ? ?INTERVAL HISTORY ?Patient lying in bed, no family at bedside.  Patient still has severe dysarthria.  Did not pass swallow, put on core track for tube feeding. ? ?Vitals:  ? 06/26/21 0416 06/26/21 0435 06/26/21 0755 06/26/21 1730  ?BP: (!) 218/109 (!) 187/89 (!) 168/92 (!) 201/94  ?Pulse: 71 69 63 73  ?Resp:  18 20 20   ?Temp: 98.3 ?F (36.8 ?C) 98.3 ?F (36.8 ?C) 97.8 ?F (36.6 ?C) 98.8 ?F (37.1 ?C)  ?TempSrc: Oral Oral Oral Axillary  ?SpO2: 100% 99% 98% 100%  ?Weight:      ?Height:      ? ?CBC:  ?Recent Labs  ?Lab 06/24/21 ?1503  ?WBC 4.9  ?HGB 11.8*  ?HCT 39.0  ?MCV 85.3  ?PLT 263  ? ?Basic Metabolic Panel:  ?Recent Labs  ?Lab 06/24/21 ?1453 06/26/21 ?0342 06/26/21 ?1854  ?NA 138 137  --   ?K 4.1 4.1  --   ?CL 105 105  --   ?CO2 21* 18*  --   ?GLUCOSE 81 80  --   ?BUN 15 13  --   ?CREATININE 1.06* 1.06*  --   ?CALCIUM 9.3 9.5  --   ?MG  --   --  2.1  ?PHOS  --   --  4.3  ? ?Lipid Panel:  ?Recent Labs  ?Lab 06/25/21 ?0430  ?CHOL 132  ?TRIG 61  ?HDL 50  ?CHOLHDL 2.6  ?VLDL 12  ?Colver 70  ? ?HgbA1c:  ?Recent Labs  ?Lab 06/26/21 ?0342  ?HGBA1C 5.3  ? ?Urine Drug Screen: No results for input(s): LABOPIA, COCAINSCRNUR, LABBENZ, AMPHETMU, THCU, LABBARB in the last 168 hours.  ?Alcohol Level No results for input(s): ETH in the last 168 hours. ? ?IMAGING past 24 hours ?DG Abd Portable 1V ? ?Result Date: 06/26/2021 ?CLINICAL DATA:  Feeding tube EXAM: PORTABLE ABDOMEN - 1 VIEW COMPARISON:  03/01/2020 FINDINGS: Enteric tube tip overlies the distal stomach. Gas pattern is unobstructed with mild diffuse increased bowel gas. Moderate stool in the colon. Ovoid left mid abdominal calcifications are without change IMPRESSION: Enteric tube tip overlies the distal stomach. Electronically Signed   By: Donavan Foil M.D.   On: 06/26/2021 15:16  ? ?DG Swallowing Func-Speech Pathology ? ?Result Date: 06/26/2021 ?Table formatting from the original result was not included. Images from the original result were not  included. Objective Swallowing Evaluation: Type of Study: MBS-Modified Barium Swallow Study  Patient Details Name: [name] MRN: [id number] Date of Birth: 05-13-1940 Today's Date: 06/26/2021 Time: SLP Start Time (ACUTE ONLY): [id number] -SLP Stop Time (ACUTE ONLY): [id number] SLP Time Calculation (min) (ACUTE ONLY): 20 min Past Medical History: Past Medical History: Diagnosis Date  Arthritis   Depression   High cholesterol   Hypertension   Pneumonia   Stroke ([place])   1994 no residuals Past Surgical History: Past Surgical History: Procedure Laterality Date  ABDOMINAL HYSTERECTOMY    catheterization of the left superficial femoral artery  10/20/2009  SHOULDER ARTHROSCOPY WITH ROTATOR CUFF REPAIR AND SUBACROMIAL DECOMPRESSION Left 01/27/2018  Procedure: LEFT SHOULDER ARTHROSCOPY WITH DEBRIDEMENT, SUBACROMIAL DECOMPRESSION AND ROTATOR CUFF REPAIR;  Surgeon: Mcarthur Rossetti, MD;  Location: [facility];  Service: Orthopedics;  Laterality: Left;  SHOULDER SURGERY    Left with dr. Felicie Morn arthroscopy with possible rotator cuff repair  TONSILLECTOMY   HPI: 81 y.o. female with medical history significant of HTN, remote stroke with residual left-sided weakness, anxiety/depression, HLD, sent by daughter for fall this afternoon.  Last known normal was around 10 AM today.  Daughter found patient on the floor this afternoon around 1 PM.  Patient obviously unable to talk but EMS arrived, EMS interviewed the daughter on scene but found little additional information.  EMS also found the patient's pill bottles not organized and some of her BP meds pill boxes full with expiration date already passed; MRI head on 06/25/21 indicated small acute infarct lateral L thalamus; old occipital infarct.  10/09/19 MBS while inpatient revealed significant oral dysphagia.  Subjective: Pt able to arouse and participate for BSE;overall delayed processing/dysarthric  Recommendations for follow up therapy are one component of a multi-disciplinary discharge  planning process, led by the attending physician.  Recommendations may be updated based on patient status, additional functional criteria and insurance authorization. Assessment / Plan / Recommendation   06/26/2021  12:00 PM Clinical Impressions Clinical Impression Pt presents with a severe oral, moderate pharyngeal phase dysphagia marked by significant sensori-motor deficits or the oral phase.  She has difficulty organizing a bolus, leading to spillage throughout the oral cavity, anteriorly out of the mouth and posteriorly over the base of tongue.  With thin and nectar thick liquids, materials spilled quickly into the airway before laryngeal closure. Coughing led to ejection of part of aspirate and immediate re-aspiration of same material.  Purees were swallowed with no spillage into larynx, but they required significant effort on pt's part to move them into pharynx from oral cavity.  At the end of the study, puree residue had to be manually cleaned from mouth.  Of note, pt's swallow function was similar after stroke in 2021.  Recommend consideration of NPO status and cortrak for now.  SLP will follow for therapeutic feeds of purees and swallowing therapy. RN may give meds crushed in puree. Please provide vigilant oral care. SLP Visit Diagnosis Dysphagia, oropharyngeal phase (R13.12) Impact on safety and function Severe aspiration risk     06/26/2021  12:00 PM Treatment Recommendations Treatment Recommendations Therapy as outlined in treatment plan below     06/26/2021  12:00 PM Prognosis Prognosis for Safe Diet Advancement Good   06/26/2021  12:00 PM Diet Recommendations SLP Diet Recommendations NPO except meds Medication Administration Crushed with puree     06/26/2021  12:00 PM Other Recommendations Oral Care Recommendations Oral care QID Follow Up Recommendations Acute inpatient rehab (3hours/day) Assistance recommended at discharge Frequent or constant Supervision/Assistance Functional Status Assessment Patient has had  a recent decline in their functional status and demonstrates the ability to make significant improvements in function in a reasonable and predictable amount of time.   06/26/2021  12:00 PM Frequency and Duration  Speech Therapy Frequency (ACUTE ONLY) min 2x/week Treatment Duration 2 weeks     06/26/2021  12:00 PM Oral Phase Oral Phase Impaired Oral - Nectar Teaspoon Left anterior bolus loss;Right anterior bolus loss;Weak lingual manipulation;Incomplete tongue to palate contact;Reduced posterior propulsion;Holding of bolus;Pocketing in anterior sulcus;Delayed oral transit;Decreased bolus cohesion;Premature spillage Oral - Thin Cup Left anterior bolus loss;Right anterior bolus loss;Weak lingual manipulation;Incomplete tongue to palate contact;Reduced posterior propulsion;Holding of bolus;Pocketing in anterior sulcus;Delayed oral transit;Decreased bolus cohesion;Premature spillage Oral - Puree Left anterior bolus loss;Right anterior bolus loss;Weak lingual manipulation;Incomplete tongue to palate contact;Reduced posterior propulsion;Holding of bolus;Pocketing in anterior sulcus;Delayed oral transit;Decreased bolus cohesion;Premature spillage    06/26/2021  12:00 PM Pharyngeal Phase Pharyngeal Phase Impaired Pharyngeal- Nectar Teaspoon Delayed swallow initiation-pyriform sinuses;Reduced pharyngeal peristalsis;Reduced epiglottic inversion;Reduced airway/laryngeal closure;Reduced tongue base retraction;Penetration/Aspiration before swallow;Trace aspiration Pharyngeal Material enters airway, passes BELOW  cords and not ejected out despite cough attempt by patient Pharyngeal- Thin Cup Delayed swallow initiation-pyriform sinuses;Reduced pharyngeal peristalsis;Reduced epiglottic inversion;Reduced airway/laryngeal closure;Reduced tongue base retraction;Penetration/Aspiration before swallow;Moderate aspiration Pharyngeal Material enters airway, passes BELOW cords and not ejected out despite cough attempt by patient Pharyngeal- Puree  Delayed swallow initiation-pyriform sinuses;Delayed swallow initiation-vallecula;Reduced epiglottic inversion;Pharyngeal residue - valleculae;Pharyngeal residue - pyriform     View : No data to display.    Cou

## 2021-06-26 NOTE — Progress Notes (Signed)
Modified Barium Swallow Progress Note ? ?Patient Details  ?Name: [name] ?MRN: [id number] ?Date of Birth: Apr 24, 1940 ? ?Today's Date: 06/26/2021 ? ?Modified Barium Swallow completed.  Full report located under Chart Review in the Imaging Section. ? ?Brief recommendations include the following: ? ?Clinical Impression ? Pt presents with a severe oral, moderate pharyngeal phase dysphagia marked by significant sensori-motor deficits or the oral phase.  She has difficulty organizing a bolus, leading to spillage throughout the oral cavity, anteriorly out of the mouth and posteriorly over the base of tongue.  With thin and nectar thick liquids, materials spilled quickly into the airway before laryngeal closure. Coughing led to ejection of part of aspirate and immediate re-aspiration of same material.  Purees were swallowed with no spillage into larynx, but they required significant effort on pt's part to move them into pharynx from oral cavity.  At the end of the study, puree residue had to be manually cleaned from mouth.  Of note, pt's swallow function was similar after stroke in 2021.  Recommend consideration of NPO status and cortrak for now.  SLP will follow for therapeutic feeds of purees and swallowing therapy. RN may give meds crushed in puree. Please provide vigilant oral care. ?  ?Swallow Evaluation Recommendations ? ?   ? ? SLP Diet Recommendations: NPO except meds ? ?   ? ? Medication Administration: Crushed with puree ? ?   ? ?   ? ?   ? ? Oral Care Recommendations: Oral care QID ? ?   ? ?Barbara Even L. Marco Adelson, MA CCC/SLP ?Acute Rehabilitation Services ?Office number [phone] ?Pager [phone] ? ? ?[name] ?06/26/2021,1:25 PM ?

## 2021-06-26 NOTE — Progress Notes (Signed)
Inpatient Rehabilitation Admissions Coordinator  ? ?I was unable to reach daughter, [name],  at number provided; person stated wrong number. I have left voice mails for patient's sister, [name], to call me to discuss rehab venue options. ? ?Ottie Glazier, RN, MSN ?Rehab Admissions Coordinator ?[phone] ?06/26/2021 11:13 AM ? ?

## 2021-06-26 NOTE — Care Management Important Message (Signed)
Important Message ? ?Patient Details  ?Name: [name] ?MRN: [id number] ?Date of Birth: 1940/02/28 ? ? ?Medicare Important Message Given:  Yes ? ? ? ? ?Raymondo Garcialopez ?06/26/2021, 4:07 PM ?

## 2021-06-27 DIAGNOSIS — I639 Cerebral infarction, unspecified: Secondary | ICD-10-CM | POA: Diagnosis not present

## 2021-06-27 DIAGNOSIS — R531 Weakness: Secondary | ICD-10-CM | POA: Diagnosis not present

## 2021-06-27 DIAGNOSIS — I679 Cerebrovascular disease, unspecified: Secondary | ICD-10-CM | POA: Diagnosis not present

## 2021-06-27 DIAGNOSIS — W19XXXA Unspecified fall, initial encounter: Secondary | ICD-10-CM | POA: Diagnosis not present

## 2021-06-27 LAB — MAGNESIUM: Magnesium: 2.1 mg/dL (ref 1.7–2.4)

## 2021-06-27 LAB — GLUCOSE, CAPILLARY
Glucose-Capillary: 128 mg/dL — ABNORMAL HIGH (ref 70–99)
Glucose-Capillary: 127 mg/dL — ABNORMAL HIGH (ref 70–99)
Glucose-Capillary: 152 mg/dL — ABNORMAL HIGH (ref 70–99)
Glucose-Capillary: 144 mg/dL — ABNORMAL HIGH (ref 70–99)
Glucose-Capillary: 128 mg/dL — ABNORMAL HIGH (ref 70–99)
Glucose-Capillary: 124 mg/dL — ABNORMAL HIGH (ref 70–99)
Glucose-Capillary: 137 mg/dL — ABNORMAL HIGH (ref 70–99)
Glucose-Capillary: 131 mg/dL — ABNORMAL HIGH (ref 70–99)

## 2021-06-27 LAB — PHOSPHORUS: Phosphorus: 4 mg/dL (ref 2.5–4.6)

## 2021-06-27 MED ORDER — HYDROCHLOROTHIAZIDE 25 MG PO TABS
25.0000 mg | ORAL_TABLET | Freq: Every day | ORAL | Status: DC
  Administered 2021-06-27 – 2021-07-04 (×8): 25 mg
  Filled 2021-06-27 (×8): qty 1

## 2021-06-27 MED ORDER — EZETIMIBE 10 MG PO TABS
10.0000 mg | ORAL_TABLET | Freq: Every day | ORAL | Status: DC
  Administered 2021-06-27 – 2021-07-08 (×12): 10 mg
  Filled 2021-06-27 (×12): qty 1

## 2021-06-27 MED ORDER — AMLODIPINE BESYLATE 10 MG PO TABS
10.0000 mg | ORAL_TABLET | Freq: Every day | ORAL | Status: DC
  Administered 2021-06-27 – 2021-07-08 (×12): 10 mg
  Filled 2021-06-27 (×12): qty 1

## 2021-06-27 MED ORDER — LISINOPRIL 20 MG PO TABS
40.0000 mg | ORAL_TABLET | Freq: Every day | ORAL | Status: DC
Start: 2021-06-27 — End: 2021-07-08
  Administered 2021-06-27 – 2021-07-08 (×12): 40 mg
  Filled 2021-06-27 (×12): qty 2

## 2021-06-27 MED ORDER — ATORVASTATIN CALCIUM 40 MG PO TABS
40.0000 mg | ORAL_TABLET | Freq: Every day | ORAL | Status: DC
  Administered 2021-06-27 – 2021-07-05 (×9): 40 mg
  Filled 2021-06-27 (×9): qty 1

## 2021-06-27 MED ORDER — ASPIRIN 81 MG PO CHEW
81.0000 mg | CHEWABLE_TABLET | Freq: Every day | ORAL | Status: DC
  Administered 2021-06-27 – 2021-06-30 (×4): 81 mg
  Filled 2021-06-27 (×4): qty 1

## 2021-06-27 MED ORDER — MAGNESIUM HYDROXIDE 400 MG/5ML PO SUSP: 30.0000 mL | Freq: Every day | ORAL | Status: DC | PRN

## 2021-06-27 MED ORDER — LISINOPRIL 20 MG PO TABS: 40.0000 mg | ORAL_TABLET | Freq: Every day | ORAL | Status: DC

## 2021-06-27 MED ORDER — TICAGRELOR 90 MG PO TABS: 90.0000 mg | ORAL_TABLET | Freq: Two times a day (BID) | ORAL | Status: DC

## 2021-06-27 MED ORDER — METOPROLOL TARTRATE 25 MG PO TABS
25.0000 mg | ORAL_TABLET | Freq: Two times a day (BID) | ORAL | Status: DC
  Administered 2021-06-27 – 2021-06-28 (×2): 25 mg via ORAL
  Filled 2021-06-27 (×2): qty 1

## 2021-06-27 MED ORDER — ESCITALOPRAM OXALATE 10 MG PO TABS
10.0000 mg | ORAL_TABLET | Freq: Every day | ORAL | Status: DC
  Administered 2021-06-27 – 2021-06-29 (×3): 10 mg
  Filled 2021-06-27 (×3): qty 1

## 2021-06-27 MED ORDER — HYDRALAZINE HCL 50 MG PO TABS
50.0000 mg | ORAL_TABLET | Freq: Three times a day (TID) | ORAL | Status: DC
  Administered 2021-06-27 – 2021-07-02 (×15): 50 mg
  Filled 2021-06-27 (×15): qty 1

## 2021-06-27 MED ORDER — TICAGRELOR 90 MG PO TABS
90.0000 mg | ORAL_TABLET | Freq: Two times a day (BID) | ORAL | Status: DC
  Administered 2021-06-27 – 2021-06-30 (×8): 90 mg
  Filled 2021-06-27 (×8): qty 1

## 2021-06-27 MED ORDER — PANTOPRAZOLE 2 MG/ML SUSPENSION
40.0000 mg | Freq: Every day | ORAL | Status: DC
  Administered 2021-06-27 – 2021-07-08 (×11): 40 mg
  Filled 2021-06-27 (×12): qty 20

## 2021-06-27 NOTE — Progress Notes (Signed)
?  Progress Note ? ? ?Patient: [name] [id number] DOB: 1940-06-02 DOA: 06/24/2021     3 ?DOS: the patient was seen and examined on 06/27/2021 at 12:00PM ?  ? ? ? ?Brief hospital course: ?[name] is a 81 y.o. F with hx CVA residual left hemiparesis who presented after a fall and subsequent appearance of right hemiparesis and left gaze preference. ? ?In the ER, she had facial droop, decreased responsiveness.  BP 223/165 mmHg and MRI brain showed small L thalamic stroke. ? ?5/3: Admitted and antihypertensives started ?5/5: Failed modified barium, Cortrak placed ?5/6: No change ? ? ? ? ?Assessment and Plan: ?* Stroke ?-Continue aspirin, Brilinta ? ? ?Dysphagia ?-Continue tube feeds via core track ?- Continue speech therapy ?- May need PEG in order to go to SNF ? ? ?Hypertensive emergency ?Blood pressures improved ?-Continue amlodipine, hydralazine, HCTZ ?- Resume lisinopril ?- Continue PRNs for SBP > 180 ? ?Anxiety ?- Continue SSRI, bupropion ? ?Cerebrovascular disease ?- Continue aspirin, ticagrelor, atorvastatin, Zetia ?  ?  ? ? ? ? ? ? ? ? ?Subjective: Patient seems somewhat disoriented, still very dysarthric and unable to swallow, no fever, cough, diarrhea. ? ? ? ? ?Physical Exam: ?Vitals:  ? 06/27/21 0350 06/27/21 0741 06/27/21 1102 06/27/21 1534  ?BP: (!) 176/84 (!) 176/74 (!) 172/85 (!) 156/73  ?Pulse: 61 64 71 72  ?Resp:  15 17 18   ?Temp: 97.7 ?F (36.5 ?C) 98.1 ?F (36.7 ?C) 98.4 ?F (36.9 ?C) 97.6 ?F (36.4 ?C)  ?TempSrc: Axillary Oral Oral Oral  ?SpO2: 100% 99% 100% 99%  ?Weight:      ?Height:      ? ?Elderly adult female, lying in bed, appears weak and restless. ?Voice is dysarthric and hypophonic, severe generalized weakness, able to push up in bed, follows commands, face symmetric ?RRR, no murmurs, no peripheral edema ?Respiratory effort normal, lungs clear without rales or wheezes ?Abdomen soft no tenderness palpation or guarding   ? ?Data Reviewed: ?Neurology notes reviewed, nursing notes reviewed, vital  signs reviewed ?Phosphate and magnesium normal ? ?Family Communication:   ? ? ? ?Disposition: ?Status is: Inpatient ? ? ? ? ? ? ? ? ?Author: ?Edwin Dada, MD ?06/27/2021 5:46 PM ? ?For on call review [email or web address]  ? ? ?

## 2021-06-27 NOTE — Progress Notes (Signed)
Inpatient Rehabilitation Admissions Coordinator  ? ?I spoke with pt's sister, Joycelyn Schmid by phone. Patient has no caregiver supports as her daughter is handicapped and patient has no caregiver supports. She will need SNF. Joycelyn Schmid asks that SW contact her to make SNF arrangements. I have alerted acute team and TOC. We will sign off. ? ?Danne Baxter, RN, MSN ?Rehab Admissions Coordinator ?[phone] ?06/27/2021 5:09 PM ? ?

## 2021-06-27 NOTE — Progress Notes (Signed)
Speech Language Pathology Treatment: Dysphagia;Cognitive-Linquistic  ?[name] ?MRN: [id number] ?DOB: Mar 27, 1940 ?Today's Date: 06/27/2021 ?Time: 1206-1227 ?SLP Time Calculation (min) (ACUTE ONLY): 21 min ? ?Assessment / Plan / Recommendation ?Clinical Impression ? [name] had cortrak placed yesterday. Today, she assisted with oral care with toothbrush/[place], requiring verbal cues to initiate and sustain attention to task. Similar cues needed when provided with ice chips - verbal/tactile prompts provided to close lips, sustain chewing and then generate a swallow response.  Much of ice remained in oral cavity.  Swallow initiation appeared to be quite delayed, consistent with yesterday's MBS.  She verbalized spontaneously once during session, saying "ok." Otherwise, max cues/modeling required in order for her to respond yes/no to questions about basic preferences.  ? ?Continue NPO with cortrak due to persisting mod-severe dysphagia; please offer ice chips after providing oral care. D/W RN. SLP will follow. ?  ?HPI HPI: 81 y.o. female with medical history significant of HTN, remote stroke with residual left-sided weakness, anxiety/depression, HLD, sent by daughter for fall this afternoon.      Last known normal was around 10 AM today.  Daughter found patient on the floor this afternoon around 1 PM.  Patient obviously unable to talk but EMS arrived, EMS interviewed the daughter on scene but found little additional information.  EMS also found the patient's pill bottles not organized and some of her BP meds pill boxes full with expiration date already passed; MRI head on 06/25/21 indicated small acute infarct lateral L thalamus; old occipital infarct.  10/09/19 MBS while inpatient revealed significant oral dysphagia. ?  ?   ?SLP Plan ? Continue with current plan of care ? ?  ?  ?Recommendations for follow up therapy are one component of a multi-disciplinary discharge planning process, led by the  attending physician.  Recommendations may be updated based on patient status, additional functional criteria and insurance authorization. ?  ? ?Recommendations  ?Diet recommendations: NPO  ? Encourage ice chips after oral care  ?    ?   ? ? ? ? Oral Care Recommendations: Oral care QID;Oral care prior to ice chip/H20;Staff/trained caregiver to provide oral care ?Follow Up Recommendations: Acute inpatient rehab (3hours/day) ?Assistance recommended at discharge: Frequent or constant Supervision/Assistance ?SLP Visit Diagnosis: Dysphagia, oropharyngeal phase (R13.12) ?Plan: Continue with current plan of care ? ? ? ? ?  ?  ?[name] L. Barbara Fishman, MA CCC/SLP ?Acute Rehabilitation Services ?Office number [phone] ?Pager [phone] ? ? ?[name] ? ?06/27/2021, 12:29 PM ?

## 2021-06-27 NOTE — Progress Notes (Signed)
Bilateral hand mitts applied at this time as patient is attempting to pull at Small Bore Feeding tube in left nare.  ?

## 2021-06-28 ENCOUNTER — Inpatient Hospital Stay (HOSPITAL_COMMUNITY): Payer: Medicare HMO

## 2021-06-28 DIAGNOSIS — W19XXXA Unspecified fall, initial encounter: Secondary | ICD-10-CM | POA: Diagnosis not present

## 2021-06-28 DIAGNOSIS — I679 Cerebrovascular disease, unspecified: Secondary | ICD-10-CM | POA: Diagnosis not present

## 2021-06-28 DIAGNOSIS — I639 Cerebral infarction, unspecified: Secondary | ICD-10-CM | POA: Diagnosis not present

## 2021-06-28 DIAGNOSIS — R531 Weakness: Secondary | ICD-10-CM | POA: Diagnosis not present

## 2021-06-28 LAB — GLUCOSE, CAPILLARY
Glucose-Capillary: 125 mg/dL — ABNORMAL HIGH (ref 70–99)
Glucose-Capillary: 126 mg/dL — ABNORMAL HIGH (ref 70–99)
Glucose-Capillary: 126 mg/dL — ABNORMAL HIGH (ref 70–99)
Glucose-Capillary: 131 mg/dL — ABNORMAL HIGH (ref 70–99)
Glucose-Capillary: 132 mg/dL — ABNORMAL HIGH (ref 70–99)
Glucose-Capillary: 134 mg/dL — ABNORMAL HIGH (ref 70–99)

## 2021-06-28 MED ORDER — METOPROLOL TARTRATE 50 MG PO TABS
50.0000 mg | ORAL_TABLET | Freq: Two times a day (BID) | ORAL | Status: DC
  Administered 2021-06-28: 50 mg via ORAL
  Filled 2021-06-28: qty 1

## 2021-06-28 NOTE — Plan of Care (Signed)
  Problem: Education: Goal: Knowledge of General Education information will improve Description: Including pain rating scale, medication(s)/side effects and non-pharmacologic comfort measures Outcome: Progressing   Problem: Health Behavior/Discharge Planning: Goal: Ability to manage health-related needs will improve Outcome: Progressing   Problem: Clinical Measurements: Goal: Ability to maintain clinical measurements within normal limits will improve Outcome: Progressing Goal: Will remain free from infection Outcome: Progressing Goal: Diagnostic test results will improve Outcome: Progressing Goal: Respiratory complications will improve Outcome: Progressing Goal: Cardiovascular complication will be avoided Outcome: Progressing   Problem: Activity: Goal: Risk for activity intolerance will decrease Outcome: Progressing   Problem: Nutrition: Goal: Adequate nutrition will be maintained Outcome: Progressing   Problem: Coping: Goal: Level of anxiety will decrease Outcome: Progressing   Problem: Elimination: Goal: Will not experience complications related to bowel motility Outcome: Progressing Goal: Will not experience complications related to urinary retention Outcome: Progressing   Problem: Pain Managment: Goal: General experience of comfort will improve Outcome: Progressing   Problem: Safety: Goal: Ability to remain free from injury will improve Outcome: Progressing   Problem: Skin Integrity: Goal: Risk for impaired skin integrity will decrease Outcome: Progressing   Problem: Education: Goal: Knowledge of disease or condition will improve Outcome: Progressing Goal: Knowledge of secondary prevention will improve (SELECT ALL) Outcome: Progressing Goal: Knowledge of patient specific risk factors will improve (INDIVIDUALIZE FOR PATIENT) Outcome: Progressing Goal: Individualized Educational Video(s) Outcome: Progressing   Problem: Coping: Goal: Will verbalize  positive feelings about self Outcome: Progressing   

## 2021-06-28 NOTE — Plan of Care (Signed)
Pt. Aware she had a stroke and told about signs and symptoms but really unable to participate in education beyond that because of issues with communication. ?Problem: Education: ?Goal: Knowledge of disease or condition will improve ?Outcome: Progressing ?  ?

## 2021-06-28 NOTE — Progress Notes (Signed)
?  Progress Note ? ? ?Patient: [name] [id number] DOB: Mar 16, 1940 DOA: 06/24/2021     4 ?DOS: the patient was seen and examined on 06/28/2021 at 12:00PM ?  ? ? ? ?Brief hospital course: ?Mrs. Rechner is a 81 y.o. F with hx CVA residual left hemiparesis who presented L thalamic stroke.  Dysphagia and Cortrak placed.    ? ? ? ? ? ?Assessment and Plan: ?* Stroke ?-Continue aspirin, Brilinta ? ? ?Dysphagia ?Discussed the uncertain timeframe of this with daughter and sister today.  I recommend PEG in order to facilitate rehabilitation.  Family understand and agree that the patient would want to trial of PEG in order to attempt rehabilitation ? ?Daughter and sister are both POA. ?-Continue tube feeds ?- Continue speech therapy ?-Consult IR for PEG ? ? ?Hypertensive emergency ?Essential hypertension ?Blood pressures improving but still on 1 5170 range ?-Continue amlodipine, hydralazine, HCTZ, lisinopril ?- Increase metoprolol  ?- Continue PRNs for SBP > 180 ? ?Anxiety ?-Continue SSRI, bupropion ? ?Cerebrovascular disease ?-Continue aspirin, ticagrelor, atorvastatin, Zetia ?  ?  ? ? ? ? ? ? ? ? ?Subjective: Patient is weaker today, she was pulling at lines, not verbalizing much, appears restless.  When stimulated vigorously, she appears to have bilateral strength is equal in both arms and fairly normal.  She really does not verbalize anything, and she seems apathetic and energetic.  No fever, cough, diarrhea. ? ? ? ? ?Physical Exam: ?Vitals:  ? 06/28/21 0332 06/28/21 0353 06/28/21 0813 06/28/21 1201  ?BP: 138/77  (!) 177/76 (!) 150/70  ?Pulse: 77  65 (!) 56  ?Resp: 18  20 (!) 22  ?Temp: 98.5 ?F (36.9 ?C)  98.6 ?F (37 ?C) 98.5 ?F (36.9 ?C)  ?TempSrc: Oral  Oral Oral  ?SpO2: 100%  100% 100%  ?Weight:  61.7 kg    ?Height:      ? ?Elderly adult female, lying in bed, listless ?Really does not verbalize anything, as above appears listless, but when stimulated, seems to have fairly normal strength in bilateral upper extremities,  does not follow commands for leg strength testing, resist cares ?RRR, no murmurs, no peripheral edema ?Respiratory effort normal, lungs clear without rales or wheezes ?Abdomen soft nontender palpation or guarding  ? ?Data Reviewed: ?Discussed with neurology, nursing notes reviewed, vital signs reviewed ?Glucose normal   ?Basic metabolic panel and CBC ordered ? ?Family Communication: Sister and daughter by phone. ? ? ? ?Disposition: ?Status is: Inpatient ? ? ? ? ? ? ? ? ?Author: ?Edwin Dada, MD ?06/28/2021 3:22 PM ? ?For on call review [email or web address]  ? ? ?

## 2021-06-29 DIAGNOSIS — I679 Cerebrovascular disease, unspecified: Secondary | ICD-10-CM | POA: Diagnosis not present

## 2021-06-29 DIAGNOSIS — I161 Hypertensive emergency: Secondary | ICD-10-CM | POA: Diagnosis not present

## 2021-06-29 DIAGNOSIS — R7989 Other specified abnormal findings of blood chemistry: Secondary | ICD-10-CM | POA: Diagnosis not present

## 2021-06-29 DIAGNOSIS — R1310 Dysphagia, unspecified: Secondary | ICD-10-CM

## 2021-06-29 DIAGNOSIS — I639 Cerebral infarction, unspecified: Secondary | ICD-10-CM | POA: Diagnosis not present

## 2021-06-29 DIAGNOSIS — Z7189 Other specified counseling: Secondary | ICD-10-CM

## 2021-06-29 DIAGNOSIS — E440 Moderate protein-calorie malnutrition: Secondary | ICD-10-CM

## 2021-06-29 DIAGNOSIS — F419 Anxiety disorder, unspecified: Secondary | ICD-10-CM | POA: Diagnosis not present

## 2021-06-29 LAB — CBC
HCT: 42.2 % (ref 36.0–46.0)
Hemoglobin: 13.5 g/dL (ref 12.0–15.0)
MCH: 26 pg (ref 26.0–34.0)
MCHC: 32 g/dL (ref 30.0–36.0)
MCV: 81.3 fL (ref 80.0–100.0)
Platelets: 304 10*3/uL (ref 150–400)
RBC: 5.19 MIL/uL — ABNORMAL HIGH (ref 3.87–5.11)
RDW: 14.3 % (ref 11.5–15.5)
WBC: 6.4 10*3/uL (ref 4.0–10.5)
nRBC: 0 % (ref 0.0–0.2)

## 2021-06-29 LAB — CULTURE, BLOOD (ROUTINE X 2)
Culture: NO GROWTH
Culture: NO GROWTH

## 2021-06-29 LAB — BASIC METABOLIC PANEL
Anion gap: 9 (ref 5–15)
BUN: 24 mg/dL — ABNORMAL HIGH (ref 8–23)
CO2: 25 mmol/L (ref 22–32)
Calcium: 9.5 mg/dL (ref 8.9–10.3)
Chloride: 102 mmol/L (ref 98–111)
Creatinine, Ser: 0.91 mg/dL (ref 0.44–1.00)
GFR, Estimated: >60 mL/min (ref >60)
Glucose, Bld: 117 mg/dL — ABNORMAL HIGH (ref 70–99)
Potassium: 3.8 mmol/L (ref 3.5–5.1)
Sodium: 136 mmol/L (ref 135–145)

## 2021-06-29 LAB — GLUCOSE, CAPILLARY
Glucose-Capillary: 115 mg/dL — ABNORMAL HIGH (ref 70–99)
Glucose-Capillary: 118 mg/dL — ABNORMAL HIGH (ref 70–99)
Glucose-Capillary: 119 mg/dL — ABNORMAL HIGH (ref 70–99)
Glucose-Capillary: 120 mg/dL — ABNORMAL HIGH (ref 70–99)
Glucose-Capillary: 122 mg/dL — ABNORMAL HIGH (ref 70–99)
Glucose-Capillary: 129 mg/dL — ABNORMAL HIGH (ref 70–99)

## 2021-06-29 MED ORDER — ESCITALOPRAM OXALATE 10 MG PO TABS
10.0000 mg | ORAL_TABLET | Freq: Every day | ORAL | Status: DC
Start: 2021-06-29 — End: 2021-06-29

## 2021-06-29 MED ORDER — BUPROPION HCL 75 MG PO TABS
75.0000 mg | ORAL_TABLET | Freq: Two times a day (BID) | ORAL | Status: DC
  Administered 2021-06-29 – 2021-06-30 (×3): 75 mg via ORAL
  Filled 2021-06-29 (×4): qty 1

## 2021-06-29 MED ORDER — ESCITALOPRAM OXALATE 10 MG PO TABS
20.0000 mg | ORAL_TABLET | Freq: Every day | ORAL | Status: DC
Start: 2021-06-30 — End: 2021-07-08
  Administered 2021-06-30 – 2021-07-08 (×9): 20 mg
  Filled 2021-06-29 (×9): qty 2

## 2021-06-29 MED ORDER — SENNOSIDES-DOCUSATE SODIUM 8.6-50 MG PO TABS
1.0000 | ORAL_TABLET | Freq: Every evening | ORAL | Status: DC | PRN
  Administered 2021-07-03: 1
  Filled 2021-06-29: qty 1

## 2021-06-29 MED ORDER — METOPROLOL TARTRATE 50 MG PO TABS
50.0000 mg | ORAL_TABLET | Freq: Two times a day (BID) | ORAL | Status: DC
  Administered 2021-06-29 – 2021-07-01 (×4): 50 mg
  Filled 2021-06-29 (×5): qty 1

## 2021-06-29 NOTE — Progress Notes (Addendum)
Physical Therapy Treatment ?[name] ?MRN: [id number] ?DOB: 11-Mar-1940 ?Today's Date: 06/29/2021 ? ? ?History of Present Illness Pt is a 81 y/o female admitted secondary to R weakness and fall. MRI showed L thalamus infarct. PMH includes HTN and CVA. ? ?  ?PT Comments  ? ? Pt admitted with/for R sided weakness/fall, MRI showing L thalamic infarct.  Pt very slow to respond or process instruction, needing min to mod assist for basic mobility and gait.. Emphasis on transitions, transfer safety and squat pivot to the [facility], sit to stand safety and progression of gait stability, quality and stamina. ? ?   ?Recommendations for follow up therapy are one component of a multi-disciplinary discharge planning process, led by the attending physician.  Recommendations may be updated based on patient status, additional functional criteria and insurance authorization. ? ?Follow Up Recommendations ? Skilled nursing-short term rehab (<3 hours/day) ?  ?  ?Assistance Recommended at Discharge Frequent or constant Supervision/Assistance  ?Patient can return home with the following A lot of help with walking and/or transfers;A lot of help with bathing/dressing/bathroom;Assistance with cooking/housework;Direct supervision/assist for medications management;Direct supervision/assist for financial management;Assist for transportation;Help with stairs or ramp for entrance ?  ?Equipment Recommendations ? Other (comment) (TBA)  ?  ?Recommendations for Other Services Rehab consult ? ? ?  ?Precautions / Restrictions Precautions ?Precautions: Fall  ?  ? ?Mobility ? Bed Mobility ?Overal bed mobility: Needs Assistance ?Bed Mobility: Supine to Sit ?  ?  ?Supine to sit: Min assist, HOB elevated ?  ?  ?General bed mobility comments: pt is slow to respond/process.  Sometimes it appears to take up to 20 secs for a response.  Pt used her trunk and L UE somewhat to attempt coming straight up.  No use of the RUE unless cued hand over  hand. ?  ? ?Transfers ?Overall transfer level: Needs assistance ?Equipment used: Rolling walker (2 wheels) ?Transfers: Sit to/from Stand, Bed to chair/wheelchair/BSC ?Sit to Stand: Mod assist ?  ?  ?Squat pivot transfers: Mod assist ?  ?  ?General transfer comment: cues and hand over hand assist for hand placement.  Assist to come both forward and boost.  Pt taking extra time to process. ?  ? ?Ambulation/Gait ?Ambulation/Gait assistance: Min assist, Mod assist ?Gait Distance (Feet): 60 Feet ?Assistive device: Rolling walker (2 wheels) ?Gait Pattern/deviations: Step-through pattern, Decreased step length - right, Decreased step length - left, Decreased stance time - left, Decreased stride length ?Gait velocity: decreased ?Gait velocity interpretation: <1.31 ft/sec, indicative of household ambulator ?  ?General Gait Details: slow guarded at best with the RW.  As she goes too far and starts to fatigue she lags further and further behind the RW and becomes for halted.  Pt again takes as excessive amount of time to hear,process and begin to execute getting closer to the RW. ? ? ?Stairs ?  ?  ?  ?  ?  ? ? ?Wheelchair Mobility ?  ? ?Modified Rankin (Stroke Patients Only) ?Modified Rankin (Stroke Patients Only) ?Pre-Morbid Rankin Score: No significant disability ?Modified Rankin: Moderately severe disability ? ? ?  ?Balance Overall balance assessment: Needs assistance ?Sitting-balance support: No upper extremity supported, Feet supported ?Sitting balance-Leahy Scale: Fair ?  ?  ?Standing balance support: Bilateral upper extremity supported, During functional activity, Reliant on assistive device for balance ?Standing balance-Leahy Scale: Poor ?Standing balance comment: Reliant on BUE support ?  ?  ?  ?  ?  ?  ?  ?  ?  ?  ?  ?  ? ?  ?  Cognition Arousal/Alertness: Awake/alert, Lethargic ?Behavior During Therapy: Flat affect ?Overall Cognitive Status: No family/caregiver present to determine baseline cognitive functioning ?  ?   ?  ?  ?  ?  ?  ?  ?  ?  ?  ?  ?  ?  ?  ?  ?General Comments: Extremely slow processing. Following simple commands with increased time. No verbalizations during session. ?  ?  ? ?  ?Exercises Other Exercises ?Other Exercises: bil hip/knee flex/ext ROM with graded resistance x10 ?Other Exercises: bicep/tricep presses with graded resistance bil and graded assist as necessary R UE  x8-10 reps ? ?  ?General Comments   ?  ?  ? ?Pertinent Vitals/Pain Pain Assessment ?Pain Assessment: Faces ?Faces Pain Scale: No hurt ?Pain Intervention(s): Monitored during session  ? ? ?Home Living   ?  ?  ?  ?  ?  ?  ?  ?  ?  ?   ?  ?Prior Function    ?  ?  ?   ? ?PT Goals (current goals can now be found in the care plan section) Acute Rehab PT Goals ?Patient Stated Goal: unable to state ?PT Goal Formulation: Patient unable to participate in goal setting ?Time For Goal Achievement: 07/09/21 ?Potential to Achieve Goals: Good ?Progress towards PT goals: Progressing toward goals ? ?  ?Frequency ? ? ? Min 3X/week ? ? ? ?  ?PT Plan Discharge plan needs to be updated;Frequency needs to be updated  ? ? ?Co-evaluation   ?  ?  ?  ?  ? ?  ?AM-PAC PT "6 Clicks" Mobility   ?Outcome Measure ? Help needed turning from your back to your side while in a flat bed without using bedrails?: A Little ?Help needed moving from lying on your back to sitting on the side of a flat bed without using bedrails?: A Little ?Help needed moving to and from a bed to a chair (including a wheelchair)?: A Lot ?Help needed standing up from a chair using your arms (e.g., wheelchair or bedside chair)?: A Lot ?Help needed to walk in hospital room?: A Lot ?  ?6 Click Score: 12 ? ?  ?End of Session   ?Activity Tolerance: Patient tolerated treatment well ?Patient left: in chair;with call bell/phone within reach;with chair alarm set;with nursing/sitter in room ?Nurse Communication: Mobility status ?PT Visit Diagnosis: Other abnormalities of gait and mobility (R26.89);Difficulty in  walking, not elsewhere classified (R26.2);Other symptoms and signs involving the nervous system (R29.898) ?  ? ? ?Time: [id number] ?PT Time Calculation (min) (ACUTE ONLY): 32 min ? ?Charges:  $Gait Training: 8-22 mins ?$Therapeutic Activity: 8-22 mins          ?          ? ?06/29/2021 ? ?Ginger Carne., PT ?Acute Rehabilitation Services ?[phone]  (pager) ?[phone]  (office) ? ? ?Tessie Fass Kylene Zamarron ?06/29/2021, 1:14 PM ? ?

## 2021-06-29 NOTE — [place] FL2 (Signed)
?[place] MEDICAID FL2 LEVEL OF CARE SCREENING TOOL  ?  ? ?IDENTIFICATION  ?Patient Name: ?[name] Birthdate: Jul 14, 1940 Sex: female Admission Date (Current Location): ?06/24/2021  ?[place] and [place] Number: ? [place] ?  Facility and Address:  ?The . [facility], [place] [address], [place], [place] [address] ?     Provider Number: ?[id number]  ?Attending Physician Name and Address:  ?Edwin Dada, * ? Relative Name and Phone Number:  ?  ?   ?Current Level of Care: ?Hospital Recommended Level of Care: ?[place] Prior Approval Number: ?  ? ?Date Approved/Denied: ?  PASRR Number: ?[id number] ? ?Discharge Plan: ?SNF ?  ? ?Current Diagnoses: ?Patient Active Problem List  ? Diagnosis Date Noted  ? Malnutrition of moderate degree 06/26/2021  ? Elevated lactic acid level 06/25/2021  ? Cerebrovascular disease 06/25/2021  ? Anxiety 06/25/2021  ? Encounter for general adult medical examination without abnormal findings 10/17/2020  ? Hardening of the aorta (main artery of the heart) ([place]) 10/17/2020  ? Hemiplegia of nondominant side as late effect of cerebrovascular disease ([place]) 10/17/2020  ? Mild cognitive impairment 10/17/2020  ? Osteopenia 10/17/2020  ? Plantar callosity 10/17/2020  ? Restrictive lung disease 10/17/2020  ? Rhabdomyolysis 03/01/2020  ? Fall 03/01/2020  ? Lab test positive for detection of COVID-19 virus 03/01/2020  ? Hypotension 03/01/2020  ? Elevated troponin 03/01/2020  ? Left leg weakness 03/01/2020  ? Leg edema, right 03/01/2020  ? Acute ischemic stroke ([place]) 10/08/2019  ? Status post left rotator cuff repair 02/13/2018  ? Complete rotator cuff tear of left shoulder 01/27/2018  ? S/P left rotator cuff repair 01/27/2018  ? Mixed incontinence 04/30/2013  ? PVD (peripheral vascular disease) with claudication ([place]) 05/24/2012  ? Peripheral vascular disease, unspecified ([place]) 11/24/2011  ? Neck pain 06/26/2010  ? Bipolar disorder ([place]) 01/09/2010  ?  Hyperlipidemia 03/03/2009  ? Impaired fasting glucose 03/03/2009  ? HYPERCHOLESTEROLEMIA 04/21/2006  ? OBESITY, NOS 04/21/2006  ? Major depressive disorder, recurrent episode ([place]) 04/21/2006  ? TOBACCO DEPENDENCE 04/21/2006  ? Hypertensive emergency 04/21/2006  ? HAND PAIN 04/21/2006  ? ? ?Orientation RESPIRATION BLADDER Height & Weight   ?  ?Self, Situation, Place ? Normal Incontinent Weight: 135 lb 9.3 oz (61.5 kg) ?Height:  5\' 3"  (160 cm)  ?BEHAVIORAL SYMPTOMS/MOOD NEUROLOGICAL BOWEL NUTRITION STATUS  ?    Continent Feeding tube (Osmolite 1.5, 50 mL/hr)  ?AMBULATORY STATUS COMMUNICATION OF NEEDS Skin   ?Extensive Assist Non-Verbally Normal ?  ?  ?  ?    ?     ?     ? ? ?Personal Care Assistance Level of Assistance  ?Bathing, Feeding, Dressing Bathing Assistance: Maximum assistance ?Feeding assistance: Maximum assistance ?Dressing Assistance: Maximum assistance ?   ? ?Functional Limitations Info  ?Speech   ?  ?Speech Info: Impaired (incomprehensible, nods/gestures appropriately)  ? ? ?SPECIAL CARE FACTORS FREQUENCY  ?PT (By licensed PT), OT (By licensed OT), Speech therapy   ?  ?PT Frequency: 5x/wk ?OT Frequency: 5x/wk ?  ?  ?Speech Therapy Frequency: 5x/wk ?   ? ? ?Contractures Contractures Info: Not present  ? ? ?Additional Factors Info  ?Code Status, Allergies, Psychotropic Code Status Info: Full ?Allergies Info: Atorvastatin, Celecoxib, Ezetimibe, Penicillin G, Penicillins, Rosuvastatin, Shellfish Allergy, Zoloft (Sertraline) ?Psychotropic Info: Lexapro 10mg  daily ?  ?  ?   ? ?Current Medications (06/29/2021):  This is the current hospital active medication list ?Current Facility-Administered Medications  ?Medication Dose Route Frequency Provider Last  Rate Last Admin  ? acetaminophen (TYLENOL) tablet 650 mg  650 mg Oral Q4H PRN Wynetta Fines T, MD      ? Or  ? acetaminophen (TYLENOL) 160 MG/5ML solution 650 mg  650 mg Per Tube Q4H PRN Wynetta Fines T, MD      ? Or  ? acetaminophen (TYLENOL) suppository 650 mg  650  mg Rectal Q4H PRN Wynetta Fines T, MD      ? amLODipine (NORVASC) tablet 10 mg  10 mg Per Tube Daily [place], Suann Larry, MD   10 mg at 06/29/21 1036  ? aspirin chewable tablet 81 mg  81 mg Per Tube Daily Edwin Dada, MD   81 mg at 06/29/21 1036  ? atorvastatin (LIPITOR) tablet 40 mg  40 mg Per Tube Daily [place], Suann Larry, MD   40 mg at 06/29/21 1036  ? bisacodyl (DULCOLAX) suppository 10 mg  10 mg Rectal PRN Wynetta Fines T, MD      ? enoxaparin (LOVENOX) injection 40 mg  40 mg Subcutaneous Q24H Wynetta Fines T, MD   40 mg at 06/28/21 1811  ? escitalopram (LEXAPRO) tablet 10 mg  10 mg Per Tube Daily [place], Suann Larry, MD   10 mg at 06/29/21 1036  ? ezetimibe (ZETIA) tablet 10 mg  10 mg Per Tube Daily Edwin Dada, MD   10 mg at 06/29/21 1036  ? feeding supplement (OSMOLITE 1.5 CAL) liquid 1,000 mL  1,000 mL Per Tube Continuous Edwin Dada, MD 50 mL/hr at 06/29/21 0821 1,000 mL at 06/29/21 [id number]  ? feeding supplement (PROSource TF) liquid 45 mL  45 mL Per Tube BID Edwin Dada, MD   45 mL at 06/29/21 1037  ? free water 130 mL  130 mL Per Tube Q4H Edwin Dada, MD   130 mL at 06/29/21 0819  ? hydrALAZINE (APRESOLINE) injection 10 mg  10 mg Intravenous Q6H PRN Edwin Dada, MD   10 mg at 06/26/21 2007  ? hydrALAZINE (APRESOLINE) tablet 50 mg  50 mg Per Tube Q8H [place], Suann Larry, MD   50 mg at 06/29/21 0518  ? hydrochlorothiazide (HYDRODIURIL) tablet 25 mg  25 mg Per Tube Daily Edwin Dada, MD   25 mg at 06/29/21 1036  ? labetalol (NORMODYNE) injection 20 mg  20 mg Intravenous Q2H PRN Edwin Dada, MD   20 mg at 06/26/21 1745  ? lisinopril (ZESTRIL) tablet 40 mg  40 mg Per Tube Daily Dan[place], Suann Larry, MD   40 mg at 06/29/21 1036  ? magnesium hydroxide (MILK OF MAGNESIA) suspension 30 mL  30 mL Per Tube Daily PRN [place], Suann Larry, MD      ? MEDLINE mouth rinse  15 mL Mouth Rinse BID Edwin Dada, MD   15  mL at 06/29/21 1037  ? metoprolol tartrate (LOPRESSOR) tablet 50 mg  50 mg Per Tube BID Joselyn Glassman A, RPH      ? pantoprazole sodium (PROTONIX) 40 mg/20 mL oral suspension 40 mg  40 mg Per Tube Daily Dan[place], Suann Larry, MD   40 mg at 06/29/21 1037  ? senna-docusate (Senokot-S) tablet 1 tablet  1 tablet Per Tube QHS PRN Joselyn Glassman A, RPH      ? ticagrelor (BRILINTA) tablet 90 mg  90 mg Per Tube BID Edwin Dada, MD   90 mg at 06/29/21 1036  ? ? ? ?Discharge Medications: ?Please see discharge summary for a list of discharge medications. ? ?  Relevant Imaging Results: ? ?Relevant Lab Results: ? ? ?Additional Information ?SS#: [id number] ? ?Geralynn Ochs, LCSW ? ? ? ? ?

## 2021-06-29 NOTE — Assessment & Plan Note (Addendum)
Discussed with Neurology, in the setting of her prior lacunar infarcts, this stroke appears to be causing a more substantial bulbar deficit than this particular stroke otherwise would by itself.  The end result is that she has severe dysarthria and dysphagia, and it is possible she may never fully recover speech and swallowing ability. ? ?Overall, however, she had a good functional status and cognitive status prior to her stroke, it is possible that these swallowing and speech deficits would be isolated and she would have a good quality of life, making PEG reasonable. ? ?- Consult Palliative Care to discuss potentially devastating deficit in speech, swallowing, goals of care ? ?Based on initial discussions with POAs' (daughter and sister) will proceed with tentative planning for PEG after ticagrelor washout: ?- Consult IR ?- Hold Brilinta evening 5/10 ?- Hold aspirin 5/11 ?- Hold Lovenox 5/14 ?- Tentatively plan for PEG placement by IR on Monday 5/15 ? ? ?

## 2021-06-29 NOTE — Progress Notes (Signed)
?Progress Note ? ? ?Patient: [name] [id number] DOB: 25-Dec-1940 DOA: 06/24/2021     5 ?DOS: the patient was seen and examined on 06/29/2021 at 10:31AM ?  ? ? ? ?Brief hospital course: ?Mrs. Haske is a 81 y.o. F with hx CVA residual left hemiparesis who presented after a fall and subsequent appearance of right hemiparesis and left gaze preference. ? ?In the ER, she had facial droop, decreased responsiveness.  BP 223/165 mmHg and MRI brain showed small L thalamic stroke. ? ?5/3: Admitted and antihypertensives started ?5/5: Failed modified barium, Cortrak placed ? ? ? ? ?Assessment and Plan: ?* Acute ischemic stroke ([place]) ?MRI brain shows a small L thalamic stroke.   ?Discussed with Neurology, in the setting of her prior lacunar infarcts, this stroke appears to be causing a more substantial bulbar deficit than this particular stroke otherwise would by itself.  The end result is that she has severe dysarthria and dysphagia, and it is possible she may never fully recover speech and swallowing ability. ? ?Overall, however, if she had a good functional status and cognitive status prior to her stroke, it is possible that these swallowing and speech deficits would be isolated ? ?- Consult Palliative Care to discuss potentially devastating deficit in speech, swallowing, goals of care ?- Given her prior good functional status, and POAs' (daughter and sister)  Understanding that patient would wish to pursue aggressive rehab, I will put plans for PEG in place next week: ?- Hold aspirin 5/11 ?- Hold Brilinta evening 5/10 ?- Hold Lovenox 5/14 ?- Tentatively plan for PEG placement by IR on Monday 5/15 ? ?-Non-invasive angiography showed severe distal left M1 stenosis, 3 x 4 mm ACOM aneurysm, and 50% stenosis of the proximal right ICA  ?- Echo without cardiogenic source of  ?-Lipids ordered: LDL 70, continue atorvastatin, Zetia ? ?-Atrial fibrillation: she has had a few very brief (1-2 seconds) runs SVT on monitoring -> recommend  1 month cardiac event monitor at D/c ? ? ?Hypertensive emergency ?Initial BP >220/120 and encephalopathy and stroke. ? ?BP now improved, 130s-160s. ?- Continue amlodipine, hydralazine, HCTZ, lisinopril, metoprolol ? ? ?Dysphagia ?See above ?- Consult Palliative Care for family guidance ?- Tentatively plan for PEG placement by IR next Monday after Ticagrelor washout ? ?Malnutrition of moderate degree ? as evidenced by mild fat depletion, moderate muscle depletion and stroke with severe dysphagia ?- Consult dietitian ? ?Anxiety ?Family and nursing and I have some concern for depressive, apathetic reaction to this illness. ?- Resume bupropion as short acting (prior med held given couldn't crush) ?- Continue escitalopram, increase dose ? ?Cerebrovascular disease ?- Continue aspirin, atorvastatin, Zetia ? ?Elevated lactic acid level ?At this point I do not suspect that this elevated lactic acid is due to infection.  UA clear.  Chest x-ray clear. ? ?Fall ?- PT eval ? ? ? ? ? ? ? ? ? ?Subjective: Patient gave me no verbal responses.  Nursing report no fever, respiratory distress, confusion.  She was able to ambulate 60 feet with physical therapy, but she remains extremely slowed in her responses, and anergic. ? ? ? ? ?Physical Exam: ?Vitals:  ? 06/29/21 0430 06/29/21 0733 06/29/21 1141 06/29/21 1526  ?BP:  (!) 173/77 136/84 137/66  ?Pulse:  70 60 69  ?Resp:  16 16 16   ?Temp:  97.8 ?F (36.6 ?C) 97.7 ?F (36.5 ?C) 98.1 ?F (36.7 ?C)  ?TempSrc:  Oral Oral Oral  ?SpO2:  100% 100% 100%  ?Weight: 61.5 kg     ?  Height:      ? ?Thin adult female, lying in bed, poorly nourished, oropharynx appears dry from mouth breathing, opens eyes to voice, makes eye contact, severely bradyphrenic ?RRR, no murmurs, no peripheral edema ?Respiratory effort shallow, lungs clear without rales or wheezes ?Abdomen soft without grimace to palpation or masses in all 4 quadrants ?Attention seems diminished, does not respond verbally to any stimuli, takes 10  to 20 seconds to respond to any question, makes no verbalizations in response, does follow some commands slowly.  Strength appears 5/5 and symmetric in the upper extremities, she does not cooperate to strength testing in the lower extremities.  Her voice is hypophonic when she makes weak groaning noises.  Her tongue appears protruding.  Extraocular movements appear intact. ? ?Data Reviewed: ?Discussed with interventional radiology, neurology, palliative care, nursing notes reviewed, vital signs reviewed ?Patient metabolic panel and CBC reviewed ?CT of the abdomen and pelvis showed no abnormal findings.  Glucose reviewed. ? ?Family Communication: Daughter by phone ? ? ? ?Disposition: ?Status is: Inpatient ? ? ? ? ? ? ? ? ?Author: ?Edwin Dada, MD ?06/29/2021 4:06 PM ? ?For on call review [email or web address].  ? ? ?

## 2021-06-29 NOTE — Assessment & Plan Note (Addendum)
As evidenced by mild fat depletion, moderate muscle depletion and stroke with severe dysphagia ?- Consult dietitian ?

## 2021-06-29 NOTE — Plan of Care (Signed)
°  Problem: Education: °Goal: Knowledge of disease or condition will improve °Outcome: Not Progressing °Goal: Knowledge of secondary prevention will improve (SELECT ALL) °Outcome: Not Progressing °Goal: Knowledge of patient specific risk factors will improve (INDIVIDUALIZE FOR PATIENT) °Outcome: Not Progressing °  °
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
## 2021-06-30 ENCOUNTER — Inpatient Hospital Stay (HOSPITAL_COMMUNITY): Payer: Medicare HMO

## 2021-06-30 DIAGNOSIS — W19XXXA Unspecified fall, initial encounter: Secondary | ICD-10-CM | POA: Diagnosis not present

## 2021-06-30 DIAGNOSIS — R1310 Dysphagia, unspecified: Secondary | ICD-10-CM

## 2021-06-30 DIAGNOSIS — I679 Cerebrovascular disease, unspecified: Secondary | ICD-10-CM | POA: Diagnosis not present

## 2021-06-30 DIAGNOSIS — R531 Weakness: Secondary | ICD-10-CM | POA: Diagnosis not present

## 2021-06-30 DIAGNOSIS — I639 Cerebral infarction, unspecified: Secondary | ICD-10-CM | POA: Diagnosis not present

## 2021-06-30 DIAGNOSIS — I161 Hypertensive emergency: Secondary | ICD-10-CM | POA: Diagnosis not present

## 2021-06-30 LAB — GLUCOSE, CAPILLARY
Glucose-Capillary: 137 mg/dL — ABNORMAL HIGH (ref 70–99)
Glucose-Capillary: 128 mg/dL — ABNORMAL HIGH (ref 70–99)
Glucose-Capillary: 121 mg/dL — ABNORMAL HIGH (ref 70–99)
Glucose-Capillary: 115 mg/dL — ABNORMAL HIGH (ref 70–99)
Glucose-Capillary: 99 mg/dL (ref 70–99)
Glucose-Capillary: 109 mg/dL — ABNORMAL HIGH (ref 70–99)

## 2021-06-30 MED ORDER — VANCOMYCIN HCL IN DEXTROSE 1-5 GM/200ML-% IV SOLN
1000.0000 mg | INTRAVENOUS | Status: AC
  Administered 2021-07-06: 1000 mg via INTRAVENOUS
  Filled 2021-06-30: qty 200

## 2021-06-30 NOTE — Progress Notes (Signed)
SLP Cancellation Note ? ?[name] ?MRN: [id number] ?DOB: 1940-12-30 ? ? ?Cancelled treatment:       Reason Eval/Treat Not Completed: Attempted to see pt for ongoing swallowing and communication treatment but she was unavailable - working with other therapist and requiring nursing care. Will continue to follow. ? ? ? ?Mahala Menghini., M.A. CCC-SLP ?Acute Rehabilitation Services ?Office [phone] ? ?Secure chat preferred ? ?06/30/2021, 3:19 PM ?

## 2021-06-30 NOTE — Progress Notes (Signed)
?Progress Note ? ? ?Patient: [name] [id number] DOB: Nov 21, 1940 DOA: 06/24/2021     6 ?DOS: the patient was seen and examined on 06/30/2021 at 10:50AM ?  ? ? ? ?Brief hospital course: ?Mrs. Travieso is a 81 y.o. F with hx CVA residual left hemiparesis who presented after a fall and subsequent appearance of right hemiparesis and left gaze preference. ? ?In the ER, she had facial droop, decreased responsiveness.  BP 223/165 mmHg and MRI brain showed small L thalamic stroke. ? ?5/3: Admitted and antihypertensives started ?5/5: Failed modified barium, Cortrak placed ?5/8: IR and Palliative care consulted for ?PEG ? ? ? ? ?Assessment and Plan: ?* Acute ischemic stroke ([facility]) ?MRI brain shows a small L thalamic stroke.   ?See below "Dysphagia" about how overall clinical picture appears worse out of proportion to small extent of stroke. ? ?- Continue aspirin, Brilinta for now ?- Continue Zetia, atorvastatin ?- Continue BP control ? ?Non-invasive angiography showed severe distal left M1 stenosis, 3 x 4 mm ACOM aneurysm, and 50% stenosis of the proximal right ICA.  Echo without cardiogenic source of embolism.  ? ?Atrial fibrillation: she has had a few very brief (1-2 seconds) runs SVT on monitoring -> recommend 1 month cardiac event monitor at D/c ? ? ?Hypertensive emergency ?Initial BP >220/120 and encephalopathy and stroke. ? ?BP now improved, 130s-160s. ?- Continue amlodipine, hydralazine, HCTZ, lisinopril, metoprolol ? ? ?Goals of care, counseling/discussion ?  ? ?Dysphagia ?Discussed with Neurology, in the setting of her prior lacunar infarcts, this stroke appears to be causing a more substantial bulbar deficit than this particular stroke otherwise would by itself.  The end result is that she has severe dysarthria and dysphagia, and it is possible she may never fully recover speech and swallowing ability. ? ?Overall, however, she had a good functional status and cognitive status prior to her stroke, it is possible that  these swallowing and speech deficits would be isolated and she would have a good quality of life, making PEG reasonable. ? ?- Consult Palliative Care to discuss potentially devastating deficit in speech, swallowing, goals of care ? ?Based on initial discussions with POAs' (daughter and sister) will proceed with tentative planning for PEG after ticagrelor washout: ?- Consult IR ?- Hold Brilinta evening 5/10 ?- Hold aspirin 5/11 ?- Hold Lovenox 5/14 ?- Tentatively plan for PEG placement by IR on Monday 5/15 ? ? ? ?Malnutrition of moderate degree ?As evidenced by mild fat depletion, moderate muscle depletion and stroke with severe dysphagia ?- Consult dietitian ? ?Anxiety ?Family and nursing and I have some concern for depressive, apathetic reaction to this illness. ?- Continue home bupropion as short acting (prior med held given couldn't crush) ?- Continue escitalopram, dose increased here ? ?Cerebrovascular disease ?See above ? ?Elevated lactic acid level ?At this point I do not suspect that this elevated lactic acid is due to infection.  UA clear.  Chest x-ray clear. ? ?Fall ?- PT eval ? ? ? ? ? ? ? ? ? ?Subjective: Patient is nonverbal.  She is essentially unchanged from yesterday.  She had no fever.  Nursing report no respiratory distress, agitation.  She was able to stand and walk several feet with physical therapy yesterday. ? ? ? ? ?Physical Exam: ?Vitals:  ? 06/30/21 0500 06/30/21 0740 06/30/21 1133 06/30/21 1155  ?BP:  132/63 (!) 156/98   ?Pulse:  (!) 53 (!) 56 66  ?Resp:  18 16   ?Temp:  97.7 ?F (36.5 ?C) 97.7 ?  F (36.5 ?C)   ?TempSrc:  Oral Oral   ?SpO2:  95% 100%   ?Weight: 65.1 kg     ?Height:      ? ?Thin adult female, lying in bed, appears lethargic, sleepy, opens eyes to voice, makes no spontaneous verbalizations, other than some mild groans ?RRR, no murmurs, no peripheral edema ?Respiratory rate normal, lungs clear without rales or wheezes ? ? ?Data Reviewed: ?Nursing notes reviewed, vital signs  reviewed ?Glucose normal ? ? ? ? ? ?Disposition: ?Status is: Inpatient ?Remains inpatient appropriate because: She has had a stroke, and is now unable to swallow or speak. ? ?In addition she is extremely weak, and will require significant rehabilitation return to her prior level of function. ? ?Patient the extent of her strokes, neurology feel her swallowing may never recover but her strength should and that she is a good candidate to rehab to a resonable level of independence.  In light of that contrasting with her current sluggishness, family face difficult decisions regarding PEG. ? ?Tentatively paln for PEG next week.  Then d/c to SNF after.   ? ?If clinical status deteriorates before then, may need to re-evaluate goals of care ? ? ? ? ? ? ? ? ? ? ?Author: ?Alberteen Sam, MD ?06/30/2021 1:22 PM ? ?For on call review [email or web address]  ? ? ?

## 2021-06-30 NOTE — Consult Note (Signed)
? ?Chief Complaint: ?Patient was seen in consultation today for  ?Chief Complaint  ?Patient presents with  ? Weakness  ? Hypertension  ? ? ?Referring Physician(s): Dr. Maryfrances Bunnellanford ? ?Supervising Physician: Gilmer MorWagner, Jaime ? ?[facility] ? ?History of Present Illness: ?[name] is a 81 y.o. female with a medical history significant for HTN, remote strokes (1994 and 2021) with residual left-sided weakness/dysphagia and gait impairment, anxiety and depression. She [facility] after a fall at home with weakness and altered mental status. She was noted to have right-sided weakness, left facial droop and left neglect. Imaging and overall presentation was consistent with an acute left cerebral hemisphere stroke. She was stared on anticoagulation and has stabilized but has persistent dysarthria and dysphagia. She has failed her swallowing evaluations and has a cor-trak in place for nutrition. Her team is preparing her for SNF/Rehab.  ? ?Interventional Radiology has been asked to evaluate this patient for an image-guided gastrostomy tube to facilitate her long-term nutritional needs. Imaging reviewed and procedure approved by Dr. Miles CostainShick.  ? ?Past Medical History:  ?Diagnosis Date  ? Arthritis   ? Depression   ? High cholesterol   ? Hypertension   ? Pneumonia   ? Stroke [facility](HCC)   ? 1994 no residuals  ? ? ?Past Surgical History:  ?Procedure Laterality Date  ? ABDOMINAL HYSTERECTOMY    ? catheterization of the left superficial femoral artery  10/20/2009  ? SHOULDER ARTHROSCOPY WITH ROTATOR CUFF REPAIR AND SUBACROMIAL DECOMPRESSION Left 01/27/2018  ? Procedure: LEFT SHOULDER ARTHROSCOPY WITH DEBRIDEMENT, SUBACROMIAL DECOMPRESSION AND ROTATOR CUFF REPAIR;  Surgeon: Kathryne HitchBlackman, Christopher Y, MD;  Location: [facility] ORS;  Service: Orthopedics;  Laterality: Left;  ? SHOULDER SURGERY    ? Left with dr. Gayla DossBalckman arthroscopy with possible rotator cuff repair  ? TONSILLECTOMY     ? ? ?Allergies: ?Atorvastatin, Celecoxib, Ezetimibe, Penicillin g, Penicillins, Rosuvastatin, Shellfish allergy, and Zoloft [sertraline] ? ?Medications: ?Prior to Admission medications   ?Medication Sig Start Date End Date Taking? Authorizing Provider  ?albuterol (PROAIR HFA) 108 (90 Base) MCG/ACT inhaler 2 puffs every 6 hours as needed for wheezing    [provider]  ?amLODipine (NORVASC) 10 MG tablet Take 1 tablet (10 mg total) by mouth daily. 03/26/20   Medina-Vargas, Monina C, NP  ?amLODipine (NORVASC) 10 MG tablet Take 1 tablet    [provider]  ?aspirin 81 MG EC tablet Take one (1) tablet by mouth daily    [provider]  ?atorvastatin (LIPITOR) 10 MG tablet Take 1 tablet (10 mg total) by mouth daily. 03/26/20   Medina-Vargas, Monina C, NP  ?bisacodyl (DULCOLAX) 10 MG suppository Place 10 mg rectally as needed for moderate constipation.    [provider]  ?buPROPion (WELLBUTRIN XL) 150 MG 24 hr tablet Take 1 tablet (150 mg total) by mouth daily. 03/26/20   Medina-Vargas, Monina C, NP  ?buPROPion (WELLBUTRIN XL) 150 MG 24 hr tablet Take 1 tablet by mouth daily.    [provider]  ?Calcium Carbonate-Vitamin D (CALCIUM 600+D HIGH POTENCY PO) Take 1 tablet by mouth daily.     [provider]  ?Cholecalciferol (VITAMIN D) 2000 UNITS tablet Take 2,000 Units by mouth daily.    [provider]  ?clopidogrel (PLAVIX) 75 MG tablet Take 1 tablet (75 mg total) by mouth daily. 03/26/20   Medina-Vargas, Monina C, NP  ?clopidogrel (PLAVIX) 75 MG tablet Take 1 tablet by mouth daily.    [provider]  ?Elastic Bandages & Supports (MEDICAL COMPRESSION STOCKINGS) MISC 20-73mmHg knee high compression stockings 11/20/14   [provider]  ?escitalopram (LEXAPRO) 10 MG tablet 1  qam 03/26/20   Medina-Vargas, Monina C, NP  ?ezetimibe (ZETIA) 10 MG tablet Take 1 tablet (10 mg total) by mouth daily. 03/26/20   Medina-Vargas, Monina C, NP  ?hydrALAZINE  (APRESOLINE) 25 MG tablet Take 1 tablet by mouth 3 (three) times daily. 09/24/20   [provider]  ?hydrochlorothiazide (HYDRODIURIL) 25 MG tablet Take 1 tablet by mouth daily. 09/24/20   [provider]  ?HYDROcodone-acetaminophen (NORCO/VICODIN) 5-325 MG tablet Take 1-2 Tablets by mouth every 4hrs as needed for moderate pain 01/31/18   [provider]  ?lisinopril (ZESTRIL) 40 MG tablet Take 1 tablet by mouth daily. 10/08/20   [provider]  ?magnesium hydroxide (MILK OF MAGNESIA) 400 MG/5ML suspension Take 30 mLs by mouth daily as needed for mild constipation.    [provider]  ?metoprolol tartrate (LOPRESSOR) 50 MG tablet Take 1 tablet (50 mg total) by mouth daily. 03/26/20   Medina-Vargas, Monina C, NP  ?mirabegron ER (MYRBETRIQ) 25 MG TB24 tablet Take 1 tablet (25 mg total) by mouth daily. 03/26/20   Medina-Vargas, Monina C, NP  ?pantoprazole (PROTONIX) 20 MG tablet Take 20 mg by mouth daily.    [provider]  ?Sodium Phosphates (RA SALINE ENEMA RE) Place 1 each rectally daily as needed.    [provider]  ?  ? ?Family History  ?Problem Relation Age of Onset  ? Cancer Mother   ? Diabetes Mother   ? Heart disease Mother   ? Hypertension Mother   ? Heart attack Mother   ? Hyperlipidemia Mother   ? Hypertension Father   ? Diabetes Sister   ? Heart disease Sister   ? Hyperlipidemia Sister   ? Hypertension Sister   ? Cancer Sister   ? Diabetes Brother   ? Heart disease Brother   ? Hypertension Brother   ? Cancer Brother   ? Hyperlipidemia Brother   ? Heart disease Son   ? Heart attack Son   ?     amputation  ? ? ?Social History  ? ?Socioeconomic History  ? Marital status: Widowed  ?  Spouse name: Not on file  ? Number of children: Not on file  ? Years of education: Not on file  ? Highest education level: Not on file  ?Occupational History  ? Not on file  ?Tobacco Use  ? Smoking status: Former  ?  Packs/day: 0.50  ?  Types: Cigarettes  ?  Quit date:  12/17/2017  ?  Years since quitting: 3.5  ? Smokeless tobacco: Never  ? Tobacco comments:  ?  using nic patches  ?Vaping Use  ? Vaping Use: Never used  ?Substance and Sexual Activity  ? Alcohol use: Not Currently  ? Drug use: No  ? Sexual activity: Not Currently  ?Other Topics Concern  ? Not on file  ?Social History Narrative  ? Not on file  ? ?Social Determinants of Health  ? ?Financial Resource Strain: Not on file  ?Food Insecurity: Not on file  ?Transportation Needs: Not on file  ?Physical Activity: Not on file  ?Stress: Not on file  ?Social Connections: Not on file  ? ? ?Review of Systems: A 12 point ROS discussed and pertinent positives are indicated in the HPI above.  All other systems are negative. ? ?Review of Systems  ?Unable to perform  ROS: Mental status change  ? ?Vital Signs: ?BP (!) 156/98 (BP Location: Left Arm)   Pulse 66   Temp 97.7 ?F (36.5 ?C) (Oral)   Resp 16   Ht 5\' 3"  (1.6 m)   Wt 143 lb 8.3 oz (65.1 kg)   SpO2 100%   BMI 25.42 kg/m?  ? ?Physical Exam ?Constitutional:   ?   General: She is not in acute distress. ?   Appearance: She is not ill-appearing.  ?HENT:  ?   Nose:  ?   Comments: Cor-trak ?   Mouth/Throat:  ?   Mouth: Mucous membranes are dry.  ?   Pharynx: Oropharynx is clear.  ?Cardiovascular:  ?   Rate and Rhythm: Normal rate and regular rhythm.  ?   Pulses: Normal pulses.  ?   Heart sounds: Normal heart sounds.  ?Pulmonary:  ?   Effort: Pulmonary effort is normal.  ?   Breath sounds: Normal breath sounds.  ?Abdominal:  ?   General: Bowel sounds are normal.  ?   Palpations: Abdomen is soft.  ?Musculoskeletal:  ?   Right lower leg: No edema.  ?   Left lower leg: No edema.  ?Skin: ?   General: Skin is warm and dry.  ?Neurological:  ?   Mental Status: She is alert. She is disoriented.  ?   Comments: Intermittently follows commands. Able to squeeze my hand with her left hand.   ? ? ?Imaging: ?CT ABDOMEN WO CONTRAST ? ?Result Date: 06/28/2021 ?CLINICAL DATA:  Preoperative exam prior to  gastrostomy tube placement EXAM: CT ABDOMEN WITHOUT CONTRAST TECHNIQUE: Multidetector CT imaging of the abdomen was performed following the standard protocol without IV contrast. RADIATION DOSE REDUCTION: This exam was performed according to the

## 2021-06-30 NOTE — Progress Notes (Addendum)
Occupational Therapy Treatment ?[name] ?MRN: [id number] ?DOB: 08-20-1940 ?Today's Date: 06/30/2021 ? ? ?History of present illness Pt is a 81 y/o female admitted secondary to R weakness and fall. MRI showed L thalamus infarct. PMH includes HTN and CVA. ?  ?OT comments ? Pt seen for OT session, completes in room ambulation to sink with minA using RW, moderate cuing for safety and very slow speed. Pt nodding head "yes" and "no" during session appropriately to questions asked, requires increased time to respond. Pt with posterior lean during ambulation back to bed, followed by syncope and incontinent episode. Pt lowered to therapist's knee in lunged position with head and trunk on mattress of bed. Pt's head/trunk did not make contact with floor or bedrails. Total A +2 to return pt to bed. Pt's BP 74/49 supine in bed, 127/59 after ~3 mins (see vitals flowsheet). RN/NT/MD aware and in room at end of session. Pt lethargic and drowsy after episode, difficulty opening eyes when asked/sternal rub by MD. Pt presenting with impairments listed below, will follow acutely. Updating d/c recommendation to SNF.  ? ?Recommendations for follow up therapy are one component of a multi-disciplinary discharge planning process, led by the attending physician.  Recommendations may be updated based on patient status, additional functional criteria and insurance authorization. ?   ?Follow Up Recommendations ? Skilled nursing-short term rehab (<3 hours/day)  ?  ?Assistance Recommended at Discharge Frequent or constant Supervision/Assistance  ?Patient can return home with the following ? A lot of help with walking and/or transfers;A lot of help with bathing/dressing/bathroom;Assistance with cooking/housework;Direct supervision/assist for medications management;Assist for transportation;Direct supervision/assist for financial management;Help with stairs or ramp for entrance ?  ?Equipment Recommendations ? None recommended  by OT;Other (comment) (defer to next venue of care)  ?  ?Recommendations for Other Services   ? ?  ?Precautions / Restrictions Precautions ?Precautions: Fall ?Precaution Comments: watch BP & HR, syncopal episode 5/9 ?Restrictions ?Weight Bearing Restrictions: No  ? ? ?  ? ?Mobility Bed Mobility ?Overal bed mobility: Needs Assistance ?Bed Mobility: Supine to Sit ?  ?  ?Supine to sit: Mod assist ?Sit to supine: +2 for physical assistance, Total assist ?  ?General bed mobility comments: total A +2 due to syncopal episode ?  ? ?Transfers ?Overall transfer level: Needs assistance ?Equipment used: Rolling walker (2 wheels) ?Transfers: Sit to/from Stand ?Sit to Stand: Mod assist ?  ?  ?  ?  ?  ?  ?  ?  ?Balance Overall balance assessment: Needs assistance ?Sitting-balance support: No upper extremity supported, Feet supported ?Sitting balance-Leahy Scale: Fair ?  ?  ?Standing balance support: Bilateral upper extremity supported, During functional activity, Reliant on assistive device for balance ?Standing balance-Leahy Scale: Poor ?Standing balance comment: Reliant on BUE support ?  ?  ?  ?  ?  ?  ?  ?  ?  ?  ?  ?   ? ?ADL either performed or assessed with clinical judgement  ? ?ADL Overall ADL's : Needs assistance/impaired ?  ?  ?Grooming: Moderate assistance;Standing;Sitting ?Grooming Details (indicate cue type and reason): hand over hand assistance to guide, needing increased time ?  ?  ?  ?  ?  ?  ?  ?  ?Toilet Transfer: Moderate assistance;Ambulation ?Toilet Transfer Details (indicate cue type and reason): increased time and cuing to step/weight shift ?  ?  ?  ?  ?Functional mobility during ADLs: Moderate assistance;Rolling walker (2 wheels) ?  ?  ? ?Extremity/Trunk  Assessment Upper Extremity Assessment ?Upper Extremity Assessment: Generalized weakness ?  ?Lower Extremity Assessment ?Lower Extremity Assessment: Defer to PT evaluation ?  ?  ?  ? ?Vision   ?Vision Assessment?: No apparent visual deficits ?  ?Perception  Perception ?Perception: Not tested ?  ?Praxis Praxis ?Praxis: Not tested ?  ? ?Cognition Arousal/Alertness: Awake/alert, Lethargic ?Behavior During Therapy: Flat affect ?Overall Cognitive Status: Impaired/Different from baseline ?  ?  ?  ?  ?  ?  ?  ?  ?  ?  ?  ?  ?  ?  ?  ?  ?General Comments: Extremely slow processing. Following simple commands with increased time. No verbalizations during session, will nod head " yes" and "no" to questions asked appropriately ?  ?  ?   ?Exercises   ? ?  ?Shoulder Instructions   ? ? ?  ?General Comments pt with syncopal episode at end of session (BP 74/49) upon returning to bed. Pt with decreased responsiveness, posterior lean, and urine incontinence. BP (127/59) after ~3 mins supine in bed  ? ? ?Pertinent Vitals/ Pain       Pain Assessment ?Pain Assessment: Faces ?Pain Score: 0-No pain ?Pain Intervention(s): Limited activity within patient's tolerance, Monitored during session ? ?Home Living   ?  ?  ?  ?  ?  ?  ?  ?  ?  ?  ?  ?  ?  ?  ?  ?  ?  ?  ? ?  ?Prior Functioning/Environment    ?  ?  ?  ?   ? ?Frequency ? Min 2X/week  ? ? ? ? ?  ?Progress Toward Goals ? ?OT Goals(current goals can now be found in the care plan section) ? Progress towards OT goals: Not progressing toward goals - comment (due to syncopal episode) ? ?Acute Rehab OT Goals ?Patient Stated Goal: none stated ?OT Goal Formulation: With patient/family ?Time For Goal Achievement: 07/09/21 ?Potential to Achieve Goals: Good ?ADL Goals ?Pt Will Perform Grooming: with modified independence;standing ?Pt Will Perform Lower Body Bathing: with supervision;sitting/lateral leans;sit to/from stand ?Pt Will Perform Lower Body Dressing: with supervision;sitting/lateral leans;sit to/from stand ?Pt Will Transfer to Toilet: with supervision;ambulating ?Pt Will Perform Toileting - Clothing Manipulation and hygiene: with supervision;sitting/lateral leans;sit to/from stand  ?Plan Frequency remains appropriate;Discharge plan needs to  be updated   ? ?Co-evaluation ? ? ?   ?  ?  ?  ?  ? ?  ?AM-PAC OT "6 Clicks" Daily Activity     ?Outcome Measure ? ? Help from another person eating meals?: A Lot ?Help from another person taking care of personal grooming?: A Lot ?Help from another person toileting, which includes using toliet, bedpan, or urinal?: Total ?Help from another person bathing (including washing, rinsing, drying)?: A Lot ?Help from another person to put on and taking off regular upper body clothing?: A Lot ?Help from another person to put on and taking off regular lower body clothing?: Total ?6 Click Score: 10 ? ?  ?End of Session Equipment Utilized During Treatment: Gait belt;Rolling walker (2 wheels) ? ?OT Visit Diagnosis: Unsteadiness on feet (R26.81);Other abnormalities of gait and mobility (R26.89);Muscle weakness (generalized) (M62.81) ?  ?Activity Tolerance Treatment limited secondary to medical complications (Comment) (syncopal episode) ?  ?Patient Left in bed;with call bell/phone within reach;with bed alarm set;with nursing/sitter in room;with family/visitor present (MD in room) ?  ?Nurse Communication Mobility status;Other (comment) (RN present in room during syncopal episode, notified of activity done during therapy  session) ?  ? ?   ? ?Time: [id number] ?OT Time Calculation (min): 51 min ? ?Charges: OT General Charges ?$OT Visit: 1 Visit ?OT Treatments ?$Self Care/Home Management : 38-52 mins ? ?Lynnda Child, OTD, OTR/L ?Acute Rehab ?[phone] ? ? ?Kaylyn Lim ?06/30/2021, 5:12 PM ?

## 2021-06-30 NOTE — Progress Notes (Signed)
? ?                                                                                                                                                     ?                                                   ?Daily Progress Note  ? ?Patient Name: [name]       Date: 06/30/2021 ?DOB: 1940-03-07  Age: 81 y.o. MRN#: [id number] ?Attending Physician: Alberteen Sam, * ?Primary Care Physician: Cleatis Polka., MD ?Admit Date: 06/24/2021 ? ?Reason for Consultation/Follow-up: Establishing goals of care ? ?Subjective: ?Medical records reviewed. Patient assessed at the bedside. She remains lethargic, does not wake up to my voice and did not attempt further to allow rest. ? ?I called patient's daughter [name] for ongoing GOC discussion and support. She has not yet discussed code status with [name], but plans to do so tonight. She would want patient to undergo resuscitation, but also understands the importance of respecting patient's wishes and making decisions she would want for herself.  ? ?We discussed input from patient's attendings and I shared that patient is at risk to live with the effects of the stroke on her speech and swallowing without improvement, even after intensive therapeutic efforts. Counseled on the importance of considering whether patient would want to live with these impacts to her quality of life for a prolonged time period.   ? ?Questions and concerns addressed. PMT will continue to support holistically.  ? ?Length of Stay: 6 ? ?Current Medications: ?Scheduled Meds:  ? amLODipine  10 mg Per Tube Daily  ? aspirin  81 mg Per Tube Daily  ? atorvastatin  40 mg Per Tube Daily  ? buPROPion  75 mg Oral BID  ? enoxaparin (LOVENOX) injection  40 mg Subcutaneous Q24H  ? escitalopram  20 mg Per Tube Daily  ? ezetimibe  10 mg Per Tube Daily  ? feeding supplement (PROSource TF)  45 mL Per Tube BID  ? free water  130 mL Per Tube Q4H  ? hydrALAZINE  50 mg Per Tube Q8H  ? hydrochlorothiazide  25 mg Per Tube  Daily  ? lisinopril  40 mg Per Tube Daily  ? mouth rinse  15 mL Mouth Rinse BID  ? metoprolol tartrate  50 mg Per Tube BID  ? pantoprazole sodium  40 mg Per Tube Daily  ? ticagrelor  90 mg Per Tube BID  ? ? ?Continuous Infusions: ? feeding supplement (OSMOLITE 1.5 CAL) 1,000 mL (06/29/21 5597)  ? [START ON 07/06/2021] vancomycin    ? ? ?PRN Meds: ?acetaminophen **OR** acetaminophen (TYLENOL)  oral liquid 160 mg/5 mL **OR** acetaminophen, bisacodyl, hydrALAZINE, labetalol, magnesium hydroxide, senna-docusate ? ?Physical Exam ?Vitals and nursing note reviewed.  ?Constitutional:   ?   General: She is not in acute distress. ?   Appearance: She is ill-appearing.  ?Cardiovascular:  ?   Rate and Rhythm: Normal rate.  ?Pulmonary:  ?   Effort: Pulmonary effort is normal.  ?Skin: ?   General: Skin is warm and dry.  ?Neurological:  ?   Mental Status: She is lethargic.  ?         ? ?Vital Signs: BP (!) 156/98 (BP Location: Left Arm)   Pulse 66   Temp 97.7 ?F (36.5 ?C) (Oral)   Resp 16   Ht 5\' 3"  (1.6 m)   Wt 65.1 kg   SpO2 100%   BMI 25.42 kg/m?  ?SpO2: SpO2: 100 % ?O2 Device: O2 Device: Room Air ?O2 Flow Rate:   ? ?Intake/output summary:  ?Intake/Output Summary (Last 24 hours) at 06/30/2021 1505 ?Last data filed at 06/30/2021 0441 ?Gross per 24 hour  ?Intake 702.5 ml  ?Output 800 ml  ?Net -97.5 ml  ? ?LBM: Last BM Date : 06/27/21 ?Baseline Weight: Weight: 72.6 kg ?Most recent weight: Weight: 65.1 kg ? ?     ?Palliative Assessment/Data: 40% (on TF) ? ? ? ? ? ?Patient Active Problem List  ? Diagnosis Date Noted  ? Dysphagia 06/29/2021  ? Goals of care, counseling/discussion   ? Malnutrition of moderate degree 06/26/2021  ? Elevated lactic acid level 06/25/2021  ? Cerebrovascular disease 06/25/2021  ? Anxiety 06/25/2021  ? Encounter for general adult medical examination without abnormal findings 10/17/2020  ? Hardening of the aorta (main artery of the heart) (HCC) 10/17/2020  ? Hemiplegia of nondominant side as late effect of  cerebrovascular disease (HCC) 10/17/2020  ? Mild cognitive impairment 10/17/2020  ? Osteopenia 10/17/2020  ? Plantar callosity 10/17/2020  ? Restrictive lung disease 10/17/2020  ? Rhabdomyolysis 03/01/2020  ? Fall 03/01/2020  ? Lab test positive for detection of COVID-19 virus 03/01/2020  ? Hypotension 03/01/2020  ? Elevated troponin 03/01/2020  ? Left leg weakness 03/01/2020  ? Leg edema, right 03/01/2020  ? Acute ischemic stroke (HCC) 10/08/2019  ? Status post left rotator cuff repair 02/13/2018  ? Complete rotator cuff tear of left shoulder 01/27/2018  ? S/P left rotator cuff repair 01/27/2018  ? Mixed incontinence 04/30/2013  ? PVD (peripheral vascular disease) with claudication (HCC) 05/24/2012  ? Peripheral vascular disease, unspecified (HCC) 11/24/2011  ? Neck pain 06/26/2010  ? Hyperlipidemia 03/03/2009  ? Impaired fasting glucose 03/03/2009  ? HYPERCHOLESTEROLEMIA 04/21/2006  ? OBESITY, NOS 04/21/2006  ? Major depressive disorder, recurrent episode (HCC) 04/21/2006  ? TOBACCO DEPENDENCE 04/21/2006  ? Hypertensive emergency 04/21/2006  ? HAND PAIN 04/21/2006  ? ? ?Palliative Care Assessment & Plan  ? ?Patient Profile: ?81 y.o. female with past medical history of  HTN, remote stroke with residual left-sided weakness, anxiety/depression, HLD admitted on 06/24/2021 with fall, significant right-sided weakness and left facial droop as well as left neglect. ?  ?Patient admitted with CVA, dysphagia, hypertensive emergency. PMT has been consulted to assist with medical decision making and goals of care regarding potential PEG placement. ? ?Assessment: ?Acute ischemic stroke, L thalamic ?Severe dysphagia ?Severe dysarthria  ?HTN ?Goals of care conversation ? ?Recommendations/Plan: ?Patient's family will continue to discuss and consider code status ?Continue full scope treatment, family remains hopeful and in favor of PEG placement next week ?PMT will continue to follow ? ? ?  Prognosis: ? Guarded ? ?Discharge  Planning: ?Skilled Nursing Facility for rehab with Palliative care service follow-up ? ? ? ?MDM: High ? ? ?Barbara DoppJosseline Jerriyah Louis, PA-C ?Palliative Medicine Team ?Team phone # [phone] ? ?Thank you for allowing the Palliative Medicine Team to assist in the care of this patient. Please utilize secure chat with additional questions, if there is no response within 30 minutes please call the above phone number. ? ?Palliative Medicine Team providers are available by phone from 7am to 7pm daily and can be reached through the team cell phone.  ?Should this patient require assistance outside of these hours, please call the patient's attending physician.  ? ? ? ? ? ?

## 2021-07-01 DIAGNOSIS — I639 Cerebral infarction, unspecified: Secondary | ICD-10-CM | POA: Diagnosis not present

## 2021-07-01 LAB — GLUCOSE, CAPILLARY
Glucose-Capillary: 102 mg/dL — ABNORMAL HIGH (ref 70–99)
Glucose-Capillary: 108 mg/dL — ABNORMAL HIGH (ref 70–99)
Glucose-Capillary: 93 mg/dL (ref 70–99)
Glucose-Capillary: 135 mg/dL — ABNORMAL HIGH (ref 70–99)
Glucose-Capillary: 116 mg/dL — ABNORMAL HIGH (ref 70–99)
Glucose-Capillary: 112 mg/dL — ABNORMAL HIGH (ref 70–99)

## 2021-07-01 LAB — CBC
HCT: 42.7 % (ref 36.0–46.0)
Hemoglobin: 13.4 g/dL (ref 12.0–15.0)
MCH: 25.5 pg — ABNORMAL LOW (ref 26.0–34.0)
MCHC: 31.4 g/dL (ref 30.0–36.0)
MCV: 81.3 fL (ref 80.0–100.0)
Platelets: 300 10*3/uL (ref 150–400)
RBC: 5.25 MIL/uL — ABNORMAL HIGH (ref 3.87–5.11)
RDW: 14.1 % (ref 11.5–15.5)
WBC: 6.5 10*3/uL (ref 4.0–10.5)
nRBC: 0 % (ref 0.0–0.2)

## 2021-07-01 LAB — COMPREHENSIVE METABOLIC PANEL
ALT: 12 U/L (ref 0–44)
AST: 23 U/L (ref 15–41)
Albumin: 3.9 g/dL (ref 3.5–5.0)
Alkaline Phosphatase: 42 U/L (ref 38–126)
Anion gap: 8 (ref 5–15)
BUN: 26 mg/dL — ABNORMAL HIGH (ref 8–23)
CO2: 25 mmol/L (ref 22–32)
Calcium: 9.6 mg/dL (ref 8.9–10.3)
Chloride: 103 mmol/L (ref 98–111)
Creatinine, Ser: 0.89 mg/dL (ref 0.44–1.00)
GFR, Estimated: >60 mL/min (ref >60)
Glucose, Bld: 95 mg/dL (ref 70–99)
Potassium: 3.6 mmol/L (ref 3.5–5.1)
Sodium: 136 mmol/L (ref 135–145)
Total Bilirubin: 1.4 mg/dL — ABNORMAL HIGH (ref 0.3–1.2)
Total Protein: 6.9 g/dL (ref 6.5–8.1)

## 2021-07-01 LAB — VITAMIN B12: Vitamin B-12: 508 pg/mL (ref 180–914)

## 2021-07-01 LAB — RPR: RPR Ser Ql: NONREACTIVE

## 2021-07-01 LAB — TSH: TSH: 1.337 u[IU]/mL (ref 0.350–4.500)

## 2021-07-01 MED ORDER — FREE WATER: 130.0000 mL | Status: DC

## 2021-07-01 MED ORDER — FREE WATER: 100.0000 mL | Status: DC

## 2021-07-01 MED ORDER — BUPROPION HCL 75 MG PO TABS
75.0000 mg | ORAL_TABLET | Freq: Two times a day (BID) | ORAL | Status: DC
  Administered 2021-07-01 – 2021-07-08 (×15): 75 mg
  Filled 2021-07-01 (×15): qty 1

## 2021-07-01 MED ORDER — FREE WATER
100.0000 mL | Status: DC
  Administered 2021-07-01 – 2021-07-04 (×19): 100 mL

## 2021-07-01 NOTE — Progress Notes (Signed)
Speech Language Pathology Treatment: Dysphagia  ?[name] ?MRN: [id number] ?DOB: 1940-10-31 ?Today's Date: 07/01/2021 ?Time: [id number] ?SLP Time Calculation (min) (ACUTE ONLY): 20 min ? ?Assessment / Plan / Recommendation ?Clinical Impression ? Patient seen by SLP for skilled treatment session focused on dysphagia goals. Patient was awake, alert and attentive, no attempts at verbalization but a couple instance of faint vocalizations observed. At rest, tongue protruding out of mouth but patient did exhibit some mobility, retracting it into mouth during oral care and in response to pudding thick apple juice placed in anterior portion of oral cavity. When PO's presented, patient did exhibit mild lingual and bilabial movements and although did transit boluses minimally, no swallow initiation observed. SLP suctioned out PO's that had been given and patient then starting to close eyes. SLP will continue to follow patient for dysphagia, dysarthria and cognitive goals but do anticipate she will require longer term alternative nutrition if aligned with her [place]. ?  ?HPI HPI: 81 y.o. female with medical history significant of HTN, remote stroke with residual left-sided weakness, anxiety/depression, HLD, sent by daughter for fall this afternoon.      Last known normal was around 10 AM today.  Daughter found patient on the floor this afternoon around 1 PM.  Patient obviously unable to talk but EMS arrived, EMS interviewed the daughter on scene but found little additional information.  EMS also found the patient's pill bottles not organized and some of her BP meds pill boxes full with expiration date already passed; MRI head on 06/25/21 indicated small acute infarct lateral L thalamus; old occipital infarct.  10/09/19 MBS while inpatient revealed significant oral dysphagia. ?  ?   ?SLP Plan ? Continue with current plan of care ? ?  ?  ?Recommendations for follow up therapy are one component of a  multi-disciplinary discharge planning process, led by the attending physician.  Recommendations may be updated based on patient status, additional functional criteria and insurance authorization. ?  ? ?Recommendations  ?Diet recommendations: NPO ?Medication Administration: Crushed with puree ?Supervision: Full supervision/cueing for compensatory strategies;Trained caregiver to feed patient ?Postural Changes and/or Swallow Maneuvers: Seated upright 90 degrees  ?   ?    ?   ? ? ? ? Oral Care Recommendations: Oral care QID;Oral care prior to ice chip/H20;Staff/trained caregiver to provide oral care ?Follow Up Recommendations: Skilled nursing-short term rehab (<3 hours/day) ?Assistance recommended at discharge: Frequent or constant Supervision/Assistance ?SLP Visit Diagnosis: Dysphagia, oropharyngeal phase (R13.12) ?Plan: Continue with current plan of care ? ? ? ? ?  ?  ? ? ?Sonia Baller, MA, CCC-SLP ?Speech Therapy ? ?

## 2021-07-01 NOTE — Plan of Care (Signed)
  Problem: Education: Goal: Knowledge of General Education information will improve Description: Including pain rating scale, medication(s)/side effects and non-pharmacologic comfort measures Outcome: Progressing   Problem: Health Behavior/Discharge Planning: Goal: Ability to manage health-related needs will improve Outcome: Progressing   Problem: Clinical Measurements: Goal: Ability to maintain clinical measurements within normal limits will improve Outcome: Progressing Goal: Will remain free from infection Outcome: Progressing Goal: Diagnostic test results will improve Outcome: Progressing Goal: Respiratory complications will improve Outcome: Progressing Goal: Cardiovascular complication will be avoided Outcome: Progressing   Problem: Activity: Goal: Risk for activity intolerance will decrease Outcome: Progressing   Problem: Nutrition: Goal: Adequate nutrition will be maintained Outcome: Progressing   Problem: Coping: Goal: Level of anxiety will decrease Outcome: Progressing   Problem: Elimination: Goal: Will not experience complications related to bowel motility Outcome: Progressing Goal: Will not experience complications related to urinary retention Outcome: Progressing   Problem: Pain Managment: Goal: General experience of comfort will improve Outcome: Progressing   Problem: Safety: Goal: Ability to remain free from injury will improve Outcome: Progressing   Problem: Skin Integrity: Goal: Risk for impaired skin integrity will decrease Outcome: Progressing   Problem: Education: Goal: Knowledge of disease or condition will improve Outcome: Progressing Goal: Knowledge of secondary prevention will improve (SELECT ALL) Outcome: Progressing Goal: Knowledge of patient specific risk factors will improve (INDIVIDUALIZE FOR PATIENT) Outcome: Progressing Goal: Individualized Educational Video(s) Outcome: Progressing   Problem: Coping: Goal: Will verbalize  positive feelings about self Outcome: Progressing   

## 2021-07-01 NOTE — Progress Notes (Addendum)
Nutrition Follow-up ? ?DOCUMENTATION CODES:  ?Non-severe (moderate) malnutrition in context of chronic illness ? ?INTERVENTION:  ?Re-start tube feeding as follows: ?Osmolite 1.5 at 57m and advance by 110mevery 4 hours until goal rate of 50 ml/h (1200 ml per day) ?Prosource TF 45 ml BID ?Free water flushes of 1003m4H per MD  ?Provides 1880 kcal, 97 gm protein, 1574m67mr day (TF+flush) ? ?NUTRITION DIAGNOSIS:  ?Moderate Malnutrition related to chronic illness (h/o stroke) as evidenced by mild fat depletion, moderate muscle depletion, mild muscle depletion. ?- ongoing ? ?GOAL:  ?Patient will meet greater than or equal to 90% of their needs ?- progressing, needs will be met with TF ? ?MONITOR:  ?PO intake, Supplement acceptance, Diet advancement, Labs, Weight trends, TF tolerance ? ?REASON FOR ASSESSMENT:  ?Consult ?Enteral/tube feeding initiation and management ? ?ASSESSMENT:  ?Pt admitted after a fall likely secondary to acute ischemic stroke. PMH significant for HTN, remote stroke with residual L-sided weakness, anxiety/depression and HLD. ? ?5/5 - MBS: SLP recommended NPO, cortrak placed (distal stomach) and TF initiated. ? ?Pt resting in bedside chair at the time of assessment napping. Noted that TF previously reached goal rate on 5/8 - but not infusing at this time and no TF formula in place. Discussed with RN, reports that feeds were not running this AM when her shift started, got in report that TF were being held for PEG placement. RN reports there was not an order for them to be held and after discussing with MD determined TF should be infusing, is in the process of restarting the feeds. ? ?PEG placement scheduled for Monday as pt has not made significant acute improvement in swallowing, will need long-term nutrition support while pt undergoes rehabilitation. ? ?Current TF Regimen: ?Osmolite 1.5 at 50mL65mith prosource TF BID - on hold since 5/8 per RN ? ?Nutritionally Relevant Medications: ?Scheduled  Meds: ? atorvastatin  40 mg Per Tube Daily  ? ezetimibe  10 mg Per Tube Daily  ? PROSource TF  45 mL Per Tube BID  ? free water  130 mL Per Tube Q4H  ? hydrochlorothiazide  25 mg Per Tube Daily  ? mouth rinse  15 mL Mouth Rinse BID  ? pantoprazole sodium  40 mg Per Tube Daily  ? ?Continuous Infusions: ? OSMOLITE 1.5 CAL 1,000 mL (06/29/21 0821)  ? vancomycin    ? ?PRN Meds: bisacodyl, magnesium hydroxide, senna-docusate ? ?Labs Reviewed: ?BUN 26 ? ?NUTRITION - FOCUSED PHYSICAL EXAM: ?Flowsheet Row Most Recent Value  ?Orbital Region No depletion  ?Upper Arm Region Mild depletion  ?Thoracic and Lumbar Region Mild depletion  ?Buccal Region Mild depletion  ?Temple Region No depletion  ?Clavicle Bone Region Mild depletion  ?Clavicle and Acromion Bone Region Mild depletion  ?Scapular Bone Region No depletion  ?Dorsal Hand No depletion  ?Patellar Region Moderate depletion  ?Anterior Thigh Region Moderate depletion  ?Posterior Calf Region Moderate depletion  ?Edema (RD Assessment) None  ?Hair Reviewed  ?Eyes Reviewed  ?Mouth Reviewed  ?Skin Reviewed  ?Nails Reviewed  ? ? ?Diet Order:   ?Diet Order   ? ?       ?  Diet NPO time specified  Diet effective now       ?  ? ?  ?  ? ?  ? ? ?EDUCATION NEEDS:  ?No education needs have been identified at this time ? ?Skin:  Skin Assessment: Reviewed RN Assessment ? ?Last BM:  5/6 ? ?Height:  ?Ht Readings from Last 1  Encounters:  ?06/24/21 _0  (1.6 m)  ? ?Weight:  ?Wt Readings from Last 1 Encounters:  ?07/01/21 60.4 kg  ? ? ?Ideal Body Weight:  52.3 kg ? ?BMI:  Body mass index is 23.59 kg/m?. ? ?Estimated Nutritional Needs:  ?Kcal:  1700-1900 ?Protein:  90-105g ?Fluid:  >/=1.7L ? ? ?Ranell Patrick, RD, LDN ?Clinical Dietitian ?RD pager # available in [place]  ?After hours/weekend pager # available in [place] ?

## 2021-07-01 NOTE — Progress Notes (Signed)
Physical Therapy Treatment ?[name] ?MRN: [id number] ?DOB: [birth year] ?Today's Date: 07/01/2021 ? ? ?History of Present Illness Pt is a 81 y/o female admitted secondary to R weakness and fall. MRI showed L thalamus infarct. PMH includes HTN and CVA. ? ?  ?PT Comments  ? ? Pt making slower progress toward goals, limited by slower processing.  Emphasis on transitions, transfer and gait safety with work on posture, proximity to the RW, speed and stability. ?   ?Recommendations for follow up therapy are one component of a multi-disciplinary discharge planning process, led by the attending physician.  Recommendations may be updated based on patient status, additional functional criteria and insurance authorization. ? ?Follow Up Recommendations ? Skilled nursing-short term rehab (<3 hours/day) ?  ?  ?Assistance Recommended at Discharge Frequent or constant Supervision/Assistance  ?Patient can return home with the following A lot of help with walking and/or transfers;A lot of help with bathing/dressing/bathroom;Assistance with cooking/housework;Direct supervision/assist for medications management;Direct supervision/assist for financial management;Assist for transportation;Help with stairs or ramp for entrance ?  ?Equipment Recommendations ? Other (comment) (TBD)  ?  ?Recommendations for Other Services   ? ? ?  ?Precautions / Restrictions Precautions ?Precautions: Fall ?Precaution Comments: watch BP & HR, syncopal episode 5/9 ?Restrictions ?Weight Bearing Restrictions: No  ?  ? ?Mobility ? Bed Mobility ?Overal bed mobility: Needs Assistance ?Bed Mobility: Supine to Sit ?  ?  ?Supine to sit: Mod assist, HOB elevated ?  ?  ?General bed mobility comments: Given time, pt will initiate coming up, and advancing LE's off the bed,  If not she needs higher levels of moderate assist. ?  ? ?Transfers ?Overall transfer level: Needs assistance ?Equipment used: Rolling walker (2 wheels) ?Transfers: Sit to/from  Stand ?Sit to Stand: Mod assist ?  ?  ?  ?  ?  ?General transfer comment: cues for hand placement and assist forward with boost, ?  ? ?Ambulation/Gait ?Ambulation/Gait assistance: Min assist ?Gait Distance (Feet): 80 Feet (with standing rest and th3 rest of the 70 feet.) ?Assistive device: Rolling walker (2 wheels) ?Gait Pattern/deviations: Step-through pattern, Decreased stride length, Decreased step length - right, Decreased step length - left ?Gait velocity: decreased ?Gait velocity interpretation: <1.31 ft/sec, indicative of household ambulator ?  ?General Gait Details: slow, shuffled steps , L worse than R LE.  Cues for posture and better proximity to the RW, worsening with fatigue.  Pt consistently drifting into objects, having trouble turning without assist.  Appearing to be a processing problem. ? ? ?Stairs ?  ?  ?  ?  ?  ? ? ?Wheelchair Mobility ?  ? ?Modified Rankin (Stroke Patients Only) ?Modified Rankin (Stroke Patients Only) ?Pre-Morbid Rankin Score: No significant disability ?Modified Rankin: Moderately severe disability ? ? ?  ?Balance Overall balance assessment: Needs assistance ?Sitting-balance support: No upper extremity supported, Feet supported ?Sitting balance-Leahy Scale: Fair ?  ?  ?Standing balance support: Bilateral upper extremity supported, During functional activity, Reliant on assistive device for balance ?Standing balance-Leahy Scale: Poor ?Standing balance comment: Reliant on BUE support ?  ?  ?  ?  ?  ?  ?  ?  ?  ?  ?  ?  ? ?  ?Cognition Arousal/Alertness: Awake/alert ?Behavior During Therapy: Flat affect ?Overall Cognitive Status: Impaired/Different from baseline ?  ?  ?  ?  ?  ?  ?  ?  ?  ?  ?  ?  ?  ?  ?  ?  ?General  Comments: Slow processing. Following simple commands with increased time. No verbalizations during session, will nod head " yes" and "no" to questions asked given enough time to respond; ?  ?  ? ?  ?Exercises Other Exercises ?Other Exercises: bil hip/knee flex/ext ROM  with graded resistance x10 ? ?  ?General Comments   ?  ?  ? ?Pertinent Vitals/Pain Pain Assessment ?Pain Assessment: Faces ?Faces Pain Scale: No hurt ?Pain Intervention(s): Monitored during session  ? ? ?Home Living   ?  ?  ?  ?  ?  ?  ?  ?  ?  ?   ?  ?Prior Function    ?  ?  ?   ? ?PT Goals (current goals can now be found in the care plan section) Acute Rehab PT Goals ?PT Goal Formulation: Patient unable to participate in goal setting ?Time For Goal Achievement: 07/09/21 ?Potential to Achieve Goals: Good ?Progress towards PT goals: Progressing toward goals ? ?  ?Frequency ? ? ? Min 3X/week ? ? ? ?  ?PT Plan Current plan remains appropriate  ? ? ?Co-evaluation   ?  ?  ?  ?  ? ?  ?AM-PAC PT "6 Clicks" Mobility   ?Outcome Measure ? Help needed turning from your back to your side while in a flat bed without using bedrails?: A Little ?Help needed moving from lying on your back to sitting on the side of a flat bed without using bedrails?: A Little ?Help needed moving to and from a bed to a chair (including a wheelchair)?: A Lot ?Help needed standing up from a chair using your arms (e.g., wheelchair or bedside chair)?: A Lot ?Help needed to walk in hospital room?: A Little ?Help needed climbing 3-5 steps with a railing? : Total ?6 Click Score: 14 ? ?  ?End of Session   ?Activity Tolerance: Patient tolerated treatment well ?Patient left: in chair;with call bell/phone within reach;with chair alarm set;with nursing/sitter in room ?Nurse Communication: Mobility status ?PT Visit Diagnosis: Other abnormalities of gait and mobility (R26.89);Difficulty in walking, not elsewhere classified (R26.2);Other symptoms and signs involving the nervous system (R29.898) ?  ? ? ?Time: [id number] ?PT Time Calculation (min) (ACUTE ONLY): 42 min ? ?Charges:  $Gait Training: 8-22 mins ?$Therapeutic Exercise: 8-22 mins ?$Therapeutic Activity: 8-22 mins          ?          ? ?07/01/2021 ? ?Ginger Carne., PT ?Acute Rehabilitation Services ?[phone]   (pager) ?[phone]  (office) ? ? ?Tessie Fass Taja [place] ?07/01/2021, 12:29 PM ? ?

## 2021-07-01 NOTE — Progress Notes (Signed)
? ?[name]  [id number] DOB: 20-Jan-1941 DOA: 06/24/2021 ?PCP: Ginger Organ., MD   ? ?Brief Narrative:  ?81 year old with a history of CVA and residual left hemiparesis who presented to the ER after a fall and was found to have right hemiparesis and a left gaze preference.  MRI brain in the ER revealed a small left thalamic stroke. ? ?Significant Events: ?5/3: Admitted and antihypertensives started ?5/5: Failed modified barium, Cortrak placed ?5/8: IR and Palliative care consulted for ?PEG ?  ?Consultants:  ?Neurology ?Palliative Care ?IR ? ?Code Status: FULL CODE ? ?DVT prophylaxis: ?Lovenox ? ?Interim Hx: ?Sitting up in a bedside chair working with speech therapy at the time of my evaluation.  Remains aphasic.  Appears comfortable.  Denies complaints with a head nod. ? ?Assessment & Plan: ? ?Acute ischemic stroke  ?MRI brain shows a small L thalamic stroke.   ?Non-invasive angiography showed severe distal left M1 stenosis, 3 x 4 mm ACOM aneurysm, and 50% stenosis of the proximal right ICA.  Echo without cardiogenic source of embolism ?- Continue aspirin, Brilinta for now ?- Continue Zetia, atorvastatin ?- Continue BP control ? ?Atrial fibrillation ?she has had a few very brief (1-2 seconds) runs SVT on monitoring > recommend 1 month cardiac event monitor at D/C ? ?Hypertensive emergency ?Initial BP >220/120 - BP now improved, 130s-160s. ?- Continue amlodipine, hydralazine, HCTZ, lisinopril, metoprolol ? ?Dysphagia ?Discussed with Neurology, in the setting of her prior lacunar infarcts, this stroke appears to be causing a more substantial bulbar deficit than this particular stroke otherwise would by itself.  The end result is that she has severe dysarthria and dysphagia, and it is possible she may never fully recover speech and swallowing ability - overall, however, she had a good functional status and cognitive status prior to her stroke, it is possible that these swallowing and speech deficits would be  isolated and she would have a good quality of life, making PEG reasonable - proceed with tentative planning for PEG after ticagrelor washout: ?- Consult IR ?- Hold Brilinta evening 5/10 ?- Hold aspirin 5/11 ?- Hold Lovenox 5/14 ?- Tentatively plan for PEG placement by IR on Monday 5/15 ? ?Malnutrition of moderate degree ?As evidenced by mild fat depletion, moderate muscle depletion and stroke with severe dysphagia ?- Consult dietitian ? ?Anxiety ?Family and nursing and I have some concern for depressive, apathetic reaction to this illness. ?- Continue home bupropion as short acting (prior med held given couldn't crush) ?- Continue escitalopram, dose increased here ? ?Family Communication:  ?Disposition: Awaiting PEG tube placement and IR plan for 5/15 then will require SNF placement ? ? ?Objective: ?Blood pressure 121/80, pulse 85, temperature 97.9 ?F (36.6 ?C), temperature source Oral, resp. rate 20, height 5\' 3"  (1.6 m), weight 60.4 kg, SpO2 97 %. ?No intake or output data in the 24 hours ending 07/01/21 1114 ?Filed Weights  ? 06/29/21 0430 06/30/21 0500 07/01/21 0500  ?Weight: 61.5 kg 65.1 kg 60.4 kg  ? ? ?Examination: ?General: No acute respiratory distress ?Lungs: Clear to auscultation bilaterally without wheezes or crackles ?Cardiovascular: Regular rate and rhythm without murmur gallop or rub normal S1 and S2 ?Abdomen: Nontender, nondistended, soft, bowel sounds positive, no rebound, no ascites, no appreciable mass ?Extremities: No significant cyanosis, clubbing, or edema bilateral lower extremities ? ?CBC: ?Recent Labs  ?Lab 06/24/21 ?1503 06/29/21 ?0235 07/01/21 ?0231  ?WBC 4.9 6.4 6.5  ?HGB 11.8* 13.5 13.4  ?HCT 39.0 42.2 42.7  ?MCV 85.3 81.3 81.3  ?  PLT 263 304 300  ? ?Basic Metabolic Panel: ?Recent Labs  ?Lab 06/26/21 ?0342 06/26/21 ?[id number] 06/27/21 ?[id number] 06/29/21 ?0235 07/01/21 ?0231  ?NA 137  --   --  136 136  ?K 4.1  --   --  3.8 3.6  ?CL 105  --   --  102 103  ?CO2 18*  --   --  25 25  ?GLUCOSE 80  --    --  117* 95  ?BUN 13  --   --  24* 26*  ?CREATININE 1.06*  --   --  0.91 0.89  ?CALCIUM 9.5  --   --  9.5 9.6  ?MG  --  2.1 2.1  --   --   ?PHOS  --  4.3 4.0  --   --   ? ?GFR: ?Estimated Creatinine Clearance: 43.1 mL/min (by C-G formula based on SCr of 0.89 mg/dL). ? ?Liver Function Tests: ?Recent Labs  ?Lab 06/24/21 ?1453 07/01/21 ?0231  ?AST 16 23  ?ALT 6 12  ?ALKPHOS 49 42  ?BILITOT 1.1 1.4*  ?PROT 6.9 6.9  ?ALBUMIN 4.0 3.9  ? ? ?Recent Labs  ?Lab 06/24/21 ?1503  ?AMMONIA 33  ? ? ?Coagulation Profile: ?Recent Labs  ?Lab 06/24/21 ?1503  ?INR 1.0  ? ? ?Cardiac Enzymes: ?Recent Labs  ?Lab 06/24/21 ?1503  ?CKTOTAL 78  ? ? ?HbA1C: ?Hgb A1c MFr Bld  ?Date/Time Value Ref Range Status  ?06/26/2021 03:42 AM 5.3 4.8 - 5.6 % Final  ?  Comment:  ?  (NOTE) ?Pre diabetes:          5.7%-6.4% ? ?Diabetes:              >6.4% ? ?Glycemic control for   <7.0% ?adults with diabetes ?  ?10/09/2019 03:11 AM 5.4 4.8 - 5.6 % Final  ?  Comment:  ?  (NOTE) ?Pre diabetes:          5.7%-6.4% ? ?Diabetes:              >6.4% ? ?Glycemic control for   <7.0% ?adults with diabetes ?  ? ? ?CBG: ?Recent Labs  ?Lab 06/30/21 ?1508 06/30/21 ?1935 06/30/21 ?2324 07/01/21 ?[id number] 07/01/21 ?[id number]  ?GLUCAP 115* 99 109* 102* 108*  ? ? ?Scheduled Meds: ? amLODipine  10 mg Per Tube Daily  ? aspirin  81 mg Per Tube Daily  ? atorvastatin  40 mg Per Tube Daily  ? buPROPion  75 mg Oral BID  ? enoxaparin (LOVENOX) injection  40 mg Subcutaneous Q24H  ? escitalopram  20 mg Per Tube Daily  ? ezetimibe  10 mg Per Tube Daily  ? feeding supplement (PROSource TF)  45 mL Per Tube BID  ? free water  130 mL Per Tube Q4H  ? hydrALAZINE  50 mg Per Tube Q8H  ? hydrochlorothiazide  25 mg Per Tube Daily  ? lisinopril  40 mg Per Tube Daily  ? mouth rinse  15 mL Mouth Rinse BID  ? metoprolol tartrate  50 mg Per Tube BID  ? pantoprazole sodium  40 mg Per Tube Daily  ? ?Continuous Infusions: ? feeding supplement (OSMOLITE 1.5 CAL) 1,000 mL (06/29/21 [id number])  ? [START ON 07/06/2021]  vancomycin    ? ? ? LOS: 7 days  ? ?Cherene Altes, MD ?[facility] ?Office  [phone] ?Pager - Text Page per Shea Evans ? ?If 7PM-7AM, please contact night-coverage per [place] ?07/01/2021, 11:14 AM ? ? ? ? ?

## 2021-07-01 NOTE — Progress Notes (Signed)
Afternoon Progress Note ?Pt sliding out of her recliner chair, but alert.  Non verbally confirming she wanted out of the chair. ? ? ? 07/01/21 1739  ?PT Visit Information  ?Last PT Received On 07/01/21  ?Assistance Needed +1  ?History of Present Illness Pt is a 81 y/o female admitted secondary to R weakness and fall. MRI showed L thalamus infarct. PMH includes HTN and CVA.  ?Subjective Data  ?Subjective non verbal  ?Patient Stated Goal unable to state  ?Precautions  ?Precautions Fall  ?Pain Assessment  ?Faces Pain Scale 0  ?Pain Intervention(s) Monitored during session  ?Cognition  ?Arousal/Alertness Awake/alert  ?Behavior During Therapy Flat affect  ?Overall Cognitive Status Impaired/Different from baseline ?(NT formally)  ?General Comments Slow processing. Following simple commands with increased time. No verbalizations during session, will nod head " yes" and "no" to questions asked given enough time to respond;  ?Difficult to assess due to Impaired communication  ?Bed Mobility  ?Overal bed mobility Needs Assistance  ?Bed Mobility Sit to Supine  ?Sit to supine Max assist;+2 for physical assistance  ?General bed mobility comments cued to lie down to her side, but she fell back need assist from behind.  One person assisted legs into bed.  ?Transfers  ?Overall transfer level Needs assistance  ?Equipment used None  ?Transfers Sit to/from Stand;Bed to chair/wheelchair/BSC  ?Sit to Stand Mod assist  ?Bed to/from chair/wheelchair/BSC transfer type: Stand pivot  ?Stand pivot transfers Mod assist  ?General transfer comment cues for hand placement and assist forward with boost,  face to face assist used  for stand pivot due to pt fatigued and slow to respond.  ?Modified Rankin (Stroke Patients Only)  ?Pre-Morbid Rankin Score 1  ?Modified Rankin 4  ?Balance  ?Overall balance assessment Needs assistance  ?Sitting-balance support No upper extremity supported;Feet supported  ?Sitting balance-Leahy Scale Fair  ?PT - End of  Session  ?Activity Tolerance Patient tolerated treatment well  ?Patient left in bed;with call bell/phone within reach;with bed alarm set;with family/visitor present  ?Nurse Communication Mobility status  ? PT - Assessment/Plan  ?PT Plan Current plan remains appropriate  ?PT Visit Diagnosis Other abnormalities of gait and mobility (R26.89);Difficulty in walking, not elsewhere classified (R26.2);Other symptoms and signs involving the nervous system (R29.898)  ?PT Frequency (ACUTE ONLY) Min 3X/week  ?Follow Up Recommendations Skilled nursing-short term rehab (<3 hours/day)  ?Assistance recommended at discharge Frequent or constant Supervision/Assistance  ?Patient can return home with the following A lot of help with walking and/or transfers;A lot of help with bathing/dressing/bathroom;Assistance with cooking/housework;Direct supervision/assist for medications management;Direct supervision/assist for financial management;Assist for transportation;Help with stairs or ramp for entrance  ?PT equipment Other (comment) ?(TBD)  ?AM-PAC PT "6 Clicks" Mobility Outcome Measure (Version 2)  ?Help needed turning from your back to your side while in a flat bed without using bedrails? 3  ?Help needed moving from lying on your back to sitting on the side of a flat bed without using bedrails? 2  ?Help needed moving to and from a bed to a chair (including a wheelchair)? 2  ?Help needed standing up from a chair using your arms (e.g., wheelchair or bedside chair)? 2  ?Help needed to walk in hospital room? 3  ?Help needed climbing 3-5 steps with a railing?  1  ?6 Click Score 13  ?Consider Recommendation of Discharge To: CIR/SNF/LTACH  ?Progressive Mobility  ?What is the highest level of mobility based on the progressive mobility assessment? Level 2 (Chairfast) - Balance while sitting on edge  of bed and cannot stand  ?Activity Transferred from chair to bed  ?PT Goal Progression  ?Progress towards PT goals Progressing toward goals  ?Acute  Rehab PT Goals  ?PT Goal Formulation Patient unable to participate in goal setting  ?Time For Goal Achievement 07/09/21  ?Potential to Achieve Goals Good  ?PT Time Calculation  ?PT Start Time (ACUTE ONLY) 1507  ?PT Stop Time (ACUTE ONLY) 1517  ?PT Time Calculation (min) (ACUTE ONLY) 10 min  ?PT General Charges  ?$$ ACUTE PT VISIT 1 Visit  ?PT Treatments  ?$Therapeutic Activity 8-22 mins  ? ?07/01/2021 ? ?Ginger Carne., PT ?Acute Rehabilitation Services ?[phone]  (pager) ?[phone]  (office) ?

## 2021-07-02 DIAGNOSIS — E440 Moderate protein-calorie malnutrition: Secondary | ICD-10-CM | POA: Diagnosis not present

## 2021-07-02 DIAGNOSIS — Z66 Do not resuscitate: Secondary | ICD-10-CM

## 2021-07-02 DIAGNOSIS — Z789 Other specified health status: Secondary | ICD-10-CM

## 2021-07-02 DIAGNOSIS — W19XXXA Unspecified fall, initial encounter: Secondary | ICD-10-CM | POA: Diagnosis not present

## 2021-07-02 DIAGNOSIS — I639 Cerebral infarction, unspecified: Secondary | ICD-10-CM | POA: Diagnosis not present

## 2021-07-02 DIAGNOSIS — R4182 Altered mental status, unspecified: Secondary | ICD-10-CM | POA: Diagnosis not present

## 2021-07-02 DIAGNOSIS — Z515 Encounter for palliative care: Secondary | ICD-10-CM

## 2021-07-02 LAB — GLUCOSE, CAPILLARY
Glucose-Capillary: 117 mg/dL — ABNORMAL HIGH (ref 70–99)
Glucose-Capillary: 124 mg/dL — ABNORMAL HIGH (ref 70–99)
Glucose-Capillary: 127 mg/dL — ABNORMAL HIGH (ref 70–99)
Glucose-Capillary: 130 mg/dL — ABNORMAL HIGH (ref 70–99)
Glucose-Capillary: 145 mg/dL — ABNORMAL HIGH (ref 70–99)

## 2021-07-02 MED ORDER — METOPROLOL TARTRATE 12.5 MG HALF TABLET
12.5000 mg | ORAL_TABLET | Freq: Two times a day (BID) | ORAL | Status: DC
  Administered 2021-07-02 – 2021-07-08 (×12): 12.5 mg
  Filled 2021-07-02 (×13): qty 1

## 2021-07-02 MED ORDER — ISOSORB DINITRATE-HYDRALAZINE 20-37.5 MG PO TABS
1.0000 | ORAL_TABLET | Freq: Three times a day (TID) | ORAL | Status: DC
  Administered 2021-07-02 – 2021-07-05 (×12): 1
  Filled 2021-07-02 (×12): qty 1

## 2021-07-02 NOTE — Progress Notes (Signed)
?                                                                                                                                                     ?                                                   ?Daily Progress Note  ? ?Patient Name: [name]       Date: 07/02/2021 ?DOB: [birth year]  Age: 81 y.o. MRN#: [id number] ?Attending Physician: Lonia Blood, MD ?Primary Care Physician: Cleatis Polka., MD ?Admit Date: 06/24/2021 ? ?Reason for Consultation/Follow-up: Establishing goals of care ? ?Subjective: ?Chart review performed.  Received report from primary RN -no acute concerns.  RN reports patient remains lethargic during most of the day, does not follow commands, is not verbal, is not eating and drinking.  Core track was initiated and RN reports patient is tolerating well.  Plan is for PEG tube placement on Monday.  ? ?Went to visit patient at bedside -no family/visitors present.  Patient is lying in bed awake, alert, nonverbal, unable to make complex medical decisions.  She is able to shake her head yes/no to simple questions.  She denies pain.  No signs or non-verbal gestures of pain or discomfort noted. No respiratory distress, increased work of breathing, or secretions noted.  Core track is in use and mitts in place. ? ?Called daughter/[name] -emotional support provided.  Reviewed information/discussion from previous PMT conversations.  Goals remain to pursue PEG tube placement on Monday and rehab following.  Discussed long-term goals to include possibility of needing long-term care versus returning home with her daughter.  [name] states patient would return home with her.  ? ?Outpatient palliative care explained and offered -daughter was agreeable. ? ?Encouraged patient/family to consider DNR/DNI status understanding evidenced based poor outcomes in similar hospitalized patient, as the cause of arrest is likely associated with advanced chronic/terminal illness rather than an easily reversible acute  cardio-pulmonary event. I explained that DNR/DNI does not change the medical plan and it only comes into effect after a person has arrested (died).  It is a protective measure to keep [place] from harming the patient in their last moments of life. [name] was agreeable to DNR/DNI with understanding that patient would not receive CPR, defibrillation, ACLS medications, or intubation.  [name] states patient's sister/[name] would also be agreeable. ? ?All questions and concerns addressed. Encouraged to call with questions and/or concerns. PMT card previously provided. ? ?Length of Stay: 8 ? ?Current Medications: ?Scheduled Meds:  ? amLODipine  10 mg Per Tube Daily  ? atorvastatin  40 mg Per Tube Daily  ? buPROPion  75  mg Per Tube BID  ? enoxaparin (LOVENOX) injection  40 mg Subcutaneous Q24H  ? escitalopram  20 mg Per Tube Daily  ? ezetimibe  10 mg Per Tube Daily  ? feeding supplement (PROSource TF)  45 mL Per Tube BID  ? free water  100 mL Per Tube Q4H  ? hydrochlorothiazide  25 mg Per Tube Daily  ? isosorbide-hydrALAZINE  1 tablet Per Tube TID  ? lisinopril  40 mg Per Tube Daily  ? mouth rinse  15 mL Mouth Rinse BID  ? metoprolol tartrate  12.5 mg Per Tube BID  ? pantoprazole sodium  40 mg Per Tube Daily  ? ? ?Continuous Infusions: ? feeding supplement (OSMOLITE 1.5 CAL) 1,000 mL (07/01/21 1238)  ? [START ON 07/06/2021] vancomycin    ? ? ?PRN Meds: ?[DISCONTINUED] acetaminophen **OR** acetaminophen (TYLENOL) oral liquid 160 mg/5 mL **OR** acetaminophen, bisacodyl, hydrALAZINE, magnesium hydroxide, senna-docusate ? ?Physical Exam ?Vitals and nursing note reviewed.  ?Constitutional:   ?   General: She is not in acute distress. ?   Appearance: She is ill-appearing.  ?Pulmonary:  ?   Effort: No respiratory distress.  ?Skin: ?   General: Skin is warm and dry.  ?Neurological:  ?   Mental Status: She is alert.  ?   Motor: Weakness present.  ?Psychiatric:     ?   Attention and Perception: Attention normal.     ?   Behavior: Behavior  is slowed.     ?   Cognition and Memory: Cognition is impaired. Memory is impaired.  ?   Comments: Nonverbal  ?         ? ?Vital Signs: BP (!) 149/62 (BP Location: Right Arm)   Pulse (!) 52   Temp 98.5 ?F (36.9 ?C)   Resp 18   Ht 5\' 3"  (1.6 m)   Wt 60.4 kg   SpO2 99%   BMI 23.59 kg/m?  ?SpO2: SpO2: 99 % ?O2 Device: O2 Device: Room Air ?O2 Flow Rate:   ? ?Intake/output summary:  ?Intake/Output Summary (Last 24 hours) at 07/02/2021 1413 ?Last data filed at 07/02/2021 0415 ?Gross per 24 hour  ?Intake --  ?Output 400 ml  ?Net -400 ml  ? ?LBM: Last BM Date : 06/29/21 ?Baseline Weight: Weight: 72.6 kg ?Most recent weight: Weight: 60.4 kg ? ?     ?Palliative Assessment/Data: PPS 40% with tube feeds ? ? ? ? ? ?Patient Active Problem List  ? Diagnosis Date Noted  ? Right sided weakness   ? Dysphagia 06/29/2021  ? Goals of care, counseling/discussion   ? Malnutrition of moderate degree 06/26/2021  ? Elevated lactic acid level 06/25/2021  ? Cerebrovascular disease 06/25/2021  ? Anxiety 06/25/2021  ? Encounter for general adult medical examination without abnormal findings 10/17/2020  ? Hardening of the aorta (main artery of the heart) (HCC) 10/17/2020  ? Hemiplegia of nondominant side as late effect of cerebrovascular disease (HCC) 10/17/2020  ? Mild cognitive impairment 10/17/2020  ? Osteopenia 10/17/2020  ? Plantar callosity 10/17/2020  ? Restrictive lung disease 10/17/2020  ? Rhabdomyolysis 03/01/2020  ? Fall 03/01/2020  ? Lab test positive for detection of COVID-19 virus 03/01/2020  ? Hypotension 03/01/2020  ? Elevated troponin 03/01/2020  ? Left leg weakness 03/01/2020  ? Leg edema, right 03/01/2020  ? Acute ischemic stroke (HCC) 10/08/2019  ? Status post left rotator cuff repair 02/13/2018  ? Complete rotator cuff tear of left shoulder 01/27/2018  ? S/P left rotator cuff repair 01/27/2018  ?  Mixed incontinence 04/30/2013  ? PVD (peripheral vascular disease) with claudication (HCC) 05/24/2012  ? Peripheral vascular  disease, unspecified (HCC) 11/24/2011  ? Neck pain 06/26/2010  ? Hyperlipidemia 03/03/2009  ? Impaired fasting glucose 03/03/2009  ? HYPERCHOLESTEROLEMIA 04/21/2006  ? OBESITY, NOS 04/21/2006  ? Major depressive disorder, recurrent episode (HCC) 04/21/2006  ? TOBACCO DEPENDENCE 04/21/2006  ? Hypertensive emergency 04/21/2006  ? HAND PAIN 04/21/2006  ? ? ?Palliative Care Assessment & Plan  ? ?Patient Profile: ?81 y.o. female with past medical history of  HTN, remote stroke with residual left-sided weakness, anxiety/depression, HLD admitted on 06/24/2021 with fall, significant right-sided weakness and left facial droop as well as left neglect. ?  ?Patient admitted with CVA, dysphagia, hypertensive emergency. PMT has been consulted to assist with medical decision making and goals of care regarding potential PEG placement. ? ?Assessment: ?Principal Problem: ?  Acute ischemic stroke (HCC) ?Active Problems: ?  Hypertensive emergency ?  Fall ?  Elevated lactic acid level ?  Cerebrovascular disease ?  Anxiety ?  Malnutrition of moderate degree ?  Dysphagia ?  Goals of care, counseling/discussion ?  Right sided weakness ?   ? ?Recommendations/Plan: ?Continue full scope care ?Now DNR/DNI - durable DNR form completed and placed in shadow chart. Copy was made and will be scanned into Vynca/ACP tab ?Goal is for discharge to rehab when medically stable after PEG placement Monday - long term goal is return home with daughter after rehab ?TOC consulted for: outpatient Palliative Care referral ?PMT will continue to follow peripherally. If there are any imminent needs please call the service directly ? ?Goals of Care and Additional Recommendations: ?Limitations on Scope of Treatment: Full Scope Treatment ? ?Code Status: ? ?  ?Code Status Orders  ?(From admission, onward)  ?  ? ? ?  ? ?  Start     Ordered  ? 07/02/21 1234  Do not attempt resuscitation (DNR)  Continuous       ?Question Answer Comment  ?In the event of cardiac or  respiratory ARREST Do not call a ?code blue?   ?In the event of cardiac or respiratory ARREST Do not perform Intubation, CPR, defibrillation or ACLS   ?In the event of cardiac or respiratory ARREST Use medication by a

## 2021-07-02 NOTE — Progress Notes (Addendum)
? ?[name]  [id number] DOB: 09/13/1940 DOA: 06/24/2021 ?PCP: Ginger Organ., MD   ? ?Brief Narrative:  ?81yo with a history of CVA and residual left hemiparesis who presented to the ER after a fall and was found to have right hemiparesis and a left gaze preference.  MRI brain in the ER revealed a small left thalamic stroke. ? ?Significant Events: ?5/3: Admitted and antihypertensives started ?5/5: Failed modified barium, Cortrak placed ?5/8: IR and Palliative care consulted for ?PEG ?  ?Consultants:  ?Neurology ?Palliative Care ?IR ? ?Code Status: NO CODE / DNR ? ?DVT prophylaxis: ?Lovenox ? ?Interim Hx: ?Patient has experienced some sinus bradycardia over the last 24 hours, necessitating discontinuation of her beta-blocker.  Vitals are otherwise stable and blood pressure is elevated despite bradycardia.  Resting comfortable in her hospital bed at the time of my visit.  No evidence of uncontrolled pain.  No distress. ? ?Assessment & Plan: ? ?Acute ischemic stroke  ?MRI brain shows a small L thalamic stroke.   ?Non-invasive angiography showed severe distal left M1 stenosis, 3 x 4 mm ACOM aneurysm, and 50% stenosis of the proximal right ICA.  Echo without cardiogenic source of embolism ?- Continue aspirin, Brilinta for now ?- Continue Zetia, atorvastatin ?- Continue BP control ? ?Atrial fibrillation ?she has had a few very brief (1-2 seconds) runs SVT on monitoring ? ?Hypertensive emergency ?Initial BP >220/120 - BP now improved, 130s-160s -beta-blocker discontinued due to bradycardia -initiating by DL ? ?Dysphagia ?Discussed with Neurology, in the setting of her prior lacunar infarcts, this stroke appears to be causing a more substantial bulbar deficit than this particular stroke otherwise would by itself.  The end result is that she has severe dysarthria and dysphagia, and it is possible she may never fully recover speech and swallowing ability - overall, however, she had a good functional status and  cognitive status prior to her stroke, it is possible that these swallowing and speech deficits would be isolated and she would have a good quality of life, making PEG reasonable - proceed with tentative planning for PEG after ticagrelor washout: ?- Hold Brilinta evening 5/10 ?- Hold aspirin 5/11 ?- Hold Lovenox 5/14 ?- Tentatively plan for PEG placement by IR on Monday 5/15 ? ?Malnutrition of moderate degree ?As evidenced by mild fat depletion, moderate muscle depletion and stroke with severe dysphagia -continue tube feeds and free water as recommended by nutrition consult ? ?Anxiety ?- Continue home bupropion as short acting (prior med held given couldn't crush) ?- Continue escitalopram, dose increased here ? ?Family Communication: No family present at time of exam ?Disposition: Awaiting PEG tube placement in IR - will require SNF placement after PEG in place ? ? ?Objective: ?Blood pressure 114/67, pulse (!) 51, temperature 97.8 ?F (36.6 ?C), temperature source Oral, resp. rate 18, height 5\' 3"  (1.6 m), weight 60.4 kg, SpO2 99 %. ? ?Intake/Output Summary (Last 24 hours) at 07/02/2021 0933 ?Last data filed at 07/02/2021 0415 ?Gross per 24 hour  ?Intake --  ?Output 800 ml  ?Net -800 ml  ? ?Filed Weights  ? 06/29/21 0430 06/30/21 0500 07/01/21 0500  ?Weight: 61.5 kg 65.1 kg 60.4 kg  ? ? ?Examination: ?General: No acute respiratory distress ?Lungs: CTA B with some upper airway sounds transmitted in lower air fields ?Cardiovascular: Bradycardic but regular without murmur ?Abdomen: NT/ND, soft, BS positive, no mass ?Extremities: No C/C/E BLE ? ?CBC: ?Recent Labs  ?Lab 06/29/21 ?0235 07/01/21 ?0231  ?WBC 6.4 6.5  ?HGB  13.5 13.4  ?HCT 42.2 42.7  ?MCV 81.3 81.3  ?PLT 304 300  ? ? ?Basic Metabolic Panel: ?Recent Labs  ?Lab 06/26/21 ?0342 06/26/21 ?[id number] 06/27/21 ?[id number] 06/29/21 ?0235 07/01/21 ?0231  ?NA 137  --   --  136 136  ?K 4.1  --   --  3.8 3.6  ?CL 105  --   --  102 103  ?CO2 18*  --   --  25 25  ?GLUCOSE 80  --   --   117* 95  ?BUN 13  --   --  24* 26*  ?CREATININE 1.06*  --   --  0.91 0.89  ?CALCIUM 9.5  --   --  9.5 9.6  ?MG  --  2.1 2.1  --   --   ?PHOS  --  4.3 4.0  --   --   ? ? ?GFR: ?Estimated Creatinine Clearance: 43.1 mL/min (by C-G formula based on SCr of 0.89 mg/dL). ? ?Liver Function Tests: ?Recent Labs  ?Lab 07/01/21 ?0231  ?AST 23  ?ALT 12  ?ALKPHOS 42  ?BILITOT 1.4*  ?PROT 6.9  ?ALBUMIN 3.9  ? ? ?Scheduled Meds: ? amLODipine  10 mg Per Tube Daily  ? atorvastatin  40 mg Per Tube Daily  ? buPROPion  75 mg Per Tube BID  ? enoxaparin (LOVENOX) injection  40 mg Subcutaneous Q24H  ? escitalopram  20 mg Per Tube Daily  ? ezetimibe  10 mg Per Tube Daily  ? feeding supplement (PROSource TF)  45 mL Per Tube BID  ? free water  100 mL Per Tube Q4H  ? hydrALAZINE  50 mg Per Tube Q8H  ? hydrochlorothiazide  25 mg Per Tube Daily  ? lisinopril  40 mg Per Tube Daily  ? mouth rinse  15 mL Mouth Rinse BID  ? metoprolol tartrate  12.5 mg Per Tube BID  ? pantoprazole sodium  40 mg Per Tube Daily  ? ?Continuous Infusions: ? feeding supplement (OSMOLITE 1.5 CAL) 1,000 mL (07/01/21 1238)  ? [START ON 07/06/2021] vancomycin    ? ? ? LOS: 8 days  ? ?Cherene Altes, MD ?[facility] ?Office  [phone] ?Pager - Text Page per Shea Evans ? ?If 7PM-7AM, please contact night-coverage per Amion ?07/02/2021, 9:33 AM ? ? ? ? ?

## 2021-07-02 NOTE — TOC Initial Note (Signed)
Transition of Care (TOC) - Initial/Assessment Note  ? ? ?Patient Details  ?Name: [name] ?MRN: [id number] ?Date of Birth: [birth year] ? ?Transition of Care [facility]) CM/SW Contact:    ?Geralynn Ochs, LCSW ?Phone Number: ?07/02/2021, 3:34 PM ? ?Clinical Narrative:        CSW attempted to reach daughter, Butch Penny, and left a voicemail. CSW received no call back, tried again, no answer. CSW called from hospital phone and daughter answered the phone. CSW explained self and role, made sure daughter had CSW work cell number. Daughter in agreement with SNF, preference for [facility]. CSW reached out to [facility] to ask them to review referral. CSW to follow.         ? ? ?Expected Discharge Plan: [place] ?Barriers to Discharge: Continued Medical Work up, Ship broker ? ? ?Patient Goals and CMS Choice ?Patient states their goals for this hospitalization and ongoing recovery are:: patient unable to participate in goal setting, not oriented ?CMS Medicare.gov Compare Post Acute Care list provided to:: Patient Represenative (must comment) ?Choice offered to / list presented to : Adult Children ? ?Expected Discharge Plan and Services ?Expected Discharge Plan: [place] ?  ?  ?Post Acute Care Choice: [place] ?Living arrangements for the past 2 months: [place] ?                ?  ?  ?  ?  ?  ?  ?  ?  ?  ?  ? ?Prior Living Arrangements/Services ?Living arrangements for the past 2 months: [place] ?Lives with:: Relatives ?Patient language and need for interpreter reviewed:: No ?Do you feel safe going back to the place where you live?: Yes      ?Need for Family Participation in Patient Care: Yes (Comment) ?Care giver support system in place?: No (comment) ?  ?Criminal Activity/Legal Involvement Pertinent to Current Situation/Hospitalization: No - Comment as needed ? ?Activities of Daily Living ?  ?  ? ?Permission Sought/Granted ?Permission sought to share  information with : Facility Sport and exercise psychologist, Family Supports ?Permission granted to share information with : Yes, Verbal Permission Granted ? Share Information with NAME: Butch Penny ? Permission granted to share info w AGENCY: SNF ? Permission granted to share info w Relationship: Daughter ?   ? ?Emotional Assessment ?  ?Attitude/Demeanor/Rapport: Unable to Assess ?Affect (typically observed): Unable to Assess ?Orientation: : Oriented to Self ?Alcohol / Substance Use: Not Applicable ?Psych Involvement: No (comment) ? ?Admission diagnosis:  Stroke (cerebrum) ([place]) [I63.9] ?Hypertensive emergency [I16.1] ?Right sided weakness [R53.1] ?Fall, initial encounter [W19.XXXA] ?Altered mental status, unspecified altered mental status type [R41.82] ?Patient Active Problem List  ? Diagnosis Date Noted  ? Right sided weakness   ? Dysphagia 06/29/2021  ? Goals of care, counseling/discussion   ? Malnutrition of moderate degree 06/26/2021  ? Elevated lactic acid level 06/25/2021  ? Cerebrovascular disease 06/25/2021  ? Anxiety 06/25/2021  ? Encounter for general adult medical examination without abnormal findings 10/17/2020  ? Hardening of the aorta (main artery of the heart) ([place]) 10/17/2020  ? Hemiplegia of nondominant side as late effect of cerebrovascular disease ([place]) 10/17/2020  ? Mild cognitive impairment 10/17/2020  ? Osteopenia 10/17/2020  ? Plantar callosity 10/17/2020  ? Restrictive lung disease 10/17/2020  ? Rhabdomyolysis 03/01/2020  ? Fall 03/01/2020  ? Lab test positive for detection of COVID-19 virus 03/01/2020  ? Hypotension 03/01/2020  ? Elevated troponin 03/01/2020  ? Left leg weakness 03/01/2020  ?  Leg edema, right 03/01/2020  ? Acute ischemic stroke ([place]) 10/08/2019  ? Status post left rotator cuff repair 02/13/2018  ? Complete rotator cuff tear of left shoulder 01/27/2018  ? S/P left rotator cuff repair 01/27/2018  ? Mixed incontinence 04/30/2013  ? PVD (peripheral vascular disease) with claudication ([place])  05/24/2012  ? Peripheral vascular disease, unspecified ([place]) 11/24/2011  ? Neck pain 06/26/2010  ? Hyperlipidemia 03/03/2009  ? Impaired fasting glucose 03/03/2009  ? HYPERCHOLESTEROLEMIA 04/21/2006  ? OBESITY, NOS 04/21/2006  ? Major depressive disorder, recurrent episode ([place]) 04/21/2006  ? TOBACCO DEPENDENCE 04/21/2006  ? Hypertensive emergency 04/21/2006  ? HAND PAIN 04/21/2006  ? ?PCP:  Ginger Organ., MD ?Pharmacy:   ?[id number] - [place], [place] - [address] ?[place] ?[place] [address] ?Phone: [phone] Fax: [phone] ? ? ? ? ?Social Determinants of Health (SDOH) Interventions ?  ? ?Readmission Risk Interventions ? ?  10/09/2019  ? 10:55 AM  ?Readmission Risk Prevention Plan  ?Post Dischage Appt Not Complete  ?Appt Comments rec for SNF  ?Medication Screening Complete  ?Transportation Screening Complete  ? ? ? ?

## 2021-07-03 ENCOUNTER — Inpatient Hospital Stay (HOSPITAL_COMMUNITY): Payer: Medicare HMO

## 2021-07-03 DIAGNOSIS — I639 Cerebral infarction, unspecified: Secondary | ICD-10-CM | POA: Diagnosis not present

## 2021-07-03 LAB — GLUCOSE, CAPILLARY
Glucose-Capillary: 121 mg/dL — ABNORMAL HIGH (ref 70–99)
Glucose-Capillary: 121 mg/dL — ABNORMAL HIGH (ref 70–99)
Glucose-Capillary: 126 mg/dL — ABNORMAL HIGH (ref 70–99)
Glucose-Capillary: 146 mg/dL — ABNORMAL HIGH (ref 70–99)
Glucose-Capillary: 131 mg/dL — ABNORMAL HIGH (ref 70–99)
Glucose-Capillary: 133 mg/dL — ABNORMAL HIGH (ref 70–99)

## 2021-07-03 NOTE — Progress Notes (Signed)
Occupational Therapy Treatment ?[name] ?MRN: [id number] ?DOB: Oct 17, 1940 ?Today's Date: 07/03/2021 ? ? ?History of present illness Pt is a 81 y/o female admitted secondary to R weakness and fall. MRI showed L thalamus infarct. PMH includes HTN and CVA. ?  ?OT comments ? Pt making good progress with OT goals. This session, pt was very motivated to work on EOB tasks, balance, and sit<>stands. She is requiring max initially, then mod A for stand pivot transfers. Sitting balance has improved from previous sessions, as pt is able to work on dynamic functional tasks EOB. Continuing to recommend SNF at this time for progressing to independence and OT will continue to follow.   ? ?Recommendations for follow up therapy are one component of a multi-disciplinary discharge planning process, led by the attending physician.  Recommendations may be updated based on patient status, additional functional criteria and insurance authorization. ?   ?Follow Up Recommendations ? Skilled nursing-short term rehab (<3 hours/day)  ?  ?Assistance Recommended at Discharge Frequent or constant Supervision/Assistance  ?Patient can return home with the following ? A lot of help with walking and/or transfers;A lot of help with bathing/dressing/bathroom;Assistance with cooking/housework;Direct supervision/assist for medications management;Assist for transportation;Direct supervision/assist for financial management;Help with stairs or ramp for entrance ?  ?Equipment Recommendations ? Other (comment) (Defer to next venue)  ?  ?Recommendations for Other Services   ? ?  ?Precautions / Restrictions Precautions ?Precautions: Fall;Other (comment) ?Precaution Comments: watch BP & HR, syncopal episode 5/9 ?Restrictions ?Weight Bearing Restrictions: No  ? ? ?  ? ?Mobility Bed Mobility ?Overal bed mobility: Needs Assistance ?Bed Mobility: Supine to Sit, Sit to Supine ?  ?  ?Supine to sit: Min assist, HOB elevated ?Sit to supine: Min  assist, HOB elevated ?  ?General bed mobility comments: increased time, cues for sequencing, +rail ?  ? ?Transfers ?Overall transfer level: Needs assistance ?Equipment used: Rolling walker (2 wheels) ?Transfers: Sit to/from Stand ?Sit to Stand: Mod assist ?Stand pivot transfers: Mod assist ?  ?  ?  ?  ?General transfer comment: cues for hand placement, assist to power up, increased time ?  ?  ?Balance Overall balance assessment: Needs assistance ?Sitting-balance support: No upper extremity supported, Feet supported ?Sitting balance-Leahy Scale: Fair ?Sitting balance - Comments: With dynamic reach tasks, pt able to maintain sitting balance with min guard ?  ?Standing balance support: Bilateral upper extremity supported, During functional activity, Reliant on assistive device for balance ?Standing balance-Leahy Scale: Poor ?Standing balance comment: Reliant on BUE support ?  ?  ?  ?  ?  ?  ?  ?  ?  ?  ?  ?   ? ?ADL either performed or assessed with clinical judgement  ? ?ADL Overall ADL's : Needs assistance/impaired ?  ?  ?  ?  ?  ?  ?  ?  ?  ?  ?  ?  ?Toilet Transfer: Maximal assistance;Stand-pivot ?Toilet Transfer Details (indicate cue type and reason): Pt worked on Oncologist x3 this session, improved witheach trial. ?  ?  ?  ?  ?  ?General ADL Comments: Session focused on sitting balance and sit<>stands/pivots ?  ? ?Extremity/Trunk Assessment   ?  ?  ?  ?  ?  ? ?Vision   ?  ?  ?Perception   ?  ?Praxis   ?  ? ?Cognition Arousal/Alertness: Awake/alert ?Behavior During Therapy: Flat affect ?Overall Cognitive Status: No family/caregiver present to determine baseline cognitive functioning ?  ?  ?  ?  ?  ?  ?  ?  ?  ?  ?  ?  ?  ?  ?  ?  ?  General Comments: Slow processing. Following simple commands with increased time. No verbalizations. Will nod yes/no. ?  ?  ?   ?Exercises Exercises: Other exercises ?Other Exercises ?Other Exercises: Reaching out and cross body in sitting ?Other Exercises: Reaching over head in  sitting ? ?  ?Shoulder Instructions   ? ? ?  ?General Comments VSS on RA  ? ? ?Pertinent Vitals/ Pain       Pain Assessment ?Pain Assessment: No/denies pain ? ?Home Living   ?  ?  ?  ?  ?  ?  ?  ?  ?  ?  ?  ?  ?  ?  ?  ?  ?  ?  ? ?  ?Prior Functioning/Environment    ?  ?  ?  ?   ? ?Frequency ? Min 2X/week  ? ? ? ? ?  ?Progress Toward Goals ? ?OT Goals(current goals can now be found in the care plan section) ? Progress towards OT goals: Progressing toward goals ? ?Acute Rehab OT Goals ?Patient Stated Goal: none stated ?OT Goal Formulation: With patient ?Time For Goal Achievement: 07/09/21 ?Potential to Achieve Goals: Good ?ADL Goals ?Pt Will Perform Grooming: with modified independence;standing ?Pt Will Perform Lower Body Bathing: with supervision;sitting/lateral leans;sit to/from stand ?Pt Will Perform Lower Body Dressing: with supervision;sitting/lateral leans;sit to/from stand ?Pt Will Transfer to Toilet: with supervision;ambulating ?Pt Will Perform Toileting - Clothing Manipulation and hygiene: with supervision;sitting/lateral leans;sit to/from stand  ?Plan Frequency remains appropriate;Discharge plan remains appropriate   ? ?Co-evaluation ? ? ?   ?  ?  ?  ?  ? ?  ?AM-PAC OT "6 Clicks" Daily Activity     ?Outcome Measure ? ? Help from another person eating meals?: A Lot ?Help from another person taking care of personal grooming?: A Lot ?Help from another person toileting, which includes using toliet, bedpan, or urinal?: A Lot ?Help from another person bathing (including washing, rinsing, drying)?: A Lot ?Help from another person to put on and taking off regular upper body clothing?: A Lot ?Help from another person to put on and taking off regular lower body clothing?: Total ?6 Click Score: 11 ? ?  ?End of Session Equipment Utilized During Treatment: Gait belt;Rolling walker (2 wheels) ? ?OT Visit Diagnosis: Unsteadiness on feet (R26.81);Other abnormalities of gait and mobility (R26.89);Muscle weakness  (generalized) (M62.81) ?  ?Activity Tolerance Patient tolerated treatment well ?  ?Patient Left in bed;with call bell/phone within reach;with bed alarm set ?  ?Nurse Communication Mobility status ?  ? ?   ? ?Time: 0814-4818 ?OT Time Calculation (min): 17 min ? ?Charges: OT General Charges ?$OT Visit: 1 Visit ?OT Treatments ?$Therapeutic Activity: 8-22 mins ? ?Josafat Enrico H., OTR/L ?Acute Rehabilitation ? ?Latrena Benegas Elane Bing Plume ?07/03/2021, 6:05 PM ?

## 2021-07-03 NOTE — Progress Notes (Signed)
Physical Therapy Treatment ?[name] ?MRN: [id number] ?DOB: 10/30/1940 ?Today's Date: 07/03/2021 ? ? ?History of Present Illness Pt is a 81 y/o female admitted secondary to R weakness and fall. MRI showed L thalamus infarct. PMH includes HTN and CVA. ? ?  ?PT Comments  ? ? Pt required min assist bed mobility, mod assist sit to stand, and min assist amb 80' with RW. Continues to demonstrate slow processing but following simple commands consistently with increased time. VSS on RA. Returned to bed at end of session in preparation for MBS. ?   ?Recommendations for follow up therapy are one component of a multi-disciplinary discharge planning process, led by the attending physician.  Recommendations may be updated based on patient status, additional functional criteria and insurance authorization. ? ?Follow Up Recommendations ? Skilled nursing-short term rehab (<3 hours/day) ?  ?  ?Assistance Recommended at Discharge Frequent or constant Supervision/Assistance  ?Patient can return home with the following A lot of help with walking and/or transfers;A lot of help with bathing/dressing/bathroom;Assistance with cooking/housework;Direct supervision/assist for medications management;Direct supervision/assist for financial management;Assist for transportation;Help with stairs or ramp for entrance ?  ?Equipment Recommendations ? Rolling walker (2 wheels)  ?  ?Recommendations for Other Services   ? ? ?  ?Precautions / Restrictions Precautions ?Precautions: Fall;Other (comment) ?Precaution Comments: watch BP & HR, syncopal episode 5/9 ?Restrictions ?Weight Bearing Restrictions: No  ?  ? ?Mobility ? Bed Mobility ?Overal bed mobility: Needs Assistance ?Bed Mobility: Supine to Sit, Sit to Supine ?  ?  ?Supine to sit: Min assist, HOB elevated ?Sit to supine: Min assist, HOB elevated ?  ?General bed mobility comments: increased time, cues for sequencing, +rail ?  ? ?Transfers ?Overall transfer level: Needs  assistance ?Equipment used: Rolling walker (2 wheels) ?Transfers: Sit to/from Stand ?Sit to Stand: Mod assist ?  ?  ?  ?  ?  ?General transfer comment: cues for hand placement, assist to power up, increased time ?  ? ?Ambulation/Gait ?Ambulation/Gait assistance: Min assist ?Gait Distance (Feet): 80 Feet ?Assistive device: Rolling walker (2 wheels) ?Gait Pattern/deviations: Step-through pattern, Decreased stride length, Narrow base of support, Shuffle ?Gait velocity: decreased ?Gait velocity interpretation: <1.8 ft/sec, indicate of risk for recurrent falls ?  ?General Gait Details: slow, shuffle steps. Able to improve foot clearance with verbal cues. Fatigues quickly. Assist with RW management, balance, and lateral weight shifts. ? ? ?Stairs ?  ?  ?  ?  ?  ? ? ?Wheelchair Mobility ?  ? ?Modified Rankin (Stroke Patients Only) ?Modified Rankin (Stroke Patients Only) ?Pre-Morbid Rankin Score: No significant disability ?Modified Rankin: Moderately severe disability ? ? ?  ?Balance Overall balance assessment: Needs assistance ?Sitting-balance support: No upper extremity supported, Feet supported ?Sitting balance-Leahy Scale: Fair ?  ?  ?Standing balance support: Bilateral upper extremity supported, During functional activity, Reliant on assistive device for balance ?Standing balance-Leahy Scale: Poor ?  ?  ?  ?  ?  ?  ?  ?  ?  ?  ?  ?  ?  ? ?  ?Cognition Arousal/Alertness: Awake/alert ?Behavior During Therapy: Flat affect ?Overall Cognitive Status: No family/caregiver present to determine baseline cognitive functioning ?  ?  ?  ?  ?  ?  ?  ?  ?  ?  ?  ?  ?  ?  ?  ?  ?General Comments: Slow processing. Following simple commands with increased time. No verbalizations. Will nod yes/no. ?  ?  ? ?  ?  Exercises   ? ?  ?General Comments   ?  ?  ? ?Pertinent Vitals/Pain Pain Assessment ?Pain Assessment: Faces ?Pain Score: 0-No pain  ? ? ?Home Living   ?  ?  ?  ?  ?  ?  ?  ?  ?  ?   ?  ?Prior Function    ?  ?  ?   ? ?PT Goals  (current goals can now be found in the care plan section) Acute Rehab PT Goals ?Patient Stated Goal: unable to state ?Progress towards PT goals: Progressing toward goals ? ?  ?Frequency ? ? ? Min 3X/week ? ? ? ?  ?PT Plan Current plan remains appropriate  ? ? ?Co-evaluation   ?  ?  ?  ?  ? ?  ?AM-PAC PT "6 Clicks" Mobility   ?Outcome Measure ? Help needed turning from your back to your side while in a flat bed without using bedrails?: A Little ?Help needed moving from lying on your back to sitting on the side of a flat bed without using bedrails?: A Little ?Help needed moving to and from a bed to a chair (including a wheelchair)?: A Lot ?Help needed standing up from a chair using your arms (e.g., wheelchair or bedside chair)?: A Lot ?Help needed to walk in hospital room?: A Little ?Help needed climbing 3-5 steps with a railing? : Total ?6 Click Score: 14 ? ?  ?End of Session Equipment Utilized During Treatment: Gait belt ?Activity Tolerance: Patient tolerated treatment well ?Patient left: in bed;with call bell/phone within reach;with bed alarm set ?Nurse Communication: Mobility status ?PT Visit Diagnosis: Other abnormalities of gait and mobility (R26.89);Difficulty in walking, not elsewhere classified (R26.2);Other symptoms and signs involving the nervous system (R29.898) ?  ? ? ?Time: [id number] ?PT Time Calculation (min) (ACUTE ONLY): 25 min ? ?Charges:  $Gait Training: 23-37 mins          ?          ? ?Lorrin Goodell, PT  ?Office # [phone] ?Pager [phone] ? ? ? ?[name] ?07/03/2021, 12:10 PM ? ?

## 2021-07-03 NOTE — Progress Notes (Signed)
Speech Language Pathology Treatment: Dysphagia;Cognitive-Linquistic  ?[name] ?MRN: [id number] ?DOB: 13-Aug-1940 ?Today's Date: 07/03/2021 ?Time: [id number] ?SLP Time Calculation (min) (ACUTE ONLY): 26 min ? ?Assessment / Plan / Recommendation ?Clinical Impression ? [name] has improved from previous session 5/10. Mitts removed and able to initiate brushing tongue independently with cues only needed for precision and continue moving brush. She held cup of honey thick grape juice with assist to reach lips and held applesauce in left hand but unable to grasp spoon although she attempted. Mild anterior spillage with puree and moderate volume with thickened juice and lingual pumping with suspected mildly late onset of swallow. She initiated 5 swallows throughout session without cough or throat clearing present from subjective measure. Improvements observed today and she is appropriate for repeat MBS today at 1330 for possible and hopeful po initiation. She is in a open mouth lingual protrusion most of the time with drooling and can close mouth on command but difficulty maintaining distracted by other task/information.  ? ?[name] responded appropriately with head nod/shaked to simple/biographic questions with 100% accuracy. Followed simple commands during oral care and eating appropriately needing minimal and only intermittent verbal cues with extra time to process information. Counted 1-5 with help to initiate with significant dysarthria and approximations for numbers 2-5. She attempted to say name but output was imprecise.  ?  ?HPI HPI: 81 y.o. female with medical history significant of HTN, remote stroke with residual left-sided weakness, anxiety/depression, HLD, sent by daughter for fall this afternoon.      Last known normal was around 10 AM today.  Daughter found patient on the floor this afternoon around 1 PM.  Patient obviously unable to talk but EMS arrived, EMS interviewed the  daughter on scene but found little additional information.  EMS also found the patient's pill bottles not organized and some of her BP meds pill boxes full with expiration date already passed; MRI head on 06/25/21 indicated small acute infarct lateral L thalamus; old occipital infarct.  10/09/19 MBS while inpatient revealed significant oral dysphagia. ?  ?   ?SLP Plan ? MBS ? ?  ?  ?Recommendations for follow up therapy are one component of a multi-disciplinary discharge planning process, led by the attending physician.  Recommendations may be updated based on patient status, additional functional criteria and insurance authorization. ?  ? ?Recommendations  ?Diet recommendations: NPO ?Medication Administration: Via alternative means  ?   ?    ?   ? ? ? ? Oral Care Recommendations: Oral care QID ?Follow Up Recommendations: Skilled nursing-short term rehab (<3 hours/day) ?Assistance recommended at discharge: Frequent or constant Supervision/Assistance ?SLP Visit Diagnosis: Dysphagia, oropharyngeal phase (R13.12);Cognitive communication deficit (R41.841) ?Plan: MBS ? ? ? ? ?  ?  ? ? ?[name] ? ?07/03/2021, 11:09 AM ?

## 2021-07-03 NOTE — Progress Notes (Signed)
? ?[name]  [id number] DOB: 03-30-1940 DOA: 06/24/2021 ?PCP: Ginger Organ., MD   ? ?Brief Narrative:  ?81yo with a history of CVA and residual left hemiparesis who presented to the ER after a fall and was found to have right hemiparesis and a left gaze preference.  MRI brain in the ER revealed a small left thalamic stroke. ? ?Significant Events: ?5/3: Admitted and antihypertensives started ?5/5: Failed modified barium, Cortrak placed ?5/8: IR and Palliative care consulted for ?PEG ?  ?Consultants:  ?Neurology ?Palliative Care ?IR ? ?Code Status: NO CODE / DNR ? ?DVT prophylaxis: ?Lovenox ? ?Interim Hx: ?Heart rate stable off beta-blocker with range of 56-79.  Blood pressure controlled.  Afebrile.  Resting comfortably in bed at time of my visit.  Is able to communicate using head nods but does not speak.  Denies pain.  Denies any new complaints. ? ?Assessment & Plan: ? ?Acute ischemic stroke  ?MRI brain shows a small L thalamic stroke.   ?Non-invasive angiography showed severe distal left M1 stenosis, 3 x 4 mm ACOM aneurysm, and 50% stenosis of the proximal right ICA.  Echo without cardiogenic source of embolism ?- Continue aspirin, Brilinta for now ?- Continue Zetia, atorvastatin ?- Continue BP control ? ?Atrial fibrillation ?she has had a few very brief (1-2 seconds) runs SVT on monitoring - do not feel full anticoag in her best interest/clearly indicated at this time ? ?Hypertensive emergency ?Initial BP >220/120 - BP now improved, 130s-160s -beta-blocker discontinued due to bradycardia -blood pressure presently well controlled ? ?Dysphagia ?Discussed with Neurology, in the setting of her prior lacunar infarcts, this stroke appears to be causing a more substantial bulbar deficit than this particular stroke otherwise would by itself.  The end result is that she has severe dysarthria and dysphagia, and it is possible she may never fully recover speech and swallowing ability - overall, however, she had a  good functional status and cognitive status prior to her stroke, it is possible that these swallowing and speech deficits would be isolated and she would have a good quality of life, making PEG reasonable - proceed with tentative planning for PEG after ticagrelor washout: ?- Hold Brilinta evening 5/10 ?- Hold aspirin 5/11 ?- Hold Lovenox 5/14 ?- plan for PEG placement by IR on Monday 5/15 ? ?Malnutrition of moderate degree ?As evidenced by mild fat depletion, moderate muscle depletion and stroke with severe dysphagia -continue tube feeds and free water as recommended by nutrition consult ? ?Anxiety ?- Continue home bupropion as short acting (prior med held given couldn't crush) ?- Continue escitalopram, dose increased here ? ?Family Communication: No family present at time of exam ?Disposition: Awaiting PEG tube placement in IR - will require SNF placement after PEG in place ? ? ?Objective: ?Blood pressure (!) 102/57, pulse 71, temperature 98.4 ?F (36.9 ?C), temperature source Oral, resp. rate 20, height 5\' 3"  (1.6 m), weight 60.4 kg, SpO2 100 %. ? ?Intake/Output Summary (Last 24 hours) at 07/03/2021 0807 ?Last data filed at 07/03/2021 0349 ?Gross per 24 hour  ?Intake --  ?Output 80 ml  ?Net -80 ml  ? ? ?Filed Weights  ? 06/29/21 0430 06/30/21 0500 07/01/21 0500  ?Weight: 61.5 kg 65.1 kg 60.4 kg  ? ? ?Examination: ?General: No acute respiratory distress ?Lungs: CTA B - no wheezing  ?Cardiovascular: RRR ?Abdomen: NT/ND, soft, BS positive, no mass ?Extremities: No C/C/E B LE ? ?CBC: ?Recent Labs  ?Lab 06/29/21 ?0235 07/01/21 ?0231  ?WBC 6.4 6.5  ?  HGB 13.5 13.4  ?HCT 42.2 42.7  ?MCV 81.3 81.3  ?PLT 304 300  ? ? ?Basic Metabolic Panel: ?Recent Labs  ?Lab 06/26/21 ?[id number] 06/27/21 ?[id number] 06/29/21 ?0235 07/01/21 ?0231  ?NA  --   --  136 136  ?K  --   --  3.8 3.6  ?CL  --   --  102 103  ?CO2  --   --  25 25  ?GLUCOSE  --   --  117* 95  ?BUN  --   --  24* 26*  ?CREATININE  --   --  0.91 0.89  ?CALCIUM  --   --  9.5 9.6  ?MG 2.1  2.1  --   --   ?PHOS 4.3 4.0  --   --   ? ? ?GFR: ?Estimated Creatinine Clearance: 43.1 mL/min (by C-G formula based on SCr of 0.89 mg/dL). ? ?Liver Function Tests: ?Recent Labs  ?Lab 07/01/21 ?0231  ?AST 23  ?ALT 12  ?ALKPHOS 42  ?BILITOT 1.4*  ?PROT 6.9  ?ALBUMIN 3.9  ? ? ?Scheduled Meds: ? amLODipine  10 mg Per Tube Daily  ? atorvastatin  40 mg Per Tube Daily  ? buPROPion  75 mg Per Tube BID  ? enoxaparin (LOVENOX) injection  40 mg Subcutaneous Q24H  ? escitalopram  20 mg Per Tube Daily  ? ezetimibe  10 mg Per Tube Daily  ? feeding supplement (PROSource TF)  45 mL Per Tube BID  ? free water  100 mL Per Tube Q4H  ? hydrochlorothiazide  25 mg Per Tube Daily  ? isosorbide-hydrALAZINE  1 tablet Per Tube TID  ? lisinopril  40 mg Per Tube Daily  ? mouth rinse  15 mL Mouth Rinse BID  ? metoprolol tartrate  12.5 mg Per Tube BID  ? pantoprazole sodium  40 mg Per Tube Daily  ? ?Continuous Infusions: ? feeding supplement (OSMOLITE 1.5 CAL) 1,000 mL (07/02/21 2216)  ? [START ON 07/06/2021] vancomycin    ? ? ? LOS: 9 days  ? ?Cherene Altes, MD ?[facility] ?Office  [phone] ?Pager - Text Page per Shea Evans ? ?If 7PM-7AM, please contact night-coverage per Amion ?07/03/2021, 8:07 AM ? ? ? ? ?

## 2021-07-04 DIAGNOSIS — I639 Cerebral infarction, unspecified: Secondary | ICD-10-CM | POA: Diagnosis not present

## 2021-07-04 LAB — GLUCOSE, CAPILLARY
Glucose-Capillary: 108 mg/dL — ABNORMAL HIGH (ref 70–99)
Glucose-Capillary: 108 mg/dL — ABNORMAL HIGH (ref 70–99)
Glucose-Capillary: 118 mg/dL — ABNORMAL HIGH (ref 70–99)
Glucose-Capillary: 111 mg/dL — ABNORMAL HIGH (ref 70–99)
Glucose-Capillary: 122 mg/dL — ABNORMAL HIGH (ref 70–99)
Glucose-Capillary: 128 mg/dL — ABNORMAL HIGH (ref 70–99)

## 2021-07-04 LAB — MAGNESIUM: Magnesium: 2.2 mg/dL (ref 1.7–2.4)

## 2021-07-04 LAB — BASIC METABOLIC PANEL
Anion gap: 7 (ref 5–15)
BUN: 37 mg/dL — ABNORMAL HIGH (ref 8–23)
CO2: 27 mmol/L (ref 22–32)
Calcium: 9.5 mg/dL (ref 8.9–10.3)
Chloride: 101 mmol/L (ref 98–111)
Creatinine, Ser: 1.08 mg/dL — ABNORMAL HIGH (ref 0.44–1.00)
GFR, Estimated: 53 mL/min — ABNORMAL LOW (ref >60)
Glucose, Bld: 98 mg/dL (ref 70–99)
Potassium: 3.9 mmol/L (ref 3.5–5.1)
Sodium: 135 mmol/L (ref 135–145)

## 2021-07-04 LAB — PHOSPHORUS: Phosphorus: 5 mg/dL — ABNORMAL HIGH (ref 2.5–4.6)

## 2021-07-04 LAB — CBC
HCT: 35.9 % — ABNORMAL LOW (ref 36.0–46.0)
Hemoglobin: 11.1 g/dL — ABNORMAL LOW (ref 12.0–15.0)
MCH: 25.5 pg — ABNORMAL LOW (ref 26.0–34.0)
MCHC: 30.9 g/dL (ref 30.0–36.0)
MCV: 82.3 fL (ref 80.0–100.0)
Platelets: 325 10*3/uL (ref 150–400)
RBC: 4.36 MIL/uL (ref 3.87–5.11)
RDW: 14.1 % (ref 11.5–15.5)
WBC: 6.8 10*3/uL (ref 4.0–10.5)
nRBC: 0 % (ref 0.0–0.2)

## 2021-07-04 MED ORDER — FREE WATER
200.0000 mL | Status: DC
  Administered 2021-07-04 – 2021-07-08 (×19): 200 mL

## 2021-07-04 NOTE — Progress Notes (Signed)
? ?[name]  [id number] DOB: 1940-03-04 DOA: 06/24/2021 ?PCP: Ginger Organ., MD   ? ?Brief Narrative:  ?81yo with a history of CVA and residual left hemiparesis who presented to the ER after a fall and was found to have right hemiparesis and a left gaze preference.  MRI brain in the ER revealed a small left thalamic stroke. ? ?Significant Events: ?5/3: Admitted and antihypertensives started ?5/5: Failed modified barium, Cortrak placed ?5/8: IR and Palliative care consulted for ?PEG ?  ?Consultants:  ?Neurology ?Palliative Care ?IR ? ?Code Status: NO CODE / DNR ? ?DVT prophylaxis: ?Lovenox ? ?Interim Hx: ?No acute events reported overnight.  Actually showed some evidence of improvement with SLP eval yesterday, and is therefore being allowed small volumes of thickened liquids by mouth with assistance.  Is alert and interactive at time of visit.  No evidence of uncontrolled pain.  Denies new complaints with head nod. ? ?Assessment & Plan: ? ?Acute ischemic stroke  ?MRI brain noted a small L thalamic stroke ?Non-invasive angiography noted severe distal left M1 stenosis, 3 x 4 mm ACOM aneurysm, and 50% stenosis of the proximal right ICA ?TTE without cardiogenic source of embolism ?- Continue aspirin, Brilinta after PEG tube placed ?- Continue Zetia, atorvastatin ?- Continue BP control ? ?Atrial fibrillation ?she has had a few very brief (1-2 seconds) runs of SVT on monitoring - do not feel full anticoag in her best interest/clearly indicated at this time ? ?Hypertensive emergency ?Initial BP >220/120 - beta-blocker discontinued due to bradycardia -blood pressure presently well controlled ? ?Dysphagia ?Discussed with Neurology, in the setting of her prior lacunar infarcts, this stroke appears to be causing a more substantial bulbar deficit than this particular stroke otherwise would by itself - the end result is that she has severe dysarthria and dysphagia, and it is possible she may never fully recover speech  and swallowing ability - overall, however, she had a good functional status and cognitive status prior to her stroke, it is possible that these swallowing and speech deficits would be isolated and she would have a good quality of life, making PEG reasonable - proceed with planning for PEG after ticagrelor washout: ?- Hold Brilinta evening 5/10 ?- Hold aspirin 5/11 ?- Hold Lovenox 5/14 ?- plan for PEG placement by IR on Monday 5/15 ? ?Malnutrition of moderate degree ?As evidenced by mild fat depletion, moderate muscle depletion and stroke with severe dysphagia -continue tube feeds and free water as recommended by nutrition consult ? ?Anxiety ?- Continue home bupropion as short acting (prior med held given couldn't crush) ?- Continue escitalopram, dose increased here ? ?Family Communication: No family present at time of exam ?Disposition: Awaiting PEG tube placement in IR - will require SNF placement after PEG in place ? ? ?Objective: ?Blood pressure 130/69, pulse 70, temperature 98.6 ?F (37 ?C), temperature source Oral, resp. rate (!) 8, height 5\' 3"  (1.6 m), weight 60.4 kg, SpO2 99 %. ? ?Intake/Output Summary (Last 24 hours) at 07/04/2021 0819 ?Last data filed at 07/03/2021 1940 ?Gross per 24 hour  ?Intake --  ?Output 300 ml  ?Net -300 ml  ? ? ?Filed Weights  ? 06/29/21 0430 06/30/21 0500 07/01/21 0500  ?Weight: 61.5 kg 65.1 kg 60.4 kg  ? ? ?Examination: ?General: No acute respiratory distress ?Lungs: CTA B ?Cardiovascular: RRR ?Abdomen: NT/ND, soft, BS positive, no mass ?Extremities: No C/C/E B lower extremities ? ?CBC: ?Recent Labs  ?Lab 06/29/21 ?0235 07/01/21 ?0231 07/04/21 ?[id number]  ?WBC 6.4  6.5 6.8  ?HGB 13.5 13.4 11.1*  ?HCT 42.2 42.7 35.9*  ?MCV 81.3 81.3 82.3  ?PLT 304 300 325  ? ? ?Basic Metabolic Panel: ?Recent Labs  ?Lab 06/29/21 ?0235 07/01/21 ?0231 07/04/21 ?[id number]  ?NA 136 136 135  ?K 3.8 3.6 3.9  ?CL 102 103 101  ?CO2 25 25 27   ?GLUCOSE 117* 95 98  ?BUN 24* 26* 37*  ?CREATININE 0.91 0.89 1.08*  ?CALCIUM 9.5  9.6 9.5  ?MG  --   --  2.2  ?PHOS  --   --  5.0*  ? ? ?GFR: ?Estimated Creatinine Clearance: 35.5 mL/min (A) (by C-G formula based on SCr of 1.08 mg/dL (H)). ? ?Liver Function Tests: ?Recent Labs  ?Lab 07/01/21 ?0231  ?AST 23  ?ALT 12  ?ALKPHOS 42  ?BILITOT 1.4*  ?PROT 6.9  ?ALBUMIN 3.9  ? ? ?Scheduled Meds: ? amLODipine  10 mg Per Tube Daily  ? atorvastatin  40 mg Per Tube Daily  ? buPROPion  75 mg Per Tube BID  ? enoxaparin (LOVENOX) injection  40 mg Subcutaneous Q24H  ? escitalopram  20 mg Per Tube Daily  ? ezetimibe  10 mg Per Tube Daily  ? feeding supplement (PROSource TF)  45 mL Per Tube BID  ? free water  100 mL Per Tube Q4H  ? hydrochlorothiazide  25 mg Per Tube Daily  ? isosorbide-hydrALAZINE  1 tablet Per Tube TID  ? lisinopril  40 mg Per Tube Daily  ? mouth rinse  15 mL Mouth Rinse BID  ? metoprolol tartrate  12.5 mg Per Tube BID  ? pantoprazole sodium  40 mg Per Tube Daily  ? ?Continuous Infusions: ? feeding supplement (OSMOLITE 1.5 CAL) 1,000 mL (07/03/21 2017)  ? [START ON 07/06/2021] vancomycin    ? ? ? LOS: 10 days  ? ?Cherene Altes, MD ?[facility] ?Office  [phone] ?Pager - Text Page per Shea Evans ? ?If 7PM-7AM, please contact night-coverage per Amion ?07/04/2021, 8:19 AM ? ? ? ? ?

## 2021-07-04 NOTE — Progress Notes (Signed)
Speech Language Pathology Treatment: Dysphagia  ?[name] ?MRN: [id number] ?DOB: [birth year] ?Today's Date: 07/04/2021 ?Time: 1355-1420 ?SLP Time Calculation (min) (ACUTE ONLY): 25 min ? ?Assessment / Plan / Recommendation ?Clinical Impression ? Patient seen for diet tolerance assessment. Assisted patient with self feeding with pureed solids and honey thick liquids via tsp as recommended by MBS on 5/12. Swallowing function consistent with results from instrumental testing. Oral phase with decreased labial seal, lingual protrusion/thrusting, lingual pumping, delayed swallow transit, and anterior labial spillage. Patient was able to consistently initiate a pharyngeal swallow given extra time and without overt s/s of aspiration, consuming approximately 3 bites of pureed solids and three tsp boluses of honey thick liquid in 15 minutes. Unfortunately, severity of deficits will not allow for patient to sustain herself nutritionally on pos alone, warranting longer term non-oral means of nutrition. Note plans for PEG on 5/15.  SLP will continue to follow along.  ?  ?HPI HPI: 81 y.o. female with medical history significant of HTN, remote stroke with residual left-sided weakness, anxiety/depression, HLD, sent by daughter for fall this afternoon.      Last known normal was around 10 AM today.  Daughter found patient on the floor this afternoon around 1 PM.  Patient obviously unable to talk but EMS arrived, EMS interviewed the daughter on scene but found little additional information.  EMS also found the patient's pill bottles not organized and some of her BP meds pill boxes full with expiration date already passed; MRI head on 06/25/21 indicated small acute infarct lateral L thalamus; old occipital infarct.  10/09/19 MBS while inpatient revealed significant oral dysphagia. ?  ?   ?SLP Plan ? Continue with current plan of care ? ?  ?  ?Recommendations for follow up therapy are one component of a  multi-disciplinary discharge planning process, led by the attending physician.  Recommendations may be updated based on patient status, additional functional criteria and insurance authorization. ?  ? ?Recommendations  ?Diet recommendations: Dysphagia 1 (puree);Honey-thick liquid ?Liquids provided via: Teaspoon ?Medication Administration: Via alternative means ?Supervision: Full supervision/cueing for compensatory strategies;Trained caregiver to feed patient ?Compensations: Minimize environmental distractions;Slow rate;Small sips/bites;Lingual sweep for clearance of pocketing;Clear throat intermittently;Monitor for anterior loss ?Postural Changes and/or Swallow Maneuvers: Seated upright 90 degrees  ?   ?    ?   ? ? ? ? Oral Care Recommendations: Oral care QID ?Follow Up Recommendations: Skilled nursing-short term rehab (<3 hours/day) ?Assistance recommended at discharge: Frequent or constant Supervision/Assistance ?SLP Visit Diagnosis: Dysphagia, oropharyngeal phase (R13.12);Cognitive communication deficit (R41.841) ?Plan: Continue with current plan of care ? ? ? ? ?  ?  ? ?Aronda Burford MA, CCC-SLP ? ?Shayli Altemose Meryl ? ?07/04/2021, 2:21 PM ?

## 2021-07-05 DIAGNOSIS — I639 Cerebral infarction, unspecified: Secondary | ICD-10-CM | POA: Diagnosis not present

## 2021-07-05 LAB — GLUCOSE, CAPILLARY
Glucose-Capillary: 127 mg/dL — ABNORMAL HIGH (ref 70–99)
Glucose-Capillary: 144 mg/dL — ABNORMAL HIGH (ref 70–99)
Glucose-Capillary: 113 mg/dL — ABNORMAL HIGH (ref 70–99)
Glucose-Capillary: 112 mg/dL — ABNORMAL HIGH (ref 70–99)
Glucose-Capillary: 121 mg/dL — ABNORMAL HIGH (ref 70–99)
Glucose-Capillary: 107 mg/dL — ABNORMAL HIGH (ref 70–99)

## 2021-07-05 LAB — CBC
HCT: 34.6 % — ABNORMAL LOW (ref 36.0–46.0)
Hemoglobin: 10.7 g/dL — ABNORMAL LOW (ref 12.0–15.0)
MCH: 25.7 pg — ABNORMAL LOW (ref 26.0–34.0)
MCHC: 30.9 g/dL (ref 30.0–36.0)
MCV: 83.2 fL (ref 80.0–100.0)
Platelets: 306 10*3/uL (ref 150–400)
RBC: 4.16 MIL/uL (ref 3.87–5.11)
RDW: 14 % (ref 11.5–15.5)
WBC: 5.8 10*3/uL (ref 4.0–10.5)
nRBC: 0 % (ref 0.0–0.2)

## 2021-07-05 LAB — PROTIME-INR
INR: 1.1 (ref 0.8–1.2)
Prothrombin Time: 14.1 seconds (ref 11.4–15.2)

## 2021-07-05 MED ORDER — ROSUVASTATIN CALCIUM 20 MG PO TABS
20.0000 mg | ORAL_TABLET | Freq: Every day | ORAL | Status: DC
  Administered 2021-07-06 – 2021-07-08 (×3): 20 mg
  Filled 2021-07-05 (×3): qty 1

## 2021-07-05 MED ORDER — FLUCONAZOLE 100 MG PO TABS
100.0000 mg | ORAL_TABLET | Freq: Every day | ORAL | Status: DC
  Administered 2021-07-05 – 2021-07-08 (×4): 100 mg
  Filled 2021-07-05 (×4): qty 1

## 2021-07-05 MED ORDER — FLUCONAZOLE 40 MG/ML PO SUSR
100.0000 mg | Freq: Every day | ORAL | Status: DC
  Filled 2021-07-05: qty 2.5

## 2021-07-05 NOTE — Progress Notes (Signed)
? ?[name]  [id number] DOB: [birth year] DOA: 06/24/2021 ?PCP: Barbara Organ., MD   ? ?Brief Narrative:  ?81yo with a history of CVA and residual left hemiparesis who presented to the ER after a fall and was found to have right hemiparesis and a left gaze preference.  MRI brain in the ER revealed a small left thalamic stroke. ? ?Significant Events: ?5/3: Admitted and antihypertensives started ?5/5: Failed modified barium, Cortrak placed ?5/8: IR and Palliative care consulted for ?PEG ?  ?Consultants:  ?Neurology ?Palliative Care ?IR ? ?Code Status: NO CODE / DNR ? ?DVT prophylaxis: ?Lovenox ? ?Interim Hx: ?Afebrile.  Vital signs stable.  No acute events reported overnight.  Able to communicate with me using head nods.  Appears to be resting comfortably.  No evidence of acute distress. ? ?Assessment & Plan: ? ?Acute ischemic stroke  ?MRI brain noted a small L thalamic stroke ?Non-invasive angiography noted severe distal left M1 stenosis, 3 x 4 mm ACOM aneurysm, and 50% stenosis of the proximal right ICA ?TTE without cardiogenic source of embolism ?- Continue aspirin + Brilinta after PEG tube placed ?- Continue Zetia, atorvastatin ?- Continue BP control ? ?Possible Transient Atrial fibrillation ?she has had a few very brief (1-2 seconds) runs of SVT on monitoring - do not feel full anticoag in her best interest/clearly indicated at this time ? ?Hypertensive emergency ?Initial BP >220/120 - beta-blocker discontinued due to bradycardia -blood pressure presently well controlled -heart rate controlled ? ?Dysphagia ?Discussed with Neurology, in the setting of her prior lacunar infarcts, this stroke appears to be causing a more substantial bulbar deficit than this particular stroke otherwise would by itself - the end result is that she has severe dysarthria and dysphagia, and it is possible she may never fully recover speech and swallowing ability - overall, however, she had a good functional status and cognitive  status prior to her stroke, it is possible that these swallowing and speech deficits would be isolated and she would have a good quality of life, making PEG reasonable - proceed with planning for PEG after ticagrelor washout: ?- Hold Brilinta evening 5/10 ?- Hold aspirin 5/11 ?- Hold Lovenox 5/14 ?- plan for PEG placement by IR on Monday 5/15 ? ?Malnutrition of moderate degree ?As evidenced by mild fat depletion, moderate muscle depletion and stroke with severe dysphagia - continue tube feeds and free water as recommended by nutrition consult ? ?Anxiety ?- Continue home bupropion as short acting (prior med held given couldn't crush) ?- Continue escitalopram, dose increased here ? ?Family Communication: No family present at time of exam ?Disposition: Awaiting PEG tube placement in IR - will require SNF placement after PEG in place ? ? ?Objective: ?Blood pressure 140/76, pulse 65, temperature 98.6 ?F (37 ?C), temperature source Oral, resp. rate 16, height 5\' 3"  (1.6 m), weight 60.4 kg, SpO2 98 %. ? ?Intake/Output Summary (Last 24 hours) at 07/05/2021 0914 ?Last data filed at 07/05/2021 0030 ?Gross per 24 hour  ?Intake 60 ml  ?Output 525 ml  ?Net -465 ml  ? ? ?Filed Weights  ? 06/29/21 0430 06/30/21 0500 07/01/21 0500  ?Weight: 61.5 kg 65.1 kg 60.4 kg  ? ? ?Examination: ?General: No acute respiratory distress -alert ?Lungs: CTA B ?Cardiovascular: RRR ?Abdomen: NT/ND, soft, BS positive, no mass ?Extremities: No C/C/E B lower extremities ? ?CBC: ?Recent Labs  ?Lab 06/29/21 ?0235 07/01/21 ?0231 07/04/21 ?[id number]  ?WBC 6.4 6.5 6.8  ?HGB 13.5 13.4 11.1*  ?HCT 42.2 42.7 35.9*  ?  MCV 81.3 81.3 82.3  ?PLT 304 300 325  ? ? ?Basic Metabolic Panel: ?Recent Labs  ?Lab 06/29/21 ?0235 07/01/21 ?0231 07/04/21 ?[id number]  ?NA 136 136 135  ?K 3.8 3.6 3.9  ?CL 102 103 101  ?CO2 25 25 27   ?GLUCOSE 117* 95 98  ?BUN 24* 26* 37*  ?CREATININE 0.91 0.89 1.08*  ?CALCIUM 9.5 9.6 9.5  ?MG  --   --  2.2  ?PHOS  --   --  5.0*  ? ? ?GFR: ?Estimated Creatinine  Clearance: 35.5 mL/min (A) (by C-G formula based on SCr of 1.08 mg/dL (H)). ? ?Liver Function Tests: ?Recent Labs  ?Lab 07/01/21 ?0231  ?AST 23  ?ALT 12  ?ALKPHOS 42  ?BILITOT 1.4*  ?PROT 6.9  ?ALBUMIN 3.9  ? ? ?Scheduled Meds: ? amLODipine  10 mg Per Tube Daily  ? atorvastatin  40 mg Per Tube Daily  ? buPROPion  75 mg Per Tube BID  ? escitalopram  20 mg Per Tube Daily  ? ezetimibe  10 mg Per Tube Daily  ? feeding supplement (PROSource TF)  45 mL Per Tube BID  ? free water  200 mL Per Tube Q4H  ? isosorbide-hydrALAZINE  1 tablet Per Tube TID  ? lisinopril  40 mg Per Tube Daily  ? mouth rinse  15 mL Mouth Rinse BID  ? metoprolol tartrate  12.5 mg Per Tube BID  ? pantoprazole sodium  40 mg Per Tube Daily  ? ?Continuous Infusions: ? feeding supplement (OSMOLITE 1.5 CAL) 1,000 mL (07/05/21 0601)  ? [START ON 07/06/2021] vancomycin    ? ? ? LOS: 11 days  ? ?Cherene Altes, MD ?[facility] ?Office  [phone] ?Pager - Text Page per Shea Evans ? ?If 7PM-7AM, please contact night-coverage per Amion ?07/05/2021, 9:14 AM ? ? ? ? ?

## 2021-07-06 ENCOUNTER — Inpatient Hospital Stay (HOSPITAL_COMMUNITY): Payer: Medicare HMO

## 2021-07-06 DIAGNOSIS — I639 Cerebral infarction, unspecified: Secondary | ICD-10-CM | POA: Diagnosis not present

## 2021-07-06 HISTORY — PX: IR GASTROSTOMY TUBE MOD SED: IMG625

## 2021-07-06 LAB — BASIC METABOLIC PANEL
Anion gap: 6 (ref 5–15)
BUN: 20 mg/dL (ref 8–23)
CO2: 28 mmol/L (ref 22–32)
Calcium: 9.5 mg/dL (ref 8.9–10.3)
Chloride: 102 mmol/L (ref 98–111)
Creatinine, Ser: 0.78 mg/dL (ref 0.44–1.00)
GFR, Estimated: >60 mL/min (ref >60)
Glucose, Bld: 97 mg/dL (ref 70–99)
Potassium: 4.2 mmol/L (ref 3.5–5.1)
Sodium: 136 mmol/L (ref 135–145)

## 2021-07-06 LAB — GLUCOSE, CAPILLARY
Glucose-Capillary: 101 mg/dL — ABNORMAL HIGH (ref 70–99)
Glucose-Capillary: 95 mg/dL (ref 70–99)
Glucose-Capillary: 101 mg/dL — ABNORMAL HIGH (ref 70–99)
Glucose-Capillary: 112 mg/dL — ABNORMAL HIGH (ref 70–99)
Glucose-Capillary: 133 mg/dL — ABNORMAL HIGH (ref 70–99)

## 2021-07-06 MED ORDER — IOHEXOL 300 MG/ML  SOLN
100.0000 mL | Freq: Once | INTRAMUSCULAR | Status: AC | PRN
  Administered 2021-07-06: 10 mL

## 2021-07-06 MED ORDER — MIDAZOLAM HCL 2 MG/2ML IJ SOLN
INTRAMUSCULAR | Status: AC
  Filled 2021-07-06: qty 2

## 2021-07-06 MED ORDER — FENTANYL CITRATE (PF) 100 MCG/2ML IJ SOLN
INTRAMUSCULAR | Status: AC
  Filled 2021-07-06: qty 2

## 2021-07-06 MED ORDER — STERILE WATER FOR INJECTION IJ SOLN
INTRAMUSCULAR | Status: AC
  Filled 2021-07-06: qty 20

## 2021-07-06 MED ORDER — LIDOCAINE-EPINEPHRINE 1 %-1:100000 IJ SOLN
INTRAMUSCULAR | Status: AC
  Filled 2021-07-06: qty 1

## 2021-07-06 MED ORDER — ISOSORB DINITRATE-HYDRALAZINE 20-37.5 MG PO TABS
2.0000 | ORAL_TABLET | Freq: Three times a day (TID) | ORAL | Status: DC
  Administered 2021-07-06 – 2021-07-08 (×7): 2
  Filled 2021-07-06 (×7): qty 2

## 2021-07-06 MED ORDER — FENTANYL CITRATE (PF) 100 MCG/2ML IJ SOLN
INTRAMUSCULAR | Status: AC | PRN
  Administered 2021-07-06: 25 ug via INTRAVENOUS

## 2021-07-06 MED ORDER — MIDAZOLAM HCL 2 MG/2ML IJ SOLN
INTRAMUSCULAR | Status: AC | PRN
Start: 2021-07-06 — End: 2021-07-06
  Administered 2021-07-06: .5 mg via INTRAVENOUS

## 2021-07-06 MED ORDER — VANCOMYCIN HCL IN DEXTROSE 1-5 GM/200ML-% IV SOLN
INTRAVENOUS | Status: AC
  Filled 2021-07-06: qty 200

## 2021-07-06 NOTE — TOC Progression Note (Signed)
Transition of Care (TOC) - Progression Note  ? ? ?Patient Details  ?Name: MARCHELL MUNROE ?MRN: [id number] ?Date of Birth: 22-Jun-1940 ? ?Transition of Care (TOC) CM/SW Contact  ?Geralynn Ochs, LCSW ?Phone Number: ?07/06/2021, 4:15 PM ? ?Clinical Narrative:   CSW updated by MD that patient will have peg placed today, plan for SNF tomorrow if procedure goes well. CSW confirmed [facility] bed availability. Patient's [employer] is not managed by [facility], so CSW requested that [employer] authorization. CSW sent updated PT note for auth request. CSW to follow. ? ? ? ?Expected Discharge Plan: [facility] ?Barriers to Discharge: Continued Medical Work up, Ship broker ? ?Expected Discharge Plan and Services ?Expected Discharge Plan: [place] ?  ?  ?Post Acute Care Choice: [place] ?Living arrangements for the past 2 months: [place] ?                ?  ?  ?  ?  ?  ?  ?  ?  ?  ?  ? ? ?Social Determinants of Health (SDOH) Interventions ?  ? ?Readmission Risk Interventions ? ?  10/09/2019  ? 10:55 AM  ?Readmission Risk Prevention Plan  ?Post Dischage Appt Not Complete  ?Appt Comments rec for SNF  ?Medication Screening Complete  ?Transportation Screening Complete  ? ? ?

## 2021-07-06 NOTE — Plan of Care (Signed)
  Problem: Education: Goal: Knowledge of General Education information will improve Description: Including pain rating scale, medication(s)/side effects and non-pharmacologic comfort measures Outcome: Not Progressing   Problem: Health Behavior/Discharge Planning: Goal: Ability to manage health-related needs will improve Outcome: Not Progressing   

## 2021-07-06 NOTE — Procedures (Signed)
Interventional Radiology Procedure Note  Procedure: Placement of percutaneous 20F balloon retention gastrostomy tube.  Complications: None  Recommendations: - NPO except for sips and chips remainder of today and overnight - Maintain G-tube to LWS until tomorrow morning  - May advance diet as tolerated and begin using tube tomorrow morning   Allaya Abbasi, MD    

## 2021-07-06 NOTE — Progress Notes (Signed)
? [name]  [id number] DOB: 1941-01-14 DOA: 06/24/2021 ?PCP: [name]., MD   ? ?Brief Narrative:  ?81yo with a history of CVA and residual left hemiparesis who presented to the ER after a fall and was found to have right hemiparesis and a left gaze preference.  MRI brain in the ER revealed a small left thalamic stroke. ? ?Significant Events: ?5/3: Admitted and antihypertensives started ?5/5: Failed modified barium, Cortrak placed ?5/8: IR and Palliative care consulted for ?PEG ?  ?Consultants:  ?Neurology ?Palliative Care ?IR ? ?Code Status: NO CODE / DNR ? ?DVT prophylaxis: ?Lovenox ? ?Interim Hx: ?No acute events reported overnight.  Blood pressure trending up with systolics in the [id number] range.  Vital signs otherwise stable.  Is awake and appears comfortable at the time of my exam.  Reports no complaints on my exam. ? ?Assessment & Plan: ? ?Acute ischemic stroke  ?MRI brain noted a small L thalamic stroke ?Non-invasive angiography noted severe distal left M1 stenosis, 3 x 4 mm ACOM aneurysm, and 50% stenosis of the proximal right ICA ?TTE without cardiogenic source of embolism ?- Continue aspirin + Brilinta after PEG tube placed ?- Continue Zetia, atorvastatin ?- Continue BP control ? ?Possible Transient Atrial fibrillation ?she has had a few very brief (1-2 seconds) runs of SVT on monitoring - do not feel full anticoag in her best interest/clearly indicated at this time ? ?Hypertensive emergency ?Initial BP >220/120 - beta-blocker discontinued due to bradycardia -blood pressure trending upward -adjust treatment and follow trend ? ?Dysphagia ?Discussed with Neurology, in the setting of her prior lacunar infarcts, this stroke appears to be causing a more substantial bulbar deficit than this particular stroke otherwise would by itself - the end result is that she has severe dysarthria and dysphagia, and it is possible she may never fully recover speech and swallowing ability - overall, however, she had  a good functional status and cognitive status prior to her stroke, it is possible that these swallowing and speech deficits would be isolated and she would have a good quality of life, making PEG reasonable - proceed with planning for PEG after ticagrelor washout: ?- Hold Brilinta evening 5/10 ?- Hold aspirin 5/11 ?- Hold Lovenox 5/14 ?- plan for PEG placement by IR on Monday 5/15 ? ?Malnutrition of moderate degree ?As evidenced by mild fat depletion, moderate muscle depletion and stroke with severe dysphagia - continue tube feeds and free water as recommended by nutrition consult ? ?Anxiety ?- Continue home bupropion as short acting (prior med held given couldn't crush) ?- Continue escitalopram, dose increased here ? ?Family Communication: No family present at time of exam ?Disposition: Awaiting PEG tube placement in IR - will require SNF placement after PEG in place ? ? ?Objective: ?Blood pressure (!) 171/71, pulse 61, temperature 98 ?F (36.7 ?C), temperature source Oral, resp. rate 16, height 5\' 3"  (1.6 m), weight 60.4 kg, SpO2 98 %. ? ?Intake/Output Summary (Last 24 hours) at 07/06/2021 0845 ?Last data filed at 07/06/2021 0515 ?Gross per 24 hour  ?Intake 0 ml  ?Output 1200 ml  ?Net -1200 ml  ? ? ?Filed Weights  ? 06/29/21 0430 06/30/21 0500 07/01/21 0500  ?Weight: 61.5 kg 65.1 kg 60.4 kg  ? ? ?Examination: ?General: No acute respiratory distress  -alert and interactive but not verbal ?Lungs: CTA B -no wheezing ?Cardiovascular: RRR ?Abdomen: NT/ND, soft, BS positive, no mass ?Extremities: No C/C/E B lower extremities ? ?CBC: ?Recent Labs  ?Lab 07/01/21 ?0231 07/04/21 ?  [id number] 07/05/21 ?2326  ?WBC 6.5 6.8 5.8  ?HGB 13.4 11.1* 10.7*  ?HCT 42.7 35.9* 34.6*  ?MCV 81.3 82.3 83.2  ?PLT 300 325 306  ? ? ?Basic Metabolic Panel: ?Recent Labs  ?Lab 07/01/21 ?0231 07/04/21 ?[id number] 07/06/21 ?0306  ?NA 136 135 136  ?K 3.6 3.9 4.2  ?CL 103 101 102  ?CO2 25 27 28   ?GLUCOSE 95 98 97  ?BUN 26* 37* 20  ?CREATININE 0.89 1.08* 0.78   ?CALCIUM 9.6 9.5 9.5  ?MG  --  2.2  --   ?PHOS  --  5.0*  --   ? ? ?GFR: ?Estimated Creatinine Clearance: 47.9 mL/min (by C-G formula based on SCr of 0.78 mg/dL). ? ?Liver Function Tests: ?Recent Labs  ?Lab 07/01/21 ?0231  ?AST 23  ?ALT 12  ?ALKPHOS 42  ?BILITOT 1.4*  ?PROT 6.9  ?ALBUMIN 3.9  ? ? ?Scheduled Meds: ? amLODipine  10 mg Per Tube Daily  ? buPROPion  75 mg Per Tube BID  ? escitalopram  20 mg Per Tube Daily  ? ezetimibe  10 mg Per Tube Daily  ? feeding supplement (PROSource TF)  45 mL Per Tube BID  ? fluconazole  100 mg Per Tube Daily  ? free water  200 mL Per Tube Q4H  ? isosorbide-hydrALAZINE  1 tablet Per Tube TID  ? lisinopril  40 mg Per Tube Daily  ? mouth rinse  15 mL Mouth Rinse BID  ? metoprolol tartrate  12.5 mg Per Tube BID  ? pantoprazole sodium  40 mg Per Tube Daily  ? rosuvastatin  20 mg Per Tube Daily  ? ?Continuous Infusions: ? feeding supplement (OSMOLITE 1.5 CAL) Stopped (07/06/21 0009)  ? vancomycin    ? ? ? LOS: 12 days  ? ?Cherene Altes, MD ?[facility] ?Office  [phone] ?Pager - Text Page per Shea Evans ? ?If 7PM-7AM, please contact night-coverage per Amion ?07/06/2021, 8:45 AM ? ? ? ? ?

## 2021-07-06 NOTE — Progress Notes (Signed)
Physical Therapy Treatment ?[name] ?MRN: [id number] ?DOB: May 23, 1940 ?Today's Date: 07/06/2021 ? ? ?History of Present Illness Pt is a 81 y/o female admitted secondary to R weakness and fall. MRI showed L thalamus infarct. PMH includes HTN and CVA. ? ?  ?PT Comments  ? ? Pt received supine in bed soiled in urine from head to toe. Pt unable to express needs of having to use the bathroom or that she needed cleaned up. Pt requiring modA for all mobility as pt requires constant verbal and tactile cues to sequence task, initiate task, and physical assist due to impaired balance and weakness. Pt did appear to follow more simple commands today compared to previous sessions. Pt remains unsafe to return home alone and would benefit from ST-SNF up d/c to address above deficits and achieve the level of functioning needed for return home. Acute PT to cont to follow. ?  ?Recommendations for follow up therapy are one component of a multi-disciplinary discharge planning process, led by the attending physician.  Recommendations may be updated based on patient status, additional functional criteria and insurance authorization. ? ?Follow Up Recommendations ? Skilled nursing-short term rehab (<3 hours/day) ?  ?  ?Assistance Recommended at Discharge Frequent or constant Supervision/Assistance  ?Patient can return home with the following A lot of help with walking and/or transfers;A lot of help with bathing/dressing/bathroom;Assistance with cooking/housework;Direct supervision/assist for medications management;Direct supervision/assist for financial management;Assist for transportation;Help with stairs or ramp for entrance ?  ?Equipment Recommendations ? Rolling walker (2 wheels)  ?  ?Recommendations for Other Services Rehab consult ? ? ?  ?Precautions / Restrictions Precautions ?Precautions: Fall ?Restrictions ?Weight Bearing Restrictions: No  ?  ? ?Mobility ? Bed Mobility ?Overal bed mobility: Needs  Assistance ?Bed Mobility: Supine to Sit ?  ?  ?Supine to sit: HOB elevated, Mod assist ?  ?  ?General bed mobility comments: increased time, verbal and tactile cues for sequencing, modA for trunk elevation and to sit square on EOB ?  ? ?Transfers ?Overall transfer level: Needs assistance ?Equipment used: Rolling walker (2 wheels), 1 person hand held assist ?Transfers: Sit to/from Stand, Bed to chair/wheelchair/BSC ?Sit to Stand: Mod assist ?Stand pivot transfers: Mod assist ?  ?  ?  ?  ?General transfer comment: max verbal and tactile cues to initiate anterior weight shift into standing, pt did pull self up at the sink with verbal cues, pt with posterior lean/retropulsion, max manual guidance for weight shifting to complete step pvt to the chair ?  ? ?Ambulation/Gait ?Ambulation/Gait assistance: Mod assist ?Gait Distance (Feet): 80 Feet ?Assistive device: Rolling walker (2 wheels) ?Gait Pattern/deviations: Step-through pattern, Decreased stride length, Narrow base of support, Shuffle ?Gait velocity: decreased ?  ?  ?General Gait Details: pt initially requiring PT to maintain forward propulsion of RW, pt initially very guarded with resistance/retropulsion to sequencing stepping, pt progressed to more neutral stance and reciprocal gait pattern, not as much shuffling today but conintues with short step height and length, with onset of fatigue pt with decreased pace and retropulsion again ? ? ?Stairs ?  ?  ?  ?  ?  ? ? ?Wheelchair Mobility ?  ? ?Modified Rankin (Stroke Patients Only) ?Modified Rankin (Stroke Patients Only) ?Pre-Morbid Rankin Score: No significant disability ?Modified Rankin: Moderately severe disability ? ? ?  ?Balance Overall balance assessment: Needs assistance ?Sitting-balance support: No upper extremity supported, Feet supported ?Sitting balance-Leahy Scale: Fair ?Sitting balance - Comments: With dynamic reach tasks, pt able to  maintain sitting balance with min guard ?  ?Standing balance support:  Bilateral upper extremity supported, During functional activity, Single extremity supported ?Standing balance-Leahy Scale: Poor ?Standing balance comment: Pt was able to perform pericare at sink with support of L UE on counter, no LOB ?  ?  ?  ?  ?  ?  ?  ?  ?  ?  ?  ?  ? ?  ?Cognition Arousal/Alertness: Awake/alert ?Behavior During Therapy: Flat affect ?Overall Cognitive Status: No family/caregiver present to determine baseline cognitive functioning ?  ?  ?  ?  ?  ?  ?  ?  ?  ?  ?  ?  ?  ?  ?  ?  ?General Comments: Slow processing. Following simple commands with increased time and tactile cues. No initiation of verbalizing, will attempt to answer question however muffled and unitelligible ?  ?  ? ?  ?Exercises   ? ?  ?General Comments General comments (skin integrity, edema, etc.): VSS ?  ?  ? ?Pertinent Vitals/Pain Pain Assessment ?Pain Assessment: Faces ?Faces Pain Scale: No hurt  ? ? ?Home Living   ?  ?  ?  ?  ?  ?  ?  ?  ?  ?   ?  ?Prior Function    ?  ?  ?   ? ?PT Goals (current goals can now be found in the care plan section) Acute Rehab PT Goals ?Patient Stated Goal: unable to state ?PT Goal Formulation: Patient unable to participate in goal setting ?Time For Goal Achievement: 07/09/21 ?Potential to Achieve Goals: Good ?Progress towards PT goals: Progressing toward goals ? ?  ?Frequency ? ? ? Min 3X/week ? ? ? ?  ?PT Plan Current plan remains appropriate  ? ? ?Co-evaluation   ?  ?  ?  ?  ? ?  ?AM-PAC PT "6 Clicks" Mobility   ?Outcome Measure ? Help needed turning from your back to your side while in a flat bed without using bedrails?: A Little ?Help needed moving from lying on your back to sitting on the side of a flat bed without using bedrails?: A Lot ?Help needed moving to and from a bed to a chair (including a wheelchair)?: A Lot ?Help needed standing up from a chair using your arms (e.g., wheelchair or bedside chair)?: A Lot ?Help needed to walk in hospital room?: A Lot ?Help needed climbing 3-5 steps  with a railing? : Total ?6 Click Score: 12 ? ?  ?End of Session Equipment Utilized During Treatment: Gait belt ?Activity Tolerance: Patient tolerated treatment well ?Patient left: with call bell/phone within reach;in chair;with chair alarm set;with nursing/sitter in room ?Nurse Communication: Mobility status ?PT Visit Diagnosis: Other abnormalities of gait and mobility (R26.89);Difficulty in walking, not elsewhere classified (R26.2);Other symptoms and signs involving the nervous system (R29.898) ?  ? ? ?Time: [id number] ?PT Time Calculation (min) (ACUTE ONLY): 26 min ? ?Charges:  $Gait Training: 8-22 mins ?$Therapeutic Activity: 8-22 mins          ?          ? ?Kittie Plater, PT, DPT ?Acute Rehabilitation Services ?Secure chat preferred ?Office #: [phone] ? ? ? ?Alice Burnside M Samanthajo Payano ?07/06/2021, 11:51 AM ? ?

## 2021-07-06 NOTE — Progress Notes (Signed)
Modified Barium Swallow Progress Note ? ?Patient Details  ?Name: GIAVONNI CIZEK ?MRN: [id number] ?Date of Birth: 19-Jan-1941 ? ?Today's Date: 07/06/2021 ? ?Modified Barium Swallow completed.  Full report located under Chart Review in the Imaging Section. ? ?Brief recommendations include the following: ? ?Clinical Impression ?  Pt's pharyngeal swallow function has improved from prior study 5/5 with only one instance of aspiration and no other airway intrusion present. Her oral dysphagia continues to be quite significant marked by decreased bolus control and cohesion leading to right anterior spill, anterior sulcus and lingual residue, delayed bolus formation and transit. From pharyngeal standpoint, her onset of laryngeal swallow was delayed across all textures mostly to her pyriform sinuses. SHe had less episodes of aspiraiton before the swallow than previously (thin and nectar) and one out of four penetrated with nectar before the swallow and was silently aspirated during. No compensatory strategies were attempted given her current level of cognitive impairments. There was no pharyngeal residue. Recommend FULL assist and supervision of Dys 1 (puree), honey thick liquids from a teaspoon monitoring and cueing for anterior spill and oral residue. Can crush pills in applesauce or if excessive spill, RN may give via Cortrak. Consider decreased tube feeds to promote hunger if appropriate. ST will continue to follow ?  ?Swallow Evaluation Recommendations ? ?  Dys 1 (puree), Honey thick liquids via spoon.  ? ?   ? ?   ? ?   ? ?   ? ?   ? ?   ? ?   ? ?   ? ? ? ?Royce Macadamia ?07/06/2021,2:41 PM ?

## 2021-07-07 DIAGNOSIS — I639 Cerebral infarction, unspecified: Secondary | ICD-10-CM | POA: Diagnosis not present

## 2021-07-07 LAB — CBC
HCT: 36.5 % (ref 36.0–46.0)
Hemoglobin: 11.3 g/dL — ABNORMAL LOW (ref 12.0–15.0)
MCH: 25.1 pg — ABNORMAL LOW (ref 26.0–34.0)
MCHC: 31 g/dL (ref 30.0–36.0)
MCV: 81.1 fL (ref 80.0–100.0)
Platelets: 351 10*3/uL (ref 150–400)
RBC: 4.5 MIL/uL (ref 3.87–5.11)
RDW: 13.8 % (ref 11.5–15.5)
WBC: 8.1 10*3/uL (ref 4.0–10.5)
nRBC: 0 % (ref 0.0–0.2)

## 2021-07-07 LAB — GLUCOSE, CAPILLARY
Glucose-Capillary: 107 mg/dL — ABNORMAL HIGH (ref 70–99)
Glucose-Capillary: 111 mg/dL — ABNORMAL HIGH (ref 70–99)
Glucose-Capillary: 109 mg/dL — ABNORMAL HIGH (ref 70–99)
Glucose-Capillary: 107 mg/dL — ABNORMAL HIGH (ref 70–99)
Glucose-Capillary: 140 mg/dL — ABNORMAL HIGH (ref 70–99)
Glucose-Capillary: 134 mg/dL — ABNORMAL HIGH (ref 70–99)

## 2021-07-07 MED ORDER — ASPIRIN 81 MG PO CHEW
81.0000 mg | CHEWABLE_TABLET | Freq: Every day | ORAL | Status: DC
  Administered 2021-07-08: 81 mg
  Filled 2021-07-07: qty 1

## 2021-07-07 MED ORDER — TICAGRELOR 90 MG PO TABS: 90.0000 mg | ORAL_TABLET | Freq: Two times a day (BID) | ORAL | Status: DC

## 2021-07-07 MED ORDER — PANTOPRAZOLE SODIUM 40 MG PO PACK: 40.0000 mg | PACK | Freq: Every day | ORAL | Status: AC

## 2021-07-07 MED ORDER — ROSUVASTATIN CALCIUM 20 MG PO TABS: 20.0000 mg | ORAL_TABLET | Freq: Every day | ORAL | Status: AC

## 2021-07-07 MED ORDER — TICAGRELOR 90 MG PO TABS
90.0000 mg | ORAL_TABLET | Freq: Two times a day (BID) | ORAL | Status: DC
Start: 2021-07-08 — End: 2021-07-08

## 2021-07-07 MED ORDER — BUPROPION HCL 75 MG PO TABS: 75.0000 mg | ORAL_TABLET | Freq: Two times a day (BID) | ORAL | Status: AC

## 2021-07-07 MED ORDER — FREE WATER
200.0000 mL | Status: DC
Start: 2021-07-07 — End: 2021-07-08

## 2021-07-07 MED ORDER — FLUCONAZOLE 100 MG PO TABS: 100.0000 mg | ORAL_TABLET | Freq: Every day | ORAL | Status: AC

## 2021-07-07 MED ORDER — ESCITALOPRAM OXALATE 20 MG PO TABS: 20.0000 mg | ORAL_TABLET | Freq: Every day | ORAL | Status: AC

## 2021-07-07 MED ORDER — METOPROLOL TARTRATE 25 MG PO TABS: 12.5000 mg | ORAL_TABLET | Freq: Two times a day (BID) | ORAL | Status: AC

## 2021-07-07 MED ORDER — EZETIMIBE 10 MG PO TABS: 10.0000 mg | ORAL_TABLET | Freq: Every day | ORAL | Status: AC

## 2021-07-07 MED ORDER — SENNOSIDES-DOCUSATE SODIUM 8.6-50 MG PO TABS: 1.0000 | ORAL_TABLET | Freq: Every evening | ORAL | Status: AC | PRN

## 2021-07-07 MED ORDER — OSMOLITE 1.5 CAL PO LIQD: 1000.0000 mL | ORAL | 0 refills | Status: DC

## 2021-07-07 MED ORDER — OSMOLITE 1.5 CAL PO LIQD: 1000.0000 mL | ORAL | Status: DC

## 2021-07-07 MED ORDER — ASPIRIN 81 MG PO CHEW
81.0000 mg | CHEWABLE_TABLET | Freq: Every day | ORAL | Status: AC
Start: 2021-07-08 — End: ?

## 2021-07-07 MED ORDER — ISOSORB DINITRATE-HYDRALAZINE 20-37.5 MG PO TABS: 2.0000 | ORAL_TABLET | Freq: Three times a day (TID) | ORAL | Status: AC

## 2021-07-07 MED ORDER — OSMOLITE 1.5 CAL PO LIQD
1000.0000 mL | ORAL | Status: DC
  Administered 2021-07-07: 1000 mL

## 2021-07-07 MED ORDER — AMLODIPINE BESYLATE 10 MG PO TABS: 10.0000 mg | ORAL_TABLET | Freq: Every day | ORAL | Status: AC

## 2021-07-07 MED ORDER — PROSOURCE TF PO LIQD: 45.0000 mL | Freq: Two times a day (BID) | ORAL | Status: AC

## 2021-07-07 MED ORDER — LISINOPRIL 40 MG PO TABS: 40.0000 mg | ORAL_TABLET | Freq: Every day | ORAL | Status: AC

## 2021-07-07 MED ORDER — MAGNESIUM HYDROXIDE 400 MG/5ML PO SUSP: 30.0000 mL | Freq: Every day | ORAL | 0 refills | Status: AC | PRN

## 2021-07-07 MED ORDER — ACETAMINOPHEN 160 MG/5ML PO SOLN
650.0000 mg | ORAL | 0 refills | Status: AC | PRN
Start: 2021-07-07 — End: ?

## 2021-07-07 NOTE — Progress Notes (Signed)
? ? ?Referring Physician(s): Dr. Loleta Books ? ?Supervising Physician: Juliet Rude ? ?Patient Status:  [facility] - In-pt ? ?Chief Complaint: Stroke with ongoing dysphagia s/p Gastrostomy tube placement 07/06/21 by Dr. Serafina Royals  ? ?Subjective: Patient in bed resting. She is awake/alert but non-verbal and only able to follow a few simple commands.  ? ?Allergies: ?Atorvastatin, Celecoxib, Ezetimibe, Penicillin g, Penicillins, Rosuvastatin, Shellfish allergy, and Zoloft [sertraline] ? ?Medications: ?Prior to Admission medications   ?Medication Sig Start Date End Date Taking? Authorizing Provider  ?albuterol (PROAIR HFA) 108 (90 Base) MCG/ACT inhaler 2 puffs every 6 hours as needed for wheezing    [provider]  ?amLODipine (NORVASC) 10 MG tablet Take 1 tablet (10 mg total) by mouth daily. 03/26/20   Medina-Vargas, Monina C, NP  ?amLODipine (NORVASC) 10 MG tablet Take 1 tablet    [provider]  ?aspirin 81 MG EC tablet Take one (1) tablet by mouth daily    [provider]  ?atorvastatin (LIPITOR) 10 MG tablet Take 1 tablet (10 mg total) by mouth daily. 03/26/20   Medina-Vargas, Monina C, NP  ?bisacodyl (DULCOLAX) 10 MG suppository Place 10 mg rectally as needed for moderate constipation.    [provider]  ?buPROPion (WELLBUTRIN XL) 150 MG 24 hr tablet Take 1 tablet (150 mg total) by mouth daily. 03/26/20   Medina-Vargas, Monina C, NP  ?buPROPion (WELLBUTRIN XL) 150 MG 24 hr tablet Take 1 tablet by mouth daily.    [provider]  ?Calcium Carbonate-Vitamin D (CALCIUM 600+D HIGH POTENCY PO) Take 1 tablet by mouth daily.     [provider]  ?Cholecalciferol (VITAMIN D) 2000 UNITS tablet Take 2,000 Units by mouth daily.    [provider]  ?clopidogrel (PLAVIX) 75 MG tablet Take 1 tablet (75 mg total) by mouth daily. 03/26/20   Medina-Vargas, Monina C, NP  ?clopidogrel (PLAVIX) 75 MG tablet Take 1 tablet by mouth daily.    [provider]  ?Elastic Bandages &  Supports (MEDICAL COMPRESSION STOCKINGS) MISC 20-21mmHg knee high compression stockings 11/20/14   [provider]  ?escitalopram (LEXAPRO) 10 MG tablet 1  qam 03/26/20   Medina-Vargas, Monina C, NP  ?ezetimibe (ZETIA) 10 MG tablet Take 1 tablet (10 mg total) by mouth daily. 03/26/20   Medina-Vargas, Monina C, NP  ?hydrALAZINE (APRESOLINE) 25 MG tablet Take 1 tablet by mouth 3 (three) times daily. 09/24/20   [provider]  ?hydrochlorothiazide (HYDRODIURIL) 25 MG tablet Take 1 tablet by mouth daily. 09/24/20   [provider]  ?HYDROcodone-acetaminophen (NORCO/VICODIN) 5-325 MG tablet Take 1-2 Tablets by mouth every 4hrs as needed for moderate pain 01/31/18   [provider]  ?lisinopril (ZESTRIL) 40 MG tablet Take 1 tablet by mouth daily. 10/08/20   [provider]  ?magnesium hydroxide (MILK OF MAGNESIA) 400 MG/5ML suspension Take 30 mLs by mouth daily as needed for mild constipation.    [provider]  ?metoprolol tartrate (LOPRESSOR) 50 MG tablet Take 1 tablet (50 mg total) by mouth daily. 03/26/20   Medina-Vargas, Monina C, NP  ?mirabegron ER (MYRBETRIQ) 25 MG TB24 tablet Take 1 tablet (25 mg total) by mouth daily. 03/26/20   Medina-Vargas, Monina C, NP  ?pantoprazole (PROTONIX) 20 MG tablet Take 20 mg by mouth daily.    [provider]  ?Sodium Phosphates (RA SALINE ENEMA RE) Place 1 each rectally daily as needed.    [provider]  ? ? ? ?Vital Signs: ?BP 117/66   Pulse Marland Kitchen)  57   Temp 98.3 ?F (36.8 ?C)   Resp 18   Ht 5\' 3"  (1.6 m)   Wt 133 lb 2.5 oz (60.4 kg)   SpO2 98%   BMI 23.59 kg/m?  ? ?Physical Exam ?Constitutional:   ?   General: She is not in acute distress. ?   Appearance: She is not ill-appearing.  ?Abdominal:  ?   Palpations: Abdomen is soft.  ?   Comments: Gastrostomy tube intact. Site is clean and dry. Dissolvable t-tacks in place. Abdominal binder in place. Tube currently to LWS until 3 pm per IR orders.   ?Skin: ?   General:  Skin is warm and dry.  ?Neurological:  ?   Mental Status: She is alert. She is disoriented.  ? ? ?Imaging: ?DG Swallowing Func-Speech Pathology ? ?Result Date: 07/03/2021 ?Table formatting from the original result was not included. Objective Swallowing Evaluation: Type of Study: MBS-Modified Barium Swallow Study  Patient Details Name: [name] MRN: [id number] Date of Birth: Aug 27, 1940 Today's Date: 07/03/2021 Time: SLP Start Time (ACUTE ONLY): 93 -SLP Stop Time (ACUTE ONLY): [id number] SLP Time Calculation (min) (ACUTE ONLY): 17 min Past Medical History: Past Medical History: Diagnosis Date  Arthritis   Depression   High cholesterol   Hypertension   Pneumonia   Stroke ([place])   1994 no residuals Past Surgical History: Past Surgical History: Procedure Laterality Date  ABDOMINAL HYSTERECTOMY    catheterization of the left superficial femoral artery  10/20/2009  SHOULDER ARTHROSCOPY WITH ROTATOR CUFF REPAIR AND SUBACROMIAL DECOMPRESSION Left 01/27/2018  Procedure: LEFT SHOULDER ARTHROSCOPY WITH DEBRIDEMENT, SUBACROMIAL DECOMPRESSION AND ROTATOR CUFF REPAIR;  Surgeon: Mcarthur Rossetti, MD;  Location: [facility] ORS;  Service: Orthopedics;  Laterality: Left;  SHOULDER SURGERY    Left with dr. Felicie Morn arthroscopy with possible rotator cuff repair  TONSILLECTOMY   HPI: 81 y.o. female with medical history significant of HTN, remote stroke with residual left-sided weakness, anxiety/depression, HLD, sent by daughter for fall this afternoon.      Last known normal was around 10 AM today.  Daughter found patient on the floor this afternoon around 1 PM.  Patient obviously unable to talk but EMS arrived, EMS interviewed the daughter on scene but found little additional information.  EMS also found the patient's pill bottles not organized and some of her BP meds pill boxes full with expiration date already passed; MRI head on 06/25/21 indicated small acute infarct lateral L thalamus; old occipital infarct.  10/09/19 MBS while inpatient  revealed significant oral dysphagia.  Subjective: Patient awake and alert and attentive  Recommendations for follow up therapy are one component of a multi-disciplinary discharge planning process, led by the attending physician.  Recommendations may be updated based on patient status, additional functional criteria and insurance authorization. Assessment / Plan / Recommendation   07/03/2021   3:02 PM Clinical Impressions Clinical Impression Pt's pharyngeal swallow function has improved from prior study 5/5 with only one instance of aspiration and no other airway intrusion present. Her oral dysphagia continues to be quite significant marked by decreased bolus control and cohesion leading to right anterior spill, anterior sulcus and lingual residue, delayed bolus formation and transit. From pharyngeal standpoint, her onset of laryngeal swallow was delayed across all textures mostly to her pyriform sinuses. SHe had less episodes of aspiraiton before the swallow than previously (thin and nectar) and one out of four penetrated with nectar before the swallow and was silently aspirated during. No compensatory strategies were attempted given her current  level of cognitive impairments. There was no pharyngeal residue. Recommend FULL assist and supervision of Dys 1 (puree), honey thick liquids from a teaspoon monitoring and cueing for anterior spill and oral residue. Can crush pills in applesauce or if excessive spill, RN may give via Cortrak. Consider decreased tube feeds to promote hunger if appropriate. ST will continue to follow SLP Visit Diagnosis Dysphagia, oropharyngeal phase (R13.12);Cognitive communication deficit (R41.841) Impact on safety and function Moderate aspiration risk     07/03/2021   3:02 PM Treatment Recommendations Treatment Recommendations Therapy as outlined in treatment plan below     07/03/2021   3:02 PM Prognosis Prognosis for Safe Diet Advancement -- Barriers to Reach Goals Cognitive deficits    07/03/2021   3:02 PM Diet Recommendations SLP Diet Recommendations Dysphagia 1 (Puree) solids;Honey thick liquids Liquid Administration via Spoon Medication Administration Crushed with puree Compensations Minimize environm

## 2021-07-07 NOTE — Progress Notes (Addendum)
Occupational Therapy Treatment ?[name] ?MRN: [id number] ?DOB: [birth year] ?Today's Date: 07/07/2021 ? ? ?History of present illness Pt is a 81 y/o female admitted secondary to R weakness and fall. MRI showed L thalamus infarct. PMH includes HTN and CVA. ?  ?OT comments ? Pt seen for OT session, making slow progress towards goals. Pt able to complete bed mobility with min A, step pivot transfer with mod A and increased time. Pt needing ~1 min after verbal/visual cue to initiate tasks, continues to need increased time to perform. Pt presenting with impairments listed below, will follow acutely. Continue to recommend SNF at d/c.  ? ?Recommendations for follow up therapy are one component of a multi-disciplinary discharge planning process, led by the attending physician.  Recommendations may be updated based on patient status, additional functional criteria and insurance authorization. ?   ?Follow Up Recommendations ? Skilled nursing-short term rehab (<3 hours/day)  ?  ?Assistance Recommended at Discharge Frequent or constant Supervision/Assistance  ?Patient can return home with the following ? A lot of help with walking and/or transfers;A lot of help with bathing/dressing/bathroom;Assistance with cooking/housework;Direct supervision/assist for medications management;Assist for transportation;Direct supervision/assist for financial management;Help with stairs or ramp for entrance ?  ?Equipment Recommendations ? None recommended by OT;Other (comment) (defer to next venue of care)  ?  ?Recommendations for Other Services   ? ?  ?Precautions / Restrictions Precautions ?Precautions: Fall ?Precaution Comments: watch BP & HR, syncopal episode 5/9 ?Restrictions ?Weight Bearing Restrictions: No  ? ? ?  ? ?Mobility Bed Mobility ?Overal bed mobility: Needs Assistance ?Bed Mobility: Supine to Sit ?  ?  ?Supine to sit: HOB elevated, Min assist ?  ?  ?General bed mobility comments: increased time needed for pt  to scoot forward ?  ? ?Transfers ?Overall transfer level: Needs assistance ?Equipment used: Rolling walker (2 wheels) ?Transfers: Bed to chair/wheelchair/BSC, Sit to/from Stand ?Sit to Stand: Mod assist ?Stand pivot transfers: Mod assist ?  ?  ?  ?  ?  ?  ?  ?Balance Overall balance assessment: Needs assistance ?Sitting-balance support: No upper extremity supported, Feet supported ?Sitting balance-Leahy Scale: Fair ?  ?  ?Standing balance support: Bilateral upper extremity supported, During functional activity, Single extremity supported ?Standing balance-Leahy Scale: Poor ?  ?  ?  ?  ?  ?  ?  ?  ?  ?  ?  ?  ?   ? ?ADL either performed or assessed with clinical judgement  ? ?ADL Overall ADL's : Needs assistance/impaired ?Eating/Feeding: NPO ?  ?Grooming: Min guard;Sitting ?Grooming Details (indicate cue type and reason): increased time ~1 min to perform task after verbal cue given ?  ?  ?  ?  ?  ?  ?  ?  ?Toilet Transfer: Moderate assistance;Rolling walker (2 wheels);BSC/3in1;Stand-pivot ?Toilet Transfer Details (indicate cue type and reason): simulated to chair ?  ?  ?  ?  ?Functional mobility during ADLs: Moderate assistance;Rolling walker (2 wheels) ?  ?  ? ?Extremity/Trunk Assessment Upper Extremity Assessment ?Upper Extremity Assessment: Generalized weakness ?  ?Lower Extremity Assessment ?Lower Extremity Assessment: Defer to PT evaluation ?  ?  ?  ? ?Vision   ?  ?  ?Perception Perception ?Perception: Not tested ?  ?Praxis Praxis ?Praxis: Not tested ?  ? ?Cognition Arousal/Alertness: Awake/alert ?Behavior During Therapy: Flat affect ?Overall Cognitive Status: No family/caregiver present to determine baseline cognitive functioning ?Area of Impairment: Following commands ?  ?  ?  ?  ?  ?  ?  ?  ?  ?  ?  ?  Following Commands: Follows one step commands with increased time ?  ?  ?  ?General Comments: Slow processing. Following simple commands with increased time and tactile cues. No initiation of verbalizing, will  attempt to answer question however muffled and unitelligible ?  ?  ?   ?Exercises   ? ?  ?Shoulder Instructions   ? ? ?  ?General Comments BP 122/69 HR 58 at end of session  ? ? ?Pertinent Vitals/ Pain       Pain Assessment ?Pain Assessment: Faces ?Pain Score: 0-No pain ?Faces Pain Scale: No hurt ?Breathing: normal ?Negative Vocalization: none ?Facial Expression: smiling or inexpressive ?Body Language: relaxed ?Consolability: no need to console ?PAINAD Score: 0 ?Pain Intervention(s): Monitored during session ? ?Home Living   ?  ?  ?  ?  ?  ?  ?  ?  ?  ?  ?  ?  ?  ?  ?  ?  ?  ?  ? ?  ?Prior Functioning/Environment    ?  ?  ?  ?   ? ?Frequency ? Min 2X/week  ? ? ? ? ?  ?Progress Toward Goals ? ?OT Goals(current goals can now be found in the care plan section) ? Progress towards OT goals: Progressing toward goals ? ?Acute Rehab OT Goals ?Patient Stated Goal: none stated ?OT Goal Formulation: With patient ?Time For Goal Achievement: 07/09/21 ?Potential to Achieve Goals: Good ?ADL Goals ?Pt Will Perform Grooming: with modified independence;standing ?Pt Will Perform Lower Body Bathing: with supervision;sitting/lateral leans;sit to/from stand ?Pt Will Perform Lower Body Dressing: with supervision;sitting/lateral leans;sit to/from stand ?Pt Will Transfer to Toilet: with supervision;ambulating ?Pt Will Perform Toileting - Clothing Manipulation and hygiene: with supervision;sitting/lateral leans;sit to/from stand  ?Plan Frequency remains appropriate;Discharge plan remains appropriate   ? ?Co-evaluation ? ? ?   ?  ?  ?  ?  ? ?  ?AM-PAC OT "6 Clicks" Daily Activity     ?Outcome Measure ? ? Help from another person eating meals?: A Lot ?Help from another person taking care of personal grooming?: A Lot ?Help from another person toileting, which includes using toliet, bedpan, or urinal?: A Lot ?Help from another person bathing (including washing, rinsing, drying)?: A Lot ?Help from another person to put on and taking off regular  upper body clothing?: A Lot ?Help from another person to put on and taking off regular lower body clothing?: Total ?6 Click Score: 11 ? ?  ?End of Session Equipment Utilized During Treatment: Gait belt;Rolling walker (2 wheels) ? ?OT Visit Diagnosis: Unsteadiness on feet (R26.81);Other abnormalities of gait and mobility (R26.89);Muscle weakness (generalized) (M62.81) ?  ?Activity Tolerance Patient tolerated treatment well ?  ?Patient Left in chair;with call bell/phone within reach;with chair alarm set ?  ?Nurse Communication Mobility status; notified RN of monitor beeping in pt's room ?  ? ?   ? ?Time: 2841-3244 ?OT Time Calculation (min): 26 min ? ?Charges: OT General Charges ?$OT Visit: 1 Visit ?OT Treatments ?$Self Care/Home Management : 8-22 mins ?$Therapeutic Activity: 8-22 mins ? ?Alfonzo Beers, OTD, OTR/L ?Acute Rehab ?[phone] ? ? ?Mayer Masker ?07/07/2021, 1:03 PM ?

## 2021-07-07 NOTE — Progress Notes (Signed)
Speech Language Pathology Treatment: Dysphagia;Cognitive-Linquistic  ?[name] ?MRN: [id number] ?DOB: 1940-06-10 ?Today's Date: 07/07/2021 ?Time: [id number] ?SLP Time Calculation (min) (ACUTE ONLY): 16 min ? ?Assessment / Plan / Recommendation ?Clinical Impression ? [name] oral swallow function is improving. Last week she had significant labial spillage and today minimal labial residue. Max feeding assist initially fading to moderate with precision of spoon to mouth. Suspect her delayed swallow as seen on MBS may be less today. She has a lingual protrusion when swallowing honey thick liquids via spoon and needs more practice to try to close mouth during swallows. Minimal residue in anterior sulcus that she was aware and swallowed to clear. She received PEG yesterday and po diet of Dys 1 and honey thick liquids was restarted today. ?She has significant dysarthria with poor intelligibility if context unknown to listener. Therapist modeled verbally and visually to increase articulatory movement and slow pace with words and phrases. Counted 1-5 and days of week with cues for increased labial/lingual movement and encouraged her to verbalize versus only gesturing with head nod/shakes to respond. ST will continue to work with pt. From SLP perspective she would be a good candidate for AIR. ?  ?HPI HPI: 81 y.o. female with medical history significant of HTN, remote stroke with residual left-sided weakness, anxiety/depression, HLD, sent by daughter for fall this afternoon.      Last known normal was around 10 AM today.  Daughter found patient on the floor this afternoon around 1 PM.  Patient obviously unable to talk but EMS arrived, EMS interviewed the daughter on scene but found little additional information.  EMS also found the patient's pill bottles not organized and some of her BP meds pill boxes full with expiration date already passed; MRI head on 06/25/21 indicated small acute infarct lateral L  thalamus; old occipital infarct.  10/09/19 MBS while inpatient revealed significant oral dysphagia. ?  ?   ?SLP Plan ? Continue with current plan of care ? ?  ?  ?Recommendations for follow up therapy are one component of a multi-disciplinary discharge planning process, led by the attending physician.  Recommendations may be updated based on patient status, additional functional criteria and insurance authorization. ?  ? ?Recommendations  ?Diet recommendations: Dysphagia 1 (puree);Honey-thick liquid ?Liquids provided via: Teaspoon ?Medication Administration: Crushed with puree ?Supervision: Staff to assist with self feeding;Full supervision/cueing for compensatory strategies ?Compensations: Minimize environmental distractions;Slow rate;Small sips/bites;Lingual sweep for clearance of pocketing ?Postural Changes and/or Swallow Maneuvers: Seated upright 90 degrees  ?   ?    ?   ? ? ? ? General recommendations: Rehab consult ?Oral Care Recommendations: Oral care BID ?Follow Up Recommendations: Acute inpatient rehab (3hours/day) ?Assistance recommended at discharge: Frequent or constant Supervision/Assistance ?SLP Visit Diagnosis: Dysphagia, oropharyngeal phase (R13.12);Dysarthria and anarthria (R47.1) ?Plan: Continue with current plan of care ? ? ? ? ?  ?  ? ? ?[name] ? ?07/07/2021, 2:08 PM ?

## 2021-07-07 NOTE — Progress Notes (Addendum)
Problem: Education: ?Goal: Knowledge of disease or condition will improve ?Outcome: Not Progressing ?Goal: Knowledge of secondary prevention will improve (SELECT ALL) ?Outcome: Not Progressing ?Goal: Knowledge of patient specific risk factors will improve (INDIVIDUALIZE FOR PATIENT) ?Outcome: Not Progressing ? ?0900 Patient's  G tube draining small amounts of fluid. MD notified during rounds. MD gave order to clamp drain. Drain clamped 1100.  ? ?1420 Patient's tray arrived in room. Patient pointed with her finger towards tray. Patient fed herself some of the pureed meat using her right hand. ?  ?

## 2021-07-07 NOTE — Discharge Summary (Signed)
?DISCHARGE SUMMARY ? ?[name] ? ?MR#: [id number] ? ?DOB:1940-12-27  ?Date of Admission: 06/24/2021 ?Date of Discharge: 07/08/2021 ? ?Attending Physician:Seham Gardenhire Hennie Duos, MD ? ?Patient's [id number], [name]., MD ? ?Consults: ?Neurology ?Palliative Care ?IR ? ?Disposition: D/C to SNF  ? ?Follow-up Appts: ? Contact information for follow-up providers   ? ? Frann Rider, NP. Schedule an appointment as soon as possible for a visit in 1 month(s).   ?Specialty: Neurology ?Why: stroke clinic ?Contact information: ?[address] ?[place] [address] ?[phone] ? ? ?  ?  ? ?  ?  ? ? Contact information for after-discharge care   ? ? Destination   ? ? [facility] .   ?Service: Skilled Nursing ?Contact information: ?[id number] ?[place] ?[phone] ? ?  ?  ? ?  ?  ? ?  ?  ? ?  ? ? ?Diet: ?Dysphagia 1 diet with honey thick liquids -full assistance supervision with all meals from a teaspoon monitoring and cueing for anterior spill and oral residue ? ?Tests Needing Follow-up: ?-Monitor oral intake and determine if tube feeding needs to be tapered to off ?-ASA and Brilinta (held for PEG placement) to be resumed in stepwise fashion, w/ ASA resuming 5/17, and Brilinta 5/18 if no evidence of bleeding  ? ?Discharge Diagnoses: ?Acute ischemic stroke  ?Possible Transient Atrial fibrillation ?Hypertensive emergency ?Dysphagia ?Malnutrition of moderate degree ?Anxiety ?NO CODE BLUE - DNR  ? ?Initial presentation: ?81yo with a history of CVA and residual left hemiparesis who presented to the ER after a fall and was found to have right hemiparesis and a left gaze preference. MRI brain in the ER revealed a small left thalamic stroke. ? ?Hospital Course: ?5/3: Admitted and antihypertensives started ?5/5: Failed modified barium, Cortrak placed ?5/8: IR and Palliative care consulted for ?PEG - decision made to place PEG ?5/16: PEG placed in IR ? ?Acute ischemic  stroke  ?-MRI brain noted a small L thalamic stroke ?-Non-invasive angiography noted severe distal left M1 stenosis, 3 x 4 mm ACOM aneurysm, and 50% stenosis of the proximal right ICA ?-TTE without cardiogenic source of embolism ?- Continue aspirin + Brilinta after PEG tube placed ?- Continue Zetia, atorvastatin ?- Continue BP control ?  ?Possible Transient Atrial fibrillation ?she has had a few very brief (1-2 seconds) runs of SVT on monitoring during this admission- do not feel full anticoag in her best interest/clearly indicated at this time ?  ?Hypertensive emergency ?Initial BP >220/120 - beta-blocker discontinued due to bradycardia -blood pressure well controlled at time of discharge ?  ?Dysphagia ?Discussed with Neurology, in the setting of her prior lacunar infarcts, this stroke appears to be causing a more substantial bulbar deficit than this particular stroke otherwise would by itself - the end result is that she has severe dysarthria and dysphagia, and it is possible she may never fully recover speech and swallowing ability - overall, however, she had a good functional status and cognitive status prior to her stroke, it is possible that these swallowing and speech deficits would be isolated and she would have a good quality of life, making PEG reasonable -during the interim required for antiplatelet medication washout the patient began to show some signs of improving swallowing ability -this was not to the point that she would be able to support herself when oral intake alone, but she did show that she was safe to enjoy some consistencies and therefore oral diet  has been advanced -it is possible that this could continue to improve in the supportive SNF environment and that eventually her PEG tube could be discontinued ?  ?Malnutrition of moderate degree ?As evidenced by mild fat depletion, moderate muscle depletion and stroke with severe dysphagia - continue tube feeds and free water as recommended by  nutrition consult -as oral intake improves can consider tapering down on dose of tube feed to allow improved appetite ?  ?Anxiety ?- Continue bupropion  ?- Continue escitalopram, dose increased here ?-Appears clinically stable at time of discharge ?  ? ?Allergies as of 07/07/2021   ? ?   Reactions  ? Atorvastatin   ? Other reaction(s): myalgias  ? Celecoxib   ? Other reaction(s): chest pain  ? Ezetimibe   ? Other reaction(s): GI side effects  ? Penicillin G   ? Other reaction(s): swelling  ? Penicillins Swelling, Other (See Comments)  ? Has patient had a PCN reaction causing immediate rash, facial/tongue/throat swelling, SOB or lightheadedness with hypotension: Yes ?Has patient had a PCN reaction causing severe rash involving mucus membranes or skin necrosis: No ?Has patient had a PCN reaction that required hospitalization: No ?Has patient had a PCN reaction occurring within the last 10 years: No ?If all of the above answers are "NO", then may proceed with Cephalosporin use.  ? Rosuvastatin   ? Other reaction(s): myalgias  ? Shellfish Allergy Itching, Swelling  ? Zoloft [sertraline] Rash  ? ?  ? ?  ?Medication List  ?  ? ?STOP taking these medications   ? ?aspirin 81 MG EC tablet ?Replaced by: aspirin 81 MG chewable tablet ?  ?atorvastatin 10 MG tablet ?Commonly known as: LIPITOR ?  ?bisacodyl 10 MG suppository ?Commonly known as: DULCOLAX ?  ?buPROPion 150 MG 24 hr tablet ?Commonly known as: WELLBUTRIN XL ?Replaced by: buPROPion 75 MG tablet ?  ?CALCIUM 600+D HIGH POTENCY PO ?  ?clopidogrel 75 MG tablet ?Commonly known as: PLAVIX ?  ?hydrALAZINE 25 MG tablet ?Commonly known as: APRESOLINE ?  ?hydrochlorothiazide 25 MG tablet ?Commonly known as: HYDRODIURIL ?  ?HYDROcodone-acetaminophen 5-325 MG tablet ?Commonly known as: NORCO/VICODIN ?  ?Medical Compression Stockings Misc ?  ?mirabegron ER 25 MG Tb24 tablet ?Commonly known as: MYRBETRIQ ?  ?pantoprazole 20 MG tablet ?Commonly known as: PROTONIX ?Replaced by:  pantoprazole sodium 40 mg ?  ?ProAir HFA 108 (90 Base) MCG/ACT inhaler ?Generic drug: albuterol ?  ?RA SALINE ENEMA RE ?  ?Vitamin D 50 MCG (2000 UT) tablet ?  ? ?  ? ?TAKE these medications   ? ?acetaminophen 160 MG/5ML solution ?Commonly known as: TYLENOL ?Place 20.3 mLs (650 mg total) into feeding tube every 4 (four) hours as needed for mild pain (or temp > 37.5 C (99.5 F)). ?  ?amLODipine 10 MG tablet ?Commonly known as: NORVASC ?Place 1 tablet (10 mg total) into feeding tube daily. ?Start taking on: Jul 08, 2021 ?What changed:  ?how to take this ?Another medication with the same name was removed. Continue taking this medication, and follow the directions you see here. ?  ?aspirin 81 MG chewable tablet ?Place 1 tablet (81 mg total) into feeding tube daily. ?Start taking on: Jul 08, 2021 ?Replaces: aspirin 81 MG EC tablet ?  ?buPROPion 75 MG tablet ?Commonly known as: WELLBUTRIN ?Place 1 tablet (75 mg total) into feeding tube 2 (two) times daily. ?Start taking on: Jul 08, 2021 ?Replaces: buPROPion 150 MG 24 hr tablet ?  ?escitalopram 20 MG tablet ?Commonly known as:  LEXAPRO ?Place 1 tablet (20 mg total) into feeding tube daily. ?Start taking on: Jul 08, 2021 ?What changed:  ?medication strength ?how much to take ?how to take this ?when to take this ?additional instructions ?  ?ezetimibe 10 MG tablet ?Commonly known as: ZETIA ?Place 1 tablet (10 mg total) into feeding tube daily. ?Start taking on: Jul 08, 2021 ?What changed: how to take this ?  ?feeding supplement (PROSource TF) liquid ?Place 45 mLs into feeding tube 2 (two) times daily. ?  ?feeding supplement (OSMOLITE 1.5 CAL) Liqd ?Place 1,000 mLs into feeding tube continuous. ?  ?fluconazole 100 MG tablet ?Commonly known as: DIFLUCAN ?Place 1 tablet (100 mg total) into feeding tube daily. ?Start taking on: Jul 08, 2021 ?  ?free water Soln ?Place 200 mLs into feeding tube every 4 (four) hours. ?  ?isosorbide-hydrALAZINE 20-37.5 MG tablet ?Commonly known as:  BIDIL ?Place 2 tablets into feeding tube 3 (three) times daily. ?  ?lisinopril 40 MG tablet ?Commonly known as: ZESTRIL ?Place 1 tablet (40 mg total) into feeding tube daily. ?Start taking on: Jul 08, 2021 ?What cha

## 2021-07-08 DIAGNOSIS — M6281 Muscle weakness (generalized): Secondary | ICD-10-CM | POA: Diagnosis not present

## 2021-07-08 DIAGNOSIS — I63532 Cerebral infarction due to unspecified occlusion or stenosis of left posterior cerebral artery: Secondary | ICD-10-CM | POA: Diagnosis not present

## 2021-07-08 DIAGNOSIS — G3184 Mild cognitive impairment, so stated: Secondary | ICD-10-CM | POA: Diagnosis not present

## 2021-07-08 DIAGNOSIS — S0990XA Unspecified injury of head, initial encounter: Secondary | ICD-10-CM | POA: Diagnosis not present

## 2021-07-08 DIAGNOSIS — F331 Major depressive disorder, recurrent, moderate: Secondary | ICD-10-CM | POA: Diagnosis not present

## 2021-07-08 DIAGNOSIS — I4891 Unspecified atrial fibrillation: Secondary | ICD-10-CM | POA: Diagnosis not present

## 2021-07-08 DIAGNOSIS — I679 Cerebrovascular disease, unspecified: Secondary | ICD-10-CM | POA: Diagnosis not present

## 2021-07-08 DIAGNOSIS — I161 Hypertensive emergency: Secondary | ICD-10-CM | POA: Diagnosis not present

## 2021-07-08 DIAGNOSIS — S0003XA Contusion of scalp, initial encounter: Secondary | ICD-10-CM | POA: Diagnosis not present

## 2021-07-08 DIAGNOSIS — F413 Other mixed anxiety disorders: Secondary | ICD-10-CM | POA: Diagnosis not present

## 2021-07-08 DIAGNOSIS — Z09 Encounter for follow-up examination after completed treatment for conditions other than malignant neoplasm: Secondary | ICD-10-CM | POA: Diagnosis not present

## 2021-07-08 DIAGNOSIS — R1312 Dysphagia, oropharyngeal phase: Secondary | ICD-10-CM | POA: Diagnosis not present

## 2021-07-08 DIAGNOSIS — G8190 Hemiplegia, unspecified affecting unspecified side: Secondary | ICD-10-CM | POA: Diagnosis not present

## 2021-07-08 DIAGNOSIS — R531 Weakness: Secondary | ICD-10-CM | POA: Diagnosis not present

## 2021-07-08 DIAGNOSIS — R2689 Other abnormalities of gait and mobility: Secondary | ICD-10-CM | POA: Diagnosis not present

## 2021-07-08 DIAGNOSIS — R1310 Dysphagia, unspecified: Secondary | ICD-10-CM | POA: Diagnosis not present

## 2021-07-08 DIAGNOSIS — F4320 Adjustment disorder, unspecified: Secondary | ICD-10-CM | POA: Diagnosis not present

## 2021-07-08 DIAGNOSIS — R41841 Cognitive communication deficit: Secondary | ICD-10-CM | POA: Diagnosis not present

## 2021-07-08 DIAGNOSIS — I10 Essential (primary) hypertension: Secondary | ICD-10-CM | POA: Diagnosis not present

## 2021-07-08 DIAGNOSIS — B370 Candidal stomatitis: Secondary | ICD-10-CM | POA: Diagnosis not present

## 2021-07-08 DIAGNOSIS — Z7401 Bed confinement status: Secondary | ICD-10-CM | POA: Diagnosis not present

## 2021-07-08 DIAGNOSIS — T1490XA Injury, unspecified, initial encounter: Secondary | ICD-10-CM | POA: Diagnosis not present

## 2021-07-08 DIAGNOSIS — W050XXA Fall from non-moving wheelchair, initial encounter: Secondary | ICD-10-CM | POA: Diagnosis not present

## 2021-07-08 DIAGNOSIS — E440 Moderate protein-calorie malnutrition: Secondary | ICD-10-CM | POA: Diagnosis not present

## 2021-07-08 DIAGNOSIS — R2681 Unsteadiness on feet: Secondary | ICD-10-CM | POA: Diagnosis not present

## 2021-07-08 DIAGNOSIS — W19XXXA Unspecified fall, initial encounter: Secondary | ICD-10-CM | POA: Diagnosis not present

## 2021-07-08 DIAGNOSIS — I639 Cerebral infarction, unspecified: Secondary | ICD-10-CM | POA: Diagnosis not present

## 2021-07-08 DIAGNOSIS — Z7982 Long term (current) use of aspirin: Secondary | ICD-10-CM | POA: Diagnosis not present

## 2021-07-08 DIAGNOSIS — I69354 Hemiplegia and hemiparesis following cerebral infarction affecting left non-dominant side: Secondary | ICD-10-CM | POA: Diagnosis not present

## 2021-07-08 DIAGNOSIS — R7301 Impaired fasting glucose: Secondary | ICD-10-CM | POA: Diagnosis not present

## 2021-07-08 DIAGNOSIS — M6282 Rhabdomyolysis: Secondary | ICD-10-CM | POA: Diagnosis not present

## 2021-07-08 DIAGNOSIS — F418 Other specified anxiety disorders: Secondary | ICD-10-CM | POA: Diagnosis not present

## 2021-07-08 DIAGNOSIS — R471 Dysarthria and anarthria: Secondary | ICD-10-CM | POA: Diagnosis not present

## 2021-07-08 DIAGNOSIS — F329 Major depressive disorder, single episode, unspecified: Secondary | ICD-10-CM | POA: Diagnosis not present

## 2021-07-08 DIAGNOSIS — Z8673 Personal history of transient ischemic attack (TIA), and cerebral infarction without residual deficits: Secondary | ICD-10-CM | POA: Diagnosis not present

## 2021-07-08 DIAGNOSIS — W19XXXS Unspecified fall, sequela: Secondary | ICD-10-CM | POA: Diagnosis not present

## 2021-07-08 DIAGNOSIS — I69391 Dysphagia following cerebral infarction: Secondary | ICD-10-CM | POA: Diagnosis not present

## 2021-07-08 LAB — CBC
HCT: 34 % — ABNORMAL LOW (ref 36.0–46.0)
Hemoglobin: 10.6 g/dL — ABNORMAL LOW (ref 12.0–15.0)
MCH: 25.6 pg — ABNORMAL LOW (ref 26.0–34.0)
MCHC: 31.2 g/dL (ref 30.0–36.0)
MCV: 82.1 fL (ref 80.0–100.0)
Platelets: 363 10*3/uL (ref 150–400)
RBC: 4.14 MIL/uL (ref 3.87–5.11)
RDW: 14 % (ref 11.5–15.5)
WBC: 6.9 10*3/uL (ref 4.0–10.5)
nRBC: 0 % (ref 0.0–0.2)

## 2021-07-08 LAB — GLUCOSE, CAPILLARY
Glucose-Capillary: 170 mg/dL — ABNORMAL HIGH (ref 70–99)
Glucose-Capillary: 144 mg/dL — ABNORMAL HIGH (ref 70–99)
Glucose-Capillary: 133 mg/dL — ABNORMAL HIGH (ref 70–99)

## 2021-07-08 MED ORDER — FREE WATER: 200.0000 mL | Freq: Four times a day (QID) | Status: AC

## 2021-07-08 MED ORDER — OSMOLITE 1.5 CAL PO LIQD: ORAL | 0 refills | Status: AC

## 2021-07-08 NOTE — Progress Notes (Addendum)
Nutrition Follow-up ? ?DOCUMENTATION CODES:  ?Non-severe (moderate) malnutrition in context of chronic illness ? ?INTERVENTION:  ?Continue current diet as ordered per SLP ?Monitor PO intake and adjust TF as needed as PO intake increases. ?Continue tube feeds at goal: ?Osmolite 1.5 at 50 ml/h (1200 ml per day) ?Prosource TF 45 ml BID ?Free water flushes of 219m Q6H per MD  ?Provides 1880 kcal, 97 gm protein, 17750mper day (TF+flush) ?When discharged, could adjust to bolus feeds for ease of care at SNF. Would recommend the following: ?300102molus feeds (1.25 carton) 4x/d of Osmolite 1.5 (or equivalent) for a total of 5 cartons/d ?Flush with 100m22m water before and after each bolus feed (200mL70md) ?2 packets of prosource TF daily (40kcal and 11g of protein per packet) ?This will provide 1880 kcal, 97g of protein, and 1714mL 5mree water (TF+flush)  ? ?NUTRITION DIAGNOSIS:  ?Moderate Malnutrition related to chronic illness (h/o stroke) as evidenced by mild fat depletion, moderate muscle depletion, mild muscle depletion. ?- ongoing ? ?GOAL:  ?Patient will meet greater than or equal to 90% of their needs ?- progressing, needs will be met with TF ? ?MONITOR:  ?PO intake, Supplement acceptance, Diet advancement, Labs, Weight trends, TF tolerance ? ?REASON FOR ASSESSMENT:  ?Consult ?Enteral/tube feeding initiation and management ? ?ASSESSMENT:  ?Pt admitted after a fall likely secondary to acute ischemic stroke. PMH significant for HTN, remote stroke with residual L-sided weakness, anxiety/depression and HLD. ? ?5/5 - MBS: SLP recommended NPO, cortrak placed (distal stomach) and TF initiated. ?5/15 - PEG placed in radiology ? ?Pt resting in bed at the time of assessment abdominal binder in place and TF infusing at goal of 50mL/h58m PEG. Breakfast tray at bedside minimally consumed. MD reports that pt will be dc to SNF today. ? ?Pt has tolerated current TF regimen well. If easier to provide nutrition at SNF, could be  changed to bolus feeding. Will place recommendations above, but can be adjusted by RD at SNF as needed.   ? ?Current TF Regimen: ?Osmolite 1.5 at 50mL/h 32m prosource TF BID  ? ?Nutritionally Relevant Medications: ?Scheduled Meds: ? ezetimibe  10 mg Per Tube Daily  ? PROSource TF  45 mL Per Tube BID  ? free water  200 mL Per Tube Q4H  ? pantoprazole sodium  40 mg Per Tube Daily  ? rosuvastatin  20 mg Per Tube Daily  ? ?Continuous Infusions: ? feeding supplement (OSMOLITE 1.5 CAL) 30 mL/hr at 07/07/21 2200  ? ?PRN Meds: bisacodyl, magnesium hydroxide, senna-docusate ? ?Labs Reviewed ? ?NUTRITION - FOCUSED PHYSICAL EXAM: ?Flowsheet Row Most Recent Value  ?Orbital Region No depletion  ?Upper Arm Region Mild depletion  ?Thoracic and Lumbar Region Mild depletion  ?Buccal Region Mild depletion  ?Temple Region No depletion  ?Clavicle Bone Region Mild depletion  ?Clavicle and Acromion Bone Region Mild depletion  ?Scapular Bone Region No depletion  ?Dorsal Hand No depletion  ?Patellar Region Moderate depletion  ?Anterior Thigh Region Moderate depletion  ?Posterior Calf Region Moderate depletion  ?Edema (RD Assessment) None  ?Hair Reviewed  ?Eyes Reviewed  ?Mouth Reviewed  ?Skin Reviewed  ?Nails Reviewed  ? ? ?Diet Order:   ?Diet Order   ? ?       ?  DIET - DYS 1 Room service appropriate? Yes; Fluid consistency: Honey Thick  Diet effective now       ?  ? ?  ?  ? ?  ? ? ?EDUCATION NEEDS:  ?No education  needs have been identified at this time ? ?Skin:  Skin Assessment: Reviewed RN Assessment ? ?Last BM:  5/16 - per RN documentation ? ?Height:  ?Ht Readings from Last 1 Encounters:  ?06/24/21 '5\' 3"'  (1.6 m)  ? ?Weight:  ?Wt Readings from Last 1 Encounters:  ?07/01/21 60.4 kg  ? ? ?Ideal Body Weight:  52.3 kg ? ?BMI:  Body mass index is 23.59 kg/m?. ? ?Estimated Nutritional Needs:  ?Kcal:  1700-1900 ?Protein:  90-105g ?Fluid:  >/=1.7L ? ? ?Ranell Patrick, RD, LDN ?Clinical Dietitian ?RD pager # available in [place]  ?After  hours/weekend pager # available in [place] ?

## 2021-07-08 NOTE — Plan of Care (Signed)
?  Problem: Education: ?Goal: Knowledge of General Education information will improve ?Description: Including pain rating scale, medication(s)/side effects and non-pharmacologic comfort measures ?Outcome: Adequate for Discharge ?  ?Problem: Health Behavior/Discharge Planning: ?Goal: Ability to manage health-related needs will improve ?Outcome: Adequate for Discharge ?  ?Problem: Clinical Measurements: ?Goal: Ability to maintain clinical measurements within normal limits will improve ?Outcome: Adequate for Discharge ?Goal: Will remain free from infection ?Outcome: Adequate for Discharge ?Goal: Diagnostic test results will improve ?Outcome: Adequate for Discharge ?Goal: Respiratory complications will improve ?Outcome: Adequate for Discharge ?Goal: Cardiovascular complication will be avoided ?Outcome: Adequate for Discharge ?  ?Problem: Activity: ?Goal: Risk for activity intolerance will decrease ?Outcome: Adequate for Discharge ?  ?Problem: Nutrition: ?Goal: Adequate nutrition will be maintained ?Outcome: Adequate for Discharge ?  ?Problem: Coping: ?Goal: Level of anxiety will decrease ?Outcome: Adequate for Discharge ?  ?Problem: Elimination: ?Goal: Will not experience complications related to bowel motility ?Outcome: Adequate for Discharge ?Goal: Will not experience complications related to urinary retention ?Outcome: Adequate for Discharge ?  ?Problem: Pain Managment: ?Goal: General experience of comfort will improve ?Outcome: Adequate for Discharge ?  ?Problem: Safety: ?Goal: Ability to remain free from injury will improve ?Outcome: Adequate for Discharge ?  ?Problem: Skin Integrity: ?Goal: Risk for impaired skin integrity will decrease ?Outcome: Adequate for Discharge ?  ?Problem: Education: ?Goal: Knowledge of disease or condition will improve ?Outcome: Adequate for Discharge ?Goal: Knowledge of secondary prevention will improve (SELECT ALL) ?Outcome: Adequate for Discharge ?Goal: Knowledge of patient specific  risk factors will improve (INDIVIDUALIZE FOR PATIENT) ?Outcome: Adequate for Discharge ?Goal: Individualized Educational Video(s) ?Outcome: Adequate for Discharge ?  ?Problem: Coping: ?Goal: Will verbalize positive feelings about self ?Outcome: Adequate for Discharge ?  ?

## 2021-07-08 NOTE — Discharge Summary (Signed)
?Physician Discharge Summary ?  ?Patient: [name] MRN: [id number] DOB: [birth year]  ?Admit date:     06/24/2021  ?Discharge date: 07/08/21  ?Discharge Physician: Edwin Dada  ? ?PCP: Ginger Organ., MD  ? ? ? ?Recommendations at discharge:  ?Follow up with Neurology Frann Rider NP in 1 month, please schedule appointment ?Care for new PEG tube: ?Apply abdominal binder for comfort only ?No follow up for PEG needed with Interventional Radiology  ?T-tacks (dissolvable sutures with button-like covering) will dissolve and fall off with time, no specific care needed ?Continue new tube feeds, consolidate to bolus schedule when able ?Resume Brilinta on 07/09/21 if no bleeding/oozing from PEG tube ?Continue Speech Therapy at rehab, for speaking and swallowing ? ? ? ? ? ?Discharge Diagnoses: ?Principal Problem: ?  Acute ischemic stroke ([place]) ?Active Problems: ?  Hypertensive emergency ?  Fall ?  Elevated lactic acid level ?  Cerebrovascular disease ?  Anxiety ?  Malnutrition of moderate degree ?  Dysphagia ?  Atrial fibrillation doubted ? ?  ? ? ?Hospital Course: ?[name] is a 81 y.o. F with hx CVA residual left hemiparesis who presented after a fall and subsequent appearance of right hemiparesis and left gaze preference. ? ?In the ER, she had facial droop, decreased responsiveness.  BP 223/165 mmHg and MRI brain showed small L thalamic stroke. ? ?5/3: Admitted and antihypertensives started ?5/5: Failed modified barium, Cortrak placed ?5/8: IR and Palliative care consulted for ?PEG ?5/16: PEG placed by Interventional Radiology ?5/17: Minor oozing from PEG, aspirin restarted, tube feeds started without difficulty ?5/18: No further oozing or bleeding ? ? ? ?  ? ? ? ?  ? ? ? ? ? ?The [facility] Controlled Substances Registry was reviewed for this patient prior to discharge.  ? ?Consultants: Neurology, IR, Palliative Care ?Disposition: SNF ?Diet recommendation: Dysphagia 1 (pureed) with nectar thickened  liquids ? ? ?DISCHARGE MEDICATION: ?Allergies as of 07/08/2021   ? ?   Reactions  ? Atorvastatin   ? Other reaction(s): myalgias  ? Celecoxib   ? Other reaction(s): chest pain  ? Ezetimibe   ? Other reaction(s): GI side effects  ? Penicillin G   ? Other reaction(s): swelling  ? Penicillins Swelling, Other (See Comments)  ? Has patient had a PCN reaction causing immediate rash, facial/tongue/throat swelling, SOB or lightheadedness with hypotension: Yes ?Has patient had a PCN reaction causing severe rash involving mucus membranes or skin necrosis: No ?Has patient had a PCN reaction that required hospitalization: No ?Has patient had a PCN reaction occurring within the last 10 years: No ?If all of the above answers are "NO", then may proceed with Cephalosporin use.  ? Rosuvastatin   ? Other reaction(s): myalgias  ? Shellfish Allergy Itching, Swelling  ? Zoloft [sertraline] Rash  ? ?  ? ?  ?Medication List  ?  ? ?STOP taking these medications   ? ?aspirin 81 MG EC tablet ?Replaced by: aspirin 81 MG chewable tablet ?  ?atorvastatin 10 MG tablet ?Commonly known as: LIPITOR ?  ?bisacodyl 10 MG suppository ?Commonly known as: DULCOLAX ?  ?buPROPion 150 MG 24 hr tablet ?Commonly known as: WELLBUTRIN XL ?Replaced by: buPROPion 75 MG tablet ?  ?CALCIUM 600+D HIGH POTENCY PO ?  ?clopidogrel 75 MG tablet ?Commonly known as: PLAVIX ?  ?hydrALAZINE 25 MG tablet ?Commonly known as: APRESOLINE ?  ?hydrochlorothiazide 25 MG tablet ?Commonly known as: HYDRODIURIL ?  ?HYDROcodone-acetaminophen 5-325 MG tablet ?Commonly known as: NORCO/VICODIN ?  ?  Medical Compression Stockings Misc ?  ?mirabegron ER 25 MG Tb24 tablet ?Commonly known as: MYRBETRIQ ?  ?pantoprazole 20 MG tablet ?Commonly known as: PROTONIX ?Replaced by: pantoprazole sodium 40 mg ?  ?ProAir HFA 108 (90 Base) MCG/ACT inhaler ?Generic drug: albuterol ?  ?RA SALINE ENEMA RE ?  ?Vitamin D 50 MCG (2000 UT) tablet ?  ? ?  ? ?TAKE these medications   ? ?acetaminophen 160 MG/5ML  solution ?Commonly known as: TYLENOL ?Place 20.3 mLs (650 mg total) into feeding tube every 4 (four) hours as needed for mild pain (or temp > 37.5 C (99.5 F)). ?  ?amLODipine 10 MG tablet ?Commonly known as: NORVASC ?Place 1 tablet (10 mg total) into feeding tube daily. ?What changed:  ?how to take this ?Another medication with the same name was removed. Continue taking this medication, and follow the directions you see here. ?  ?aspirin 81 MG chewable tablet ?Place 1 tablet (81 mg total) into feeding tube daily. ?Replaces: aspirin 81 MG EC tablet ?  ?buPROPion 75 MG tablet ?Commonly known as: WELLBUTRIN ?Place 1 tablet (75 mg total) into feeding tube 2 (two) times daily. ?Replaces: buPROPion 150 MG 24 hr tablet ?  ?escitalopram 20 MG tablet ?Commonly known as: LEXAPRO ?Place 1 tablet (20 mg total) into feeding tube daily. ?What changed:  ?medication strength ?how much to take ?how to take this ?when to take this ?additional instructions ?  ?ezetimibe 10 MG tablet ?Commonly known as: ZETIA ?Place 1 tablet (10 mg total) into feeding tube daily. ?What changed: how to take this ?  ?feeding supplement (PROSource TF) liquid ?Place 45 mLs into feeding tube 2 (two) times daily. ?  ?feeding supplement (OSMOLITE 1.5 CAL) Liqd ?Administer osmolite 1.5 at 50 ml/hr continuously.  Work with dietitian to consolidate to bolus feeding. ?  ?fluconazole 100 MG tablet ?Commonly known as: DIFLUCAN ?Place 1 tablet (100 mg total) into feeding tube daily. ?  ?free water Soln ?Place 200 mLs into feeding tube every 6 (six) hours. ?  ?isosorbide-hydrALAZINE 20-37.5 MG tablet ?Commonly known as: BIDIL ?Place 2 tablets into feeding tube 3 (three) times daily. ?  ?lisinopril 40 MG tablet ?Commonly known as: ZESTRIL ?Place 1 tablet (40 mg total) into feeding tube daily. ?What changed: how to take this ?  ?magnesium hydroxide 400 MG/5ML suspension ?Commonly known as: MILK OF MAGNESIA ?Place 30 mLs into feeding tube daily as needed for mild  constipation. ?What changed: how to take this ?  ?metoprolol tartrate 25 MG tablet ?Commonly known as: LOPRESSOR ?Place 0.5 tablets (12.5 mg total) into feeding tube 2 (two) times daily. ?What changed:  ?medication strength ?how much to take ?how to take this ?when to take this ?  ?pantoprazole sodium 40 mg ?Commonly known as: PROTONIX ?Place 40 mg into feeding tube daily. ?Replaces: pantoprazole 20 MG tablet ?  ?rosuvastatin 20 MG tablet ?Commonly known as: CRESTOR ?Place 1 tablet (20 mg total) into feeding tube daily. ?  ?senna-docusate 8.6-50 MG tablet ?Commonly known as: Senokot-S ?Place 1 tablet into feeding tube at bedtime as needed for mild constipation. ?  ?ticagrelor 90 MG Tabs tablet ?Commonly known as: BRILINTA ?Place 1 tablet (90 mg total) into feeding tube 2 (two) times daily. ?Start taking on: Jul 09, 2021 ?  ? ?  ? ? Contact information for follow-up providers   ? ? Frann Rider, NP. Schedule an appointment as soon as possible for a visit in 1 month(s).   ?Specialty: Neurology ?Why: stroke clinic ?Contact information: ?  [address] ?[place] [address] ?[phone] ? ? ?  ?  ? ?  ?  ? ? Contact information for after-discharge care   ? ? Destination   ? ? [facility] Preferred SNF .   ?Service: Skilled Nursing ?Contact information: ?[id number] ?[place] ?[phone] ? ?  ?  ? ?  ?  ? ?  ?  ? ?  ? ? ?Discharge Instructions   ? ? Ambulatory referral to Neurology   Complete by: As directed ?  ? Follow up with stroke clinic NP (Jessica Franklin or Cecille Rubin, if both not available, consider Zachery Dauer, or [place]) at [facility] in about 4 weeks. Thanks.  ? ?  ? ? ?Discharge Exam: ?Filed Weights  ? 06/29/21 0430 06/30/21 0500 07/01/21 0500  ?Weight: 61.5 kg 65.1 kg 60.4 kg  ? ? ?General: Pt is alert, awake, not in acute distress, sitting up in bed, appears weak bilaterally, has strong dysarthria, makes normal eye contact, follows commands  weakly ?Cardiovascular: RRR, nl S1-S2, no murmurs appreciated.   No LE edema.   ?Respiratory: Normal respiratory rate and rhythm.  CTAB without rales or wheezes. ?Abdominal: Abdomen soft and non-tender.  No distension o

## 2021-07-08 NOTE — TOC Transition Note (Signed)
Transition of Care (TOC) - CM/SW Discharge Note ? ? ?Patient Details  ?Name: [name] ?MRN: [id number] ?Date of Birth: [birth year] ? ?Transition of Care [facility]) CM/SW Contact:  ?Baldemar Lenis, LCSW ?Phone Number: ?07/08/2021, 11:31 AM ? ? ?Clinical Narrative:   CSW notified by MD that patient is medically stable for discharge to SNF. CSW confirmed with [facility] that bed is available and authorization received. CSW notified daughter, [name], via phone. Transport scheduled with [facility] for next available. ? ?Nurse to call report to [phone], Room 213. ? ? ? ?Final next level of care: Skilled Nursing Facility ?Barriers to Discharge: Barriers Resolved ? ? ?Patient Goals and CMS Choice ?Patient states their goals for this hospitalization and ongoing recovery are:: patient unable to participate in goal setting, not oriented ?CMS Medicare.gov Compare Post Acute Care list provided to:: Patient Represenative (must comment) ?Choice offered to / list presented to : Adult Children ? ?Discharge Placement ?  ?           ?Patient chooses bed at: [facility] ?Patient to be transferred to facility by: [facility] ?Name of family member notified: [name] ?Patient and family notified of of transfer: 07/08/21 ? ?Discharge Plan and Services ?  ?  ?Post Acute Care Choice: Skilled Nursing Facility          ?  ?  ?  ?  ?  ?  ?  ?  ?  ?  ? ?Social Determinants of Health (SDOH) Interventions ?  ? ? ?Readmission Risk Interventions ? ?  10/09/2019  ? 10:55 AM  ?Readmission Risk Prevention Plan  ?Post Dischage Appt Not Complete  ?Appt Comments rec for SNF  ?Medication Screening Complete  ?Transportation Screening Complete  ? ? ? ? ? ?

## 2021-07-08 NOTE — Progress Notes (Signed)
1410 Patient's IV removed. Patient discharged to SNF via transportation by stretcher EMS.  ?

## 2021-07-09 ENCOUNTER — Non-Acute Institutional Stay (SKILLED_NURSING_FACILITY): Payer: Medicare HMO | Admitting: Adult Health

## 2021-07-09 ENCOUNTER — Encounter: Payer: Self-pay | Admitting: Adult Health

## 2021-07-09 DIAGNOSIS — Z8673 Personal history of transient ischemic attack (TIA), and cerebral infarction without residual deficits: Secondary | ICD-10-CM | POA: Diagnosis not present

## 2021-07-09 DIAGNOSIS — I10 Essential (primary) hypertension: Secondary | ICD-10-CM

## 2021-07-09 DIAGNOSIS — I4891 Unspecified atrial fibrillation: Secondary | ICD-10-CM

## 2021-07-09 DIAGNOSIS — R1310 Dysphagia, unspecified: Secondary | ICD-10-CM | POA: Diagnosis not present

## 2021-07-09 DIAGNOSIS — B370 Candidal stomatitis: Secondary | ICD-10-CM

## 2021-07-09 DIAGNOSIS — F418 Other specified anxiety disorders: Secondary | ICD-10-CM | POA: Diagnosis not present

## 2021-07-09 NOTE — Progress Notes (Signed)
Location:  [facility] Room Number: 2123/A Place of Service:  SNF (31) Provider:  Durenda Age, DNP, FNP-BC  Patient Care Team: Ginger Organ., MD as PCP - General (Internal Medicine)  Extended Emergency Contact Information Primary Emergency Contact: [place] Phone: [phone] Mobile Phone: [phone] Relation: Sister Preferred language: English Interpreter needed? No Secondary Emergency Contact: McCollum,[name] Address: [place], [place] Mobile Phone: [phone] Relation: Daughter  Code Status:  DNR  Goals of care: Advanced Directive information    07/09/2021   11:02 AM  Advanced Directives  Does Patient Have a Medical Advance Directive? Yes  Type of Advance Directive Out of facility DNR (pink MOST or yellow form)  Does patient want to make changes to medical advance directive? No - Patient declined  Pre-existing out of facility DNR order (yellow form or pink MOST form) Pink MOST form placed in chart (order not valid for inpatient use)     Chief Complaint  Patient presents with   Hospitalization Follow-up    Follow up hospitalization 06/24/21 to 07/08/21    HPI:  Pt is a 81 y.o. female who was admitted to [facility] on 07/08/21 post hospital admission 06/24/21 to 07/08/21.  She has a PMH of hypertension, remote stroke with residual left-sided weakness, anxiety/depression and hyperlipidemia. Daughter found patient on the floor and unable to talk.  EMS was called and brought patient to the hospital. In the ED, SBP > 220, has significant right-sided weakness, left facial droop and left leg leg.  MRI brain showed small left thalamic stroke.  She was started on antihypertensives.  She failed modified barium, Cortrak placed.  IR and palliative care were consulted for PEG placement.  PEG placement done on 5/16.  She was seen in her room today. Noted to have  severe  dysarthria. She follows directions. She was noted to be walking using a walker with assistance.   Past Medical History:  Diagnosis Date   Arthritis    Depression    High cholesterol    Hypertension    Pneumonia    Stroke ([place])    1994 no residuals   Past Surgical History:  Procedure Laterality Date   ABDOMINAL HYSTERECTOMY     catheterization of the left superficial femoral artery  10/20/2009   IR GASTROSTOMY TUBE MOD SED  07/06/2021   SHOULDER ARTHROSCOPY WITH ROTATOR CUFF REPAIR AND SUBACROMIAL DECOMPRESSION Left 01/27/2018   Procedure: LEFT SHOULDER ARTHROSCOPY WITH DEBRIDEMENT, SUBACROMIAL DECOMPRESSION AND ROTATOR CUFF REPAIR;  Surgeon: Mcarthur Rossetti, MD;  Location: [facility];  Service: Orthopedics;  Laterality: Left;   SHOULDER SURGERY     Left with dr. Felicie Morn arthroscopy with possible rotator cuff repair   TONSILLECTOMY      Allergies  Allergen Reactions   Atorvastatin     Other reaction(s): myalgias   Celecoxib     Other reaction(s): chest pain   Ezetimibe     Other reaction(s): GI side effects   Penicillin G     Other reaction(s): swelling   Penicillins Swelling and Other (See Comments)    Has patient had a PCN reaction causing immediate rash, facial/tongue/throat swelling, SOB or lightheadedness with hypotension: Yes Has patient had a PCN reaction causing severe rash involving mucus membranes or skin necrosis: No Has patient had a PCN reaction that required hospitalization: No Has patient had a PCN reaction occurring within the last 10 years:  No If all of the above answers are "NO", then may proceed with Cephalosporin use.    Rosuvastatin     Other reaction(s): myalgias   Shellfish Allergy Itching and Swelling   Zoloft [Sertraline] Rash    Outpatient Encounter Medications as of 07/09/2021  Medication Sig   acetaminophen (TYLENOL) 160 MG/5ML solution Place 20.3 mLs (650 mg total) into feeding tube every 4 (four) hours as needed for mild pain  (or temp > 37.5 C (99.5 F)).   amLODipine (NORVASC) 10 MG tablet Place 1 tablet (10 mg total) into feeding tube daily.   aspirin 81 MG chewable tablet Place 1 tablet (81 mg total) into feeding tube daily.   buPROPion (WELLBUTRIN) 75 MG tablet Place 1 tablet (75 mg total) into feeding tube 2 (two) times daily.   escitalopram (LEXAPRO) 20 MG tablet Place 1 tablet (20 mg total) into feeding tube daily.   ezetimibe (ZETIA) 10 MG tablet Place 1 tablet (10 mg total) into feeding tube daily.   fluconazole (DIFLUCAN) 100 MG tablet Place 1 tablet (100 mg total) into feeding tube daily.   isosorbide-hydrALAZINE (BIDIL) 20-37.5 MG tablet Place 2 tablets into feeding tube 3 (three) times daily.   lisinopril (ZESTRIL) 40 MG tablet Place 1 tablet (40 mg total) into feeding tube daily.   magnesium hydroxide (MILK OF MAGNESIA) 400 MG/5ML suspension Place 30 mLs into feeding tube daily as needed for mild constipation.   metoprolol tartrate (LOPRESSOR) 25 MG tablet Place 0.5 tablets (12.5 mg total) into feeding tube 2 (two) times daily.   Nutritional Supplements (FEEDING SUPPLEMENT, OSMOLITE 1.5 CAL,) LIQD Administer osmolite 1.5 at 50 ml/hr continuously.  Work with dietitian to consolidate to bolus feeding.   Nutritional Supplements (FEEDING SUPPLEMENT, PROSOURCE TF,) liquid Place 45 mLs into feeding tube 2 (two) times daily.   pantoprazole sodium (PROTONIX) 40 mg Place 40 mg into feeding tube daily.   rosuvastatin (CRESTOR) 20 MG tablet Place 1 tablet (20 mg total) into feeding tube daily.   senna-docusate (SENOKOT-S) 8.6-50 MG tablet Place 1 tablet into feeding tube at bedtime as needed for mild constipation.   ticagrelor (BRILINTA) 90 MG TABS tablet Place 1 tablet (90 mg total) into feeding tube 2 (two) times daily.   Water For Irrigation, Sterile (FREE WATER) SOLN Place 200 mLs into feeding tube every 6 (six) hours.   No facility-administered encounter medications on file as of 07/09/2021.    Review of  Systems   Unable to obtain due to dysarthria.    Immunization History  Administered Date(s) Administered   Influenza, High Dose Seasonal PF 11/09/2016, 12/01/2018   PFIZER(Purple Top)SARS-COV-2 Vaccination 04/28/2019, 05/18/2019   Pneumococcal Polysaccharide-23 05/24/1998   Td 03/26/1999   Pertinent  Health Maintenance Due  Topic Date Due   DEXA SCAN  Never done   INFLUENZA VACCINE  09/22/2021      07/05/2021   10:42 PM 07/06/2021   12:00 PM 07/06/2021   10:00 PM 07/07/2021    8:00 PM 07/08/2021    9:10 AM  Fall Risk  Patient Fall Risk Level High fall risk High fall risk High fall risk High fall risk High fall risk     Vitals:   07/09/21 1101  BP: 123/68  Pulse: 71  Resp: 19  Temp: (!) 97.5 F (36.4 C)  SpO2: 98%  Weight: 135 lb (61.2 kg)  Height: 5\' 3"  (1.6 m)   Body mass index is 23.91 kg/m.  Physical Exam Constitutional:      General: She is  not in acute distress.    Appearance: Normal appearance.  HENT:     Head: Normocephalic and atraumatic.     Nose: Nose normal.     Mouth/Throat:     Mouth: Mucous membranes are moist.     Comments: Tongue with whitish coating Eyes:     Conjunctiva/sclera: Conjunctivae normal.  Cardiovascular:     Rate and Rhythm: Normal rate and regular rhythm.  Pulmonary:     Effort: Pulmonary effort is normal.     Breath sounds: Normal breath sounds.  Abdominal:     General: Bowel sounds are normal.     Palpations: Abdomen is soft.     Comments: Has peg tube  Musculoskeletal:     Comments: Able to move X 4 extremities.  Skin:    General: Skin is warm and dry.  Neurological:     Comments: Has severe dysarthria  Psychiatric:        Mood and Affect: Mood normal.        Behavior: Behavior normal.       Labs reviewed: Recent Labs    06/26/21 1854 06/27/21 0446 06/29/21 0235 07/01/21 0231 07/04/21 0057 07/06/21 0306  NA  --   --    < > 136 135 136  K  --   --    < > 3.6 3.9 4.2  CL  --   --    < > 103 101 102  CO2   --   --    < > 25 27 28   GLUCOSE  --   --    < > 95 98 97  BUN  --   --    < > 26* 37* 20  CREATININE  --   --    < > 0.89 1.08* 0.78  CALCIUM  --   --    < > 9.6 9.5 9.5  MG 2.1 2.1  --   --  2.2  --   PHOS 4.3 4.0  --   --  5.0*  --    < > = values in this interval not displayed.   Recent Labs    06/24/21 1453 07/01/21 0231  AST 16 23  ALT 6 12  ALKPHOS 49 42  BILITOT 1.1 1.4*  PROT 6.9 6.9  ALBUMIN 4.0 3.9   Recent Labs    07/05/21 2326 07/07/21 1038 07/08/21 0309  WBC 5.8 8.1 6.9  HGB 10.7* 11.3* 10.6*  HCT 34.6* 36.5 34.0*  MCV 83.2 81.1 82.1  PLT 306 351 363   Lab Results  Component Value Date   TSH 1.337 07/01/2021   Lab Results  Component Value Date   HGBA1C 5.3 06/26/2021   Lab Results  Component Value Date   CHOL 132 06/25/2021   HDL 50 06/25/2021   LDLCALC 70 06/25/2021   TRIG 61 06/25/2021   CHOLHDL 2.6 06/25/2021    Significant Diagnostic Results in last 30 days:  CT ABDOMEN WO CONTRAST  Result Date: 06/28/2021 CLINICAL DATA:  Preoperative exam prior to gastrostomy tube placement EXAM: CT ABDOMEN WITHOUT CONTRAST TECHNIQUE: Multidetector CT imaging of the abdomen was performed following the standard protocol without IV contrast. RADIATION DOSE REDUCTION: This exam was performed according to the departmental dose-optimization program which includes automated exposure control, adjustment of the mA and/or kV according to patient size and/or use of iterative reconstruction technique. COMPARISON:  01/14/2019 FINDINGS: Lower chest: Included lung bases are clear. Heart size within normal limits. Extensive coronary artery calcification. Hepatobiliary: No  focal liver abnormality evident on unenhanced CT. Sludge and small stones are seen layering within the gallbladder lumen. No obvious inflammatory changes by CT. Pancreas: Unremarkable. No pancreatic ductal dilatation or surrounding inflammatory changes. Spleen: Normal in size without focal abnormality.  Adrenals/Urinary Tract: Adrenal glands within normal limits. No solid renal mass is evident. No hydronephrosis. Stomach/Bowel: Enteric tube terminates within the distal stomach. Distal gastric body abuts the anterior abdominal wall to the left of midline. More laterally, there are interposed loops of colon between the stomach and abdominal wall. No dilated loops of bowel. Enteric contrast is seen within the right colon. Vascular/Lymphatic: Advanced atherosclerotic calcifications of the aortoiliac axis without evidence of aneurysm. Other: No free air or free fluid within the abdomen. Musculoskeletal: Small amount of air within the subcutaneous soft tissues of the right anterior abdominal wall, which may be injection related. Mild anasarca. A mild inferior endplate compression fracture of T12 is new from 2020, however appears to be subacute or chronic. IMPRESSION: 1. Distal gastric body abuts the anterior abdominal wall to the left of midline. More laterally, there are interposed loops of colon between the stomach and abdominal wall. 2. A mild inferior endplate compression fracture of T12 is new from 2020, however appears to be subacute or chronic. 3. Sludge and small stones are seen layering within the gallbladder lumen. No obvious inflammatory changes by CT. Aortic Atherosclerosis (ICD10-I70.0). Electronically Signed   By: Davina Poke D.O.   On: 06/28/2021 14:56   CT ANGIO HEAD NECK W WO CM  Result Date: 06/24/2021 CLINICAL DATA:  Stroke follow-up EXAM: CT ANGIOGRAPHY HEAD AND NECK TECHNIQUE: Multidetector CT imaging of the head and neck was performed using the standard protocol during bolus administration of intravenous contrast. Multiplanar CT image reconstructions and MIPs were obtained to evaluate the vascular anatomy. Carotid stenosis measurements (when applicable) are obtained utilizing NASCET criteria, using the distal internal carotid diameter as the denominator. RADIATION DOSE REDUCTION: This exam  was performed according to the departmental dose-optimization program which includes automated exposure control, adjustment of the mA and/or kV according to patient size and/or use of iterative reconstruction technique. CONTRAST:  75mL OMNIPAQUE IOHEXOL 350 MG/ML SOLN COMPARISON:  10/08/2019 MRA head and neck FINDINGS: CTA NECK FINDINGS SKELETON: There is no bony spinal canal stenosis. No lytic or blastic lesion. OTHER NECK: Normal pharynx, larynx and major salivary glands. No cervical lymphadenopathy. Unremarkable thyroid gland. UPPER CHEST: No pneumothorax or pleural effusion. No nodules or masses. AORTIC ARCH: There is calcific atherosclerosis of the aortic arch. There is no aneurysm, dissection or hemodynamically significant stenosis of the visualized portion of the aorta. Conventional 3 vessel aortic branching pattern. The visualized proximal subclavian arteries are widely patent. RIGHT CAROTID SYSTEM: No dissection, occlusion or aneurysm. There is calcified atherosclerosis extending into the proximal ICA, resulting in 50% stenosis. LEFT CAROTID SYSTEM: No dissection, occlusion or aneurysm. Mild atherosclerotic calcification at the carotid bifurcation without hemodynamically significant stenosis. VERTEBRAL ARTERIES: Left dominant configuration. Both origins are clearly patent. There is no dissection, occlusion or flow-limiting stenosis to the skull base (V1-V3 segments). CTA HEAD FINDINGS POSTERIOR CIRCULATION: --Vertebral arteries: Normal V4 segments. --Inferior cerebellar arteries: Normal. --Basilar artery: Normal. --Superior cerebellar arteries: Normal. --Posterior cerebral arteries (PCA): Normal. ANTERIOR CIRCULATION: --Intracranial internal carotid arteries: Atherosclerotic calcification of the internal carotid arteries at the skull base without hemodynamically significant stenosis. --Anterior cerebral arteries (ACA): Superiorly projecting A-comm aneurysm measures 3 x 4 mm. --Middle cerebral arteries  (MCA): Severe stenosis of the distal left M1 segment.  VENOUS SINUSES: As permitted by contrast timing, patent. ANATOMIC VARIANTS: None Review of the MIP images confirms the above findings. IMPRESSION: 1. No emergent large vessel occlusion. 2. Severe stenosis of the distal left M1 segment. 3. 3 x 4 mm A-comm aneurysm. 4. 50% stenosis of the proximal right ICA secondary to calcified atherosclerosis. Aortic Atherosclerosis (ICD10-I70.0). Electronically Signed   By: Ulyses Jarred M.D.   On: 06/24/2021 22:25   DG Chest 2 View  Result Date: 06/24/2021 CLINICAL DATA:  Sepsis EXAM: CHEST - 2 VIEW COMPARISON:  02/29/2020 FINDINGS: Frontal and lateral views of the chest demonstrate a stable mildly enlarged cardiac silhouette. No airspace disease, effusion, or pneumothorax. No acute bony abnormalities. IMPRESSION: 1. No acute intrathoracic process. Electronically Signed   By: Randa Ngo M.D.   On: 06/24/2021 16:02   DG Pelvis 1-2 Views  Result Date: 06/24/2021 CLINICAL DATA:  [place], weakness, sepsis EXAM: PELVIS - 1-2 VIEW COMPARISON:  02/29/2020 FINDINGS: Supine frontal view of the pelvis excludes portions of the left greater trochanter by collimation. There are no acute displaced fractures. Stable bilateral hip osteoarthritis. Alignment is anatomic. Sacroiliac joints are normal. IMPRESSION: 1. Stable bilateral hip osteoarthritis. No acute displaced fracture. Electronically Signed   By: Randa Ngo M.D.   On: 06/24/2021 16:03   CT HEAD WO CONTRAST (5MM)  Result Date: 06/30/2021 CLINICAL DATA:  Altered mental status EXAM: CT HEAD WITHOUT CONTRAST TECHNIQUE: Contiguous axial images were obtained from the base of the skull through the vertex without intravenous contrast. RADIATION DOSE REDUCTION: This exam was performed according to the departmental dose-optimization program which includes automated exposure control, adjustment of the mA and/or kV according to patient size and/or use of iterative reconstruction  technique. COMPARISON:  06/24/2021 FINDINGS: Brain: Normal anatomic configuration. Parenchymal volume loss is commensurate with the patient's age. Moderate periventricular white matter changes are present likely reflecting the sequela of small vessel ischemia. Numerous remote lacunar infarcts are noted within the periventricular white matter, basal ganglia, caudate nuclei, and thalami bilaterally, as well as the right pons, stable since prior examination. Remote cortical infarct within the right occipital lobe again noted. No abnormal intra or extra-axial mass lesion or fluid collection. No abnormal mass effect or midline shift. No evidence of acute intracranial hemorrhage or infarct. Ventricular size is normal. Cerebellum unremarkable. Vascular: No asymmetric hyperdense vasculature at the skull base. Skull: Intact Sinuses/Orbits: Paranasal sinuses are clear. Orbits are unremarkable. Other: Mastoid air cells and middle ear cavities are clear. IMPRESSION: No acute intracranial abnormality. Moderate senescent change, stable since prior examination. Numerous remote lacunar infarcts and right occipital cortical infarct, stable since prior examination. Electronically Signed   By: Fidela Salisbury M.D.   On: 06/30/2021 20:53   CT Head Wo Contrast  Result Date: 06/24/2021 CLINICAL DATA:  Neuro deficit, acute, stroke suspected EXAM: CT HEAD WITHOUT CONTRAST TECHNIQUE: Contiguous axial images were obtained from the base of the skull through the vertex without intravenous contrast. RADIATION DOSE REDUCTION: This exam was performed according to the departmental dose-optimization program which includes automated exposure control, adjustment of the mA and/or kV according to patient size and/or use of iterative reconstruction technique. COMPARISON:  January 2022 FINDINGS: Brain: There is no acute intracranial hemorrhage, mass effect, or edema. No new loss of gray-white differentiation. Multiple chronic infarcts are identified  including involvement of right occipital lobe, bilateral central white matter and deep gray nuclei, and right cerebellum. Additional patchy and confluent hypoattenuation in the supratentorial white matter probably reflects stable chronic microvascular ischemic  changes. Ventricles and sulci are stable in size and configuration. No extra-axial collection. Vascular: There is atherosclerotic calcification at the skull base. Skull: Calvarium is unremarkable. Sinuses/Orbits: No acute finding. Other: None. IMPRESSION: No acute intracranial abnormality. Stable chronic/nonemergent findings detailed above. Electronically Signed   By: Macy Mis M.D.   On: 06/24/2021 15:23   CT Cervical Spine Wo Contrast  Result Date: 06/24/2021 CLINICAL DATA:  Fall today. EXAM: CT CERVICAL SPINE WITHOUT CONTRAST TECHNIQUE: Multidetector CT imaging of the cervical spine was performed without intravenous contrast. Multiplanar CT image reconstructions were also generated. RADIATION DOSE REDUCTION: This exam was performed according to the departmental dose-optimization program which includes automated exposure control, adjustment of the mA and/or kV according to patient size and/or use of iterative reconstruction technique. COMPARISON:  CT cervical spine 02/29/2020. MRI cervical spine 09/27/2015 FINDINGS: Alignment: Normal Skull base and vertebrae: Negative for fracture Soft tissues and spinal canal: Atherosclerotic calcification in the carotid artery bilaterally. Right thyroid nodule 2.3 cm. Disc levels: Multilevel disc and facet degeneration. Facet degeneration most prominent on the right C2 through C6. Disc degeneration and spurring most prominent at C5-6 with spinal and foraminal stenosis similar to the prior study. Upper chest: Lung apices clear bilaterally. Other: None IMPRESSION: Cervical spondylosis.  Negative for fracture. Electronically Signed   By: Franchot Gallo M.D.   On: 06/24/2021 16:25   MR BRAIN WO CONTRAST  Result  Date: 06/25/2021 CLINICAL DATA:  Delirium and right-sided weakness EXAM: MRI HEAD WITHOUT CONTRAST TECHNIQUE: Multiplanar, multiecho pulse sequences of the brain and surrounding structures were obtained without intravenous contrast. COMPARISON:  03/01/2020 FINDINGS: Brain: Small acute infarct of the lateral left thalamus, in close proximity to the posterior limb of the internal capsule. There is an old right occipital infarct. Multiple chronic microhemorrhages in a predominantly central distribution. There is multifocal hyperintense T2-weighted signal within the white matter. Generalized cerebral volume loss. The midline structures are normal. Vascular: Major flow voids are preserved. Skull and upper cervical spine: Normal calvarium and skull base. Visualized upper cervical spine and soft tissues are normal. Sinuses/Orbits:No paranasal sinus fluid levels or advanced mucosal thickening. No mastoid or middle ear effusion. Normal orbits. IMPRESSION: 1. Small acute infarct of the lateral left thalamus, in close proximity to the posterior limb of the internal capsule. No hemorrhage or mass effect. 2. Old right occipital infarct and findings of chronic small vessel disease. 3. Numerous chronic microhemorrhages in a pattern most consistent with hypertensive angiopathy. Electronically Signed   By: Ulyses Jarred M.D.   On: 06/25/2021 00:39   IR GASTROSTOMY TUBE MOD SED  Result Date: 07/07/2021 INDICATION: 81 year old female with severe dysphagia status post stroke presenting for percutaneous gastrostomy tube placement. EXAM: PERC PLACEMENT GASTROSTOMY MEDICATIONS: Vancomycin 1 gm IV; Antibiotics were administered within 1 hour of the procedure. ANESTHESIA/SEDATION: Versed 0.5 mg IV; Fentanyl 25 mcg IV Moderate Sedation Time:  15 The patient was continuously monitored during the procedure by the interventional radiology nurse under my direct supervision. CONTRAST:  97mL OMNIPAQUE IOHEXOL 300 MG/ML SOLN - administered into  the gastric lumen. FLUOROSCOPY TIME:  Fluoroscopy Time: 0.6 minutes (2 mGy). COMPLICATIONS: None immediate. PROCEDURE: Informed written consent was obtained from the patient after a thorough discussion of the procedural risks, benefits and alternatives. All questions were addressed. Maximal Sterile barrier Technique was utilized including caps, mask, sterile gowns, sterile gloves, sterile drape, hand hygiene and skin antiseptic. A timeout was performed prior to the initiation of the procedure. The patient was placed on the procedure  table in the supine position. Pre-procedure abdominal film confirmed visualization of the transverse colon. The patient was prepped and draped in usual sterile fashion. The stomach was insufflated with air via the indwelling nasogastric tube. Under fluoroscopy, a puncture site was selected and local analgesia achieved with 1% lidocaine infiltrated subcutaneously. Under fluoroscopic guidance, a gastropexy needle was passed into the stomach and the T-bar suture was released. Entry into the stomach was confirmed with fluoroscopy, aspiration of air, and injection of contrast material. This was repeated with an additional gastropexy suture (for a total of 2 fasteners). At the center of these gastropexy sutures, a dermatotomy was performed. An 18 gauge needle was passed into the stomach at the site of this dermatotomy, and position within the gastric lumen again confirmed under fluoroscopy using aspiration of air and contrast injection. An Amplatz guidewire was passed through this needle and intraluminal placement within the stomach was confirmed by fluoroscopy. The needle was removed. Over the guidewire, the percutaneous tract was dilated using a 10 mm non-compliant balloon. The balloon was deflated, then pushed into the gastric lumen followed in concert by the 20 Fr gastrostomy tube. The retention balloon of the percutaneous gastrostomy tube was inflated with 20 mL of sterile water. The tube  was withdrawn until the retention balloon was at the edge of the gastric lumen. The external bumper was brought to the abdominal wall. Contrast was injected through the gastrostomy tube, confirming intraluminal positioning. The patient tolerated the procedure well without any immediate post-procedural complications. IMPRESSION: Technically successful placement of 20 Fr gastrostomy tube. Ruthann Cancer, MD [facility] Electronically Signed   By: Ruthann Cancer M.D.   On: 07/07/2021 12:41   DG Abd Portable 1V  Result Date: 06/26/2021 CLINICAL DATA:  Feeding tube EXAM: PORTABLE ABDOMEN - 1 VIEW COMPARISON:  03/01/2020 FINDINGS: Enteric tube tip overlies the distal stomach. Gas pattern is unobstructed with mild diffuse increased bowel gas. Moderate stool in the colon. Ovoid left mid abdominal calcifications are without change IMPRESSION: Enteric tube tip overlies the distal stomach. Electronically Signed   By: Donavan Foil M.D.   On: 06/26/2021 15:16   DG Swallowing Func-Speech Pathology  Result Date: 07/03/2021 Table formatting from the original result was not included. Objective Swallowing Evaluation: Type of Study: MBS-Modified Barium Swallow Study  Patient Details Name: DANILEE LEVIE MRN: [id number] Date of Birth: 1940-07-02 Today's Date: 07/03/2021 Time: SLP Start Time (ACUTE ONLY): 44 -SLP Stop Time (ACUTE ONLY): [id number] SLP Time Calculation (min) (ACUTE ONLY): 17 min Past Medical History: Past Medical History: Diagnosis Date  Arthritis   Depression   High cholesterol   Hypertension   Pneumonia   Stroke ([place])   1994 no residuals Past Surgical History: Past Surgical History: Procedure Laterality Date  ABDOMINAL HYSTERECTOMY    catheterization of the left superficial femoral artery  10/20/2009  SHOULDER ARTHROSCOPY WITH ROTATOR CUFF REPAIR AND SUBACROMIAL DECOMPRESSION Left 01/27/2018  Procedure: LEFT SHOULDER ARTHROSCOPY WITH DEBRIDEMENT, SUBACROMIAL  DECOMPRESSION AND ROTATOR CUFF REPAIR;  Surgeon: Mcarthur Rossetti, MD;  Location: [facility];  Service: Orthopedics;  Laterality: Left;  SHOULDER SURGERY    Left with dr. Felicie Morn arthroscopy with possible rotator cuff repair  TONSILLECTOMY   HPI: 81 y.o. female with medical history significant of HTN, remote stroke with residual left-sided weakness, anxiety/depression, HLD, sent by daughter for fall this afternoon.      Last known normal was around 10 AM today.  Daughter found patient on the floor this afternoon  around 1 PM.  Patient obviously unable to talk but EMS arrived, EMS interviewed the daughter on scene but found little additional information.  EMS also found the patient's pill bottles not organized and some of her BP meds pill boxes full with expiration date already passed; MRI head on 06/25/21 indicated small acute infarct lateral L thalamus; old occipital infarct.  10/09/19 MBS while inpatient revealed significant oral dysphagia.  Subjective: Patient awake and alert and attentive  Recommendations for follow up therapy are one component of a multi-disciplinary discharge planning process, led by the attending physician.  Recommendations may be updated based on patient status, additional functional criteria and insurance authorization. Assessment / Plan / Recommendation   07/03/2021   3:02 PM Clinical Impressions Clinical Impression Pt's pharyngeal swallow function has improved from prior study 5/5 with only one instance of aspiration and no other airway intrusion present. Her oral dysphagia continues to be quite significant marked by decreased bolus control and cohesion leading to right anterior spill, anterior sulcus and lingual residue, delayed bolus formation and transit. From pharyngeal standpoint, her onset of laryngeal swallow was delayed across all textures mostly to her pyriform sinuses. SHe had less episodes of aspiraiton before the swallow than previously (thin and nectar) and one out of four  penetrated with nectar before the swallow and was silently aspirated during. No compensatory strategies were attempted given her current level of cognitive impairments. There was no pharyngeal residue. Recommend FULL assist and supervision of Dys 1 (puree), honey thick liquids from a teaspoon monitoring and cueing for anterior spill and oral residue. Can crush pills in applesauce or if excessive spill, RN may give via Cortrak. Consider decreased tube feeds to promote hunger if appropriate. ST will continue to follow SLP Visit Diagnosis Dysphagia, oropharyngeal phase (R13.12);Cognitive communication deficit (R41.841) Impact on safety and function Moderate aspiration risk     07/03/2021   3:02 PM Treatment Recommendations Treatment Recommendations Therapy as outlined in treatment plan below     07/03/2021   3:02 PM Prognosis Prognosis for Safe Diet Advancement -- Barriers to Reach Goals Cognitive deficits   07/03/2021   3:02 PM Diet Recommendations SLP Diet Recommendations Dysphagia 1 (Puree) solids;Honey thick liquids Liquid Administration via Spoon Medication Administration Crushed with puree Compensations Minimize environmental distractions;Slow rate;Small sips/bites;Lingual sweep for clearance of pocketing;Clear throat intermittently;Monitor for anterior loss Postural Changes Seated upright at 90 degrees     07/03/2021   3:02 PM Other Recommendations Oral Care Recommendations Oral care BID Follow Up Recommendations Skilled nursing-short term rehab (<3 hours/day) Assistance recommended at discharge Frequent or constant Supervision/Assistance Functional Status Assessment Patient has had a recent decline in their functional status and demonstrates the ability to make significant improvements in function in a reasonable and predictable amount of time.   07/03/2021   3:02 PM Frequency and Duration  Speech Therapy Frequency (ACUTE ONLY) min 2x/week Treatment Duration 2 weeks     07/03/2021   3:02 PM Oral Phase Oral Phase  Impaired Oral - Honey Teaspoon Right anterior bolus loss;Weak lingual manipulation;Lingual/palatal residue;Delayed oral transit;Decreased bolus cohesion;Reduced posterior propulsion Oral - Honey Cup Right anterior bolus loss;Weak lingual manipulation;Lingual/palatal residue;Delayed oral transit;Decreased bolus cohesion;Reduced posterior propulsion Oral - Thin Teaspoon Right anterior bolus loss;Weak lingual manipulation;Lingual/palatal residue;Delayed oral transit;Decreased bolus cohesion;Reduced posterior propulsion Oral - Thin Cup NT Oral - Puree Right anterior bolus loss;Weak lingual manipulation;Lingual/palatal residue;Delayed oral transit;Decreased bolus cohesion;Reduced posterior propulsion    07/03/2021   3:02 PM Pharyngeal Phase Pharyngeal Phase Impaired Pharyngeal- Honey Teaspoon Delayed  swallow initiation-pyriform sinuses Pharyngeal- Honey Cup Delayed swallow initiation-pyriform sinuses Pharyngeal- Nectar Teaspoon Delayed swallow initiation-pyriform sinuses;Penetration/Aspiration before swallow;Penetration/Aspiration during swallow Pharyngeal Material enters airway, passes BELOW cords without attempt by patient to eject out (silent aspiration) Pharyngeal- Thin Teaspoon Delayed swallow initiation-pyriform sinuses Pharyngeal- Thin Cup NT Pharyngeal- Puree Delayed swallow initiation-vallecula    07/03/2021   3:02 PM Cervical Esophageal Phase  Cervical Esophageal Phase WFL [place] Siren 07/03/2021, 3:37 PM                     DG Swallowing Func-Speech Pathology  Result Date: 06/26/2021 Table formatting from the original result was not included. Images from the original result were not included. Objective Swallowing Evaluation: Type of Study: MBS-Modified Barium Swallow Study  Patient Details Name: ROTASHA GILL MRN: [id number] Date of Birth: 27-Aug-1940 Today's Date: 06/26/2021 Time: SLP Start Time (ACUTE ONLY): [id number] -SLP Stop Time (ACUTE ONLY): [id number] SLP Time Calculation (min) (ACUTE ONLY): 20 min Past Medical  History: Past Medical History: Diagnosis Date  Arthritis   Depression   High cholesterol   Hypertension   Pneumonia   Stroke ([place])   1994 no residuals Past Surgical History: Past Surgical History: Procedure Laterality Date  ABDOMINAL HYSTERECTOMY    catheterization of the left superficial femoral artery  10/20/2009  SHOULDER ARTHROSCOPY WITH ROTATOR CUFF REPAIR AND SUBACROMIAL DECOMPRESSION Left 01/27/2018  Procedure: LEFT SHOULDER ARTHROSCOPY WITH DEBRIDEMENT, SUBACROMIAL DECOMPRESSION AND ROTATOR CUFF REPAIR;  Surgeon: Mcarthur Rossetti, MD;  Location: [facility];  Service: Orthopedics;  Laterality: Left;  SHOULDER SURGERY    Left with dr. Felicie Morn arthroscopy with possible rotator cuff repair  TONSILLECTOMY   HPI: 81 y.o. female with medical history significant of HTN, remote stroke with residual left-sided weakness, anxiety/depression, HLD, sent by daughter for fall this afternoon.      Last known normal was around 10 AM today.  Daughter found patient on the floor this afternoon around 1 PM.  Patient obviously unable to talk but EMS arrived, EMS interviewed the daughter on scene but found little additional information.  EMS also found the patient's pill bottles not organized and some of her BP meds pill boxes full with expiration date already passed; MRI head on 06/25/21 indicated small acute infarct lateral L thalamus; old occipital infarct.  10/09/19 MBS while inpatient revealed significant oral dysphagia.  Subjective: Pt able to arouse and participate for BSE;overall delayed processing/dysarthric  Recommendations for follow up therapy are one component of a multi-disciplinary discharge planning process, led by the attending physician.  Recommendations may be updated based on patient status, additional functional criteria and insurance authorization. Assessment / Plan / Recommendation   06/26/2021  12:00 PM Clinical Impressions Clinical Impression Pt presents with a severe oral, moderate pharyngeal phase dysphagia  marked by significant sensori-motor deficits or the oral phase.  She has difficulty organizing a bolus, leading to spillage throughout the oral cavity, anteriorly out of the mouth and posteriorly over the base of tongue.  With thin and nectar thick liquids, materials spilled quickly into the airway before laryngeal closure. Coughing led to ejection of part of aspirate and immediate re-aspiration of same material.  Purees were swallowed with no spillage into larynx, but they required significant effort on pt's part to move them into pharynx from oral cavity.  At the end of the study, puree residue had to be manually cleaned from mouth.  Of note, pt's swallow function was similar after stroke in 2021.  Recommend consideration of NPO status and  cortrak for now.  SLP will follow for therapeutic feeds of purees and swallowing therapy. RN may give meds crushed in puree. Please provide vigilant oral care. SLP Visit Diagnosis Dysphagia, oropharyngeal phase (R13.12) Impact on safety and function Severe aspiration risk     06/26/2021  12:00 PM Treatment Recommendations Treatment Recommendations Therapy as outlined in treatment plan below     06/26/2021  12:00 PM Prognosis Prognosis for Safe Diet Advancement Good   06/26/2021  12:00 PM Diet Recommendations SLP Diet Recommendations NPO except meds Medication Administration Crushed with puree     06/26/2021  12:00 PM Other Recommendations Oral Care Recommendations Oral care QID Follow Up Recommendations Acute inpatient rehab (3hours/day) Assistance recommended at discharge Frequent or constant Supervision/Assistance Functional Status Assessment Patient has had a recent decline in their functional status and demonstrates the ability to make significant improvements in function in a reasonable and predictable amount of time.   06/26/2021  12:00 PM Frequency and Duration  Speech Therapy Frequency (ACUTE ONLY) min 2x/week Treatment Duration 2 weeks     06/26/2021  12:00 PM Oral Phase Oral Phase  Impaired Oral - Nectar Teaspoon Left anterior bolus loss;Right anterior bolus loss;Weak lingual manipulation;Incomplete tongue to palate contact;Reduced posterior propulsion;Holding of bolus;Pocketing in anterior sulcus;Delayed oral transit;Decreased bolus cohesion;Premature spillage Oral - Thin Cup Left anterior bolus loss;Right anterior bolus loss;Weak lingual manipulation;Incomplete tongue to palate contact;Reduced posterior propulsion;Holding of bolus;Pocketing in anterior sulcus;Delayed oral transit;Decreased bolus cohesion;Premature spillage Oral - Puree Left anterior bolus loss;Right anterior bolus loss;Weak lingual manipulation;Incomplete tongue to palate contact;Reduced posterior propulsion;Holding of bolus;Pocketing in anterior sulcus;Delayed oral transit;Decreased bolus cohesion;Premature spillage    06/26/2021  12:00 PM Pharyngeal Phase Pharyngeal Phase Impaired Pharyngeal- Nectar Teaspoon Delayed swallow initiation-pyriform sinuses;Reduced pharyngeal peristalsis;Reduced epiglottic inversion;Reduced airway/laryngeal closure;Reduced tongue base retraction;Penetration/Aspiration before swallow;Trace aspiration Pharyngeal Material enters airway, passes BELOW cords and not ejected out despite cough attempt by patient Pharyngeal- Thin Cup Delayed swallow initiation-pyriform sinuses;Reduced pharyngeal peristalsis;Reduced epiglottic inversion;Reduced airway/laryngeal closure;Reduced tongue base retraction;Penetration/Aspiration before swallow;Moderate aspiration Pharyngeal Material enters airway, passes BELOW cords and not ejected out despite cough attempt by patient Pharyngeal- Puree Delayed swallow initiation-pyriform sinuses;Delayed swallow initiation-vallecula;Reduced epiglottic inversion;Pharyngeal residue - valleculae;Pharyngeal residue - pyriform     View : No data to display.    [name] 06/26/2021, 1:25 PM                     ECHOCARDIOGRAM COMPLETE  Result Date: 06/25/2021     ECHOCARDIOGRAM REPORT   Patient Name:   Richardine E Mozley Date of Exam: 06/25/2021 Medical Rec #:  [id number]   Height:       63.0 in Accession #:    [id number]  Weight:       160.0 lb Date of Birth:  May 15, 1940   BSA:          1.759 m Patient Age:    11 years    BP:           181/93 mmHg Patient Gender: F           HR:           77 bpm. Exam Location:  Inpatient Procedure: 2D Echo, Cardiac Doppler and Color Doppler Indications:    CVA  History:        Patient has prior history of Echocardiogram examinations, most                 recent 03/01/2020. Stroke; Risk Factors:Dyslipidemia and  Hypertension.  Sonographer:    [place] Referring Phys: [id number] [place]  1. Left ventricular ejection fraction, by estimation, is 60 to 65%. The left ventricle has normal function. The left ventricle has no regional wall motion abnormalities. There is severe left ventricular hypertrophy. Left ventricular diastolic parameters  are consistent with Grade I diastolic dysfunction (impaired relaxation).  2. Right ventricular systolic function is normal. The right ventricular size is normal. Tricuspid regurgitation signal is inadequate for assessing PA pressure.  3. Left atrial size was severely dilated.  4. No evidence of mitral valve regurgitation. Moderate mitral annular calcification.  5. The aortic valve is normal in structure. Aortic valve regurgitation is not visualized.  6. The inferior vena cava is normal in size with greater than 50% respiratory variability, suggesting right atrial pressure of 3 mmHg. Comparison(s): No significant change from prior study. FINDINGS  Left Ventricle: Left ventricular ejection fraction, by estimation, is 60 to 65%. The left ventricle has normal function. The left ventricle has no regional wall motion abnormalities. The left ventricular internal cavity size was normal in size. There is  severe left ventricular hypertrophy. Left ventricular diastolic parameters are  consistent with Grade I diastolic dysfunction (impaired relaxation). Right Ventricle: The right ventricular size is normal. No increase in right ventricular wall thickness. Right ventricular systolic function is normal. Tricuspid regurgitation signal is inadequate for assessing PA pressure. Left Atrium: Left atrial size was severely dilated. Right Atrium: Right atrial size was normal in size. Pericardium: There is no evidence of pericardial effusion. Mitral Valve: Moderate mitral annular calcification. No evidence of mitral valve regurgitation. Tricuspid Valve: The tricuspid valve is normal in structure. Tricuspid valve regurgitation is not demonstrated. Aortic Valve: The aortic valve is normal in structure. Aortic valve regurgitation is not visualized. Aortic valve mean gradient measures 5.0 mmHg. Aortic valve peak gradient measures 10.0 mmHg. Aortic valve area, by VTI measures 2.84 cm. Pulmonic Valve: The pulmonic valve was not assessed. Aorta: The aortic root and ascending aorta are structurally normal, with no evidence of dilitation. Venous: The inferior vena cava is normal in size with greater than 50% respiratory variability, suggesting right atrial pressure of 3 mmHg. IAS/Shunts: The interatrial septum is aneurysmal.  LEFT VENTRICLE PLAX 2D LVIDd:         4.29 cm     Diastology LVIDs:         2.03 cm     LV e' medial:    3.21 cm/s LV PW:         1.71 cm     LV E/e' medial:  12.4 LV IVS:        1.55 cm     LV e' lateral:   3.32 cm/s LVOT diam:     2.10 cm     LV E/e' lateral: 12.0 LV SV:         83 LV SV Index:   47 LVOT Area:     3.46 cm  LV Volumes (MOD) LV vol d, MOD A2C: 40.7 ml LV vol d, MOD A4C: 47.4 ml LV vol s, MOD A2C: 18.5 ml LV vol s, MOD A4C: 19.4 ml LV SV MOD A2C:     22.2 ml LV SV MOD A4C:     47.4 ml LV SV MOD BP:      25.0 ml RIGHT VENTRICLE RV S prime:     14.60 cm/s TAPSE (M-mode): 2.8 cm LEFT ATRIUM  Index LA diam:        3.40 cm 1.93 cm/m LA Vol (A2C):   82.1 ml 46.68 ml/m LA  Vol (A4C):   87.2 ml 49.58 ml/m LA Biplane Vol: 88.1 ml 50.09 ml/m  AORTIC VALVE                     PULMONIC VALVE AV Area (Vmax):    2.72 cm      PV Vmax:       1.21 m/s AV Area (Vmean):   2.49 cm      PV Peak grad:  5.9 mmHg AV Area (VTI):     2.84 cm AV Vmax:           158.00 cm/s AV Vmean:          102.000 cm/s AV VTI:            0.294 m AV Peak Grad:      10.0 mmHg AV Mean Grad:      5.0 mmHg LVOT Vmax:         124.00 cm/s LVOT Vmean:        73.400 cm/s LVOT VTI:          0.241 m LVOT/AV VTI ratio: 0.82  AORTA Ao Root diam: 3.40 cm Ao Asc diam:  3.40 cm MITRAL VALVE MV Area (PHT): 3.93 cm    SHUNTS MV Decel Time: 193 msec    Systemic VTI:  0.24 m MV E velocity: 39.80 cm/s  Systemic Diam: 2.10 cm MV A velocity: 81.40 cm/s MV E/A ratio:  0.49 Landscape architect signed by Phineas Inches Signature Date/Time: 06/25/2021/12:11:30 PM    Final     Assessment/Plan  1. History of CVA (cerebrovascular accident) -   MRI brain noted a small left thalamic stroke -   Continue aspirin, Brilinta, Zetia and atorvastatin -   For PT, OT and ST, for therapeutic interventions -    Follow-up with neurology on 08/13/2021  2. Oral candida -   Continue fluconazole x14 days -    Oral care daily  3. Primary hypertension -   Continue metoprolol to tartrate, isosorbide-hydralazine and lisinopril -    Monitor BPs  4. Dysphagia, unspecified type -   Continue PEG tube feedings of Osmolite 1.550 mL/hour via PEG tube -    Aspiration precautions -    Oral care daily  5. Atrial fibrillation, unspecified type (HCC) -   Continue metoprolol tartrate -    Monitor HRs  6. Anxiety with depression -   Mood is a stable, continue escitalopram and bupropion -    Psych consult    Family/ staff Communication: Discussed plan of care with resident and charge nurse.  Labs/tests ordered:   CBC with differentials and BMP with GFR in 1 week    Durenda Age, DNP, MSN, FNP-BC [place] [phone] (Monday-Friday 8:00 a.m. - 5:00 p.m.) [phone] (after hours)

## 2021-07-10 ENCOUNTER — Encounter: Payer: Self-pay | Admitting: Internal Medicine

## 2021-07-10 ENCOUNTER — Non-Acute Institutional Stay (SKILLED_NURSING_FACILITY): Payer: Medicare HMO | Admitting: Internal Medicine

## 2021-07-10 DIAGNOSIS — R1310 Dysphagia, unspecified: Secondary | ICD-10-CM

## 2021-07-10 DIAGNOSIS — R7301 Impaired fasting glucose: Secondary | ICD-10-CM | POA: Diagnosis not present

## 2021-07-10 DIAGNOSIS — I639 Cerebral infarction, unspecified: Secondary | ICD-10-CM | POA: Diagnosis not present

## 2021-07-10 NOTE — Progress Notes (Signed)
NURSING HOME LOCATION:  [facility] ROOM NUMBER:  [id number]  CODE STATUS:  DNR  PCP:  Lutricia Feil MD  This is a comprehensive admission note to this SNFperformed on this date less than 30 days from date of admission. Included are preadmission medical/surgical history; reconciled medication list; family history; social history and comprehensive review of systems.  Corrections and additions to the records were documented. Comprehensive physical exam was also performed. Additionally a clinical summary was entered for each active diagnosis pertinent to this admission in the Problem List to enhance continuity of care.  HPI: Patient was hospitalized 5/3 - 07/08/2021, presenting after a fall associated with right hemiparesis and left gaze preference.  In the ED facial droop and decreased responsiveness were documented.  Blood pressure was 223/165.  MRI revealed small left thalamic stroke.  Noninvasive angiography revealed severe distal left M1 stenosis, 3 x 4 mm ACOM aneurysm, and 50% stenosis in the proximal right ICA.  Echo revealed no cardiogenic source of embolism.  She had very few brief (1-2 seconds) runs of SVT on monitor.  Cardiac event monitoring 1 month post discharge was recommended.  Aspirin and Brilinta were initiated with continuation of Zetia and atorvastatin. Antihypertensive regimen was intensified. On 5/5 she failed modified barium swallow and Cortrak was placed.  IR placed PEG on 5/16.  This was complicated by minor oozing on the 17th without progression.  Aspirin was restarted and tube feeds started without complication. Oozing and bleeding resolved as of 5/18. Initial H/H was 11.8/39 with a value of 10.6/34 predischarge.  AKI was present with a nadir creatinine of 1.08 and GFR 53 indicating CKD stage IIIa.  Final values were 0.78 with a GFR greater than 60 indicating CKD stage II.  Glucoses while hospitalized ranged from a low of 82 up to 170.  A1c was prediabetic at  5.3%. Palliative care consulted and DNR designation was established.  She was discharged to the SNF for rehab.  Past medical and surgical history: Includes dyslipidemia, essential hypertension, prior history of stroke in 1994, history of depression, and degenerative joint disease. Surgeries and procedures include abdominal hysterectomy and shoulder surgery.  Social history: Nondrinker; former smoker, quitting in 2019.  Family history:extensive hx reviewed   Review of systems: Could not be completed due to profound dysarthria and dysphagia.  Physical exam:  Pertinent or positive findings: She was initially asleep; there was no evidence of hypopnea or apnea or snoring.  As noted her speech is garbled and unintelligible.  There is marked decrease in the left nasolabial fold.  Hirsutism is noted over the chin.  First heart sound is increased.  Breath sounds are decreased.  She is wearing a binder over the PEG tube site.  Pedal pulses are decreased but the posterior tibial pulses are stronger than dorsalis pedis pulses.  Right upper extremity is stronger than left upper extremity.  Any difference in strength to opposition in the lower extremities disease is subtle.  General appearance: Adequately nourished; no acute distress, increased work of breathing is present.   Lymphatic: No lymphadenopathy about the head, neck, axilla. Eyes: No conjunctival inflammation or lid edema is present. There is no scleral icterus. Ears:  External ear exam shows no significant lesions or deformities.   Nose:  External nasal examination shows no deformity or inflammation. Nasal mucosa are pink and moist without lesions, exudates Oral exam: Lips and gums are healthy appearing. Neck:  No thyromegaly, masses, tenderness noted.    Heart:  Normal  rate and regular rhythm. S2 normal without gallop, murmur, click, rub.  Lungs:  without wheezes, rhonchi, rales, rubs. Abdomen: Bowel sounds are normal.  Abdomen is soft and  nontender with no organomegaly, hernias, masses. GU: Deferred  Extremities:  No cyanosis, clubbing, edema. Neurologic exam:  Balance, Rhomberg, finger to nose testing could not be completed due to clinical state Skin: Warm & dry w/o tenting. No significant lesions or rash.  See clinical summary under each active problem in the Problem List with associated updated therapeutic plan

## 2021-07-10 NOTE — Assessment & Plan Note (Signed)
Event monitor to be considered 1 month post discharge.

## 2021-07-10 NOTE — Patient Instructions (Signed)
See assessment and plan under each diagnosis in the problem list and acutely for this visit 

## 2021-07-10 NOTE — Assessment & Plan Note (Signed)
Glucoses while hospitalized with thalamic stroke ranged from a low of 82 up to 170.  A1c prediabetic at 5.3%.  No change indicated.

## 2021-07-10 NOTE — Assessment & Plan Note (Signed)
SLP will follow at the SNF.  Wound care nurse will monitor PEG site.

## 2021-07-22 DIAGNOSIS — G3184 Mild cognitive impairment, so stated: Secondary | ICD-10-CM | POA: Diagnosis not present

## 2021-07-22 DIAGNOSIS — I10 Essential (primary) hypertension: Secondary | ICD-10-CM | POA: Diagnosis not present

## 2021-08-12 DIAGNOSIS — M6281 Muscle weakness (generalized): Secondary | ICD-10-CM | POA: Diagnosis not present

## 2021-08-12 DIAGNOSIS — I69354 Hemiplegia and hemiparesis following cerebral infarction affecting left non-dominant side: Secondary | ICD-10-CM | POA: Diagnosis not present

## 2021-08-12 DIAGNOSIS — R2681 Unsteadiness on feet: Secondary | ICD-10-CM | POA: Diagnosis not present

## 2021-08-12 DIAGNOSIS — I639 Cerebral infarction, unspecified: Secondary | ICD-10-CM | POA: Diagnosis not present

## 2021-08-12 DIAGNOSIS — I69391 Dysphagia following cerebral infarction: Secondary | ICD-10-CM | POA: Diagnosis not present

## 2021-08-12 DIAGNOSIS — R471 Dysarthria and anarthria: Secondary | ICD-10-CM | POA: Diagnosis not present

## 2021-08-12 DIAGNOSIS — F329 Major depressive disorder, single episode, unspecified: Secondary | ICD-10-CM | POA: Diagnosis not present

## 2021-08-12 NOTE — Progress Notes (Signed)
[facility] [address] [place]. [place] [address] [phone]       HOSPITAL FOLLOW UP NOTE  [name] Date of Birth:  [birth year] Medical Record Number:  [id number]   Reason for Referral:  hospital stroke follow up    SUBJECTIVE:   CHIEF COMPLAINT:  Chief Complaint  Patient presents with   Follow-up    Rm 3 with sister here for hospital follow up, Res[facility] at [facility]. Sister reports pt is doing fairly well. She reports the pt continues to have issues with her speech. Pt is difficult of hearing.     HPI:   Update 08/12/2021 JM: Patient returns for hospital follow-up after recent stroke.  Previously seen 12/2019 for hospital stroke follow-up and did not follow-up after that time.  She presented to ED on 06/24/2021 after a fall at home and on evaluation, noted right-sided weakness and left gaze preference.  Evaluated by Dr. Roda Shutters with stroke work-up revealing left PLIC infarct likely secondary to small vessel disease given location vs large vessel disease given left M2 high-grade stenosis as seen on CTA.  In addition to acute strokes, MRI also showed numerous chronic microhemorrhages.  EF 60 to 65%.  LDL 70.  A1c 5.4.  On aspirin and Plavix PTA and recommended aspirin and Brilinta for 30 days then back to aspirin and Plavix.  Increased home dose atorvastatin from 10 mg to 40 mg daily and continuation of Zetia 10 mg daily although prior to discharge, she was placed on Crestor 20 mg daily in addition to Zetia.  Presented with hypertensive urgency eventually stabilized with adjustments to medication regimen and advised avoiding low BP given him to high-grade stenosis and long-term BP goal normotensive range.  Mention of possible transient A-fib with very brief run of SVT on monitoring, did not feel AC in best interest or indicated at that time.  In setting of recent stroke and prior lacunar strokes, had significant dysarthria and dysphagia and concern of  limited speech or swallowing recovery.  Evaluated by palliative care and proceeded with PEG placement on 5/15.  Due to limited functional mobility, therapies recommended SNF for residual dysarthria, dysphagia, b/l LE>UE weakness and functional decline and was discharged on 5/17 to [facility] living SNF.   She is accompanied by her sister who provides majority of history although information/knowledge limited.  Since discharge, sister reports she has been doing "fairly well".  She continues to reside at [facility] rehab. Working with PT/OT/SLP.  Has been making progress in regards to her strength and has been ambulating with therapy short distance with rolling walker.  He continues to have speech impairment although is able to say some simple words.  Continues to have swallowing difficulties. Per SNF MAR, PEG remains in place currently receiving tube feeds twice daily and for 10 hours overnight as well as all medications.  Ate some food by mouth but has been pocketing food per sister.  No new stroke/TIA symptoms.  Per SNF MAR, remains on both aspirin and Brilinta as well as Crestor and Zetia.  Blood pressure today 111/73.  Neither patient or sister have any specific concerns today.     PERTINENT IMAGING/LABS: Per hospitalization 06/24/2021 CT head No acute abnormality.  CTA head & neck No LVO, severe stenosis of left M2 segments, 3x4 mm acom aneurysm, 50% stenosis of right ICA MRI  small acute infarct of left PLIC, numerous chronic microhemorrhages, small vessel disease 2D Echo EF 60-65%, severe LVH, grade 1  diastolic dysfunction, severely dilated left atrium, aneurysmal interatrial septum LDL 70 HgbA1c 5.4     History provided for reference purposes only Initial visit 12/25/2019 JM: Ms. Mino is being seen for hospital follow-up unaccompanied. She has since returned home from SNF and is living independently.  She reports continued gait impairment and imbalance with frequent falls due to  loss of balance which is usually worse when going from sitting to standing.  She was working with [facility] PT but has not had visit in over the past couple of weeks.  She is able to manage ADLs independently but does admit to having difficulty with IADLs such as medication administration, bill paying, heavy cleaning and complex cooking.  She has been driving short distances.  Denies residual swallowing difficulties but has been eating softer foods as this is " easier".  Denies new or worsening stroke/TIA symptoms.  She reports currently being on aspirin 81 mg daily despite recommended Plavix after completion of 3-week DAPT but denies bleeding or bruising.  Remains on atorvastatin 10 mg daily without myalgias.  Blood pressure today 147/79 with patient reporting continuation of metoprolol, lisinopril, hydrochlorothiazide, hydralazine and amlodipine per PCP.  No further concerns at this time.  Stroke admission 10/08/2019 Ms. JOYCELYNN FRITSCHE is a 81 y.o. female with history of HTN, HLD, prior history of stroke in 1994 with no residual deficits who presented on 10/08/2019 with L sided weakness.  Stroke work-up revealed a large right corona radiata infarct likely embolic secondary to unknown source.  MRA showed proximal right ICA 50% stenosis and 4 mm ACA aneurysm.  Carotid Doppler showed bilateral ICA 40 to 59% stenosis.  Recommended 30-day cardiac event monitor to assess for possible atrial fibrillation and stroke etiology.  Recommended DAPT for 3 weeks then Plavix alone.  HTN stable.  LDL 94 and initiated atorvastatin 40 mg daily.  Other stroke risk factors include prior stroke history, advanced age, tobacco use, PAD s/p angioplasty and stent 2007.  Other active problems include cognitive impairment at baseline, dehydration, hypokalemia, fall, GERD and depression.  Residual deficits of dysphagia, left-sided weakness, gait impairment, and imbalance.  Evaluated by therapies who recommended discharge to SNF for ongoing therapy  needs.  Stroke: Large R corona radiata infarct likely embolic secondary to unknown source CT head No acute abnormality.  MRI large R caudate infarct. Multiple old small vessel disease infarcts. MRA head & neck no ELVO, proximal R ICA 50% stenosis. 42mm ACA aneurysm Carotid Doppler  B ICA 40 to 59% stenosis, VAs antegrade 2D Echo  EF 60 to 70%, normal LV function and size, possible intraarterial septal aneurysm LDL 94 HgbA1c 5.4 VTE prophylaxis - Heparin 5000 units sq tid  aspirin 81 mg daily prior to admission, now on aspirin 300 mg suppository daily. Plan DAPT x 3 weeks once able to swallow.   Therapy recommendations:  SNF Disposition:   [facility]     ROS:   N/A d/t speech deficit  PMH:  Past Medical History:  Diagnosis Date   Arthritis    Depression    High cholesterol    Hypertension    Pneumonia    Stroke (HCC)    1994 no residuals    PSH:  Past Surgical History:  Procedure Laterality Date   ABDOMINAL HYSTERECTOMY     catheterization of the left superficial femoral artery  10/20/2009   IR GASTROSTOMY TUBE MOD SED  07/06/2021   SHOULDER ARTHROSCOPY WITH ROTATOR CUFF REPAIR AND SUBACROMIAL DECOMPRESSION Left 01/27/2018  Procedure: LEFT SHOULDER ARTHROSCOPY WITH DEBRIDEMENT, SUBACROMIAL DECOMPRESSION AND ROTATOR CUFF REPAIR;  Surgeon: Mcarthur Rossetti, MD;  Location: [facility];  Service: Orthopedics;  Laterality: Left;   SHOULDER SURGERY     Left with dr. Felicie Morn arthroscopy with possible rotator cuff repair   TONSILLECTOMY      Social History:  Social History   Socioeconomic History   Marital status: Widowed    Spouse name: Not on file   Number of children: Not on file   Years of education: Not on file   Highest education level: Not on file  Occupational History   Not on file  Tobacco Use   Smoking status: Former    Packs/day: 0.50    Types: Cigarettes    Quit date: 12/17/2017    Years since quitting: 3.6   Smokeless tobacco:  Never   Tobacco comments:    using nic patches  Vaping Use   Vaping Use: Never used  Substance and Sexual Activity   Alcohol use: Not Currently   Drug use: No   Sexual activity: Not Currently  Other Topics Concern   Not on file  Social History Narrative   Not on file   Social Determinants of Health   Financial Resource Strain: Not on file  Food Insecurity: Not on file  Transportation Needs: Not on file  Physical Activity: Not on file  Stress: Not on file  Social Connections: Not on file  Intimate Partner Violence: Not on file    Family History:  Family History  Problem Relation Age of Onset   Cancer Mother    Diabetes Mother    Heart disease Mother    Hypertension Mother    Heart attack Mother    Hyperlipidemia Mother    Hypertension Father    Diabetes Sister    Heart disease Sister    Hyperlipidemia Sister    Hypertension Sister    Cancer Sister    Diabetes Brother    Heart disease Brother    Hypertension Brother    Cancer Brother    Hyperlipidemia Brother    Heart disease Son    Heart attack Son        amputation    Medications:   Current Outpatient Medications on File Prior to Visit  Medication Sig Dispense Refill   acetaminophen (TYLENOL) 160 MG/5ML solution Place 20.3 mLs (650 mg total) into feeding tube every 4 (four) hours as needed for mild pain (or temp > 37.5 C (99.5 F)). 120 mL 0   amLODipine (NORVASC) 10 MG tablet Place 1 tablet (10 mg total) into feeding tube daily.     aspirin 81 MG chewable tablet Place 1 tablet (81 mg total) into feeding tube daily.     buPROPion (WELLBUTRIN) 75 MG tablet Place 1 tablet (75 mg total) into feeding tube 2 (two) times daily.     escitalopram (LEXAPRO) 20 MG tablet Place 1 tablet (20 mg total) into feeding tube daily.     ezetimibe (ZETIA) 10 MG tablet Place 1 tablet (10 mg total) into feeding tube daily.     fluconazole (DIFLUCAN) 100 MG tablet Place 1 tablet (100 mg total) into feeding tube daily.      isosorbide-hydrALAZINE (BIDIL) 20-37.5 MG tablet Place 2 tablets into feeding tube 3 (three) times daily.     lisinopril (ZESTRIL) 40 MG tablet Place 1 tablet (40 mg total) into feeding tube daily.     magnesium hydroxide (MILK OF MAGNESIA) 400 MG/5ML suspension Place 30 mLs into  feeding tube daily as needed for mild constipation. 355 mL 0   metoprolol tartrate (LOPRESSOR) 25 MG tablet Place 0.5 tablets (12.5 mg total) into feeding tube 2 (two) times daily.     Nutritional Supplements (FEEDING SUPPLEMENT, OSMOLITE 1.5 CAL,) LIQD Administer osmolite 1.5 at 50 ml/hr continuously.  Work with dietitian to consolidate to bolus feeding.  0   Nutritional Supplements (FEEDING SUPPLEMENT, PROSOURCE TF,) liquid Place 45 mLs into feeding tube 2 (two) times daily.     pantoprazole sodium (PROTONIX) 40 mg Place 40 mg into feeding tube daily.     rosuvastatin (CRESTOR) 20 MG tablet Place 1 tablet (20 mg total) into feeding tube daily.     senna-docusate (SENOKOT-S) 8.6-50 MG tablet Place 1 tablet into feeding tube at bedtime as needed for mild constipation.     ticagrelor (BRILINTA) 90 MG TABS tablet Place 1 tablet (90 mg total) into feeding tube 2 (two) times daily. 60 tablet    Water For Irrigation, Sterile (FREE WATER) SOLN Place 200 mLs into feeding tube every 6 (six) hours.     No current facility-administered medications on file prior to visit.    Allergies:   Allergies  Allergen Reactions   Atorvastatin     Other reaction(s): myalgias   Celecoxib     Other reaction(s): chest pain   Ezetimibe     Other reaction(s): GI side effects   Penicillin G     Other reaction(s): swelling   Penicillins Swelling and Other (See Comments)    Has patient had a PCN reaction causing immediate rash, facial/tongue/throat swelling, SOB or lightheadedness with hypotension: Yes Has patient had a PCN reaction causing severe rash involving mucus membranes or skin necrosis: No Has patient had a PCN reaction that required  hospitalization: No Has patient had a PCN reaction occurring within the last 10 years: No If all of the above answers are "NO", then may proceed with Cephalosporin use.    Rosuvastatin     Other reaction(s): myalgias   Shellfish Allergy Itching and Swelling   Zoloft [Sertraline] Rash      OBJECTIVE:  Physical Exam  Vitals:   08/13/21 0903  BP: 111/73  Pulse: (!) 53   There is no height or weight on file to calculate BMI. No results found.   General: well developed, well nourished, pleasant elderly African-American female, seated, in no evident distress Head: head normocephalic and atraumatic.   Neck: supple with no carotid or supraclavicular bruits Cardiovascular: regular rate and rhythm, no murmurs Musculoskeletal: no deformity Skin:  no rash/petichiae Vascular:  Normal pulses all extremities   Neurologic Exam Mental Status: Awake and fully alert. severe dysarthria - able to state name, month, year, and yes/no although speech slurred, no other intelligible speech. Delayed responses.  Oriented to place and time.  Difficulty assessing full extent of cognition due to speech deficit.  Follows commands without difficulty.  Mood and affect flat.  Cranial Nerves: Pupils equal, briskly reactive to light. Extraocular movements full without nystagmus. Visual fields full to confrontation. Hearing intact. Facial sensation intact. Left lower facial weakness. tongue, palate moves normally and symmetrically.  Motor: BUE: 5/5 proximal and mild decreased grip strength BLE: 4/5 HF otherwise 4+/5 Sensory.: intact to touch , pinprick , position and vibratory sensation.  Coordination: Rapid alternating movements slowed in extremities. Finger-to-nose and heel-to-shin unable to assess due to difficulty understanding directions. Gait and Station: Deferred Reflexes: 1+ and symmetric. Toes downgoing.     NIHSS  5 Modified Rankin  3-4     ASSESSMENT/PLAN: PERLENE SIGLEY is a 81 y.o. year old  female with left PLIC infarct [id number] likely secondary to small vessel disease given location vs large vessel disease given left M2 high-grade stenosis. Hx of large right CR infarct on [id number] embolic secondary to unknown source.  Vascular risk factors include HTN, HLD, stroke history, tobacco use, PAD s/p angioplasty and stent 2007, carotid stenosis and cerebral aneurysm.     L PLIC stroke [id number] R corona radiata infarct, cryptogenic 09/2019:  Residual deficit: BLE weakness, mild BUE weakness, severe dysarthria and dysphagia with current use of PEG tube.  Continue [facility] with [facility] at [facility].  Did discuss typical recovery time but this may be limited due to prior strokes.  Completed 30 days of aspirin and Brilinta therapy - advised to resume back to PTA dose of aspirin and Plavix as well as continuation of Crestor and Zetia for secondary stroke prevention measures and with hx of PAD Close PCP follow up for aggressive stroke risk factor management including BP goal<130/90, and HLD with LDL goal<70 Stroke labs 06/2021: LDL 70, A1c 5.4  Carotid stenosis:  Cerebral aneurysm CTA head/neck 06/2021:  Right ICA 50% stenosis  A-comm aneurysm 3 x 4 mm MRA head/neck 09/2019 Right ICA 50% stenosis A-comm aneurysm 4 mm Repeat imaging around 06/2022 for surveillance monitoring      Follow up in 4 months or call earlier if needed  CC:  [facility] provider: Dr. Dolores Lory, [name]., MD    I spent 57 minutes of face-to-face and non-face-to-face time with patient and sister.  This included previsit chart review including recent hospitalization pertinent progress notes, lab work and imaging, lab review, study review, order entry, electronic health record documentation, patient and sister education regarding recent stroke including etiology, residual deficits, importance of managing stroke risk factors and aggressive stroke risk factor management and answered all questions to patient and sisters  satisfaction   Frann Rider, AGNP-BC  [facility] [address] [place], [place] [address]  Phone [phone] Fax [phone] Note: This document was prepared with digital dictation and possible smart phrase technology. Any transcriptional errors that result from this process are unintentional.

## 2021-08-13 ENCOUNTER — Encounter: Payer: Self-pay | Admitting: Adult Health

## 2021-08-13 ENCOUNTER — Ambulatory Visit (INDEPENDENT_AMBULATORY_CARE_PROVIDER_SITE_OTHER): Payer: Medicare HMO | Admitting: Adult Health

## 2021-08-13 VITALS — BP 111/73 | HR 53

## 2021-08-13 DIAGNOSIS — I63532 Cerebral infarction due to unspecified occlusion or stenosis of left posterior cerebral artery: Secondary | ICD-10-CM

## 2021-08-13 DIAGNOSIS — Z09 Encounter for follow-up examination after completed treatment for conditions other than malignant neoplasm: Secondary | ICD-10-CM

## 2021-08-13 DIAGNOSIS — I639 Cerebral infarction, unspecified: Secondary | ICD-10-CM

## 2021-08-13 MED ORDER — CLOPIDOGREL BISULFATE 75 MG PO TABS: 75.0000 mg | ORAL_TABLET | Freq: Every day | ORAL | 0 refills | Status: AC

## 2021-08-13 NOTE — Progress Notes (Signed)
I agree with the above plan 

## 2021-08-13 NOTE — Patient Instructions (Addendum)
During hospitalization, neurology recommended aspirin and Brilinta for 30 days then return back to aspirin and Plavix.  30 days has since been completed and recommend transitioning back to aspirin and Plavix which she was on prior to her recent stroke with discontinuing of Brilinta. Will send in prescription for Plavix but ongoing refills will need to be obtained by PCP or vascular surgery  Continue Crestor and Zetia for cholesterol management as currently prescribed  Continue working with therapies for hopeful further recovery  Continue to follow up with PCP regarding cholesterol and blood pressure management  Maintain strict control of hypertension with blood pressure goal below 130/90 and cholesterol with LDL cholesterol (bad cholesterol) goal below 70 mg/dL.   Signs of a Stroke? Follow the BEFAST method:  Balance Watch for a sudden loss of balance, trouble with coordination or vertigo Eyes Is there a sudden loss of vision in one or both eyes? Or double vision?  Face: Ask the person to smile. Does one side of the face droop or is it numb?  Arms: Ask the person to raise both arms. Does one arm drift downward? Is there weakness or numbness of a leg? Speech: Ask the person to repeat a simple phrase. Does the speech sound slurred/strange? Is the person confused ? Time: If you observe any of these signs, call 911.    Followup in the future with me in 4 months or call earlier if needed       Thank you for coming to see [place] at [facility]. I hope we have been able to provide you high quality care today.  You may receive a patient satisfaction survey over the next few weeks. We would appreciate your feedback and comments so that we may continue to improve ourselves and the health of our patients.

## 2021-08-19 DIAGNOSIS — I639 Cerebral infarction, unspecified: Secondary | ICD-10-CM | POA: Diagnosis not present

## 2021-08-19 DIAGNOSIS — F329 Major depressive disorder, single episode, unspecified: Secondary | ICD-10-CM | POA: Diagnosis not present

## 2021-08-19 DIAGNOSIS — I69391 Dysphagia following cerebral infarction: Secondary | ICD-10-CM | POA: Diagnosis not present

## 2021-08-19 DIAGNOSIS — M6281 Muscle weakness (generalized): Secondary | ICD-10-CM | POA: Diagnosis not present

## 2021-08-19 DIAGNOSIS — R471 Dysarthria and anarthria: Secondary | ICD-10-CM | POA: Diagnosis not present

## 2021-08-19 DIAGNOSIS — I69354 Hemiplegia and hemiparesis following cerebral infarction affecting left non-dominant side: Secondary | ICD-10-CM | POA: Diagnosis not present

## 2021-08-19 DIAGNOSIS — R2681 Unsteadiness on feet: Secondary | ICD-10-CM | POA: Diagnosis not present

## 2021-08-21 DIAGNOSIS — G3184 Mild cognitive impairment, so stated: Secondary | ICD-10-CM | POA: Diagnosis not present

## 2021-08-21 DIAGNOSIS — I10 Essential (primary) hypertension: Secondary | ICD-10-CM | POA: Diagnosis not present

## 2021-08-26 ENCOUNTER — Encounter (HOSPITAL_COMMUNITY): Payer: Self-pay | Admitting: *Deleted

## 2021-08-26 ENCOUNTER — Other Ambulatory Visit: Payer: Self-pay

## 2021-08-26 ENCOUNTER — Emergency Department (HOSPITAL_COMMUNITY): Payer: Medicare HMO

## 2021-08-26 ENCOUNTER — Emergency Department (HOSPITAL_COMMUNITY)
Admission: EM | Admit: 2021-08-26 | Discharge: 2021-08-26 | Disposition: A | Discharge status: Home / Self Care | Payer: Medicare HMO | Attending: Emergency Medicine | Admitting: Emergency Medicine

## 2021-08-26 DIAGNOSIS — S0003XA Contusion of scalp, initial encounter: Secondary | ICD-10-CM | POA: Diagnosis not present

## 2021-08-26 DIAGNOSIS — T1490XA Injury, unspecified, initial encounter: Secondary | ICD-10-CM | POA: Diagnosis not present

## 2021-08-26 DIAGNOSIS — F329 Major depressive disorder, single episode, unspecified: Secondary | ICD-10-CM | POA: Diagnosis not present

## 2021-08-26 DIAGNOSIS — W050XXA Fall from non-moving wheelchair, initial encounter: Secondary | ICD-10-CM | POA: Insufficient documentation

## 2021-08-26 DIAGNOSIS — R2689 Other abnormalities of gait and mobility: Secondary | ICD-10-CM | POA: Diagnosis not present

## 2021-08-26 DIAGNOSIS — T148XXA Other injury of unspecified body region, initial encounter: Secondary | ICD-10-CM

## 2021-08-26 DIAGNOSIS — R2681 Unsteadiness on feet: Secondary | ICD-10-CM | POA: Diagnosis not present

## 2021-08-26 DIAGNOSIS — I639 Cerebral infarction, unspecified: Secondary | ICD-10-CM | POA: Diagnosis not present

## 2021-08-26 DIAGNOSIS — W19XXXA Unspecified fall, initial encounter: Secondary | ICD-10-CM | POA: Diagnosis not present

## 2021-08-26 DIAGNOSIS — I69391 Dysphagia following cerebral infarction: Secondary | ICD-10-CM | POA: Diagnosis not present

## 2021-08-26 DIAGNOSIS — S0990XA Unspecified injury of head, initial encounter: Secondary | ICD-10-CM | POA: Diagnosis not present

## 2021-08-26 DIAGNOSIS — Z7982 Long term (current) use of aspirin: Secondary | ICD-10-CM | POA: Diagnosis not present

## 2021-08-26 DIAGNOSIS — W19XXXS Unspecified fall, sequela: Secondary | ICD-10-CM | POA: Diagnosis not present

## 2021-08-26 DIAGNOSIS — R471 Dysarthria and anarthria: Secondary | ICD-10-CM | POA: Diagnosis not present

## 2021-08-26 DIAGNOSIS — M6281 Muscle weakness (generalized): Secondary | ICD-10-CM | POA: Diagnosis not present

## 2021-08-26 DIAGNOSIS — I69354 Hemiplegia and hemiparesis following cerebral infarction affecting left non-dominant side: Secondary | ICD-10-CM | POA: Diagnosis not present

## 2021-08-26 NOTE — ED Notes (Signed)
Pt alert, NAD, calm, interactive, difficult to understand baseline speech, speaks her name, pinpoints pain, follows commands.

## 2021-08-26 NOTE — ED Notes (Signed)
Back to exam room from CT, no changes. Remains alert, NAD, calm, interactive. VSS.

## 2021-08-26 NOTE — ED Notes (Signed)
EDP at BS 
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
## 2021-08-26 NOTE — Discharge Instructions (Addendum)
Your CT imaging of the head and cervical spine was negative for acute traumatic injury

## 2021-08-26 NOTE — Progress Notes (Signed)
Responded to fall on thinner. Pt. Ok . Chaplain will follow as needed.  Venida Jarvis, [place], [facility], Pager [phone]

## 2021-08-26 NOTE — ED Notes (Signed)
Patient transported to CT 

## 2021-08-26 NOTE — ED Notes (Signed)
Resting/ sleeping, NAD, calm. No changes.

## 2021-08-26 NOTE — ED Provider Notes (Signed)
[facility] EMERGENCY DEPARTMENT Provider Note   CSN: [id number] Arrival date & time: 08/26/21  1118     History  No chief complaint on file.   [name] is a 81 y.o. female.  HPI  81 year old female with medical history significant for MDD, PVD, CVA on Brilinta, restrictive lung disease who presents to the emergency department from [facility] as a level 2 trauma after she slid out of her wheelchair and bumped the back of her head sustaining a hematoma to the right posterior/occipital region.  The patient was last seen in neurology clinic on 08/13/2021 for follow-up regarding a large right corona radiata infarct likely embolic secondary to unknown source.  She remains in a skilled nursing facility.  No other injuries were noted by staff who reportedly informed EMS that she was at her baseline mental status.  She arrived GCS 14, ABC intact.  Home Medications Prior to Admission medications   Medication Sig Start Date End Date Taking? Authorizing Provider  acetaminophen (TYLENOL) 160 MG/5ML solution Place 20.3 mLs (650 mg total) into feeding tube every 4 (four) hours as needed for mild pain (or temp > 37.5 C (99.5 F)). 07/07/21   Lonia Blood, MD  amLODipine (NORVASC) 10 MG tablet Place 1 tablet (10 mg total) into feeding tube daily. 07/08/21   Lonia Blood, MD  aspirin 81 MG chewable tablet Place 1 tablet (81 mg total) into feeding tube daily. 07/08/21   Lonia Blood, MD  buPROPion (WELLBUTRIN) 75 MG tablet Place 1 tablet (75 mg total) into feeding tube 2 (two) times daily. 07/08/21   Lonia Blood, MD  clopidogrel (PLAVIX) 75 MG tablet Take 1 tablet (75 mg total) by mouth daily. 08/13/21   Ihor Austin, NP  escitalopram (LEXAPRO) 20 MG tablet Place 1 tablet (20 mg total) into feeding tube daily. 07/08/21   Lonia Blood, MD  ezetimibe (ZETIA) 10 MG tablet Place 1 tablet (10 mg total) into feeding tube daily. 07/08/21   Lonia Blood, MD   fluconazole (DIFLUCAN) 100 MG tablet Place 1 tablet (100 mg total) into feeding tube daily. 07/08/21   Lonia Blood, MD  isosorbide-hydrALAZINE (BIDIL) 20-37.5 MG tablet Place 2 tablets into feeding tube 3 (three) times daily. 07/07/21   Lonia Blood, MD  lisinopril (ZESTRIL) 40 MG tablet Place 1 tablet (40 mg total) into feeding tube daily. 07/08/21   Lonia Blood, MD  magnesium hydroxide (MILK OF MAGNESIA) 400 MG/5ML suspension Place 30 mLs into feeding tube daily as needed for mild constipation. 07/07/21   Lonia Blood, MD  metoprolol tartrate (LOPRESSOR) 25 MG tablet Place 0.5 tablets (12.5 mg total) into feeding tube 2 (two) times daily. 07/07/21   Lonia Blood, MD  Nutritional Supplements (FEEDING SUPPLEMENT, OSMOLITE 1.5 CAL,) LIQD Administer osmolite 1.5 at 50 ml/hr continuously.  Work with dietitian to consolidate to bolus feeding. 07/08/21   Danford, Earl Lites, MD  Nutritional Supplements (FEEDING SUPPLEMENT, PROSOURCE TF,) liquid Place 45 mLs into feeding tube 2 (two) times daily. 07/07/21   Lonia Blood, MD  pantoprazole sodium (PROTONIX) 40 mg Place 40 mg into feeding tube daily. 07/07/21   Lonia Blood, MD  rosuvastatin (CRESTOR) 20 MG tablet Place 1 tablet (20 mg total) into feeding tube daily. 07/08/21   Lonia Blood, MD  senna-docusate (SENOKOT-S) 8.6-50 MG tablet Place 1 tablet into feeding tube at bedtime as needed for mild constipation. 07/07/21   Lonia Blood,  MD  Water For Irrigation, Sterile (FREE WATER) SOLN Place 200 mLs into feeding tube every 6 (six) hours. 07/08/21   Danford, Earl Lites, MD      Allergies    Atorvastatin, Celecoxib, Ezetimibe, Penicillin g, Penicillins, Rosuvastatin, Shellfish allergy, and Zoloft [sertraline]    Review of Systems   Review of Systems  Neurological:  Positive for headaches.    Physical Exam Updated Vital Signs BP (!) 148/70   Pulse (!) 59   Temp 97.7 F (36.5 C) (Oral)   Resp 14    Ht 5\' 3"  (1.6 m)   Wt 61.2 kg   SpO2 100%   BMI 23.91 kg/m  Physical Exam Vitals and nursing note reviewed.  Constitutional:      General: She is not in acute distress.    Appearance: She is well-developed.     Comments: GCS 14, ABC intact  HENT:     Head: Normocephalic.     Comments: Small hematoma to the right occipital scalp Eyes:     Extraocular Movements: Extraocular movements intact.     Conjunctiva/sclera: Conjunctivae normal.     Pupils: Pupils are equal, round, and reactive to light.  Neck:     Comments: No midline tenderness to palpation of the cervical spine.  Range of motion intact Cardiovascular:     Rate and Rhythm: Normal rate and regular rhythm.     Heart sounds: No murmur heard. Pulmonary:     Effort: Pulmonary effort is normal. No respiratory distress.     Breath sounds: Normal breath sounds.  Chest:     Comments: Clavicles stable nontender to AP compression.  Chest wall stable and nontender to AP and lateral compression. Abdominal:     Palpations: Abdomen is soft.     Tenderness: There is no abdominal tenderness.  Musculoskeletal:     Cervical back: Neck supple.     Comments: No midline tenderness to palpation of the thoracic or lumbar spine.  Extremities atraumatic with intact range of motion  Skin:    General: Skin is warm and dry.  Neurological:     Mental Status: She is alert.     Comments: Cranial nerves II through XII grossly intact.  Moving all 4 extremities spontaneously.  Sensation grossly intact all 4 extremities     ED Results / Procedures / Treatments   Labs (all labs ordered are listed, but only abnormal results are displayed) Labs Reviewed - No data to display  EKG None  Radiology CT HEAD WO CONTRAST  Result Date: 08/26/2021 CLINICAL DATA:  Trauma EXAM: CT HEAD WITHOUT CONTRAST CT CERVICAL SPINE WITHOUT CONTRAST TECHNIQUE: Multidetector CT imaging of the head and cervical spine was performed following the standard protocol without  intravenous contrast. Multiplanar CT image reconstructions of the cervical spine were also generated. RADIATION DOSE REDUCTION: This exam was performed according to the departmental dose-optimization program which includes automated exposure control, adjustment of the mA and/or kV according to patient size and/or use of iterative reconstruction technique. COMPARISON:  CT head 06/30/2021, CTA neck 06/25/2018 FINDINGS: CT HEAD FINDINGS Brain: There is no evidence of acute intracranial hemorrhage, extra-axial fluid collection, or acute infarct. Background parenchymal volume is normal for age. The ventricles are stable in size. Remote infarcts in the right occipital lobe, right cerebellar hemisphere, and bilateral basal ganglia are unchanged. Additional confluent hypodensity in the subcortical and periventricular white matter likely reflects sequela of background advanced chronic white matter microangiopathy, unchanged. There is no mass lesion.  There is no  mass effect or midline shift. Vascular: There is calcification of the bilateral cavernous ICAs and vertebral arteries. Skull: Choose Sinuses/Orbits: There is a small retention cyst in the right maxillary sinus. Bilateral lens implants are in place. The globes and orbits are otherwise unremarkable. Other: None. CT CERVICAL SPINE FINDINGS Alignment: There is straightening of the normal cervical lordosis. There is no antero or retrolisthesis. There is no jumped or perched facets or other evidence of traumatic malalignment. Skull base and vertebrae: Skull base alignment is maintained. Vertebral body heights are preserved. There is no evidence of acute fracture. There is ankylosis of the C7-T1 facet joints as well as the facet joints from T2-T3 extending into the thoracic spine bilaterally. Soft tissues and spinal canal: No prevertebral fluid or swelling. No visible canal hematoma. Disc levels: Multilevel disc space narrowing and degenerative endplate changes most  advanced at C5-C6, similar to the prior study. Overall right worse than left facet arthropathy most advanced at C3-C4 and C4-C5 is again noted. Upper chest: The imaged lung apices are clear. Other: None. IMPRESSION: 1. Stable appearance of the brain with no acute intracranial pathology. 2. No acute fracture or traumatic malalignment of the cervical spine. Electronically Signed   By: Lesia Hausen M.D.   On: 08/26/2021 12:35   CT CERVICAL SPINE WO CONTRAST  Result Date: 08/26/2021 CLINICAL DATA:  Trauma EXAM: CT HEAD WITHOUT CONTRAST CT CERVICAL SPINE WITHOUT CONTRAST TECHNIQUE: Multidetector CT imaging of the head and cervical spine was performed following the standard protocol without intravenous contrast. Multiplanar CT image reconstructions of the cervical spine were also generated. RADIATION DOSE REDUCTION: This exam was performed according to the departmental dose-optimization program which includes automated exposure control, adjustment of the mA and/or kV according to patient size and/or use of iterative reconstruction technique. COMPARISON:  CT head 06/30/2021, CTA neck 06/25/2018 FINDINGS: CT HEAD FINDINGS Brain: There is no evidence of acute intracranial hemorrhage, extra-axial fluid collection, or acute infarct. Background parenchymal volume is normal for age. The ventricles are stable in size. Remote infarcts in the right occipital lobe, right cerebellar hemisphere, and bilateral basal ganglia are unchanged. Additional confluent hypodensity in the subcortical and periventricular white matter likely reflects sequela of background advanced chronic white matter microangiopathy, unchanged. There is no mass lesion.  There is no mass effect or midline shift. Vascular: There is calcification of the bilateral cavernous ICAs and vertebral arteries. Skull: Choose Sinuses/Orbits: There is a small retention cyst in the right maxillary sinus. Bilateral lens implants are in place. The globes and orbits are otherwise  unremarkable. Other: None. CT CERVICAL SPINE FINDINGS Alignment: There is straightening of the normal cervical lordosis. There is no antero or retrolisthesis. There is no jumped or perched facets or other evidence of traumatic malalignment. Skull base and vertebrae: Skull base alignment is maintained. Vertebral body heights are preserved. There is no evidence of acute fracture. There is ankylosis of the C7-T1 facet joints as well as the facet joints from T2-T3 extending into the thoracic spine bilaterally. Soft tissues and spinal canal: No prevertebral fluid or swelling. No visible canal hematoma. Disc levels: Multilevel disc space narrowing and degenerative endplate changes most advanced at C5-C6, similar to the prior study. Overall right worse than left facet arthropathy most advanced at C3-C4 and C4-C5 is again noted. Upper chest: The imaged lung apices are clear. Other: None. IMPRESSION: 1. Stable appearance of the brain with no acute intracranial pathology. 2. No acute fracture or traumatic malalignment of the cervical spine. Electronically Signed  By: Lesia Hausen M.D.   On: 08/26/2021 12:35    Procedures Procedures    Medications Ordered in ED Medications - No data to display  ED Course/ Medical Decision Making/ A&P                           Medical Decision Making Amount and/or Complexity of Data Reviewed Radiology: ordered.   81 year old female with medical history significant for MDD, PVD, CVA on Brilinta, restrictive lung disease who presents to the emergency department from [facility] as a level 2 trauma after she slid out of her wheelchair and bumped the back of her head sustaining a hematoma to the right posterior/occipital region.  The patient was last seen in neurology clinic on 08/13/2021 for follow-up regarding a large right corona radiata infarct likely embolic secondary to unknown source.  She remains in a skilled nursing facility.  No other injuries were noted by staff who  reportedly informed EMS that she was at her baseline mental status.  She arrived GCS 14, ABC intact.  Patient with a small hematoma to the right occipital scalp. CT Imaging of the head and cervical spine performed without acute injury noted. The patient was reported at her baseline per facility staff. Only sign of traumatic injury is the hematoma, no other sign of injury on primary or secondary survey. Stable for DC back to her SNF.    Final Clinical Impression(s) / ED Diagnoses Final diagnoses:  Fall, initial encounter  Hematoma    Rx / DC Orders ED Discharge Orders     None         Ernie Avena, MD 08/26/21 1256

## 2021-08-26 NOTE — Progress Notes (Signed)
Orthopedic Tech Progress Note Patient Details:  [name] May 13, 1940 [id number]  Level 2 trauma   Patient ID: [name], female   DOB: 21-Jun-1940, 81 y.o.   MRN: [id number]  [name] 08/26/2021, 11:22 AM

## 2021-08-27 DIAGNOSIS — R471 Dysarthria and anarthria: Secondary | ICD-10-CM | POA: Diagnosis not present

## 2021-08-27 DIAGNOSIS — M6281 Muscle weakness (generalized): Secondary | ICD-10-CM | POA: Diagnosis not present

## 2021-08-27 DIAGNOSIS — R41841 Cognitive communication deficit: Secondary | ICD-10-CM | POA: Diagnosis not present

## 2021-08-27 DIAGNOSIS — I639 Cerebral infarction, unspecified: Secondary | ICD-10-CM | POA: Diagnosis not present

## 2021-09-01 DIAGNOSIS — W19XXXD Unspecified fall, subsequent encounter: Secondary | ICD-10-CM | POA: Diagnosis not present

## 2021-09-01 DIAGNOSIS — R262 Difficulty in walking, not elsewhere classified: Secondary | ICD-10-CM | POA: Diagnosis not present

## 2021-09-10 DIAGNOSIS — F413 Other mixed anxiety disorders: Secondary | ICD-10-CM | POA: Diagnosis not present

## 2021-09-10 DIAGNOSIS — F331 Major depressive disorder, recurrent, moderate: Secondary | ICD-10-CM | POA: Diagnosis not present

## 2021-09-10 DIAGNOSIS — F4320 Adjustment disorder, unspecified: Secondary | ICD-10-CM | POA: Diagnosis not present

## 2021-09-21 DIAGNOSIS — G3184 Mild cognitive impairment, so stated: Secondary | ICD-10-CM | POA: Diagnosis not present

## 2021-09-21 DIAGNOSIS — I10 Essential (primary) hypertension: Secondary | ICD-10-CM | POA: Diagnosis not present

## 2021-09-25 DIAGNOSIS — R609 Edema, unspecified: Secondary | ICD-10-CM | POA: Diagnosis not present

## 2021-10-06 DIAGNOSIS — M6281 Muscle weakness (generalized): Secondary | ICD-10-CM | POA: Diagnosis not present

## 2021-10-06 DIAGNOSIS — I69354 Hemiplegia and hemiparesis following cerebral infarction affecting left non-dominant side: Secondary | ICD-10-CM | POA: Diagnosis not present

## 2021-10-06 DIAGNOSIS — R41841 Cognitive communication deficit: Secondary | ICD-10-CM | POA: Diagnosis not present

## 2021-10-06 DIAGNOSIS — I639 Cerebral infarction, unspecified: Secondary | ICD-10-CM | POA: Diagnosis not present

## 2021-10-10 ENCOUNTER — Emergency Department (HOSPITAL_COMMUNITY): Payer: Medicare HMO

## 2021-10-10 ENCOUNTER — Emergency Department (HOSPITAL_COMMUNITY)
Admission: EM | Admit: 2021-10-10 | Discharge: 2021-10-23 | Disposition: E | Payer: Medicare HMO | Attending: Emergency Medicine | Admitting: Emergency Medicine

## 2021-10-10 DIAGNOSIS — A419 Sepsis, unspecified organism: Secondary | ICD-10-CM | POA: Diagnosis not present

## 2021-10-10 DIAGNOSIS — Z7982 Long term (current) use of aspirin: Secondary | ICD-10-CM | POA: Diagnosis not present

## 2021-10-10 DIAGNOSIS — R509 Fever, unspecified: Secondary | ICD-10-CM | POA: Diagnosis not present

## 2021-10-10 DIAGNOSIS — D649 Anemia, unspecified: Secondary | ICD-10-CM | POA: Insufficient documentation

## 2021-10-10 DIAGNOSIS — R000 Tachycardia, unspecified: Secondary | ICD-10-CM | POA: Diagnosis not present

## 2021-10-10 DIAGNOSIS — I639 Cerebral infarction, unspecified: Secondary | ICD-10-CM | POA: Diagnosis not present

## 2021-10-10 DIAGNOSIS — G9340 Encephalopathy, unspecified: Secondary | ICD-10-CM | POA: Insufficient documentation

## 2021-10-10 DIAGNOSIS — R6520 Severe sepsis without septic shock: Secondary | ICD-10-CM | POA: Diagnosis not present

## 2021-10-10 DIAGNOSIS — R5381 Other malaise: Secondary | ICD-10-CM | POA: Diagnosis not present

## 2021-10-10 LAB — CBG MONITORING, ED: Glucose-Capillary: 101 mg/dL — ABNORMAL HIGH (ref 70–99)

## 2021-10-10 NOTE — ED Triage Notes (Signed)
Pt bib [facility] from [facility] after facility reported pt was "feeling sick" and not being herself since last night. Pt initially febrile, given tylenol by facility, temp 98.9 per EMS. Pt tachypneic, unable to follow commands, normally verbal at baseline. Per facility, pt RBCs low.

## 2021-10-10 NOTE — ED Provider Notes (Signed)
[facility] EMERGENCY DEPARTMENT Provider Note   CSN: [id number] Arrival date & time: 15-Oct-2021  2336     History {Add pertinent medical, surgical, social history, OB history to HPI:1} Chief Complaint  Patient presents with   Fever    [name] is a 81 y.o. female.  The history is provided by the EMS personnel and medical records.  Fever [name] is a 81 y.o. female who presents to the Emergency Department complaining of abnormal lab.  She presents to the ED from [facility] for evaluation of low red blood cells.  EMS report tactile temp on their arrival.  Baseline mental status unknown.         Home Medications Prior to Admission medications   Medication Sig Start Date End Date Taking? Authorizing Provider  acetaminophen (TYLENOL) 160 MG/5ML solution Place 20.3 mLs (650 mg total) into feeding tube every 4 (four) hours as needed for mild pain (or temp > 37.5 C (99.5 F)). 07/07/21   Lonia Blood, MD  amLODipine (NORVASC) 10 MG tablet Place 1 tablet (10 mg total) into feeding tube daily. 07/08/21   Lonia Blood, MD  aspirin 81 MG chewable tablet Place 1 tablet (81 mg total) into feeding tube daily. 07/08/21   Lonia Blood, MD  buPROPion (WELLBUTRIN) 75 MG tablet Place 1 tablet (75 mg total) into feeding tube 2 (two) times daily. 07/08/21   Lonia Blood, MD  clopidogrel (PLAVIX) 75 MG tablet Take 1 tablet (75 mg total) by mouth daily. 08/13/21   Ihor Austin, NP  escitalopram (LEXAPRO) 20 MG tablet Place 1 tablet (20 mg total) into feeding tube daily. 07/08/21   Lonia Blood, MD  ezetimibe (ZETIA) 10 MG tablet Place 1 tablet (10 mg total) into feeding tube daily. 07/08/21   Lonia Blood, MD  fluconazole (DIFLUCAN) 100 MG tablet Place 1 tablet (100 mg total) into feeding tube daily. 07/08/21   Lonia Blood, MD  isosorbide-hydrALAZINE (BIDIL) 20-37.5 MG tablet Place 2 tablets into feeding tube 3 (three) times  daily. 07/07/21   Lonia Blood, MD  lisinopril (ZESTRIL) 40 MG tablet Place 1 tablet (40 mg total) into feeding tube daily. 07/08/21   Lonia Blood, MD  magnesium hydroxide (MILK OF MAGNESIA) 400 MG/5ML suspension Place 30 mLs into feeding tube daily as needed for mild constipation. 07/07/21   Lonia Blood, MD  metoprolol tartrate (LOPRESSOR) 25 MG tablet Place 0.5 tablets (12.5 mg total) into feeding tube 2 (two) times daily. 07/07/21   Lonia Blood, MD  Nutritional Supplements (FEEDING SUPPLEMENT, OSMOLITE 1.5 CAL,) LIQD Administer osmolite 1.5 at 50 ml/hr continuously.  Work with dietitian to consolidate to bolus feeding. 07/08/21   Danford, Earl Lites, MD  Nutritional Supplements (FEEDING SUPPLEMENT, PROSOURCE TF,) liquid Place 45 mLs into feeding tube 2 (two) times daily. 07/07/21   Lonia Blood, MD  pantoprazole sodium (PROTONIX) 40 mg Place 40 mg into feeding tube daily. 07/07/21   Lonia Blood, MD  rosuvastatin (CRESTOR) 20 MG tablet Place 1 tablet (20 mg total) into feeding tube daily. 07/08/21   Lonia Blood, MD  senna-docusate (SENOKOT-S) 8.6-50 MG tablet Place 1 tablet into feeding tube at bedtime as needed for mild constipation. 07/07/21   Lonia Blood, MD  Water For Irrigation, Sterile (FREE WATER) SOLN Place 200 mLs into feeding tube every 6 (six) hours. 07/08/21   Danford, Earl Lites, MD  Allergies    Atorvastatin, Celecoxib, Ezetimibe, Penicillin g, Penicillins, Rosuvastatin, Shellfish allergy, and Zoloft [sertraline]    Review of Systems   Review of Systems  Constitutional:  Positive for fever.  All other systems reviewed and are negative.   Physical Exam Updated Vital Signs BP 125/66   Temp 100.2 F (37.9 C) (Rectal)   Resp (!) 23  Physical Exam  ED Results / Procedures / Treatments   Labs (all labs ordered are listed, but only abnormal results are displayed) Labs Reviewed  CBG MONITORING, ED - Abnormal; Notable for  the following components:      Result Value   Glucose-Capillary 101 (*)    All other components within normal limits  SARS CORONAVIRUS 2 BY RT PCR  CULTURE, BLOOD (ROUTINE X 2)  CULTURE, BLOOD (ROUTINE X 2)  URINALYSIS, ROUTINE W REFLEX MICROSCOPIC  BASIC METABOLIC PANEL  LACTIC ACID, PLASMA  LACTIC ACID, PLASMA  CBC WITH DIFFERENTIAL/PLATELET  PROTIME-INR    EKG None  Radiology No results found.  Procedures Procedures  {Document cardiac monitor, telemetry assessment procedure when appropriate:1}  Medications Ordered in ED Medications - No data to display  ED Course/ Medical Decision Making/ A&P                           Medical Decision Making Amount and/or Complexity of Data Reviewed Labs: ordered. Radiology: ordered.   ***  {Document critical care time when appropriate:1} {Document review of labs and clinical decision tools ie heart score, Chads2Vasc2 etc:1}  {Document your independent review of radiology images, and any outside records:1} {Document your discussion with family members, caretakers, and with consultants:1} {Document social determinants of health affecting pt's care:1} {Document your decision making why or why not admission, treatments were needed:1} Final Clinical Impression(s) / ED Diagnoses Final diagnoses:  None    Rx / DC Orders ED Discharge Orders     None

## 2021-10-11 ENCOUNTER — Emergency Department (HOSPITAL_COMMUNITY): Payer: Medicare HMO

## 2021-10-11 DIAGNOSIS — R509 Fever, unspecified: Secondary | ICD-10-CM | POA: Diagnosis not present

## 2021-10-11 LAB — CBC WITH DIFFERENTIAL/PLATELET
Abs Immature Granulocytes: 0.05 10*3/uL (ref 0.00–0.07)
Basophils Absolute: 0 10*3/uL (ref 0.0–0.1)
Basophils Relative: 0 %
Eosinophils Absolute: 0 10*3/uL (ref 0.0–0.5)
Eosinophils Relative: 0 %
HCT: 13.2 % — ABNORMAL LOW (ref 36.0–46.0)
Hemoglobin: 3.3 g/dL — CL (ref 12.0–15.0)
Immature Granulocytes: 1 %
Lymphocytes Relative: 8 %
Lymphs Abs: 0.6 10*3/uL — ABNORMAL LOW (ref 0.7–4.0)
MCH: 16.5 pg — ABNORMAL LOW (ref 26.0–34.0)
MCHC: 25 g/dL — ABNORMAL LOW (ref 30.0–36.0)
MCV: 66 fL — ABNORMAL LOW (ref 80.0–100.0)
Monocytes Absolute: 0.9 10*3/uL (ref 0.1–1.0)
Monocytes Relative: 11 %
Neutro Abs: 6.6 10*3/uL (ref 1.7–7.7)
Neutrophils Relative %: 80 %
Platelets: 367 10*3/uL (ref 150–400)
RBC: 2 MIL/uL — ABNORMAL LOW (ref 3.87–5.11)
RDW: 21.4 % — ABNORMAL HIGH (ref 11.5–15.5)
WBC: 8.2 10*3/uL (ref 4.0–10.5)
nRBC: 1.6 % — ABNORMAL HIGH (ref 0.0–0.2)

## 2021-10-11 LAB — COMPREHENSIVE METABOLIC PANEL
ALT: 13 U/L (ref 0–44)
AST: 38 U/L (ref 15–41)
Albumin: 3.2 g/dL — ABNORMAL LOW (ref 3.5–5.0)
Alkaline Phosphatase: 43 U/L (ref 38–126)
Anion gap: 9 (ref 5–15)
BUN: 20 mg/dL (ref 8–23)
CO2: 21 mmol/L — ABNORMAL LOW (ref 22–32)
Calcium: 8.8 mg/dL — ABNORMAL LOW (ref 8.9–10.3)
Chloride: 107 mmol/L (ref 98–111)
Creatinine, Ser: 0.94 mg/dL (ref 0.44–1.00)
GFR, Estimated: >60 mL/min (ref >60)
Glucose, Bld: 102 mg/dL — ABNORMAL HIGH (ref 70–99)
Potassium: 4.3 mmol/L (ref 3.5–5.1)
Sodium: 137 mmol/L (ref 135–145)
Total Bilirubin: 1 mg/dL (ref 0.3–1.2)
Total Protein: 5.6 g/dL — ABNORMAL LOW (ref 6.5–8.1)

## 2021-10-11 LAB — URINALYSIS, ROUTINE W REFLEX MICROSCOPIC
Bilirubin Urine: NEGATIVE
Glucose, UA: NEGATIVE mg/dL
Hgb urine dipstick: NEGATIVE
Ketones, ur: NEGATIVE mg/dL
Nitrite: NEGATIVE
Protein, ur: NEGATIVE mg/dL
Specific Gravity, Urine: 1.02 (ref 1.005–1.030)
pH: 6 (ref 5.0–8.0)

## 2021-10-11 LAB — URINALYSIS, MICROSCOPIC (REFLEX)

## 2021-10-11 LAB — LACTIC ACID, PLASMA: Lactic Acid, Venous: 3.3 mmol/L (ref 0.5–1.9)

## 2021-10-11 LAB — TYPE AND SCREEN
ABO/RH(D): O POS
Antibody Screen: NEGATIVE

## 2021-10-11 LAB — PREPARE RBC (CROSSMATCH)

## 2021-10-11 LAB — PROTIME-INR
INR: 1.3 — ABNORMAL HIGH (ref 0.8–1.2)
Prothrombin Time: 16.3 seconds — ABNORMAL HIGH (ref 11.4–15.2)

## 2021-10-11 MED ORDER — SODIUM CHLORIDE 0.9 % IV BOLUS
500.0000 mL | Freq: Once | INTRAVENOUS | Status: AC
  Administered 2021-10-11: 500 mL via INTRAVENOUS

## 2021-10-11 MED ORDER — SODIUM CHLORIDE 0.9% IV SOLUTION: Freq: Once | INTRAVENOUS | Status: DC

## 2021-10-11 MED ORDER — VANCOMYCIN HCL IN DEXTROSE 1-5 GM/200ML-% IV SOLN
1000.0000 mg | Freq: Once | INTRAVENOUS | Status: DC
  Filled 2021-10-11: qty 200

## 2021-10-11 MED ORDER — LACTATED RINGERS IV SOLN: INTRAVENOUS | Status: DC

## 2021-10-11 MED ORDER — VANCOMYCIN HCL 1250 MG/250ML IV SOLN
1250.0000 mg | Freq: Once | INTRAVENOUS | Status: DC
  Filled 2021-10-11: qty 250

## 2021-10-11 MED ORDER — SODIUM CHLORIDE 0.9 % IV SOLN
2.0000 g | Freq: Once | INTRAVENOUS | Status: DC
  Filled 2021-10-11: qty 12.5

## 2021-10-11 MED ORDER — SODIUM CHLORIDE 0.9 % IV SOLN: 2.0000 g | Freq: Two times a day (BID) | INTRAVENOUS | Status: DC

## 2021-10-11 MED ORDER — ACETAMINOPHEN 650 MG RE SUPP
650.0000 mg | Freq: Once | RECTAL | Status: DC
  Filled 2021-10-11: qty 1

## 2021-10-11 MED ORDER — METRONIDAZOLE 500 MG/100ML IV SOLN
500.0000 mg | Freq: Once | INTRAVENOUS | Status: AC
Start: 2021-10-11 — End: 2021-10-11
  Administered 2021-10-11: 500 mg via INTRAVENOUS
  Filled 2021-10-11: qty 100

## 2021-10-11 MED ORDER — VANCOMYCIN HCL 750 MG/150ML IV SOLN: 750.0000 mg | INTRAVENOUS | Status: DC

## 2021-10-13 LAB — URINE CULTURE: Culture: >=100000 — AB

## 2021-10-16 LAB — CULTURE, BLOOD (ROUTINE X 2)
Culture: NO GROWTH
Special Requests: ADEQUATE

## 2021-10-23 NOTE — ED Notes (Signed)
This RN and Alexa NT went into pts room for RN to administer tylenol suppository. Upon repositioning pt, this RN noted pt had bowel movement. Pt also noted to have agonal breathing. Aprox 30 seconds later, monitor showed asystole. This RN and Alexa NT checked for carotid and femoral pulses, none present. Pt DNR. Madilyn Hook MD made aware and was at bedside to confirm via ultrasound.

## 2021-10-23 NOTE — ED Notes (Signed)
Pt bladder scanned, . Order for in and out cath placed

## 2021-10-23 NOTE — ED Provider Notes (Incomplete)
[facility] EMERGENCY DEPARTMENT Provider Note   CSN: [id number] Arrival date & time: 10/23/21  2336     History {Add pertinent medical, surgical, social history, OB history to HPI:1} Chief Complaint  Patient presents with  . Fever    [name] is a 81 y.o. female.  The history is provided by the EMS personnel and medical records.  Fever [name] is a 81 y.o. female who presents to the Emergency Department complaining of abnormal lab.  She presents to the ED from [facility] for evaluation of low red blood cells.  EMS report tactile temp on their arrival.  Baseline mental status unknown.    Fever last night around midnight.  Stat labs cbc and bmp - 7am.   Fever to 101 yest      Home Medications Prior to Admission medications   Medication Sig Start Date End Date Taking? Authorizing Provider  acetaminophen (TYLENOL) 160 MG/5ML solution Place 20.3 mLs (650 mg total) into feeding tube every 4 (four) hours as needed for mild pain (or temp > 37.5 C (99.5 F)). 07/07/21   Lonia Blood, MD  amLODipine (NORVASC) 10 MG tablet Place 1 tablet (10 mg total) into feeding tube daily. 07/08/21   Lonia Blood, MD  aspirin 81 MG chewable tablet Place 1 tablet (81 mg total) into feeding tube daily. 07/08/21   Lonia Blood, MD  buPROPion (WELLBUTRIN) 75 MG tablet Place 1 tablet (75 mg total) into feeding tube 2 (two) times daily. 07/08/21   Lonia Blood, MD  clopidogrel (PLAVIX) 75 MG tablet Take 1 tablet (75 mg total) by mouth daily. 08/13/21   Ihor Austin, NP  escitalopram (LEXAPRO) 20 MG tablet Place 1 tablet (20 mg total) into feeding tube daily. 07/08/21   Lonia Blood, MD  ezetimibe (ZETIA) 10 MG tablet Place 1 tablet (10 mg total) into feeding tube daily. 07/08/21   Lonia Blood, MD  fluconazole (DIFLUCAN) 100 MG tablet Place 1 tablet (100 mg total) into feeding tube daily. 07/08/21   Lonia Blood, MD   isosorbide-hydrALAZINE (BIDIL) 20-37.5 MG tablet Place 2 tablets into feeding tube 3 (three) times daily. 07/07/21   Lonia Blood, MD  lisinopril (ZESTRIL) 40 MG tablet Place 1 tablet (40 mg total) into feeding tube daily. 07/08/21   Lonia Blood, MD  magnesium hydroxide (MILK OF MAGNESIA) 400 MG/5ML suspension Place 30 mLs into feeding tube daily as needed for mild constipation. 07/07/21   Lonia Blood, MD  metoprolol tartrate (LOPRESSOR) 25 MG tablet Place 0.5 tablets (12.5 mg total) into feeding tube 2 (two) times daily. 07/07/21   Lonia Blood, MD  Nutritional Supplements (FEEDING SUPPLEMENT, OSMOLITE 1.5 CAL,) LIQD Administer osmolite 1.5 at 50 ml/hr continuously.  Work with dietitian to consolidate to bolus feeding. 07/08/21   Danford, Earl Lites, MD  Nutritional Supplements (FEEDING SUPPLEMENT, PROSOURCE TF,) liquid Place 45 mLs into feeding tube 2 (two) times daily. 07/07/21   Lonia Blood, MD  pantoprazole sodium (PROTONIX) 40 mg Place 40 mg into feeding tube daily. 07/07/21   Lonia Blood, MD  rosuvastatin (CRESTOR) 20 MG tablet Place 1 tablet (20 mg total) into feeding tube daily. 07/08/21   Lonia Blood, MD  senna-docusate (SENOKOT-S) 8.6-50 MG tablet Place 1 tablet into feeding tube at bedtime as needed for mild constipation. 07/07/21   Lonia Blood, MD  Water For Irrigation, Sterile (FREE WATER) SOLN Place 200 mLs  into feeding tube every 6 (six) hours. 07/08/21   Danford, Earl Lites, MD      Allergies    Atorvastatin, Celecoxib, Ezetimibe, Penicillin g, Penicillins, Rosuvastatin, Shellfish allergy, and Zoloft [sertraline]    Review of Systems   Review of Systems  Constitutional:  Positive for fever.  All other systems reviewed and are negative.   Physical Exam Updated Vital Signs BP 125/66   Temp 100.2 F (37.9 C) (Rectal)   Resp (!) 23  Physical Exam  ED Results / Procedures / Treatments   Labs (all labs ordered are listed,  but only abnormal results are displayed) Labs Reviewed  CBG MONITORING, ED - Abnormal; Notable for the following components:      Result Value   Glucose-Capillary 101 (*)    All other components within normal limits  SARS CORONAVIRUS 2 BY RT PCR  CULTURE, BLOOD (ROUTINE X 2)  CULTURE, BLOOD (ROUTINE X 2)  URINALYSIS, ROUTINE W REFLEX MICROSCOPIC  BASIC METABOLIC PANEL  LACTIC ACID, PLASMA  LACTIC ACID, PLASMA  CBC WITH DIFFERENTIAL/PLATELET  PROTIME-INR    EKG None  Radiology No results found.  Procedures Procedures  {Document cardiac monitor, telemetry assessment procedure when appropriate:1}  Medications Ordered in ED Medications - No data to display  ED Course/ Medical Decision Making/ A&P                           Medical Decision Making Amount and/or Complexity of Data Reviewed Labs: ordered. Radiology: ordered.   ***  {Document critical care time when appropriate:1} {Document review of labs and clinical decision tools ie heart score, Chads2Vasc2 etc:1}  {Document your independent review of radiology images, and any outside records:1} {Document your discussion with family members, caretakers, and with consultants:1} {Document social determinants of health affecting pt's care:1} {Document your decision making why or why not admission, treatments were needed:1} Final Clinical Impression(s) / ED Diagnoses Final diagnoses:  None    Rx / DC Orders ED Discharge Orders     None

## 2021-10-23 NOTE — Care Plan (Addendum)
Attempted to get pt. For CT, RN and Staff in Room, RN to call CT when pt. Ready 2:15 attempted to get pt. Again and pt. Has passed

## 2021-10-23 NOTE — Progress Notes (Signed)
Pt being followed by [facility] for Sepsis protocol. 

## 2021-10-23 NOTE — Progress Notes (Signed)
Chaplain responded to request to reach out ot pt's family members following the doctor's notification of death.  Chaplain spoke with pt's sister.  Family members will be coming to the hospital within the hour.  Chaplain awaiting their arrival.  Vernell Morgans Chaplain

## 2021-10-23 NOTE — ED Notes (Signed)
Pt responsive to painful stimuli

## 2021-10-23 NOTE — Progress Notes (Signed)
Pharmacy Antibiotic Note  [name] is a 81 y.o. female admitted on 10/16/2021 with sepsis from [facility].  Pharmacy has been consulted for Vancomycin and Cefepime dosing.  Plan: Cefepime 2gm IV q12h x 7days Vancomycin 1250mg  IV now then 750 mg IV Q 24 hrs x 7 days. Goal AUC 400-550.Expected AUC: 450 SCr used: 0.94 Will f/u renal function, micro data, and pt's clinical condition Vanc levels prn   Weight: 61.2 kg (134 lb 14.7 oz)  Temp (24hrs), Avg:100.2 F (37.9 C), Min:100.2 F (37.9 C), Max:100.2 F (37.9 C)  Recent Labs  Lab 09/23/2021 2345  WBC 8.2  CREATININE 0.94  LATICACIDVEN 3.3*    Estimated Creatinine Clearance: 40.1 mL/min (by C-G formula based on SCr of 0.94 mg/dL).    Allergies  Allergen Reactions   Atorvastatin     Other reaction(s): myalgias   Celecoxib     Other reaction(s): chest pain   Ezetimibe     Other reaction(s): GI side effects   Penicillin G     Other reaction(s): swelling   Penicillins Swelling and Other (See Comments)    Has patient had a PCN reaction causing immediate rash, facial/tongue/throat swelling, SOB or lightheadedness with hypotension: Yes Has patient had a PCN reaction causing severe rash involving mucus membranes or skin necrosis: No Has patient had a PCN reaction that required hospitalization: No Has patient had a PCN reaction occurring within the last 10 years: No If all of the above answers are "NO", then may proceed with Cephalosporin use.    Rosuvastatin     Other reaction(s): myalgias   Shellfish Allergy Itching and Swelling   Zoloft [Sertraline] Rash    Antimicrobials this admission: 8/20 Vanc >> 8/26 8/20 Cefepime >> 8/26 8/20 Flagyl x 1  Microbiology results: 8/19 BCx:   UCx:    Thank you for allowing pharmacy to be a part of this patient's care.  9/19, PharmD, BCPS Please see amion for complete clinical pharmacist phone list 10/12/2021 1:04 AM

## 2021-10-23 NOTE — ED Notes (Signed)
Pts DNR and facility facesheet placed in medical records drawer

## 2021-10-23 DEATH — deceased

## 2021-12-14 ENCOUNTER — Ambulatory Visit: Payer: Medicare HMO | Admitting: Adult Health

## 2023-05-06 IMAGING — CT CT HEAD W/O CM
3 series · 14 of 47 positions shown, 16 images · non-contrast
Comparison: 06/24/2021

CLINICAL DATA: Altered mental status



[Series 4: head 5.0 mpr ax · axial · 0.32mm/px · z∈[-136,+1]mm · 8 of 34 slices shown, 10 images]
[im 3/34  brain]
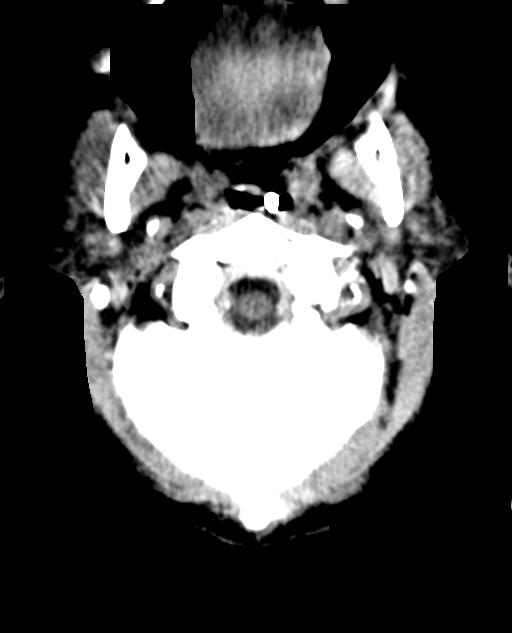
[im 3/34  bone]
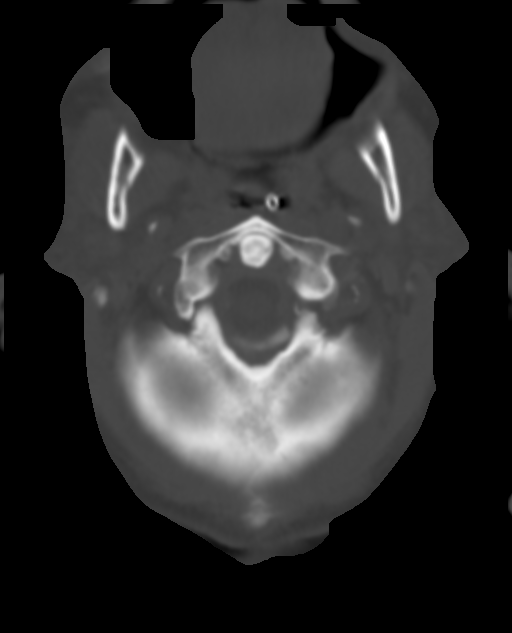
[im 7/34  brain]
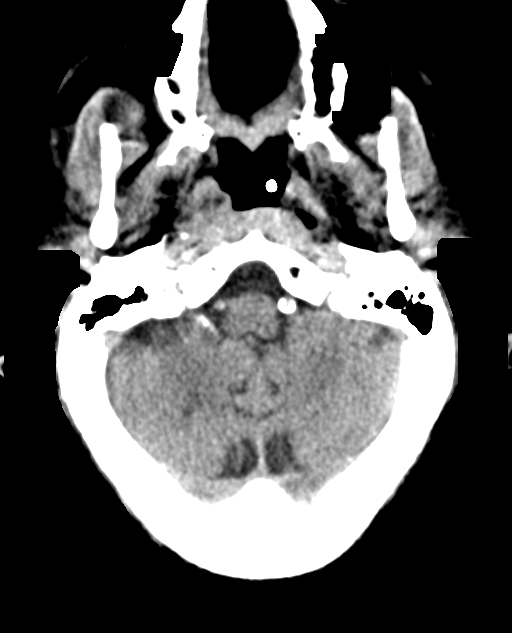
[im 11/34  brain]
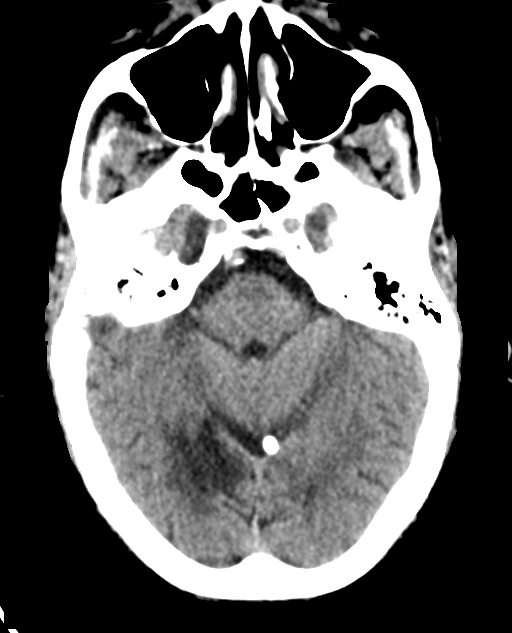
[im 15/34  brain]
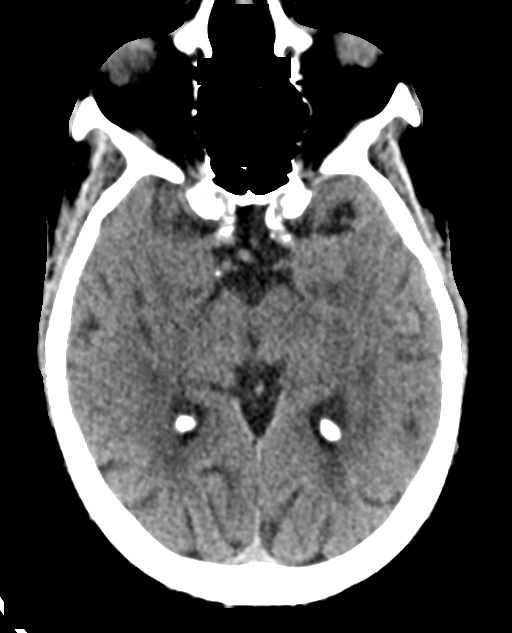
[im 19/34  brain]
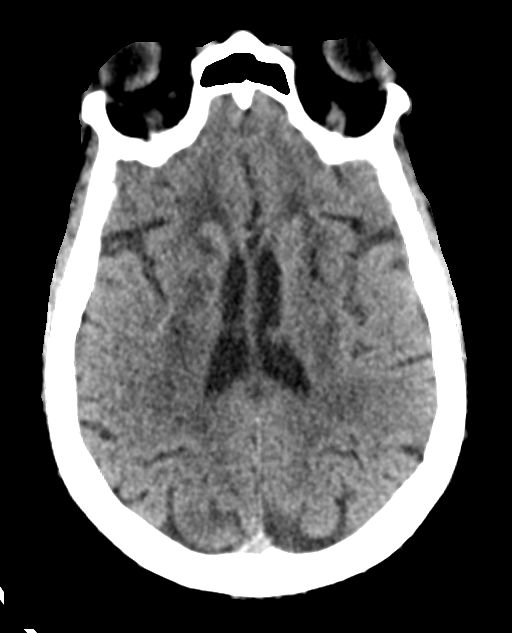
[im 19/34  bone]
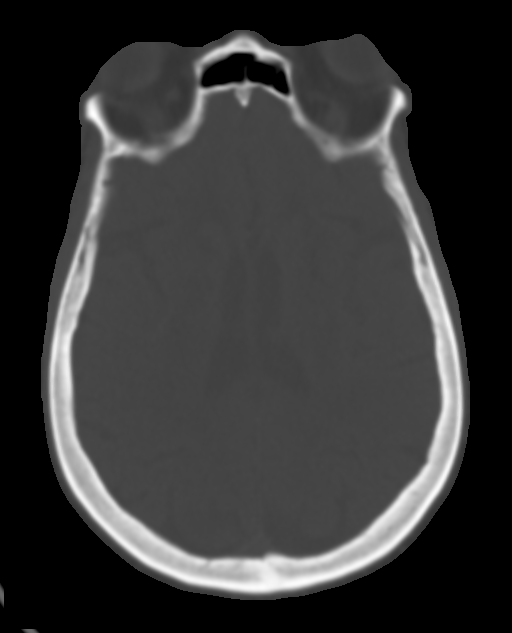
[im 23/34  brain]
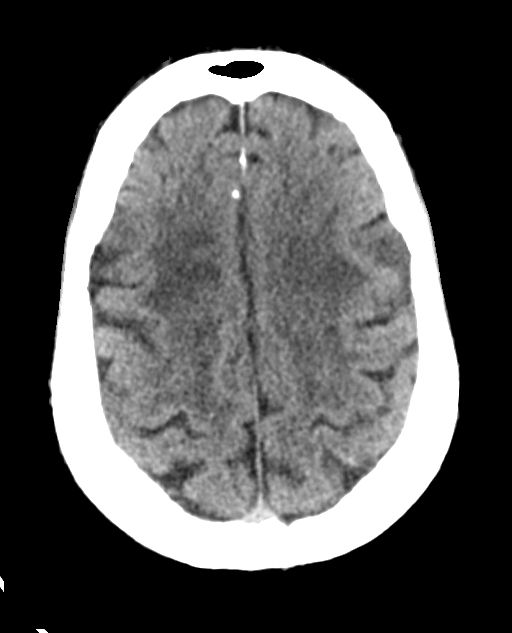
[im 27/34  brain]
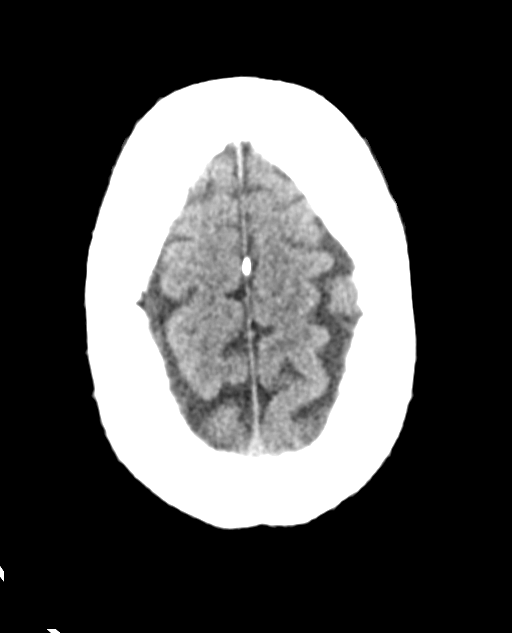
[im 31/34  brain]
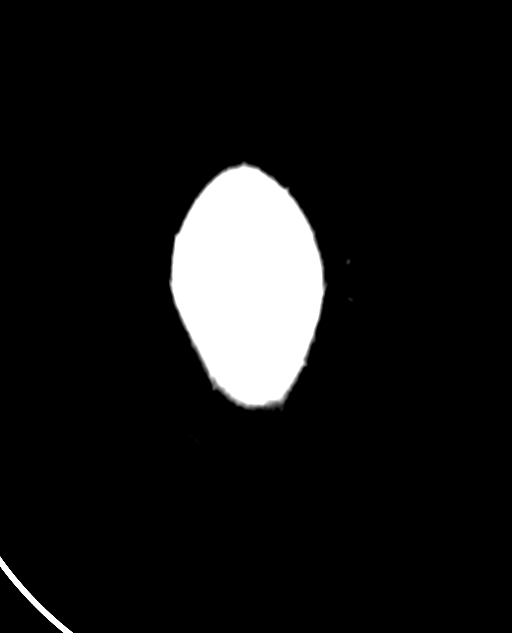

[Series 6: head 3.0 mpr cor · coronal · 0.32mm/px · 3 of 67 slices shown]
[im 23/67  brain]
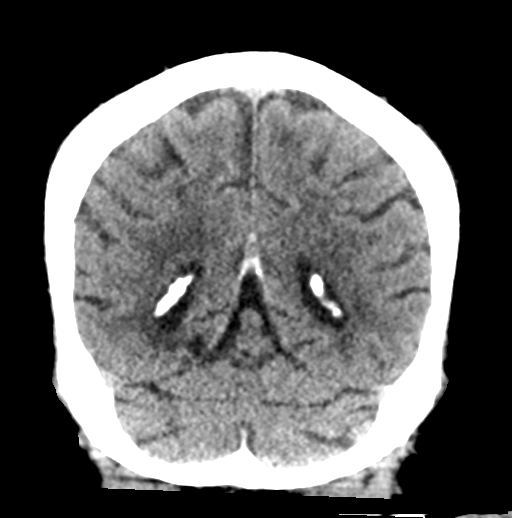
[im 30/67  brain]
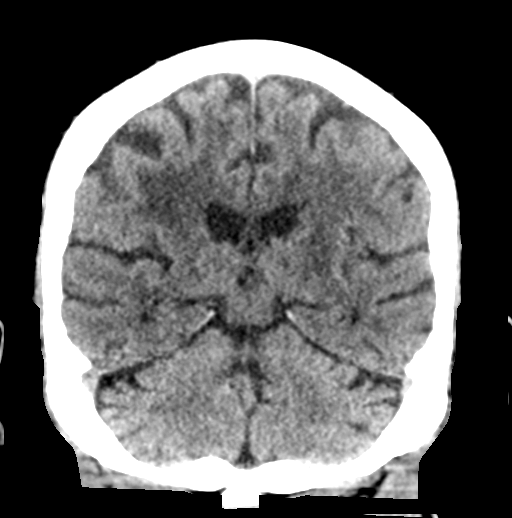
[im 37/67  brain]
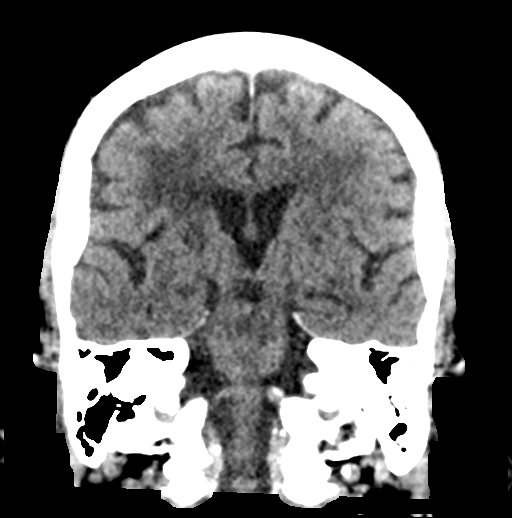

[Series 7: head 3.0 mpr sag · sagittal · 0.32mm/px · 3 of 55 slices shown]
[im 19/55  brain]
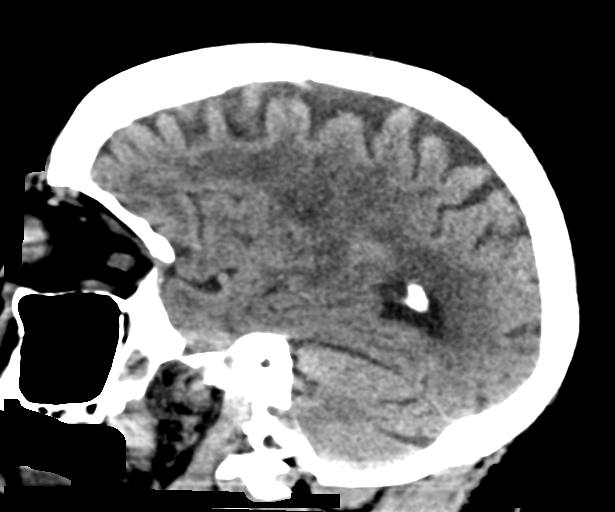
[im 28/55  brain]
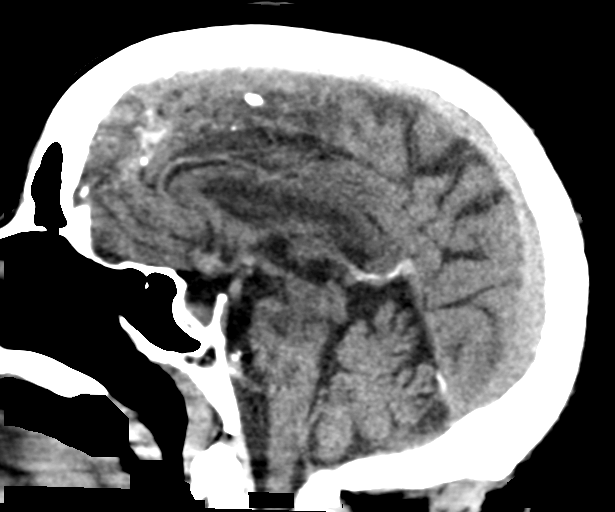
[im 37/55  brain]
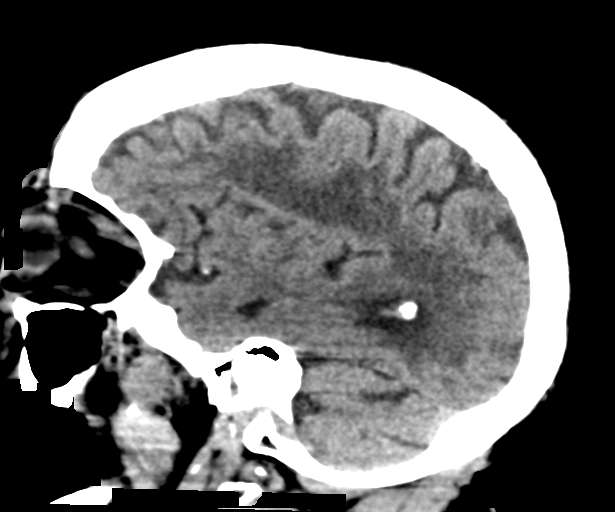

[14 of 47 positions shown; findings below may reference images not displayed]

FINDINGS: Brain: Normal anatomic configuration. Parenchymal volume loss is
commensurate with the patient's age. Moderate periventricular white
matter changes are present likely reflecting the sequela of small
vessel ischemia. Numerous remote lacunar infarcts are noted within
the periventricular white matter, basal ganglia, caudate nuclei, and
thalami bilaterally, as well as the right pons, stable since prior
examination. Remote cortical infarct within the right occipital lobe
again noted. No abnormal intra or extra-axial mass lesion or fluid
collection. No abnormal mass effect or midline shift. No evidence of
acute intracranial hemorrhage or infarct. Ventricular size is
normal. Cerebellum unremarkable.

Vascular: No asymmetric hyperdense vasculature at the skull base.

Skull: Intact

Sinuses/Orbits: Paranasal sinuses are clear. Orbits are
unremarkable.

Other: Mastoid air cells and middle ear cavities are clear.
IMPRESSION: No acute intracranial abnormality.

Moderate senescent change, stable since prior examination.

Numerous remote lacunar infarcts and right occipital cortical
infarct, stable since prior examination.
